# Patient Record
Sex: Male | Born: 1950 | Race: White | Hispanic: No | Marital: Married | Smoking: Former smoker
Health system: Southern US, Community
[De-identification: ages and names within clinical notes are randomized; demographics above are authoritative.]

## PROBLEM LIST (undated history)

## (undated) DIAGNOSIS — C4492 Squamous cell carcinoma of skin, unspecified: Secondary | ICD-10-CM

## (undated) DIAGNOSIS — K2210 Ulcer of esophagus without bleeding: Secondary | ICD-10-CM

## (undated) DIAGNOSIS — M1990 Unspecified osteoarthritis, unspecified site: Secondary | ICD-10-CM

## (undated) DIAGNOSIS — K5790 Diverticulosis of intestine, part unspecified, without perforation or abscess without bleeding: Secondary | ICD-10-CM

## (undated) DIAGNOSIS — I440 Atrioventricular block, first degree: Secondary | ICD-10-CM

## (undated) DIAGNOSIS — E785 Hyperlipidemia, unspecified: Secondary | ICD-10-CM

## (undated) DIAGNOSIS — G4730 Sleep apnea, unspecified: Secondary | ICD-10-CM

## (undated) DIAGNOSIS — K219 Gastro-esophageal reflux disease without esophagitis: Secondary | ICD-10-CM

## (undated) DIAGNOSIS — G5139 Clonic hemifacial spasm, unspecified: Secondary | ICD-10-CM

## (undated) DIAGNOSIS — Q211 Atrial septal defect: Secondary | ICD-10-CM

## (undated) DIAGNOSIS — K648 Other hemorrhoids: Secondary | ICD-10-CM

## (undated) DIAGNOSIS — N400 Enlarged prostate without lower urinary tract symptoms: Secondary | ICD-10-CM

## (undated) DIAGNOSIS — Q2112 Patent foramen ovale: Secondary | ICD-10-CM

## (undated) DIAGNOSIS — Z860100 Personal history of colon polyps, unspecified: Secondary | ICD-10-CM

## (undated) DIAGNOSIS — K222 Esophageal obstruction: Secondary | ICD-10-CM

## (undated) DIAGNOSIS — C61 Malignant neoplasm of prostate: Secondary | ICD-10-CM

## (undated) DIAGNOSIS — Z9889 Other specified postprocedural states: Secondary | ICD-10-CM

## (undated) HISTORY — PX: COLONOSCOPY W/ POLYPECTOMY: SHX1380

## (undated) HISTORY — DX: Squamous cell carcinoma of skin, unspecified: C44.92

## (undated) HISTORY — DX: Ulcer of esophagus without bleeding: K22.10

## (undated) HISTORY — DX: Personal history of colon polyps, unspecified: Z86.0100

## (undated) HISTORY — DX: Hyperlipidemia, unspecified: E78.5

## (undated) HISTORY — DX: Gastro-esophageal reflux disease without esophagitis: K21.9

## (undated) HISTORY — DX: Diverticulosis of intestine, part unspecified, without perforation or abscess without bleeding: K57.90

## (undated) HISTORY — PX: MOHS SURGERY: SUR867

## (undated) HISTORY — DX: Esophageal obstruction: K22.2

## (undated) HISTORY — DX: Sleep apnea, unspecified: G47.30

## (undated) HISTORY — PX: ROTATOR CUFF REPAIR: SHX139

## (undated) HISTORY — DX: Benign prostatic hyperplasia without lower urinary tract symptoms: N40.0

## (undated) HISTORY — PX: NASAL SINUS SURGERY: SHX719

## (undated) HISTORY — DX: Personal history of colonic polyps: Z86.010

## (undated) HISTORY — PX: OTHER SURGICAL HISTORY: SHX169

## (undated) HISTORY — DX: Other hemorrhoids: K64.8

---
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
## 2001-11-23 ENCOUNTER — Encounter: Payer: Self-pay | Admitting: Orthopedic Surgery

## 2001-11-23 ENCOUNTER — Emergency Department (HOSPITAL_COMMUNITY): Admission: EM | Admit: 2001-11-23 | Discharge: 2001-11-23 | Payer: Self-pay | Admitting: Emergency Medicine

## 2002-12-01 ENCOUNTER — Encounter (INDEPENDENT_AMBULATORY_CARE_PROVIDER_SITE_OTHER): Payer: Self-pay | Admitting: *Deleted

## 2002-12-01 ENCOUNTER — Ambulatory Visit (HOSPITAL_BASED_OUTPATIENT_CLINIC_OR_DEPARTMENT_OTHER): Admission: RE | Admit: 2002-12-01 | Discharge: 2002-12-01 | Payer: Self-pay | Admitting: Orthopedic Surgery

## 2003-08-20 ENCOUNTER — Encounter: Payer: Self-pay | Admitting: Internal Medicine

## 2003-11-17 ENCOUNTER — Encounter: Admission: RE | Admit: 2003-11-17 | Discharge: 2003-11-17 | Payer: Self-pay | Admitting: Internal Medicine

## 2004-04-12 ENCOUNTER — Ambulatory Visit (HOSPITAL_COMMUNITY): Admission: RE | Admit: 2004-04-12 | Discharge: 2004-04-12 | Payer: Self-pay | Admitting: Orthopedic Surgery

## 2004-08-03 ENCOUNTER — Ambulatory Visit (HOSPITAL_BASED_OUTPATIENT_CLINIC_OR_DEPARTMENT_OTHER): Admission: RE | Admit: 2004-08-03 | Discharge: 2004-08-03 | Payer: Self-pay | Admitting: Orthopedic Surgery

## 2005-02-08 ENCOUNTER — Ambulatory Visit: Payer: Self-pay | Admitting: Internal Medicine

## 2005-03-01 ENCOUNTER — Ambulatory Visit: Payer: Self-pay | Admitting: Internal Medicine

## 2005-04-04 ENCOUNTER — Ambulatory Visit (HOSPITAL_BASED_OUTPATIENT_CLINIC_OR_DEPARTMENT_OTHER): Admission: RE | Admit: 2005-04-04 | Discharge: 2005-04-04 | Payer: Self-pay | Admitting: Internal Medicine

## 2005-04-04 ENCOUNTER — Encounter: Payer: Self-pay | Admitting: Internal Medicine

## 2005-04-16 ENCOUNTER — Ambulatory Visit: Payer: Self-pay | Admitting: Pulmonary Disease

## 2005-05-04 ENCOUNTER — Ambulatory Visit: Payer: Self-pay | Admitting: Internal Medicine

## 2005-06-06 ENCOUNTER — Ambulatory Visit: Payer: Self-pay | Admitting: Internal Medicine

## 2005-08-09 ENCOUNTER — Ambulatory Visit: Payer: Self-pay | Admitting: Internal Medicine

## 2005-08-20 ENCOUNTER — Ambulatory Visit (HOSPITAL_COMMUNITY): Admission: RE | Admit: 2005-08-20 | Discharge: 2005-08-20 | Payer: Self-pay | Admitting: Orthopedic Surgery

## 2005-09-14 ENCOUNTER — Ambulatory Visit: Payer: Self-pay | Admitting: Internal Medicine

## 2006-05-03 ENCOUNTER — Ambulatory Visit: Payer: Self-pay | Admitting: Internal Medicine

## 2006-06-25 ENCOUNTER — Ambulatory Visit: Payer: Self-pay | Admitting: Internal Medicine

## 2006-09-02 ENCOUNTER — Ambulatory Visit: Payer: Self-pay | Admitting: Internal Medicine

## 2006-10-08 HISTORY — PX: COLONOSCOPY W/ POLYPECTOMY: SHX1380

## 2006-10-08 HISTORY — PX: OTHER SURGICAL HISTORY: SHX169

## 2006-12-11 ENCOUNTER — Ambulatory Visit: Payer: Self-pay | Admitting: Internal Medicine

## 2007-02-05 ENCOUNTER — Ambulatory Visit: Payer: Self-pay | Admitting: Internal Medicine

## 2007-02-05 ENCOUNTER — Encounter: Payer: Self-pay | Admitting: Internal Medicine

## 2007-02-12 ENCOUNTER — Ambulatory Visit (HOSPITAL_COMMUNITY): Admission: RE | Admit: 2007-02-12 | Discharge: 2007-02-13 | Payer: Self-pay | Admitting: Otolaryngology

## 2007-02-12 ENCOUNTER — Encounter (INDEPENDENT_AMBULATORY_CARE_PROVIDER_SITE_OTHER): Payer: Self-pay | Admitting: Specialist

## 2007-05-08 ENCOUNTER — Encounter: Payer: Self-pay | Admitting: Internal Medicine

## 2007-05-20 ENCOUNTER — Encounter: Payer: Self-pay | Admitting: Internal Medicine

## 2007-06-23 ENCOUNTER — Ambulatory Visit (HOSPITAL_BASED_OUTPATIENT_CLINIC_OR_DEPARTMENT_OTHER): Admission: RE | Admit: 2007-06-23 | Discharge: 2007-06-23 | Payer: Self-pay | Admitting: Otolaryngology

## 2007-07-01 ENCOUNTER — Ambulatory Visit: Payer: Self-pay | Admitting: Internal Medicine

## 2007-07-15 DIAGNOSIS — G4730 Sleep apnea, unspecified: Secondary | ICD-10-CM | POA: Insufficient documentation

## 2007-07-24 ENCOUNTER — Ambulatory Visit: Payer: Self-pay | Admitting: Internal Medicine

## 2007-07-24 DIAGNOSIS — G5131 Clonic hemifacial spasm, right: Secondary | ICD-10-CM | POA: Insufficient documentation

## 2007-07-24 DIAGNOSIS — Z85828 Personal history of other malignant neoplasm of skin: Secondary | ICD-10-CM | POA: Insufficient documentation

## 2007-08-05 ENCOUNTER — Encounter (INDEPENDENT_AMBULATORY_CARE_PROVIDER_SITE_OTHER): Payer: Self-pay | Admitting: *Deleted

## 2007-08-15 ENCOUNTER — Telehealth: Payer: Self-pay | Admitting: Internal Medicine

## 2007-08-26 ENCOUNTER — Ambulatory Visit: Payer: Self-pay | Admitting: Internal Medicine

## 2007-08-26 DIAGNOSIS — E782 Mixed hyperlipidemia: Secondary | ICD-10-CM | POA: Insufficient documentation

## 2007-08-26 DIAGNOSIS — R7989 Other specified abnormal findings of blood chemistry: Secondary | ICD-10-CM | POA: Insufficient documentation

## 2007-08-26 LAB — CONVERTED CEMR LAB
Cholesterol, target level: 200 mg/dL
HDL goal, serum: 40 mg/dL
LDL Goal: 160 mg/dL

## 2007-08-27 ENCOUNTER — Encounter: Payer: Self-pay | Admitting: Internal Medicine

## 2007-08-28 ENCOUNTER — Encounter (INDEPENDENT_AMBULATORY_CARE_PROVIDER_SITE_OTHER): Payer: Self-pay | Admitting: *Deleted

## 2007-08-28 LAB — CONVERTED CEMR LAB: Hgb A1c MFr Bld: 5.4 % (ref 4.6–6.0)

## 2007-09-23 ENCOUNTER — Encounter: Payer: Self-pay | Admitting: Internal Medicine

## 2007-11-06 ENCOUNTER — Ambulatory Visit: Payer: Self-pay | Admitting: Internal Medicine

## 2007-11-19 ENCOUNTER — Encounter (INDEPENDENT_AMBULATORY_CARE_PROVIDER_SITE_OTHER): Payer: Self-pay | Admitting: *Deleted

## 2007-11-19 LAB — CONVERTED CEMR LAB
ALT: 22 units/L (ref 0–53)
AST: 24 units/L (ref 0–37)
Albumin: 4.1 g/dL (ref 3.5–5.2)
Alkaline Phosphatase: 51 units/L (ref 39–117)
Bilirubin, Direct: 0.2 mg/dL (ref 0.0–0.3)
Cholesterol: 181 mg/dL (ref 0–200)
HDL: 67.8 mg/dL (ref >39.0)
LDL Cholesterol: 103 mg/dL — ABNORMAL HIGH (ref 0–99)
Total Bilirubin: 1.1 mg/dL (ref 0.3–1.2)
Total CHOL/HDL Ratio: 2.7
Total Protein: 7 g/dL (ref 6.0–8.3)
Triglycerides: 50 mg/dL (ref 0–149)
VLDL: 10 mg/dL (ref 0–40)

## 2007-12-04 ENCOUNTER — Telehealth (INDEPENDENT_AMBULATORY_CARE_PROVIDER_SITE_OTHER): Payer: Self-pay | Admitting: *Deleted

## 2008-05-26 ENCOUNTER — Ambulatory Visit: Payer: Self-pay | Admitting: Internal Medicine

## 2008-05-31 ENCOUNTER — Encounter (INDEPENDENT_AMBULATORY_CARE_PROVIDER_SITE_OTHER): Payer: Self-pay | Admitting: *Deleted

## 2008-05-31 LAB — CONVERTED CEMR LAB
ALT: 23 units/L (ref 0–53)
AST: 28 units/L (ref 0–37)
Albumin: 4 g/dL (ref 3.5–5.2)
Alkaline Phosphatase: 46 units/L (ref 39–117)
Bilirubin, Direct: 0.1 mg/dL (ref 0.0–0.3)
Cholesterol: 181 mg/dL (ref 0–200)
HDL: 55.8 mg/dL (ref >39.0)
LDL Cholesterol: 114 mg/dL — ABNORMAL HIGH (ref 0–99)
Total Bilirubin: 1.1 mg/dL (ref 0.3–1.2)
Total CHOL/HDL Ratio: 3.2
Total Protein: 6.8 g/dL (ref 6.0–8.3)
Triglycerides: 55 mg/dL (ref 0–149)
VLDL: 11 mg/dL (ref 0–40)

## 2008-06-18 ENCOUNTER — Ambulatory Visit: Payer: Self-pay | Admitting: Internal Medicine

## 2008-06-18 DIAGNOSIS — Z86010 Personal history of colonic polyps: Secondary | ICD-10-CM | POA: Insufficient documentation

## 2008-06-18 LAB — CONVERTED CEMR LAB
Cholesterol, target level: 200 mg/dL
HDL goal, serum: 40 mg/dL
LDL Goal: 100 mg/dL

## 2009-01-27 ENCOUNTER — Telehealth (INDEPENDENT_AMBULATORY_CARE_PROVIDER_SITE_OTHER): Payer: Self-pay | Admitting: *Deleted

## 2009-02-21 ENCOUNTER — Ambulatory Visit: Payer: Self-pay | Admitting: Internal Medicine

## 2009-02-21 LAB — CONVERTED CEMR LAB
ALT: 22 units/L (ref 0–53)
AST: 29 units/L (ref 0–37)
Albumin: 4 g/dL (ref 3.5–5.2)
Alkaline Phosphatase: 47 units/L (ref 39–117)
BUN: 13 mg/dL (ref 6–23)
Basophils Absolute: 0 10*3/uL (ref 0.0–0.1)
Basophils Relative: 0.3 % (ref 0.0–3.0)
Bilirubin, Direct: 0.1 mg/dL (ref 0.0–0.3)
CO2: 28 meq/L (ref 19–32)
Calcium: 8.6 mg/dL (ref 8.4–10.5)
Chloride: 110 meq/L (ref 96–112)
Cholesterol: 138 mg/dL (ref 0–200)
Creatinine, Ser: 0.9 mg/dL (ref 0.4–1.5)
Eosinophils Absolute: 0.1 10*3/uL (ref 0.0–0.7)
Eosinophils Relative: 2.1 % (ref 0.0–5.0)
GFR calc non Af Amer: 91.76 mL/min (ref >60)
Glucose, Bld: 91 mg/dL (ref 70–99)
HCT: 43.3 % (ref 39.0–52.0)
HDL: 53.9 mg/dL (ref >39.00)
Hemoglobin: 15.1 g/dL (ref 13.0–17.0)
LDL Cholesterol: 74 mg/dL (ref 0–99)
Lymphocytes Relative: 23.3 % (ref 12.0–46.0)
Lymphs Abs: 1.1 10*3/uL (ref 0.7–4.0)
MCHC: 34.9 g/dL (ref 30.0–36.0)
MCV: 95.6 fL (ref 78.0–100.0)
Monocytes Absolute: 0.4 10*3/uL (ref 0.1–1.0)
Monocytes Relative: 9.2 % (ref 3.0–12.0)
Neutro Abs: 3.1 10*3/uL (ref 1.4–7.7)
Neutrophils Relative %: 65.1 % (ref 43.0–77.0)
PSA: 2.08 ng/mL (ref 0.10–4.00)
Platelets: 147 10*3/uL — ABNORMAL LOW (ref 150.0–400.0)
Potassium: 3.9 meq/L (ref 3.5–5.1)
RBC: 4.53 M/uL (ref 4.22–5.81)
RDW: 12.1 % (ref 11.5–14.6)
Sodium: 141 meq/L (ref 135–145)
TSH: 1.29 microintl units/mL (ref 0.35–5.50)
Testosterone: 426.84 ng/dL (ref 350.00–890.00)
Total Bilirubin: 0.9 mg/dL (ref 0.3–1.2)
Total CHOL/HDL Ratio: 3
Total Protein: 6.6 g/dL (ref 6.0–8.3)
Triglycerides: 50 mg/dL (ref 0.0–149.0)
VLDL: 10 mg/dL (ref 0.0–40.0)
Vit D, 25-Hydroxy: 33 ng/mL (ref 30–89)
WBC: 4.7 10*3/uL (ref 4.5–10.5)

## 2009-02-28 ENCOUNTER — Ambulatory Visit: Payer: Self-pay | Admitting: Internal Medicine

## 2009-02-28 DIAGNOSIS — I440 Atrioventricular block, first degree: Secondary | ICD-10-CM | POA: Insufficient documentation

## 2009-03-08 ENCOUNTER — Ambulatory Visit: Payer: Self-pay | Admitting: Internal Medicine

## 2009-03-08 ENCOUNTER — Encounter (INDEPENDENT_AMBULATORY_CARE_PROVIDER_SITE_OTHER): Payer: Self-pay | Admitting: *Deleted

## 2009-03-08 LAB — CONVERTED CEMR LAB
OCCULT 1: NEGATIVE
OCCULT 2: NEGATIVE
OCCULT 3: NEGATIVE

## 2009-04-26 ENCOUNTER — Encounter: Payer: Self-pay | Admitting: Internal Medicine

## 2010-04-06 ENCOUNTER — Telehealth (INDEPENDENT_AMBULATORY_CARE_PROVIDER_SITE_OTHER): Payer: Self-pay | Admitting: *Deleted

## 2010-05-31 ENCOUNTER — Encounter: Payer: Self-pay | Admitting: Internal Medicine

## 2010-06-07 ENCOUNTER — Ambulatory Visit: Payer: Self-pay | Admitting: Internal Medicine

## 2010-06-07 DIAGNOSIS — R6882 Decreased libido: Secondary | ICD-10-CM | POA: Insufficient documentation

## 2010-06-07 DIAGNOSIS — E559 Vitamin D deficiency, unspecified: Secondary | ICD-10-CM | POA: Insufficient documentation

## 2010-06-07 DIAGNOSIS — N400 Enlarged prostate without lower urinary tract symptoms: Secondary | ICD-10-CM | POA: Insufficient documentation

## 2010-06-14 ENCOUNTER — Ambulatory Visit: Payer: Self-pay | Admitting: Internal Medicine

## 2010-06-16 LAB — CONVERTED CEMR LAB
ALT: 30 units/L (ref 0–53)
AST: 27 units/L (ref 0–37)
Albumin: 4.1 g/dL (ref 3.5–5.2)
Alkaline Phosphatase: 45 units/L (ref 39–117)
BUN: 15 mg/dL (ref 6–23)
Basophils Absolute: 0 10*3/uL (ref 0.0–0.1)
Basophils Relative: 0.6 % (ref 0.0–3.0)
Bilirubin, Direct: 0.2 mg/dL (ref 0.0–0.3)
CO2: 27 meq/L (ref 19–32)
Calcium: 8.7 mg/dL (ref 8.4–10.5)
Chloride: 103 meq/L (ref 96–112)
Cholesterol: 163 mg/dL (ref 0–200)
Creatinine, Ser: 0.9 mg/dL (ref 0.4–1.5)
Eosinophils Absolute: 0.1 10*3/uL (ref 0.0–0.7)
Eosinophils Relative: 2.2 % (ref 0.0–5.0)
GFR calc non Af Amer: 87.96 mL/min (ref >60)
Glucose, Bld: 88 mg/dL (ref 70–99)
HCT: 44.5 % (ref 39.0–52.0)
HDL: 55.1 mg/dL (ref >39.00)
Hemoglobin: 15.5 g/dL (ref 13.0–17.0)
LDL Cholesterol: 94 mg/dL (ref 0–99)
Lymphocytes Relative: 28 % (ref 12.0–46.0)
Lymphs Abs: 1.5 10*3/uL (ref 0.7–4.0)
MCHC: 34.9 g/dL (ref 30.0–36.0)
MCV: 95.6 fL (ref 78.0–100.0)
Monocytes Absolute: 0.5 10*3/uL (ref 0.1–1.0)
Monocytes Relative: 9.9 % (ref 3.0–12.0)
Neutro Abs: 3.1 10*3/uL (ref 1.4–7.7)
Neutrophils Relative %: 59.3 % (ref 43.0–77.0)
PSA: 2.67 ng/mL (ref 0.10–4.00)
Platelets: 177 10*3/uL (ref 150.0–400.0)
Potassium: 5 meq/L (ref 3.5–5.1)
RBC: 4.65 M/uL (ref 4.22–5.81)
RDW: 12.4 % (ref 11.5–14.6)
Sodium: 138 meq/L (ref 135–145)
TSH: 1.57 microintl units/mL (ref 0.35–5.50)
Testosterone: 488.27 ng/dL (ref 350.00–890.00)
Total Bilirubin: 0.9 mg/dL (ref 0.3–1.2)
Total CHOL/HDL Ratio: 3
Total Protein: 6.4 g/dL (ref 6.0–8.3)
Triglycerides: 71 mg/dL (ref 0.0–149.0)
VLDL: 14.2 mg/dL (ref 0.0–40.0)
Vit D, 25-Hydroxy: 34 ng/mL (ref 30–89)
WBC: 5.2 10*3/uL (ref 4.5–10.5)

## 2010-11-05 LAB — CONVERTED CEMR LAB
ALT: 25 units/L (ref 0–53)
AST: 27 units/L (ref 0–37)
Albumin: 4.6 g/dL (ref 3.5–5.2)
Alkaline Phosphatase: 57 units/L (ref 39–117)
BUN: 10 mg/dL (ref 6–23)
Basophils Absolute: 0 10*3/uL (ref 0.0–0.1)
Basophils Relative: 0.7 % (ref 0.0–1.0)
Bilirubin, Direct: 0.1 mg/dL (ref 0.0–0.3)
CO2: 29 meq/L (ref 19–32)
Calcium: 9.7 mg/dL (ref 8.4–10.5)
Chloride: 105 meq/L (ref 96–112)
Cholesterol: 256 mg/dL (ref 0–200)
Creatinine, Ser: 1.2 mg/dL (ref 0.4–1.5)
Direct LDL: 172 mg/dL
Eosinophils Absolute: 0.1 10*3/uL (ref 0.0–0.6)
Eosinophils Relative: 1.6 % (ref 0.0–5.0)
GFR calc Af Amer: 80 mL/min
GFR calc non Af Amer: 66 mL/min
Glucose, Bld: 108 mg/dL — ABNORMAL HIGH (ref 70–99)
HCT: 49.3 % (ref 39.0–52.0)
HDL: 72.1 mg/dL (ref >39.0)
Hemoglobin: 17.1 g/dL — ABNORMAL HIGH (ref 13.0–17.0)
LDL Goal: 110 mg/dL
Lymphocytes Relative: 24.4 % (ref 12.0–46.0)
MCHC: 34.7 g/dL (ref 30.0–36.0)
MCV: 94.7 fL (ref 78.0–100.0)
Monocytes Absolute: 0.5 10*3/uL (ref 0.2–0.7)
Monocytes Relative: 8.8 % (ref 3.0–11.0)
Neutro Abs: 3.8 10*3/uL (ref 1.4–7.7)
Neutrophils Relative %: 64.5 % (ref 43.0–77.0)
Platelets: 182 10*3/uL (ref 150–400)
Potassium: 4.4 meq/L (ref 3.5–5.1)
RBC: 5.21 M/uL (ref 4.22–5.81)
RDW: 11.9 % (ref 11.5–14.6)
Sodium: 142 meq/L (ref 135–145)
TSH: 1.6 microintl units/mL (ref 0.35–5.50)
Total Bilirubin: 1.5 mg/dL — ABNORMAL HIGH (ref 0.3–1.2)
Total CHOL/HDL Ratio: 3.6
Total Protein: 7.7 g/dL (ref 6.0–8.3)
Triglycerides: 62 mg/dL (ref 0–149)
VLDL: 12 mg/dL (ref 0–40)
WBC: 5.8 10*3/uL (ref 4.5–10.5)

## 2010-11-09 NOTE — Assessment & Plan Note (Signed)
Summary: roa per lab append [id number]/cbs   Vital Signs:  Patient Profile:   60 Years Old Male Weight:      193 pounds Pulse rate:   61 / minute Resp:     12 per minute BP sitting:   126 / 80  (left arm) Cuff size:   large  Vitals Entered By: Shonna Chock (June 18, 2008 3:38 PM)                 Chief Complaint:  FOLLOW-UP ON LABS.  History of Present Illness: Lipids & NMR reviewed ; LDL goal = < 100. [place] "heart healthy " diet. CVE 4-5X/week for 40-90 min w/o symptoms.  Lipid Management History:      Positive NCEP/ATP III risk factors include male age 39 years old or older.  Negative NCEP/ATP III risk factors include non-diabetic, no family history for ischemic heart disease, non-tobacco-user status, non-hypertensive, no ASHD (atherosclerotic heart disease), no prior stroke/TIA, no peripheral vascular disease, and no history of aortic aneurysm.       Current Allergies (reviewed today): No known allergies   Past Medical History:    Facial tremor/tic Botox q 3 months @ [facility] ;  sleep apnea     Skin cancer, hx of squamous cell CA    Colonic polyps, hx of  Past Surgical History:    Reviewed history from 07/24/2007 and no changes required:       Surgery for Sleep apnea Dr Annalee Genta       Sinus surgery, uvulectomy &       Tonsillectomy 2008       Torn biceps       Rotator cuff repair       Colon polypectomy X 2   Family History:    Family History Diabetes 1st degree relative, daughter    Family History Hypertension    Family History Ovarian cancer, HTN    Father: prostate CA, CVAs, on coumadin (onset in 53s)    Mother: ovarian & breast CA    Siblings: neg    Review of Systems  CV      Denies chest pain or discomfort, leg cramps with exertion, and shortness of breath with exertion.   Physical Exam  General:     well-nourished,in no acute distress; alert,appropriate and cooperative throughout examination Heart:     Normal rate and regular rhythm. S1   normal without gallop, murmur, click, rub . S2 accentuated Pulses:     R and L carotid,radial,dorsalis pedis and posterior tibial pulses are full and equal bilaterally Psych:     memory intact for recent and remote, normally interactive, and good eye contact.  Focused & intelligent    Impression & Recommendations:  Problem # 1:  HYPERLIPIDEMIA (ICD-272.2)  The following medications were removed from the medication list:    Pravastatin Sodium 40 Mg Tabs (Pravastatin sodium) .Marland Kitchen... 1 qhs  His updated medication list for this problem includes:    Simvastatin 40 Mg Tabs (Simvastatin) .Marland Kitchen... 1 at bedtime   Complete Medication List: 1)  Adult Aspirin Low Strength 81 Mg Tbdp (Aspirin) .Marland Kitchen.. 1 by mouth once daily 2)  Simvastatin 40 Mg Tabs (Simvastatin) .Marland Kitchen.. 1 at bedtime  Lipid Assessment/Plan:      Based on NCEP/ATP III, the patient's risk factor category is "0-1 risk factors".  The patient's lipid goals have been set as follows: Total cholesterol goal is 200; LDL cholesterol goal is 100; HDL cholesterol goal is 40; Triglyceride goal is 150.  His LDL cholesterol goal has not been met.  Secondary causes for hyperlipidemia have been ruled out.  He has been counseled on adjunctive measures for lowering his cholesterol and has been provided with dietary instructions.     Patient Instructions: 1)  Please schedule a follow-up appointment in 3 months. 2)  Hepatic Panel prior to visit, ICD-9: 995.20 3)  Lipid Panel prior to visit, ICD-9:272.4   Prescriptions: SIMVASTATIN 40 MG TABS (SIMVASTATIN) 1 at bedtime  #90 x 1   Entered and Authorized by:   Marga Melnick MD   Signed by:   Marga Melnick MD on 06/18/2008   Method used:   Print then Give to Patient   RxID:   [id number]  ]

## 2010-11-09 NOTE — Letter (Signed)
Summary: External Correspondence  External Correspondence   Imported By: Doristine Devoid 07/07/2007 16:16:53  _____________________________________________________________________  External Attachment:    Type:   Image     Comment:   External Document

## 2010-11-09 NOTE — Letter (Signed)
Summary: Results Follow up Letter  [facility] [place]  [address] [place], [place] [address]   Phone: [phone]  Fax: [phone]    05/31/2008 MRN: [id number]  Steve [name] [address] [place], [place]  [address]  Dear [name],  The following are the results of your recent test(s):  Test         Result    Pap Smear:        Normal _____  Not Normal _____ Comments: ______________________________________________________ Cholesterol: LDL(Bad cholesterol):         Your goal is less than:         HDL (Good cholesterol):       Your goal is more than: Comments:  ______________________________________________________ Mammogram:        Normal _____  Not Normal _____ Comments:  ___________________________________________________________________ Hemoccult:        Normal _____  Not normal _______ Comments:    _____________________________________________________________________ Other Tests:  PLEASE SEE ATTACHED LABS AND COMMENTS  We routinely do not discuss normal results over the telephone.  If you desire a copy of the results, or you have any questions about this information we can discuss them at your next office visit.   Sincerely,

## 2010-11-09 NOTE — Letter (Signed)
Summary: Results Follow up Letter   at [facility]  [address] [place], [place] [address]   Phone: [phone]  Fax: [phone]    03/08/2009 MRN: [id number]  TOMER CHALMERS [address] [place], [place]  [address]  Dear Mr. CLUBB,  The following are the results of your recent test(s):  Test         Result    Pap Smear:        Normal _____  Not Normal _____ Comments: ______________________________________________________ Cholesterol: LDL(Bad cholesterol):         Your goal is less than:         HDL (Good cholesterol):       Your goal is more than: Comments:  ______________________________________________________ Mammogram:        Normal _____  Not Normal _____ Comments:  ___________________________________________________________________ Hemoccult:        Normal _X____  Not normal _______ Comments:    _____________________________________________________________________ Other Tests:    We routinely do not discuss normal results over the telephone.  If you desire a copy of the results, or you have any questions about this information we can discuss them at your next office visit.   Sincerely,

## 2010-11-09 NOTE — Letter (Signed)
Summary: Results Follow up Letter  [facility]  [address] [place], [place] [address]   Phone: [phone]  Fax: [phone]    08/05/2007 MRN: [id number]  SUMNER KIRCHMAN [address] [place], [place]  [address]  Dear Mr. TEGTMEYER,  The following are the results of your recent test(s):  Test         Result    Pap Smear:        Normal _____  Not Normal _____ Comments: ______________________________________________________ Cholesterol: LDL(Bad cholesterol):         Your goal is less than:         HDL (Good cholesterol):       Your goal is more than: Comments:  ______________________________________________________ Mammogram:        Normal _____  Not Normal _____ Comments:  ___________________________________________________________________ Hemoccult:        Normal _____  Not normal _______ Comments:    _____________________________________________________________________ Other Tests:  Please see attached results and comments   We routinely do not discuss normal results over the telephone.  If you desire a copy of the results, or you have any questions about this information we can discuss them at your next office visit.   Sincerely,

## 2010-11-09 NOTE — Assessment & Plan Note (Signed)
Summary: cpx & lab/cbs   Vital Signs:  Patient profile:   60 year old male Height:      74.5 inches Weight:      193.6 pounds BMI:     24.61 Temp:     98.0 degrees F oral Pulse rate:   80 / minute Resp:     14 per minute BP sitting:   114 / 68  (left arm) Cuff size:   large  Vitals Entered By: Shonna Chock (Feb 28, 2009 8:45 AM)  CC: CPX AND DISCUSS FASTING LABS (COPY GIVEN), General Medical Evaluation   CC:  CPX AND DISCUSS FASTING LABS (COPY GIVEN) and General Medical Evaluation.  History of Present Illness: He has aching in  knees  with mobilization; he questions  relationship to Simvastatin 40 mg  @ bedtime. Labs reviewed : vit D 33(no supplement); all others WNL.  Preventive Screening-Counseling & Management     Smoking Status: quit     Does Patient Exercise: yes  Allergies (verified): No Known Drug Allergies  Past History:  Past Medical History:    Facial tremor/tic Botox every 3 months @ [facility], Dr Carilyn Goodpasture ;  sleep apnea ,resolved S/P ENT surgery    Skin cancer, hx of squamous cell CA,Dr Lanell Matar, [place]    Colonic polyps, hx of  Past Surgical History:    Surgery for Sleep apnea Dr Annalee Genta    Sinus surgery (Septoplasty ), uvulectomy &    Tonsillectomy 2008    Torn biceps,Dr Sypher    Rotator cuff repair, Dr Teressa Senter    Colon polypectomy X 2,Dr Marina Goodell, due 2013  Family History:    Family History Diabetes 1st degree relative, daughter    Family History Hypertension    Family History Ovarian cancer, HTN    Father: prostate CA, CVAs, on coumadin (onset in 9s)    Mother: ovarian & breast CA    Siblings: neg; MGF MI in 71s; P uncle CVA in 44s  Social History:    no diet    Occupation: Clinical biochemist [employer]    Married    Former Smoker:1983    Alcohol use-yes: socially    Regular exercise-yes:4X/week CVE ,trainer  Review of Systems  The patient denies anorexia, fever, weight loss, weight gain, vision loss, decreased hearing, hoarseness, chest pain,  syncope, dyspnea on exertion, peripheral edema, prolonged cough, headaches, hemoptysis, abdominal pain, melena, hematochezia, severe indigestion/heartburn, hematuria, suspicious skin lesions, depression, unusual weight change, abnormal bleeding, enlarged lymph nodes, and angioedema.         Night sweats intermittently past year; improving w/o Rx. MS:  See HPI; Complains of joint pain, muscle weakness, and stiffness; denies joint redness, joint swelling, loss of strength, low back pain, muscle aches, and thoracic pain; R biceps weak; knees & LS spine stiff.  Physical Exam  General:  well-nourished,in no acute distress; alert,appropriate and cooperative throughout examination Head:  Normocephalic and atraumatic without obvious abnormalities. No apparent alopecia. Eyes:  No corneal or conjunctival inflammation noted. Ptosis OD. Perrla. Funduscopic exam benign, without hemorrhages, exudates or papilledema.  Ears:  External ear exam shows no significant lesions or deformities.  Otoscopic examination reveals clear canals, tympanic membranes are intact bilaterally without bulging, retraction, inflammation or discharge. Hearing is grossly normal bilaterally. Nose:  External nasal examination shows no deformity or inflammation. Nasal mucosa are pink and moist without lesions or exudates.Slight septal deviation Mouth:  Oral mucosa and oropharynx without lesions or exudates.  Teeth in good repair.S/P unvulectomy Neck:  No deformities, masses, or tenderness noted. Lungs:  Normal respiratory effort, chest expands symmetrically. Lungs are clear to auscultation, no crackles or wheezes. Heart:  Normal rate and regular rhythm. S1 and S2 normal without gallop, murmur, click, rub .S4 Abdomen:  Bowel sounds positive,abdomen soft and non-tender without masses, organomegaly or hernias noted. Rectal:  Stool cards given Genitalia:  Dr Retta Diones Prostate:  Dr Retta Diones seen annually (appt pending) Msk:  No deformity or  scoliosis noted of thoracic or lumbar spine.   Pulses:  R and L carotid,radial,dorsalis pedis and posterior tibial pulses are full and equal bilaterally Extremities:  No clubbing, cyanosis, edema, or deformity noted with normal full range of motion of all joints.   Minor crepitus knees Neurologic:  alert & oriented X3 and DTRs symmetrical and normal.   Skin:  Intact without suspicious lesions or rashes Cervical Nodes:  No lymphadenopathy noted Axillary Nodes:  No palpable lymphadenopathy Psych:  memory intact for recent and remote, normally interactive, and good eye contact.     Impression & Recommendations:  Problem # 1:  ROUTINE GENERAL MEDICAL EXAM@HEALTH  CARE FACL (ICD-V70.0)  Orders: EKG w/ Interpretation (93000)  Problem # 2:  HYPERLIPIDEMIA (ICD-272.2)  His updated medication list for this problem includes:    Simvastatin 40 Mg Tabs (Simvastatin) .Marland Kitchen... 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 1 by mouth once daily 2)  Simvastatin 40 Mg Tabs (Simvastatin) .Marland Kitchen.. 1 at bedtime-needs to schedule labs  Other Orders: Tdap => 40yrs IM (04540) Admin 1st Vaccine (98119)  Patient Instructions: 1)  Vitamin D 1000 International Units once daily . Avoid excess aspirin & NSAIDS; report unusual  bruising or bleeding. Prescriptions: SIMVASTATIN 40 MG TABS (SIMVASTATIN) 1 at bedtime-NEEDS TO SCHEDULE LABS  #90 x 3   Entered and Authorized by:   Marga Melnick MD   Signed by:   Marga Melnick MD on 02/28/2009   Method used:   Print then Give to Patient   RxID:    [id number]      Tetanus/Td Vaccine    Vaccine Type: Tdap    Site: right deltoid    Mfr: GlaxoSmithKline    Dose: 0.5 ml    Route: IM    Given by: Chrae Malloy    Exp. Date: 12/01/2010    Lot #: [id number]    VIS given: 08/26/07 version given Feb 28, 2009.

## 2010-11-09 NOTE — Letter (Signed)
Summary: Results Follow up Letter  [facility]  [address] [place], [place] [address]   Phone: [phone]  Fax: [phone]    11/19/2007 MRN: [id number]  FOSTER FRERICKS [address] [place], [place]  [address]  Dear Mr. SPICKLER,  The following are the results of your recent test(s):  Test         Result    Pap Smear:        Normal _____  Not Normal _____ Comments: ______________________________________________________ Cholesterol: LDL(Bad cholesterol):         Your goal is less than:         HDL (Good cholesterol):       Your goal is more than: Comments:  ______________________________________________________ Mammogram:        Normal _____  Not Normal _____ Comments:  ___________________________________________________________________ Hemoccult:        Normal _____  Not normal _______ Comments:    _____________________________________________________________________ Other Tests:  Please see attached results and comments   We routinely do not discuss normal results over the telephone.  If you desire a copy of the results, or you have any questions about this information we can discuss them at your next office visit.   Sincerely,
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
## 2010-11-09 NOTE — Letter (Signed)
Summary: DERMATOPATHOLOGY  DERMATOPATHOLOGY   Imported By: Freddy Jaksch 08/27/2007 14:35:16  _____________________________________________________________________  External Attachment:    Type:   Image     Comment:   External Document

## 2010-11-09 NOTE — Progress Notes (Signed)
Summary: simvastatin rx  Phone Note Refill Request Message from:  Patient  Refills Requested: Medication #1:  SIMVASTATIN 40 MG TABS 1 at bedtime-NEEDS TO SCHEDULE LABS. Pt needs rx simvastatin has cpx schedule 02/28/09   Initial call taken by: Kandice Hams,  January 27, 2009 4:05 PM Caller: Patient      Prescriptions: SIMVASTATIN 40 MG TABS (SIMVASTATIN) 1 at bedtime-NEEDS TO SCHEDULE LABS  #30 x 0   Entered by:   Kandice Hams   Authorized by:   Marga Melnick MD   Signed by:   Kandice Hams on 01/27/2009   Method used:   Faxed to ...       [facility] (retail)       [address]       [place], [place]  [id number]       Ph: [phone] or [phone]       Fax: [phone]   RxID:   [id number]

## 2010-11-09 NOTE — Letter (Signed)
Summary: Results Follow up Letter  [facility] [place]  [address] [place], [place] [address]   Phone: [phone]  Fax: [phone]    08/28/2007 MRN: [id number]  [facility] [address] [place], [place]  [address]  Dear Mr. LOUX,  The following are the results of your recent test(s):  Test         Result    Pap Smear:        Normal _____  Not Normal _____ Comments: ______________________________________________________ Cholesterol: LDL(Bad cholesterol):         Your goal is less than:         HDL (Good cholesterol):       Your goal is more than: Comments:  ______________________________________________________ Mammogram:        Normal _____  Not Normal _____ Comments:  ___________________________________________________________________ Hemoccult:        Normal _____  Not normal _______ Comments:    _____________________________________________________________________ Other Tests:  Please see attached results and comments   We routinely do not discuss normal results over the telephone.  If you desire a copy of the results, or you have any questions about this information we can discuss them at your next office visit.   Sincerely,

## 2010-11-09 NOTE — Progress Notes (Signed)
Summary: REFILL FOR PRAVASTATIN  Phone Note Refill Request   Refills Requested: Medication #1:  PRAVASTATIN SODIUM 40 MG  TABS 1 qhs.   Last Refilled: 09/01/2007 RX RECEIVED VIA FAX FROM [facility] FAX IS [phone]  Initial call taken by: Job Founds,  December 04, 2007 11:45 AM  Follow-up for Phone Call        sent electronically....................................................................Marland KitchenWendall Stade  December 04, 2007 11:57 AM       Prescriptions: PRAVASTATIN SODIUM 40 MG  TABS (PRAVASTATIN SODIUM) 1 qhs  #90 x 3   Entered by:   Wendall Stade   Authorized by:   Marga Melnick MD   Signed by:   Wendall Stade on 12/04/2007   Method used:   Electronically sent to ...       [facility]       [address]       [place], [place]  [address]       Ph: [phone] or [phone]       Fax: [phone]   RxID:   [id number]

## 2010-11-09 NOTE — Progress Notes (Signed)
Summary: rx  Phone Note Call from Patient Call back at Work Phone [phone]   Caller: Patient Summary of Call: pt has an apptointment for aug 31 need refill on simvastatin 40 mg to be [facility] Initial call taken by: Freddy Jaksch,  April 06, 2010 1:20 PM  Follow-up for Phone Call        left message that rx was sent in. Army Fossa CMA  April 06, 2010 2:05 PM     Prescriptions: SIMVASTATIN 40 MG TABS (SIMVASTATIN) **NO ADDITIONAL REFILLS TO BE GIVEN WITHOUT APPOINTMENT**1 at bedtime  #30 x 0   Entered by:   Army Fossa CMA   Authorized by:   Marga Melnick MD   Signed by:   Army Fossa CMA on 04/06/2010   Method used:   Electronically to        [facility]* (retail)       [address]       [place], [place]  [id number]       Ph: [phone] or [phone]       Fax: [phone]   RxID:   [id number]

## 2010-11-09 NOTE — Letter (Signed)
Summary: [facility]  [facility]   Imported By: Lanelle Bal 05/05/2009 08:27:41  _____________________________________________________________________  External Attachment:    Type:   Image     Comment:   External Document

## 2010-11-09 NOTE — Letter (Signed)
Summary: [facility]  [facility]   Imported By: Lanelle Bal 06/13/2010 11:14:35  _____________________________________________________________________  External Attachment:    Type:   Image     Comment:   External Document

## 2010-11-09 NOTE — Assessment & Plan Note (Signed)
Summary: review lab .cbs   Vital Signs:  Patient Profile:   60 Years Old Male Weight:      190 pounds Pulse rate:   60 / minute Pulse rhythm:   regular Resp:     15 per minute BP sitting:   104 / 56  (left arm) Cuff size:   large  Pt. in pain?   no  Vitals Entered By: Wendall Stade (August 26, 2007 1:41 PM)                  Chief Complaint:  follow up labs.  History of Present Illness: NMR 11/04 revealed LDL 156with 1869 total # & 1020 small dense particles for a risk of > 15%.LDL goal = <100. Diet is low fat; CVE > 4X/week w/o C-P symptoms. See glucose; no symptoms of diabetes. His daughter has diabetes, Type 1.  Lipid Management History:      Positive NCEP/ATP III risk factors include male age 51 years old or older.  Negative NCEP/ATP III risk factors include non-diabetic, HDL cholesterol greater than 60, no family history for ischemic heart disease, non-tobacco-user status, non-hypertensive, no ASHD (atherosclerotic heart disease), no prior stroke/TIA, no peripheral vascular disease, and no history of aortic aneurysm.     Current Allergies (reviewed today): No known allergies       Physical Exam  General:     Well-developed,well-nourished,in no acute distress; alert,appropriate and cooperative throughout examination Heart:     Normal rate and regular rhythm. S1 and S2 normal without gallop, murmur, click, rub or other extra sounds. Pulses:     R and L carotid,radial,dorsalis pedis and posterior tibial pulses are full and equal bilaterally    Impression & Recommendations:  Problem # 1:  HYPERLIPIDEMIA (ICD-272.2)  Orders: TLB-A1C / Hgb A1C (Glycohemoglobin) (83036-A1C)  His updated medication list for this problem includes:    Pravastatin Sodium 40 Mg Tabs (Pravastatin sodium) .Marland Kitchen... 1 qhs   Problem # 2:  OTHER ABNORMAL BLOOD CHEMISTRY (ICD-790.6)  Orders: TLB-A1C / Hgb A1C (Glycohemoglobin) (83036-A1C)   Complete Medication List: 1)  None  2)   Pravastatin Sodium 40 Mg Tabs (Pravastatin sodium) .Marland Kitchen.. 1 qhs  Lipid Assessment/Plan:      Based on NCEP/ATP III, the patient's risk factor category is "0-1 risk factors".  From this information, the patient's calculated lipid goals are as follows: Total cholesterol goal is 200; LDL cholesterol goal is 160; HDL cholesterol goal is 40; Triglyceride goal is 150.  His LDL cholesterol goal has not been met.  Secondary causes for hyperlipidemia have been ruled out.  He has been counseled on adjunctive measures for lowering his cholesterol and has been provided with dietary instructions.     Patient Instructions: 1)  Please schedule a follow-up appointment in 3 months. ASA 81 mg daily. 2)  Hepatic Panel prior to visit, ICD-9: 995.20 3)  Lipid Panel prior to visit, ICD-9: 272.4    Prescriptions: PRAVASTATIN SODIUM 40 MG  TABS (PRAVASTATIN SODIUM) 1 qhs  #90 x 0   Entered and Authorized by:   Marga Melnick MD   Signed by:   Marga Melnick MD on 08/26/2007   Method used:   Print then Give to Patient   RxID:   [id number]  ]

## 2010-11-09 NOTE — Progress Notes (Signed)
Summary: Briann Sarchet-question about his lab  Phone Note Call from Patient Call back at Work Phone [phone]   Caller: Patient Summary of Call: pt calling wanting to speak to dr. about his labs and was told he have to make an appt to talk about his labs he insist on the doctod to call him a [phone] Initial call taken by: Freddy Jaksch,  August 15, 2007 4:08 PM  Follow-up for Phone Call        recorder reached @ work & 2 home. I left my email to facilitate communication. Follow-up by: Marga Melnick MD,  August 18, 2007 6:00 PM

## 2010-11-09 NOTE — Assessment & Plan Note (Signed)
Summary: CPX//FASTING//CYD   Vital Signs:  Patient Profile:   60 Years Old Male Weight:      185.13 pounds Pulse rate:   68 / minute Pulse rhythm:   regular Resp:     14 per minute BP sitting:   110 / 70  (left arm) Cuff size:   large  Pt. in pain?   no  Vitals Entered By: Wendall Stade (July 24, 2007 8:29 AM)                   Chief Complaint:  cpx.  General Medical HPI:        Currently he is doing well without any significant new complaints.  Current Allergies (reviewed today): No known allergies  Updated/Current Medications (including changes made in today's visit):  None   Past Medical History:    Facial treor/tic Botox q 3 months @ [facility] ;  sleep apnea     Skin cancer, hx of squamous cell CA  Past Surgical History:    Surgery for Sleep apnea Dr Annalee Genta    Sinus surgery, uvulectomy &    Tonsillectomy 2008    Torn biceps    Rotator cuff repair    Colon polypectomy X 2   Family History:    Family History Diabetes 1st degree relative, daughter    Family History Hypertension    Family History Ovarian cancer, HTN    Father: prostate CA    Mother: ovarian & breast CA    Siblings: neg  Social History:    no diet   Risk Factors:  Alcohol use:  yes    Type:  socially Exercise:  yes    Type:  4-5X/ week CVE 30-50 min w/o C-P sx   Review of Systems  General      Complains of sleep disorder and sweats.      Denies chills, fatigue, fever, loss of appetite, malaise, weakness, and weight loss.      nocturnal sweats; apnea sx controlled post op w/o CPAP now  Eyes      Denies blurring, discharge, double vision, eye irritation, eye pain, halos, itching, light sensitivity, red eye, vision loss-1 eye, and vision loss-both eyes.      Last exam 2008, NAD  ENT      Denies decreased hearing, difficulty swallowing, ear discharge, earache, hoarseness, nasal congestion, nosebleeds, postnasal drainage, ringing in ears, sinus pressure, and sore throat.    occa nasal congestion  CV      Denies bluish discoloration of lips or nails, chest pain or discomfort, difficulty breathing at night, difficulty breathing while lying down, leg cramps with exertion, palpitations, shortness of breath with exertion, and swelling of feet.  Resp      Denies cough, excessive snoring, hypersomnolence, morning headaches, shortness of breath, and sputum productive.  GI      Denies abdominal pain, bloody stools, change in bowel habits, constipation, dark tarry stools, diarrhea, indigestion, nausea, and vomiting.  GU      He saw Dr Retta Diones 8/08, PSA 2.00. Occa urgency  MS      Denies joint pain, joint redness, joint swelling, loss of strength, low back pain, mid back pain, muscle aches, muscle , cramps, muscle weakness, stiffness, and thoracic pain.  Derm      Complains of rash.      Denies changes in color of skin, changes in nail beds, dryness, flushing, hair loss, itching, lesion(s), and poor wound healing.      He sees Dr  Candance Love annually for rash; PMH subcutaneously cell CA  Neuro      Complains of tremors.      Denies difficulty with concentration, disturbances in coordination, headaches, numbness, poor balance, sensation of room spinning, and tingling.  Psych      Denies anxiety, depression, easily angered, easily tearful, and irritability.  Endo      Denies cold intolerance, excessive hunger, excessive thirst, excessive urination, heat intolerance, polyuria, and weight change.  Heme      Denies abnormal bruising and bleeding.  Allergy      Complains of itching eyes, seasonal allergies, and sneezing.      Denies hives or rash and persistent infections.   Physical Exam  General:     Well-developed,well-nourished,in no acute distress; alert,appropriate and cooperative throughout examination Head:     Normocephalic and atraumatic without obvious abnormalities. No apparent alopecia or balding. Eyes:     No corneal or conjunctival  inflammation noted.  Perrla. Funduscopic exam benign, without hemorrhages, exudates or papilledema. Intermittent tic/ptosis OD Ears:     External ear exam shows no significant lesions or deformities.  Otoscopic examination reveals clear canals, tympanic membranes are intact bilaterally without bulging, retraction, inflammation or discharge. Hearing is grossly normal bilaterally. Nose:     External nasal examination shows no deformity or inflammation. Nasal mucosa are pink and moist without lesions or exudates. Mouth:     Oral mucosa and oropharynx without lesions or exudates.  Teeth in good repair.S/P uvulectomy Neck:     No deformities, masses, or tenderness noted.Minimal asymmetry w/o nodules Lungs:     Normal respiratory effort, chest expands symmetrically. Lungs are clear to auscultation, no crackles or wheezes. Heart:     Normal rate and regular rhythm. S1 and S2 normal without gallop, murmur, click, rub or other extra sounds. Abdomen:     Bowel sounds positive,abdomen soft and non-tender without masses, organomegaly or hernias noted. Rectal:     defrred , Dr Patrici Ranks:     see above Prostate:     see above Msk:     No deformity or scoliosis noted of thoracic or lumbar spine.   Pulses:     R and L carotid,radial,dorsalis pedis and posterior tibial pulses are full and equal bilaterally Extremities:     No clubbing, cyanosis, edema, or deformity noted with normal full range of motion of all joints.   Neurologic:     alert & oriented X3, strength normal in all extremities, gait normal, and DTRs symmetrical and normal.   Skin:     Scattered irregular erythematous , sl exfoliative dermatitis esp over legs Cervical Nodes:     No lymphadenopathy noted Axillary Nodes:     No palpable lymphadenopathy Psych:     memory intact for recent and remote, normally interactive, good eye contact, not anxious appearing, and not depressed appearing.      Impression &  Recommendations:  Problem # 1:  EXAMINATION, ROUTINE MEDICAL (ICD-V70.0)  Orders: TLB-Lipid Panel (80061-LIPID) TLB-BMP (Basic Metabolic Panel-BMET) (80048-METABOL) TLB-CBC Platelet - w/Differential (85025-CBCD) TLB-Hepatic/Liver Function Pnl (80076-HEPATIC) TLB-TSH (Thyroid Stimulating Hormone) (84443-TSH)   Problem # 2:  SYMPTOM, ABNORMAL INVOLUNTARY MOVEMENT NEC (ICD-781.0)  Problem # 3:  SKIN CANCER, HX OF (ICD-V10.83)  Problem # 4:  SYMPTOM, APNEA, SLEEP NOS (ICD-780.57) resolved post surgery  Other Orders: EKG w/ Interpretation (93000)   Patient Instructions: 1)  Please schedule a follow-up appointment in 1 year. Annual stool cards. LDL goal = < 120 based on NMR  Lipoprofile.    ]

## 2010-11-09 NOTE — Letter (Signed)
Summary: External Correspondence--[facility]  External Correspondence--[facility]   Imported By: Freddy Jaksch 05/08/2007 11:22:00  _____________________________________________________________________  External Attachment:    Type:   Image     Comment:   External Document

## 2011-02-23 NOTE — Assessment & Plan Note (Signed)
[facility]                               PULMONARY OFFICE NOTE   NAME:CUNNINGHAMMagnum, Lunde                   MRN:          [id number]  DATE:05/03/2006                            DOB:          Jul 21, 1951    PROBLEMS:  1.  Obstructive sleep apnea.  2.  Perennial rhinitis.  3.  Daytime somnolence.   HISTORY:  [name] was last here in August 2006.  In the meantime, he  has seen Dr. Annalee Genta, who recommended a CPAP trial.  Mr. Farnan  recognizes he is getting drowsy driving, and his wife continues to complain  of his snoring.  His weight has not changed significantly in the past year.   MEDICATIONS:  Red yeast rice.   No medication allergies.   OBJECTIVE:  VITAL SIGNS:  Weight 191 pounds, BP 132/62, pulse regular at 73,  room air saturation 95%.  GENERAL:  This is a trim, alert man, with a right facial tic.  HEENT:  Nasal airway is clear.  Speech is clear.  Breathing is unlabored.   IMPRESSION:  Obstructive sleep apnea.  Appropriate for CPAP trial.   PLAN:  We are going to titrate CPAP to a fixed pressure, and schedule return  in 1 month, earlier p.r.n.  We again reviewed the physiology and medical  treatments available for sleep apnea.  He is given standby prescription  Ambien 10 mg, #20, to use 1 at bedtime if needed while he is adjusting to  first CPAP experience at home.                                   Clinton D. Maple Hudson, MD, FCCP, FACP   CDY/MedQ  DD:  05/06/2006  DT:  05/06/2006  Job #:  [id number]   cc:   [name]. Alwyn Ren, MD, FCCP

## 2011-02-23 NOTE — Op Note (Signed)
NAME:  Steve [name], Steve [name] NO.:  [id number]   MEDICAL RECORD NO.:  [id number]                   PATIENT TYPE:  AMB   LOCATION:  [facility]                                  FACILITY:  [facility]   PHYSICIAN:  Katy Fitch. Naaman Plummer., M.D.          DATE OF BIRTH:  1950-10-13   DATE OF PROCEDURE:  10/08/2003  DATE OF DISCHARGE:                                 OPERATIVE REPORT   PREOPERATIVE DIAGNOSIS:  Rupture of left distal biceps insertion at greater  tuberosity (traumatic).   POSTOPERATIVE DIAGNOSIS:  Rupture of left distal biceps insertion at greater  tuberosity (traumatic).   PROCEDURE:  Reconstruction of left biceps distal attachment with utilization  of a 4 x 12 mm Endo button anchor and antecubital incision technique.   SURGEON:  Katy Fitch. Sypher, M.D.   ASSISTANT:  Marveen Reeks. Dasnoit, P.A.-C.   ANESTHESIA:  General by LMA.   ANESTHESIOLOGIST:  Edwin Cap. Zoila Shutter, M.D.   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   He sustained a hard fall on a mogul hyperextending his elbow.  He felt  immediate pain and noted a deformity of his antecubital fossa.   He was seen at the [facility] which is staffed by world  class orthopedic surgeons at the [facility].   They noted an acute distal biceps rupture.   Repair at [facility] was offered, however, the patient elected to return home to  [facility] to pursue orthopedic reconstructive surgery.   He was seen in consultation on the afternoon of November 30, 2002, at which  time he was noted to have a clear biceps defect on the left and a normal  biceps on the right.  He had weakness of supination and crepitation in the  forearm due to hematoma.   We advised immediate exploration and reconstruction of his distal biceps  insertion.   We discussed various techniques including two incision technique and  possible use of an Endo button or anchor system.   After reviewing the recent literature regarding the various appliances  available for this procedure, in my judgment the Endo button represents the  strongest construct.  Therefore, we elected to proceed with Endo button  reconstructive technique at this time.   Preoperatively, the patient was advised of potential complications during or  immediately following surgery including possible neurovascular injury,  development of sepsis, possible failure of the fixation hardware, possible  pathologic fracture of the radius and potential late complication of the  development of heterotopic ossification.   We advised in our judgment all reasonable efforts to avoid these  complications will be taken and we anticipate use of Indocin in the early  postoperative period in order to suppress heterotopic ossification.   DESCRIPTION OF PROCEDURE:  The patient was brought to the operating room and  placed in the supine position on  the operating table.   Following induction of general anesthesia by LMA, the left arm was prepped  with Betadine soap and solution and sterilely draped.  A pneumatic  tourniquet was applied to the proximal brachium.   Following exsanguination of the limb with an Esmarch bandage, the tourniquet  was inflated to 220 mmHg.   The procedure commenced with a 3 cm transverse incision across the  antecubital fossa.  Subcutaneous tissues were carefully divided revealing  the lateral antebrachial cutaneous nerve and several cutaneous branches of  the antecubital fossa.  These were gently retracted.  The fascia was  released, and the hematoma at the site of the acute biceps rupture was  noted.  The hematoma was entered and with digital dissection I could  identify the proximal stump of the tendon in the brachium.   A Turrell tendon passing forceps was used with the headlight and ENT  retractors to retrieve the biceps.   This was  then secured with a #5 Kevlar fiber wire suture utilizing locking  stitch technique and a 4 x 12 mm Endo button was used with standard  technique anchoring the biceps.   Attention was then directed to the antecubital fossa.  With digital  dissection, the hematoma was cleared and with careful retraction we were  able to identify the radial tuberosity.   With the aid of a drill guide, a guidepin was placed followed by the use of  a 4.5 mm drill to drill across the radius with the forearm in full  supination.  A 9 mm drill bit was then used to create a receptacle for the  tendon in the volar cortex with the forearm in full supination.  Care was  taken not to breech the dorsal cortex.   The wound was then thoroughly lavaged with sterile saline and meticously  cleaned with a Frazier neural tip sucker in an effort to remove all bone  fragments.   We repeatedly lavaged the soft tissues in an effort to remove all bone  debris so as to diminish the development of heterotopic ossification.   A blunt trocar was used through the drill hole in the radius and then used  to dissect through the dorsal musculature with the forearm in full  supination to the dorsal skin on the forearm.  Care was taken to avoid the  ulna.  A small skin incision was used to allow passage of the blunt cannula  through the skin followed by placement of a wire snare to allow passage of  the Endo button guided sutures.   The #2 Kevlar sutures are placed at the peripheral holes of the Endo button,  passed through the drill hole in the radius and out the dorsal forearm skin  and subsequently the Endo button was passed into the defect in the radius.   With fluoroscopic control, the Endo button was pulled longitudinal through  the dorsal cortex of the radius and flipped with the second suture creating  an excellent fixation anchor.  The wound was inspected from the antecubital fossa and the distal portion of  the biceps  tendon was inset into the cortical defect in a very satisfactory  manner.   The fluoroscope was used to confirm satisfactory anchoring of the Endo  button on the dorsal cortex of the forearm and pronation and supination  confirm that a proper attachment of the biceps had been achieved with  excellent role of the biceps dorsally with pronation of the forearm.  Once again, the wounds were thoroughly lavaged with sterile saline followed  by removal of the guiding sutures.  The dorsal forearm wound was repaired  with intradermal 3-0 Prolene and a Steri-Strip and the antecubital wound  repaired with subdermal sutures of 4-0 Vicryl and intradermal 3-0 Prolene  and Steri-Strips.  Then 0.25% Marcaine was infiltrated for postoperative  analgesia.   There were no apparent complications.   DISPOSITION:  For after care, the patient was given prescriptions for  Dilaudid 2 mg one or two tablets p.o. q.4-6h. p.r.n. pain, Motrin 600 mg one  p.o. q.6h. p.r.n. pain, 30 tablets without refill and Keflex 500 mg one p.o.  q.8h. since four days of prophylactic antibiotic.   The patient will return to the office for followup in three days for  dressing change and placement in a thermoplastic splint to allow early  active range of motion exercise.                                               Katy Fitch Naaman Plummer., M.D.    RVS/MEDQ  D:  12/01/2002  T:  12/01/2002  Job:  [id number]   cc:   Anesthesia Department

## 2011-02-23 NOTE — Op Note (Signed)
[place]  Patient:    Steve [name], Steve [name] Visit Number: [id number] MRN: [id number]          Service Type: EMS Location: [place] Attending Physician:  Cathren Laine Dictated by:   Nicki Reaper, M.D. Proc. Date: 11/23/01 Admit Date:  11/23/2001                             Operative Report  PREOPERATIVE DIAGNOSIS:  Dislocation of proximal interphalangeals, index and middle of left hand.  POSTOPERATIVE DIAGNOSIS:  Dislocation of proximal interphalangeals, index and middle of left hand.  OPERATION:  Reduction of dislocation of proximal interphalangeals, index and middle, left.  SURGEON:  Nicki Reaper, M.D.  ANESTHESIA:  Metacarpal block.  HISTORY:  The patient is a 60 year old, right hand dominant male, who suffered a fall on the ice this morning and suffered an injury to his index and middle fingers.  There is a small laceration on the palmar aspect of each finger. Neurovascularly, the fingers are intact.  DESCRIPTION OF PROCEDURE:  The patient was given a metacarpal block with 2% Xylocaine without epinephrine, 8 cc were used.  He was prepped using Betadine solution.  The lacerations were opened and found to go into subcutaneous tissue.  The dislocations were then manipulated and reduced.  The wounds were left open. After being cleansed with the Betadine, sterile compressive dressings and splint were applied.  Each finger was stable in flexion and extension and to stressing radially and ulnarly.  The patient tolerated the procedure well.  He is discharged home to return to the [facility] [place] in one week on Vicodin and Keflex.  He is given a gram of Ancef prior to discharge. Dictated by:   Nicki Reaper, M.D. Attending Physician:  Cathren Laine DD:  11/23/01 TD:  11/23/01 Job: [id number]

## 2011-02-23 NOTE — Procedures (Signed)
NAME:  Steve [name], Steve [name] NO.:  [id number]   MEDICAL RECORD NO.:  [id number]          PATIENT TYPE:  OUT   LOCATION:  SLEEP CENTER                 FACILITY:  [facility]   PHYSICIAN:  Marcelyn Bruins, M.D. [facility] DATE OF BIRTH:  10-Jan-1951   DATE OF STUDY:  04/04/2005                              NOCTURNAL POLYSOMNOGRAM   REFERRING PHYSICIAN:  Dr. Lona Kettle   DATE OF STUDY:  April 04, 2005   INDICATION FOR THE STUDY:  Hypersomnia with sleep apnea. Epworth score was  12.   SLEEP ARCHITECTURE:  The patient had a total sleep time of 273 minutes with  adequate slow wave sleep and very diminished REM. Sleep onset latency was  prolonged at 53 minutes as was REM onset at 274 minutes. Sleep efficiency  was only 64%.   IMPRESSION:  1.  Moderate obstructive sleep apnea/hypopnea syndrome with a respiratory      disturbance index of 33 events per hour and oxygen desaturation as low      as 87%. The events were not positional but they were associated with      moderate snoring. Treatment for this degree of sleep apnea could include      weight loss, upper airway surgery, and continuous positive airway      pressure.  2.  Rare premature atrial contraction with no clinically significant      arrhythmias.  3.  Moderate numbers of leg jerks with mild sleep disruption. Clinical      correlation is suggested after appropriate treatment of sleep apnea.     ______________________________  Steve [name]    KC/MEDQ  D:  04/13/2005 16:04:09  T:  04/13/2005 22:30:24  Job:  [id number]

## 2011-02-23 NOTE — Assessment & Plan Note (Signed)
[facility]                               PULMONARY OFFICE NOTE   NAME:CUNNINGHAMManolo, Steve                   MRN:          [id number]  DATE:06/25/2006                            DOB:          07/22/1951    Pulmonary sleep followup.   PROBLEM:  1. Obstructive sleep apnea.  2. Perennial rhinitis.  3. Daytime somnolence.   HISTORY:  He has been trying to make auto-titration CPAP work, but has not  yet turned in his Smart Card, and says the masks are not so good.  With at  least one of the mask styles, he finds that he cannot breathe out well  through his nose because of what he considers blockage in his nose.  We  discussed going back to Dr. Annalee Steve for reevaluation in the hopes that at  least he could get enough improvement to be more comfortable with CPAP.  I  discussed CPAP desensitization, and we realized that this gentleman, who  never wanted CPAP in the first place, will need to give himself time to  adjust.  Ambien has not helped much.  Part of the problem is that he wants  freedom to roll around and change position in his sleep without disrupting  the mask.   MEDICATIONS:  1. Red yeast rice.  2. Ambien used p.r.n.   ALLERGIES:  No medication allergy.   OBJECTIVE:  VITAL SIGNS: Weight 196 pounds, BP 116/78, pulse regular 76,  room air saturation 97%.  HEENT:  Right eye droops, speech is clear, has Steri-Strips on a place on  his right temple.  Nasal airway is not obviously obstructed anteriorly, and  he seems to have no trouble breathing through his nose now.   IMPRESSION:  1. Obstructive sleep apnea with CPAP intolerance.  Question of some      mechanical obstruction to nasal airflow.  2. Seasonal allergic rhinitis.   PLAN:  1. He is going to turn in the Smart Card from his titration machine so      that we can try a fixed pressure.  He wants a smaller machine he can      travel with, and that will be easier also with a fixed  pressure      machine.  We did discuss alternative therapies, including surgeries and      oral appliances, and the option to discuss status with Dr. Annalee Steve.  2. Schedule return in 2 months, earlier p.r.n.                                   Steve D. Maple Hudson, MD, FCCP, FACP   CDY/MedQ  DD:  06/25/2006  DT:  06/27/2006  Job #:  [id number]   cc:   Steve Steve. Steve Ren, MD,FACP,FCCP  Steve Steve, M.D.

## 2011-02-23 NOTE — Op Note (Signed)
NAME:  Steve [name], Steve [name] NO.:  [id number]   MEDICAL RECORD NO.:  [id number]          PATIENT TYPE:  AMB   LOCATION:  SDS                          FACILITY:  [facility]   PHYSICIAN:  Kinnie Scales. Annalee Genta, M.D.DATE OF BIRTH:  [birth year]   DATE OF PROCEDURE:  02/12/2007  DATE OF DISCHARGE:                               OPERATIVE REPORT   INDICATIONS FOR SURGERY:  1. Obstructive sleep apnea.  2. Uvulopalatal hypertrophy.  3. 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  4. Inferior turbinate hypertrophy.   PREOPERATIVE DIAGNOSES:  1. Obstructive sleep apnea.  2. Uvulopalatal hypertrophy.  3. 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  4. Inferior turbinate hypertrophy.   POSTOPERATIVE DIAGNOSES:  1. Obstructive sleep apnea.  2. Uvulopalatal hypertrophy.  3. 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  4. Inferior turbinate hypertrophy.   OPERATION/PROCEDURE:  1. Uvulopalatopharyngoplasty.  2. Tonsillectomy.  3. Nasal septoplasty.  4. Bilateral inferior turbinate reduction.   SURGEON:  Kinnie Scales. Annalee Genta, M.D.   ANESTHESIA:  General endotracheal.   COMPLICATIONS:  None.   ESTIMATED BLOOD LOSS:  Less than 50 mL.   DISPOSITION:  The patient was transferred from the operating room to  recovery room in stable condition.   BRIEF HISTORY:  Steve [name] is a 60 year old white male who was  referred by his medical physician, Dr. Alwyn Ren, for surgical evaluation  of obstructive sleep apnea.  The patient has had a chronic history of  progressive nasal airway obstruction.  He had a history of prior nasal  injury.  Examination revealed a severely deviated septum with nasal  airway obstruction and turbinate hypertrophy.  The patient undergone  prior sleep study in November 2007 which showed an RDI of 33 events per  hour and an O2 desaturation 87%, findings consistent with moderately  severe obstructive sleep apnea.  The patient attempted  to wear CPAP  because of his severe nasal airway obstruction.  Was unable to  adequately tolerate CPAP as a treatment for his apnea.  The patient was  symptomatic with increasing nasal congestion, nasal obstruction and  daytime fatigue and hypersomnolence.  Given the patient's history,  examination, physical findings, I recommended to undertake surgical  intervention for the management of obstructive sleep apnea.  The risks,  benefits and possible complications of the surgical procedure were  discussed in detail.  The patient understood and concurred with our plan  for surgery which is scheduled for general anesthesia at [facility] Main OR.   DESCRIPTION OF PROCEDURE:  The patient was brought to the operating room  at [facility] Feb 12, 2007, placed in the supine position on  the operating table.  General endotracheal anesthesia was established  without difficulty.  The patient adequately anesthetized.  His nasal  cavity was examined and injected with 1% lidocaine and 1:100,000  epinephrine solution.  A total of 10 mL was injected in the  submucosal  fashion along the nasal septum and inferior turbinates, and the  patient's nasal cavity was then packed with Afrin-soaked cottonoid  pledgets.  The patient was prepped and draped in a  sterile fashion,  positioned on the operating table.  The surgical procedure was then  begun.   Nasal septoplasty was performed.  Left anterior hemitransfixion incision  was created.  A mucoperichondrial flap was elevated from anterior to  posterior on the patient's left-hand side.  Bony cartilaginous junction  was crossed in the midline and mucoperiosteal flap was elevated on the  patient's right.  Deviated bone and cartilage in the mid and posterior  aspects of the nasal septum were then implemented.  Mid septal cartilage  was removed, morselized, and returned to the mucoperichondrial pocket at  the conclusion of the surgical procedure.   Posterior bony septal  deviation and septal spurring were resected with a 4 mm osteotome and  through cutting forceps to create a midline nasal septum.  Anterior,  dorsal and columellar cartilage was not deviated.  This was left intact.  The mid septal cartilage was morselized and returned to the  mucoperichondrial pocket and the mucoperichondrial flaps reapproximated  with a 4-0 gut suture on a Keith needle in a horizontal mattress  fashion.  Hemitransfixion incision was closed with the same stitch and  bilateral Doyle nasal septal splints were placed after the application  of Bactroban ointment and were sutured in position with a 3-0 Ethilon  suture.   Bilateral inferior turbinate reduction was then performed with the  cautery set at 12 watts.  Two submucosal passes were made in each  inferior turbinate.  When the turbinates been adequately cauterized,  they were outfractured to create a more patent nasal cavity.  Small  stab incision was created in the anterior aspect of each turbinate.  A  small amount of turbinate bone was resected, preserving the overlying  mucosa.   Oral cavity then inspected.  There were no loose or broken teeth.  Hard  and soft palate were intact.  Crowe-Davis mouth gag was inserted out  difficulty.  Tonsillectomy was then performed using Bovie electrocautery  and dissecting in subcapsular fashion, the entire left tonsil was  resected from superior pole to tongue base.  Right tonsil was removed in  similar fashion.  Tonsillar fossa gently abraded with a dry tonsillar  sponge.  Several areas of point hemorrhage were cauterized with suction  cautery.  There was no active bleeding.   A uvulopalatopharyngoplasty was then performed.  The posterior extent of  the palate was then extubated and an anterior palatal incision was  created using Bovie electrocautery in a curvilinear fashion, resecting approximately 1 cm of palatal tissue and the entire uvula.   Dissection  was carried out from anterior to posterior and the posterior palatal  mucosa was preserved.  This was advanced anteriorly for closure.  Posterior tonsillar pillars were advanced and sutured to the anterior  tonsillar pillars using a 3-0 Vicryl suture in horizontal mattress  interrupted fashion.  This was performed bilaterally and the margin of  the palate was then closed with interrupted Vicryl sutures.  The  patient's nasal cavity and nasopharynx were then irrigated and  suctioned.  Orogastric tube was passed and the stomach  contents were aspirated.  Crowe-Davis mouth gag was released and  removed.  There were no loose or broken teeth and no bleeding.  The  patient was then awakened from the anesthetic, extubated, transferred  from the operating room to recovery in stable condition.  No  complications.  Blood loss less than 50 mL.           ______________________________  Kinnie Scales. Annalee Genta,  M.D.     DLS/MEDQ  D:  04/54/0981  T:  02/12/2007  Job:  [id number]

## 2011-02-23 NOTE — Procedures (Signed)
NAME:  Steve [name], Steve [name] NO.:  [id number]   MEDICAL RECORD NO.:  [id number]          PATIENT TYPE:  OUT   LOCATION:  SLEEP CENTER                 FACILITY:  [facility]   PHYSICIAN:  Clinton D. Maple Hudson, MD, FCCP, FACPDATE OF BIRTH:  1951-09-[birth year]   DATE OF STUDY:                            NOCTURNAL POLYSOMNOGRAM   REFERRING PHYSICIAN:  Onalee Hua L. Annalee Genta, M.D.   INDICATION FOR STUDY:  Hypersomnia with sleep apnea.   EPWORTH SLEEPINESS SCORE:  4/24 BMI 23.7, weight 185 pounds.   MEDICATIONS:  No home medications listed.  A baseline diagnostic NPSG on  April 04, 2005, had recorded an AHI of 33 per hour.   SLEEP ARCHITECTURE:  Total sleep time 318 minutes with a deficiency of  81%.  Stage 1 was 11%, stage 2 57%, stage 3 17%, stage 4 14%.  Sleep  latency 12 minutes, gram latency 16 minutes, awake after sleep onset 63  minutes, arousal index 13.  No bedtime medication was taken.   RESPIRATORY DATA:  Apnea/hypopnea index (AHI, RDI) of 4.3.  Obstructive  events per hour were within normal (normal range 0-5 per hour).  This  included 12 obstructive apneas and 11 hypopneas.  All events were  recorded while sleeping supine.  REM AHI 14.3.   OXYGEN DATA:  Mild snoring with oxygen desaturation to a nadir of 87%.  Mean oxygen saturation to the study was 95% on room air.   CARDIAC DATA:  Mild sinus arrhythmia, occasional PACs.   MOVEMENT-PARASOMNIA:  Very frequent limb jerks totaling 243 of which  only 13 were associated with arousal or awakened for periodic limb  movement with arousable index of 2.5 per hour.   IMPRESSIONS-RECOMMENDATIONS:  1. Occasional sleep disorder breathing events, within normal limits      (AHI 4.3 per hour).  Events were strongly associated with supine      sleep position and REM sleep.  It may help to spend most sleep time      on sides.  2. Frequent limb jerks which would qualify for restless leg syndrome      with the incidence of active arousals due to  leg movements which      was very low.      Clinton D. Maple Hudson, MD, [facility], FACP  Diplomate, Biomedical engineer of Sleep Medicine  Electronically Signed     CDY/MEDQ  D:  07/01/2007 12:42:52  T:  07/01/2007 16:58:18  Job:  [id number]

## 2011-02-23 NOTE — Op Note (Signed)
NAME:  Steve [name], Steve [name] NO.:  [id number]   MEDICAL RECORD NO.:  [id number]          PATIENT TYPE:  AMB   LOCATION:  DSC                          FACILITY:  [facility]   PHYSICIAN:  Katy Fitch. Sypher Montez Hageman., M.D.DATE OF BIRTH:  09-11-1951   DATE OF PROCEDURE:  08/03/2004  DATE OF DISCHARGE:                                 OPERATIVE REPORT   PREOPERATIVE DIAGNOSIS:  1.  Chronic AC arthropathy, left shoulder.  2.  Rule out rotator cuff tear.  3.  Rule out internal derangement of labrum.   POSTOPERATIVE DIAGNOSIS:  1.  90% deep surface tear of supraspinatus and infraspinatus tendons, left      shoulder.  2.  AC arthropathy with chronic partial AC separation.  3.  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  4.  Partial tear of long head of biceps at entrance point of bicipital      groove.   PROCEDURE:  1.  Diagnostic arthroscopy left shoulder.  2.  Arthroscopic debridement of degenerative labral tear and deep surface      rotator cuff degenerative tear.  3.  Open resection of distal clavicle.  4.  Open repair of supraspinatus and infraspinatus degenerative rotator cuff      tear.   SURGEON:  Katy Fitch. Sypher, M.D.   ASSISTANT:  Jonni Sanger, P.A.   ANESTHESIA:  General endotracheal supplemented by interscalene block.  Supervising anesthesiologist is Janetta Hora. Gelene Mink, M.D.   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  He has been treating his  shoulder with therapy, primarily rotator cuff strengthening exercises and  flexibility exercises for nearly two years.   Recently, an MRI of his shoulder demonstrated a thin rotator cuff with a  probable full-thickness tear of the supraspinatus as well as a degenerative  AC joint and a prominent anterolateral acromion.   Due to a failure to respond to nonoperative measures, he is brought to the  operating room at this time anticipating reconstruction of his left rotator  cuff and appropriate decompression.   Preoperatively, he was advised that we would keep him for 24-hour  observation should we proceed with rotator cuff repair and we also advised  him that we would repair any pathology identified at the time of arthroscopy  that were not apparent on the MRI of July of 2005.   He was noted to have a small ganglion at the musculotendinous junction of  his infraspinatus.  It was on the bursal side of the tendon.  We will  attempt to locate this and remove it arthroscopically.   DESCRIPTION OF PROCEDURE:  The patient was brought to the operating room and  placed in the supine position on the operating table.  Following  interscalene block in the holding area, anesthesia was satisfactory in the  left arm.   General orotracheal anesthesia was induced followed by careful positioning  in the beach chair position with the aid of torso and head holder designed  for shoulder arthroscopy.  The procedure commenced with distention of the left shoulder with 20 mL of  sterile saline utilizing 20 gauge spinal needle through the posterior  portal.   The scope was placed atraumatically with blunt technique.   Diagnostic arthroscopy revealed a degenerative labrum with a Buford complex  anteriorly that was moderately degenerative.   The biceps origin was noted to be stable when tested with the nerve hook.  The deep surface of the rotator cuff was carefully inspected and found to  have a considerable amount of degenerative tearing from the rotator interval  posteriorly across the entire supraspinatus and infraspinatus.  Photographic  documentation of pathology was accomplished with a digital camera followed  by use of the anterior portal suction shaver to debride the degenerative  Buford complex and Labrum thoroughly.  The deep surface of the rotator cuff  was thoroughly debrided of all  necrotic tissues.   The superior and inferior recesses were examined and found to be normal.  The subscapularis tendon was noted to be normal.   The scope was then removed from the glenohumeral joint and placed in the  subacromial space.  A thorough bursectomy allowed review of the pertinent  anatomy of the coracoacromial arch and AC joint.   The anterior medial acromion at the [facility] joint was quite prominent and causing  impingement on the cuff.   An electrocautery unit was used to release the coracoacromial ligament  followed by release of the capsule of the [facility] joint.   The scope was removed from the subacromial space and we proceeded directly  to open repair of the rotator cuff.  A 4 cm incision was fashioned at the  distal clavicle across the anterior acromion.  The anterior third of the  deltoid was elevated subperiosteally off of the acromion exposing a rather  floored bursitis.  After bursectomy, the anatomy of the cuff was inspected.  A longitudinal incision between the infraspinatus and supraspinatus tendons  revealed the area of degenerative tendon that was 90% thickness of the  supraspinatus and about 80% thickness of the infraspinatus.   The necrotic tendon tissues were debrided with a fine rongeur.  There was  noted to be about a 25% degenerative tear of the biceps as it turned the  corner over the edge of the lateral humerus/greater tuberosity.  This was  sharply debrided with a scalpel.  At least 75% of the tendon remained  intact.  After proper decompression, there should be a small risk for  rupture.   The ganglion in the infraspinatus was carefully searched for both  arthroscopically and with open technique.  It appeared that during  bursectomy, we probably removed this.   There was no mass effect noted on palpation.   The greater tuberosity was decorticated with a power bur, lowering its profile approximately 3 mm, down to bleeding cancellous bone.  Three 5 mm   bio corkscrew anchors were placed with #2 Fiberwire suture.   The rotator cuff split was repaired with a running baseball stitch with  grasping technique with Merlinda Frederick through-bone sutures, followed by the use  of a total of six mattress sutures to inset the supraspinatus and  infraspinatus along the decorticated greater tuberosity.   The margin of the repair was smooth with a mattress suture of 0 Vicryl.  The  wound was then thoroughly lavaged with sterile saline followed by repair of  the anterior third of the deltoid trapezius closing dead space created by  distal clavicle resection and the  anterior deltoid was repaired with through-  bone sutures to the acromion.   The wound was repaired with subdermal sutures of 2-0 Vicryl and intradermal  3-0 Prolene.  Compressive dressing applied with Xeroflow, sterile gauze, and  Hypafix.   For aftercare, the patient will [facility] for  observation of his vital signs and antibiotics in the form of Ancef 1 gram  IV q.8h x3 doses.  He will be discharged on the morning of  August 04, 2004, with prescriptions for Dilaudid 2 mg one or two tablets p.o. q.4-6h  p.r.n. pain, 30 tablets without refill.  Also Motrin 600 mg one p.o. q.6h  p.r.n. pain and Keflex 500 mg one p.o. q.8h x4 days for prophylactic  antibiotic.      [place]   RVS/MEDQ  D:  08/03/2004  T:  08/03/2004  Job:  [id number]

## 2011-02-23 NOTE — Assessment & Plan Note (Signed)
[facility]                             PULMONARY OFFICE NOTE   NAME:Steve [name], Steve [name]                   MRN:          [id number]  DATE:09/02/2006                            DOB:          04-30-1951    PROBLEM LIST:  1. Obstructive sleep apnea.  2. Perennial rhinitis.  3. Daytime somnolence.   HISTORY:  We have switched to CPAP and he is now using fixed pressure at  10 CWP but says he still cannot keep it on. He feels he is rested  without CPAP. Ambien has not improved his sleep quality or his tolerance  with the mask. We spent time again discussing the physiology, medical  issues, and available treatments. I have suggested that he revisit Dr.  Annalee Genta.   MEDICATIONS:  Red yeast rice. CPAP currently at 10 CWP throughout  [facility].   His NPSG at the [facility] on April 04, 2005 had shown moderate  obstructive apnea with an index of 33 per hour, oxygen desaturation to  87% and nonpositional events. Snoring was moderate. He also had some leg  jerks with mild sleep disturbance but it did not appear to be enough to  be critical and does not seem to be part of the complaint pattern  currently.   OBJECTIVE:  Weight 195 pounds, BP 126/90, pulse regular 73, room air  saturation 98%. Chronic right facial tic, audible inspiration flutter.  He is trim and alert, long palate, residual tonsils, no stridor, no  thyromegaly.   IMPRESSION:  Obstructive sleep apnea, unsuccessful tolerating CPAP.   PLAN:  I have suggested he revisit Dr. Annalee Genta to look at surgical  options at this point versus no therapy.  We have reviewed the  availability of oral appliances. I do not know if Dr. Annalee Genta is  working with Botox injections and whether that might add a treatment  dimension to his chronic facial tick as well. CPAP is discontinued. He  will return here p.r.n.     Clinton D. Maple Hudson, MD, Tonny Bollman, FACP  Electronically Signed    CDY/MedQ  DD:  09/07/2006  DT: 09/09/2006  Job #: [id number]   cc:   [name]. Alwyn Ren, MD,FACP,FCCP  [name], M.D.

## 2011-06-20 ENCOUNTER — Ambulatory Visit (INDEPENDENT_AMBULATORY_CARE_PROVIDER_SITE_OTHER): Payer: BC Managed Care – PPO | Admitting: Internal Medicine

## 2011-06-20 DIAGNOSIS — E785 Hyperlipidemia, unspecified: Secondary | ICD-10-CM

## 2011-06-20 DIAGNOSIS — L309 Dermatitis, unspecified: Secondary | ICD-10-CM

## 2011-06-20 DIAGNOSIS — L259 Unspecified contact dermatitis, unspecified cause: Secondary | ICD-10-CM

## 2011-06-20 DIAGNOSIS — R599 Enlarged lymph nodes, unspecified: Secondary | ICD-10-CM

## 2011-06-20 DIAGNOSIS — R590 Localized enlarged lymph nodes: Secondary | ICD-10-CM

## 2011-06-20 MED ORDER — SIMVASTATIN 40 MG PO TABS: 40.0000 mg | ORAL_TABLET | Freq: Every day | ORAL | Status: DC

## 2011-06-20 MED ORDER — CEPHALEXIN 500 MG PO CAPS: 500.0000 mg | ORAL_CAPSULE | Freq: Two times a day (BID) | ORAL | Status: AC

## 2011-06-20 NOTE — Progress Notes (Signed)
  Subjective:    Patient ID: Steve [name], male    DOB: 1950-10-29, 60 y.o.   MRN: [id number]  HPI  He noted swelling in the left jaw and behind the ear 06/17/2011. No swelling of the jaw has decreased in size a firm mass persists behind the jaw. He did not have pain with mastication during this period  Several weeks ago he did have some pain in his teeth but not recently. He denies frontal headaches, facial pain , or nasal purulence.     Review of Systems  He developed a shallow ulcerated lesion over the right anterior shoulder 2 weeks ago without trigger or injury. This is healing without treatment.     Objective:   Physical Exam   He appears healthy and well-nourished.  Skin is warm and dry; as a 15 x 12 mm erythematous plaque  @ the R anterior shoulder  There is no increased temperature or purulence.  Oral hygiene is excellent with good dental health. There is no evidence of temporomandibular joint dysfunction clinically  Nares are patent without erythema or exudates.  Otic canals are clear and tympanic membranes are normal.  There is no tenderness over the left masseter muscle area  He does have a firm lymph node at the angle of the left jaw posteriorly. He has no lymphadenopathy in the neck or axilla otherwise.  He has no organomegaly or masses; abdomen is nontender.        Assessment & Plan:  #1 cervical lymph node in the context of swelling of the left masseter muscle area which  has resolved. The history suggests possible recent Parotitis or Parotid duct occlusion  #2 localized dermatitis right shoulder which is healing.  Plan: See orders.

## 2011-06-20 NOTE — Patient Instructions (Signed)
Use warm compresses to the lymph node area 2-3 times a day as discussed.

## 2011-06-21 ENCOUNTER — Other Ambulatory Visit: Payer: BC Managed Care – PPO

## 2011-07-04 ENCOUNTER — Encounter: Payer: Self-pay | Admitting: Internal Medicine

## 2011-07-04 ENCOUNTER — Ambulatory Visit: Payer: BC Managed Care – PPO | Admitting: Internal Medicine

## 2011-07-04 ENCOUNTER — Ambulatory Visit (INDEPENDENT_AMBULATORY_CARE_PROVIDER_SITE_OTHER): Payer: BC Managed Care – PPO | Admitting: Internal Medicine

## 2011-07-04 ENCOUNTER — Other Ambulatory Visit: Payer: Self-pay | Admitting: Internal Medicine

## 2011-07-04 DIAGNOSIS — R599 Enlarged lymph nodes, unspecified: Secondary | ICD-10-CM

## 2011-07-04 DIAGNOSIS — R7309 Other abnormal glucose: Secondary | ICD-10-CM

## 2011-07-04 DIAGNOSIS — N529 Male erectile dysfunction, unspecified: Secondary | ICD-10-CM

## 2011-07-04 DIAGNOSIS — R590 Localized enlarged lymph nodes: Secondary | ICD-10-CM

## 2011-07-04 LAB — CBC WITH DIFFERENTIAL/PLATELET
Basophils Absolute: 0 10*3/uL (ref 0.0–0.1)
Basophils Relative: 0.5 % (ref 0.0–3.0)
Eosinophils Absolute: 0.1 10*3/uL (ref 0.0–0.7)
Eosinophils Relative: 1.3 % (ref 0.0–5.0)
HCT: 46 % (ref 39.0–52.0)
Hemoglobin: 15.6 g/dL (ref 13.0–17.0)
Lymphocytes Relative: 25 % (ref 12.0–46.0)
Lymphs Abs: 1.6 10*3/uL (ref 0.7–4.0)
MCHC: 34 g/dL (ref 30.0–36.0)
MCV: 95.7 fl (ref 78.0–100.0)
Monocytes Absolute: 0.6 10*3/uL (ref 0.1–1.0)
Monocytes Relative: 8.6 % (ref 3.0–12.0)
Neutro Abs: 4.2 10*3/uL (ref 1.4–7.7)
Neutrophils Relative %: 64.6 % (ref 43.0–77.0)
Platelets: 166 10*3/uL (ref 150.0–400.0)
RBC: 4.81 Mil/uL (ref 4.22–5.81)
RDW: 13.1 % (ref 11.5–14.6)
WBC: 6.5 10*3/uL (ref 4.5–10.5)

## 2011-07-04 LAB — HEMOGLOBIN A1C: Hgb A1c MFr Bld: 5.5 % (ref 4.6–6.5)

## 2011-07-04 NOTE — Progress Notes (Signed)
  Subjective:    Patient ID: Steve [name], male    DOB: June 03, 1951, 60 y.o.   MRN: [id number]  HPI   The tender mass at the angle of left jaw originally appeared 9/9; the office note of 9/12 was reviewed. He states that history as recorded is  correct. From 9/14-9/24 he was on antibiotics with no significant clinical change in the mass which remained tender to touch.  In the last 2 days it  has become less tender and has decreased in size. He denies fever, chills, sweats or weight loss.  Labs dated 04/06/2011 reviewed. Glucose was mildly elevated at 103; lipids were excellent with an LDL of 84 and HDL of 81.   Review of Systems   He denies symptoms of rhinosinusitis; specifically he's had no frontal headaches, facial pain, dental pain, earache or discharge, sore throat, or nasal purulence.     Objective:   Physical Exam  He appears healthy and in no acute distress.  Nares are patent with no evidence of erythema purulence  There is no evidence of conjunctivitis.  There is no pain to deep palpation of L mandibular teeth   He's had a uvulectomy; tonsils are absent with no osterior pharyngeal  pathology.  The left carotid is not tender; there is no evidence of TMJ.  There is a slightly tender 1 x 1 cm nodule at the angle of the left mandible posteriorly. No other lymphadenopathy is present in the neck or axilla.  Thyroid is normal to palpation. No organomegaly or masses        Assessment & Plan: #1 small slightly tender lymph node at the angle of the left mandible; no pathologic lymphadenopathy suggested clinically  #1    #2 mild hyperglycemia Plan: risks & options discussed

## 2011-07-04 NOTE — Patient Instructions (Signed)
If the small lymph node fails to resolve and certainly if it becomes more tender or increases in size, ENT consultation is appropriate. Clinically I see nothing that would warrant a CAT scan with its associated radiation exposure at this time.

## 2011-07-05 NOTE — Progress Notes (Signed)
Labs only

## 2011-07-06 ENCOUNTER — Other Ambulatory Visit (INDEPENDENT_AMBULATORY_CARE_PROVIDER_SITE_OTHER): Payer: BC Managed Care – PPO

## 2011-07-06 DIAGNOSIS — N529 Male erectile dysfunction, unspecified: Secondary | ICD-10-CM

## 2011-07-06 LAB — TESTOSTERONE: Testosterone: 349.78 ng/dL — ABNORMAL LOW (ref 350.00–890.00)

## 2011-07-09 ENCOUNTER — Encounter: Payer: Self-pay | Admitting: Internal Medicine

## 2011-08-02 ENCOUNTER — Other Ambulatory Visit: Payer: Self-pay | Admitting: Internal Medicine

## 2011-08-02 DIAGNOSIS — E291 Testicular hypofunction: Secondary | ICD-10-CM

## 2011-08-03 ENCOUNTER — Other Ambulatory Visit: Payer: BC Managed Care – PPO

## 2011-08-03 DIAGNOSIS — E291 Testicular hypofunction: Secondary | ICD-10-CM

## 2011-08-06 LAB — TESTOSTERONE, FREE, TOTAL, SHBG
Sex Hormone Binding: 41 nmol/L (ref 13–71)
Testosterone, Free: 97.8 pg/mL (ref 47.0–244.0)
Testosterone-% Free: 1.8 % (ref 1.6–2.9)
Testosterone: 529.05 ng/dL (ref 250–890)

## 2011-08-28 ENCOUNTER — Encounter: Payer: BC Managed Care – PPO | Admitting: Internal Medicine

## 2011-10-09 DIAGNOSIS — K5790 Diverticulosis of intestine, part unspecified, without perforation or abscess without bleeding: Secondary | ICD-10-CM

## 2011-10-09 DIAGNOSIS — K2210 Ulcer of esophagus without bleeding: Secondary | ICD-10-CM

## 2011-10-09 DIAGNOSIS — K222 Esophageal obstruction: Secondary | ICD-10-CM

## 2011-10-09 DIAGNOSIS — K648 Other hemorrhoids: Secondary | ICD-10-CM

## 2011-10-09 HISTORY — DX: Ulcer of esophagus without bleeding: K22.10

## 2011-10-09 HISTORY — PX: COLONOSCOPY W/ POLYPECTOMY: SHX1380

## 2011-10-09 HISTORY — DX: Esophageal obstruction: K22.2

## 2011-10-09 HISTORY — DX: Diverticulosis of intestine, part unspecified, without perforation or abscess without bleeding: K57.90

## 2011-10-09 HISTORY — DX: Other hemorrhoids: K64.8

## 2011-10-12 ENCOUNTER — Telehealth: Payer: Self-pay

## 2011-10-12 NOTE — Telephone Encounter (Signed)
No deficiency present per definitive studies (Per Dr.Hopper)  Left message on voicemail informing patient of doctor's response, patient to call for appointment if he would like to further discuss

## 2011-10-12 NOTE — Telephone Encounter (Signed)
Patient called to see if labs were addressed from 07/2011. I reviewed labs (did not populate to MD desktop). Patient informed I will have Dr.Hopper address and call him with results.  Copy faxed to [phone]

## 2011-11-14 ENCOUNTER — Encounter: Payer: Self-pay | Admitting: Internal Medicine

## 2011-12-03 ENCOUNTER — Ambulatory Visit (INDEPENDENT_AMBULATORY_CARE_PROVIDER_SITE_OTHER): Payer: BC Managed Care – PPO | Admitting: Internal Medicine

## 2011-12-03 ENCOUNTER — Encounter: Payer: Self-pay | Admitting: Internal Medicine

## 2011-12-03 VITALS — BP 126/88 | HR 75 | Temp 98.0°F | Wt 198.0 lb

## 2011-12-03 DIAGNOSIS — J0100 Acute maxillary sinusitis, unspecified: Secondary | ICD-10-CM

## 2011-12-03 MED ORDER — AMOXICILLIN 500 MG PO CAPS: 500.0000 mg | ORAL_CAPSULE | Freq: Three times a day (TID) | ORAL | Status: AC

## 2011-12-03 MED ORDER — FLUTICASONE PROPIONATE 50 MCG/ACT NA SUSP: 1.0000 | Freq: Two times a day (BID) | NASAL | Status: DC | PRN

## 2011-12-03 NOTE — Patient Instructions (Signed)
Plain Mucinex for thick secretions ;force NON dairy fluids . Use a Neti pot daily as needed for sinus congestion.  Nasal cleansing in the shower as discussed. Make sure that all residual soap is removed to prevent irritation. Fluticasone  1 spray in each nostril twice a day as needed. Use the "crossover" technique as discussed   

## 2011-12-03 NOTE — Progress Notes (Signed)
  Subjective:    Patient ID: Steve [name], male    DOB: [birth year], 61 y.o.   MRN: [id number]  HPI Respiratory tract infection Onset/symptoms:1 week ago as head congestion Exposures (illness/environmental/extrinsic):co workers Progression of symptoms:to purulence Treatments/response:Mucinex; Zinc.Nyquil/? benefit Present symptoms: Fever/chills/sweats:intermittent low grade fever Frontal headache:no Facial pain:yes Nasal purulence:yellow - brown Sore throat:no Dental pain:slight Lymphadenopathy:no Wheezing/shortness of breath:no Cough/sputum/hemoptysis:due to PNDrainage Associated extrinsic/allergic symptoms:itchy eyes/ sneezing:no  Smoking history:1982           Review of Systems     Objective:   Physical Exam General appearance:good health ;well nourished; no acute distress or increased work of breathing is present.  No  lymphadenopathy about the head, neck, or axilla noted.   Eyes: No conjunctival inflammation or lid edema is present.   Ears:  External ear exam shows no significant lesions or deformities.  Otoscopic examination reveals clear canals, tympanic membranes are intact bilaterally without significant bulging, retraction, inflammation or discharge.  Nose:  External nasal examination shows no deformity or inflammation. Nasal mucosa are dry without lesions or exudates. No septal dislocation or deviation.No obstruction to airflow. Hyponasal speech  Oral exam: Dental hygiene is good; lips and gums are healthy appearing.There is no oropharyngeal erythema or exudate noted.S/P uvulectomy    Heart:  Normal rate and regular rhythm. S1 and S2 normal without gallop, murmur, click, rub S 4 Lungs:Chest clear to auscultation; no wheezes, rhonchi,rales ,or rubs present.No increased work of breathing.    Extremities:  No cyanosis, edema, or clubbing  noted    Skin: Warm & dry           Assessment & Plan:  #1 maxillary sinusitis, acute. No associated  bronchitic symptoms  Plan: See orders & recommendations

## 2012-01-16 ENCOUNTER — Encounter: Payer: Self-pay | Admitting: Internal Medicine

## 2012-01-16 ENCOUNTER — Other Ambulatory Visit: Payer: Self-pay | Admitting: Internal Medicine

## 2012-01-16 NOTE — Telephone Encounter (Signed)
Patient needs to schedule fasting labs appointment Lipid/Hep 272.4/995.20

## 2012-02-18 ENCOUNTER — Other Ambulatory Visit: Payer: Self-pay | Admitting: Internal Medicine

## 2012-02-18 NOTE — Telephone Encounter (Signed)
Refill done.  

## 2012-02-19 ENCOUNTER — Ambulatory Visit: Payer: BC Managed Care – PPO | Admitting: Internal Medicine

## 2012-03-18 ENCOUNTER — Telehealth: Payer: Self-pay | Admitting: Internal Medicine

## 2012-03-19 NOTE — Telephone Encounter (Signed)
Pt called to confirm his appt date and time with Dr. Marina Goodell.

## 2012-04-01 ENCOUNTER — Encounter: Payer: BC Managed Care – PPO | Admitting: Internal Medicine

## 2012-04-02 ENCOUNTER — Encounter: Payer: Self-pay | Admitting: Internal Medicine

## 2012-04-02 ENCOUNTER — Ambulatory Visit (INDEPENDENT_AMBULATORY_CARE_PROVIDER_SITE_OTHER): Payer: BC Managed Care – PPO | Admitting: Internal Medicine

## 2012-04-02 VITALS — BP 118/78 | HR 68 | Ht 75.0 in | Wt 197.8 lb

## 2012-04-02 DIAGNOSIS — Z86010 Personal history of colonic polyps: Secondary | ICD-10-CM

## 2012-04-02 DIAGNOSIS — R1310 Dysphagia, unspecified: Secondary | ICD-10-CM

## 2012-04-02 MED ORDER — MOVIPREP 100 G PO SOLR: 1.0000 | Freq: Once | ORAL | Status: DC

## 2012-04-02 NOTE — Progress Notes (Signed)
HISTORY OF PRESENT ILLNESS:  Steve [name] is a 61 y.o. male with the below listed medical history who is followed in this office for history of adenomatous colon polyps. He presents today regarding surveillance colonoscopy and new onset dysphagia. His index colonoscopy was performed in 2003. Followup colonoscopy April 2008. Most recent colonoscopy with marked diverticulosis with stenosis. Diminutive hyperplastic polyp removed. In terms of dysphagia, he reports one to 2 year history of intermittent solid food dysphagia, particularly with bread items. No significant problems with heartburn. Stable weight. GI review of systems is otherwise negative. His son has a history of eosinophilic esophagitis with stricturing that requires intermittent dilation therapy  REVIEW OF SYSTEMS:  All non-GI ROS negative except for sinus and allergy trouble, urinary leakage  Past Medical History  Diagnosis Date  . Sleep apnea   . FH: colonic polyps   . Hyperlipidemia   . Squamous cell skin cancer     Dr.King [place]  . Benign hypertrophy of prostate     Dr.Dahlstedt  . Colon polyp     Past Surgical History  Procedure Date  . Nasal sinus surgery   . Uvulectomy & tonsillectomy 2008  . Torn biceps tendon     left arm  . Rotator cuff repair   . Colonoscopy w/ polypectomy     x 2    Social History Steve [name]  reports that he has quit smoking. He does not have any smokeless tobacco history on file. He reports that he drinks alcohol. He reports that he does not use illicit drugs.  family history includes Breast cancer in his mother; Diabetes in his daughter; Heart attack in his maternal grandfather; Prostate cancer in his father; Stroke in his father and paternal uncle; and Uterine cancer in his mother.  There is no history of Colon cancer.  No Known Allergies     PHYSICAL EXAMINATION: Vital signs: BP 118/78  Pulse 68  Ht 6\' 3"  (1.905 m)  Wt 197 lb 12.8 oz (89.721 kg)  BMI 24.72  kg/m2 General: Well-developed, well-nourished, no acute distress HEENT: Sclerae are anicteric, conjunctiva pink. Oral mucosa intact Lungs: Clear Heart: Regular Abdomen: soft, nontender, nondistended, no obvious ascites, no peritoneal signs, normal bowel sounds. No organomegaly. Extremities: No edema Psychiatric: alert and oriented x3. Cooperative      ASSESSMENT:  #1. Intermittent solid food dysphagia. Rule out stricture or ring #2. History of adenomatous colon polyps. Due for followup surveillance    PLAN:  #1. Upper endoscopy with probable esophageal dilation.The nature of the procedure, as well as the risks, benefits, and alternatives were carefully and thoroughly reviewed with the patient. Ample time for discussion and questions allowed. The patient understood, was satisfied, and agreed to proceed.  #2. Surveillance colonoscopy.The nature of the procedure, as well as the risks, benefits, and alternatives were carefully and thoroughly reviewed with the patient. Ample time for discussion and questions allowed. The patient understood, was satisfied, and agreed to proceed. Movi prep prescribed. The patient instructed on its use

## 2012-04-02 NOTE — Patient Instructions (Addendum)
You have been scheduled for an endoscopy with dilation and colonoscopy with propofol. Please follow the written instructions given to you at your visit today. Please pick up your prep at the pharmacy within the next 1-3 days.

## 2012-05-21 ENCOUNTER — Encounter: Payer: Self-pay | Admitting: Internal Medicine

## 2012-05-21 ENCOUNTER — Ambulatory Visit (AMBULATORY_SURGERY_CENTER): Payer: BC Managed Care – PPO | Admitting: Internal Medicine

## 2012-05-21 VITALS — BP 126/74 | HR 72 | Temp 96.7°F | Resp 18 | Ht 75.0 in | Wt 197.0 lb

## 2012-05-21 DIAGNOSIS — K222 Esophageal obstruction: Secondary | ICD-10-CM

## 2012-05-21 DIAGNOSIS — D126 Benign neoplasm of colon, unspecified: Secondary | ICD-10-CM

## 2012-05-21 DIAGNOSIS — Z86010 Personal history of colonic polyps: Secondary | ICD-10-CM

## 2012-05-21 DIAGNOSIS — K2100 Gastro-esophageal reflux disease with esophagitis, without bleeding: Secondary | ICD-10-CM

## 2012-05-21 DIAGNOSIS — Z1211 Encounter for screening for malignant neoplasm of colon: Secondary | ICD-10-CM

## 2012-05-21 DIAGNOSIS — R1310 Dysphagia, unspecified: Secondary | ICD-10-CM

## 2012-05-21 MED ORDER — OMEPRAZOLE 40 MG PO CPDR: 40.0000 mg | DELAYED_RELEASE_CAPSULE | Freq: Every day | ORAL | Status: DC

## 2012-05-21 MED ORDER — SODIUM CHLORIDE 0.9 % IV SOLN: 500.0000 mL | INTRAVENOUS | Status: DC

## 2012-05-21 NOTE — Op Note (Signed)
 [facility] [address] [place], [place]  [address]  ENDOSCOPY PROCEDURE REPORT  PATIENT:  Steve, [name]  MR#:  [id number] BIRTHDATE:  [birth year], 61 yrs. old  GENDER:  male  ENDOSCOPIST:  Wilhemina Bonito. Eda Keys, MD Referred by:  Office  PROCEDURE DATE:  05/21/2012 PROCEDURE:  EGD with balloon dilatation - 18, 19, 20mm ASA CLASS:  Class II INDICATIONS:  dysphagia  MEDICATIONS:   MAC sedation, administered by CRNA, propofol (Diprivan) 180 mg IV TOPICAL ANESTHETIC:  none  DESCRIPTION OF PROCEDURE:   After the risks benefits and alternatives of the procedure were thoroughly explained, informed consent was obtained.  The LB GIF-H180 [id number] endoscope was introduced through the mouth and advanced to the second portion of the duodenum, without limitations.  The instrument was slowly withdrawn as the mucosa was fully examined. 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  The stomach was entered and closely examined. The antrum, angularis, and lesser curvature were well visualized, including a retroflexed view of the cardia and fundus. The stomach wall was normally distensable. The scope passed easily through the pylorus into the duodenum.  The duodenal bulb was normal in appearance, as was the postbulbar duodenum. Retroflexed views revealed a small hiatal hernia.  THERAPY: SEQUENTIAL TTS BALLOON DILATION UNDER DIRECT VISION - 18,19,53mm. 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 Resume prior diet tomorrow. 2) PRESCRIBE OMEPRAZOLE 40MG  DAILY; #30; 11 REFILLS 3) Call office next 2-3 days to schedule an office appointment for 6-8  WEEKS  ______________________________ Wilhemina Bonito. Eda Keys, MD  CC:  The Patient;   Pecola Lawless, MD  n. Rosalie DoctorWilhemina Bonito. Eda Keys at 05/21/2012 02:16 PM  Robina Ade, [phone]

## 2012-05-21 NOTE — Patient Instructions (Addendum)
YOU HAD AN ENDOSCOPIC PROCEDURE TODAY AT THE [facility]: Refer to the procedure report that was given to you for any specific questions about what was found during the examination.  If the procedure report does not answer your questions, please call your gastroenterologist to clarify.  If you requested that your care partner not be given the details of your procedure findings, then the procedure report has been included in a sealed envelope for you to review at your convenience later.  YOU SHOULD EXPECT: Some feelings of bloating in the abdomen. Passage of more gas than usual.  Walking can help get rid of the air that was put into your GI tract during the procedure and reduce the bloating. If you had a lower endoscopy (such as a colonoscopy or flexible sigmoidoscopy) you may notice spotting of blood in your stool or on the toilet paper. If you underwent a bowel prep for your procedure, then you may not have a normal bowel movement for a few days.  DIET:  FOLLOW DILATATION DIET GIVEN TO YOU TODAY!  ACTIVITY: Your care partner should take you home directly after the procedure.  You should plan to take it easy, moving slowly for the rest of the day.  You can resume normal activity the day after the procedure however you should NOT DRIVE or use heavy machinery for 24 hours (because of the sedation medicines used during the test).    SYMPTOMS TO REPORT IMMEDIATELY: A gastroenterologist can be reached at any hour.  During normal business hours, 8:30 AM to 5:00 PM Monday through Friday, call [phone].  After hours and on weekends, please call the GI answering service at [phone] who will take a message and have the physician on call contact you.   Following lower endoscopy (colonoscopy or flexible sigmoidoscopy):  Excessive amounts of blood in the stool  Significant tenderness or worsening of abdominal pains  Swelling of the abdomen that is new, acute  Fever of 100F or  higher  Following upper endoscopy (EGD)  Vomiting of blood or coffee ground material  New chest pain or pain under the shoulder blades  Painful or persistently difficult swallowing  New shortness of breath  Fever of 100F or higher  Black, tarry-looking stools  FOLLOW UP: If any biopsies were taken you will be contacted by phone or by letter within the next 1-3 weeks.  Call your gastroenterologist if you have not heard about the biopsies in 3 weeks.  Our staff will call the home number listed on your records the next business day following your procedure to check on you and address any questions or concerns that you may have at that time regarding the information given to you following your procedure. This is a courtesy call and so if there is no answer at the home number and we have not heard from you through the emergency physician on call, we will assume that you have returned to your regular daily activities without incident.  SIGNATURES/CONFIDENTIALITY: You and/or your care partner have signed paperwork which will be entered into your electronic medical record.  These signatures attest to the fact that that the information above on your After Visit Summary has been reviewed and is understood.  Full responsibility of the confidentiality of this discharge information lies with you and/or your care-partner.   Information on polyps,diverticulosis, hemorrhoids, high fiber diet,& esophagitis, & dilatation diet given to you today   OMEPRAZOLE RX SENT TO [facility] FOR YOU TO  PICK UP

## 2012-05-21 NOTE — Progress Notes (Signed)
Patient did not experience any of the following events: a burn prior to discharge; a fall within the facility; wrong site/side/patient/procedure/implant event; or a hospital transfer or hospital admission upon discharge from the facility. (G8907) Patient did not have preoperative order for IV antibiotic SSI prophylaxis. (G8918)  

## 2012-05-21 NOTE — Op Note (Signed)
[facility] [address] [place], [place]  [address]  COLONOSCOPY PROCEDURE REPORT  PATIENT:  Steve [name], Steve [name]  MR#:  [id number] BIRTHDATE:  28-Mar-1951, 61 yrs. old  GENDER:  male ENDOSCOPIST:  Wilhemina Bonito. Eda Keys, MD REF. BY:  Surveillance Program Recall, PROCEDURE DATE:  05/21/2012 PROCEDURE:  Colonoscopy with snare polypectomy x 1 ASA CLASS:  Class II INDICATIONS:  history of pre-cancerous (adenomatous) colon polyps, surveillance and high-risk screening ; index 2003 (TA); f/u 2008 (HPP) MEDICATIONS:   MAC sedation, administered by CRNA, propofol (Diprivan) 280 mg IV  DESCRIPTION OF PROCEDURE:   After the risks benefits and alternatives of the procedure were thoroughly explained, informed consent was obtained.  Digital rectal exam was performed and revealed no abnormalities.   The LB CF-[id number] endoscope was introduced through the anus and advanced to the cecum, which was identified by both the appendix and ileocecal valve, without limitations.  The quality of the prep was excellent, using MoviPrep.  The instrument was then slowly withdrawn as the colon was fully examined. <<PROCEDUREIMAGES>>  FINDINGS:  A diminutive polyp was found in the proximal transverse colon and snared without cautery. Retrieval was successful. Moderate diverticulosis with sigmoid stenosis was found in the left colon.  Otherwise normal colonoscopy without other polyps, masses, vascular ectasias, or inflammatory changes.   Retroflexed views in the rectum revealed internal hemorrhoids.    The time to cecum = 2:40  minutes. The scope was then withdrawn in  11:09  minutes from the cecum and the procedure completed.  COMPLICATIONS:  None  ENDOSCOPIC IMPRESSION: 1) Diminutive polyp in the proximal transverse colon - removed 2) Moderate diverticulosis in the left colon with sigmoid stenosis 3) Otherwise normal colonoscopy 4) Internal hemorrhoids  RECOMMENDATIONS: 1) Follow up  colonoscopy in 5 years  ______________________________ Wilhemina Bonito. Eda Keys, MD  CC:  The Patient;  Pecola Lawless, MD  n. Rosalie DoctorWilhemina Bonito. Eda Keys at 05/21/2012 01:55 PM  Robina Ade, [phone]

## 2012-05-22 ENCOUNTER — Telehealth: Payer: Self-pay | Admitting: *Deleted

## 2012-05-22 NOTE — Telephone Encounter (Signed)
  Follow up Call-  Call back number 05/21/2012  Post procedure Call Back phone  # [phone]  Permission to leave phone message Yes     Patient questions:  Do you have a fever, pain , or abdominal swelling? no Pain Score  0 *  Have you tolerated food without any problems? yes  Have you been able to return to your normal activities? yes  Do you have any questions about your discharge instructions: Diet   no Medications  no Follow up visit  no  Do you have questions or concerns about your Care? no  Actions: * If pain score is 4 or above: No action needed, pain <4.

## 2012-05-26 ENCOUNTER — Other Ambulatory Visit: Payer: Self-pay | Admitting: Internal Medicine

## 2012-05-27 ENCOUNTER — Encounter: Payer: Self-pay | Admitting: Internal Medicine

## 2012-05-27 NOTE — Telephone Encounter (Signed)
Refill done.  

## 2012-07-02 ENCOUNTER — Ambulatory Visit: Payer: BC Managed Care – PPO | Admitting: Internal Medicine

## 2012-07-16 ENCOUNTER — Encounter: Payer: Self-pay | Admitting: Internal Medicine

## 2012-07-16 ENCOUNTER — Ambulatory Visit (INDEPENDENT_AMBULATORY_CARE_PROVIDER_SITE_OTHER): Payer: BC Managed Care – PPO | Admitting: Internal Medicine

## 2012-07-16 VITALS — BP 120/64 | HR 68 | Ht 75.0 in | Wt 199.0 lb

## 2012-07-16 DIAGNOSIS — K222 Esophageal obstruction: Secondary | ICD-10-CM

## 2012-07-16 DIAGNOSIS — R141 Gas pain: Secondary | ICD-10-CM

## 2012-07-16 DIAGNOSIS — R143 Flatulence: Secondary | ICD-10-CM

## 2012-07-16 DIAGNOSIS — R142 Eructation: Secondary | ICD-10-CM

## 2012-07-16 DIAGNOSIS — Z860100 Personal history of colon polyps, unspecified: Secondary | ICD-10-CM

## 2012-07-16 DIAGNOSIS — R1310 Dysphagia, unspecified: Secondary | ICD-10-CM

## 2012-07-16 DIAGNOSIS — K219 Gastro-esophageal reflux disease without esophagitis: Secondary | ICD-10-CM

## 2012-07-16 MED ORDER — ALIGN PO CAPS: 1.0000 | ORAL_CAPSULE | Freq: Every day | ORAL | Status: DC

## 2012-07-16 NOTE — Patient Instructions (Addendum)
We have given you samples of Align. This puts good bacteria back into your colon. You should take 1 capsule by mouth once daily. If this works well for you, it can be purchased over the counter.  Please follow up with Dr. Perry as needed 

## 2012-07-16 NOTE — Progress Notes (Signed)
HISTORY OF PRESENT ILLNESS:  Steve [name] is a 61 y.o. male with the below listed medical history who presents today for followup. He was evaluated 04/02/2012 regarding a 2 year history of intermittent solid food dysphagia. See that dictation. She also has a history of adenomatous colon polyps. On 05/21/2012 he concurrently underwent colonoscopy and upper endoscopy. Colonoscopy, for the purposes of routine surveillance, demonstrated a diminutive proximal colon polyp which was removed and found to be a benign tubular adenoma. In addition moderate diverticulosis in the left colon with sigmoid stenosis and internal hemorrhoids. Routine followup in 5 years recommended. Upper endoscopy revealed a peptic stricture of the distal esophagus with associated erosive esophagitis. Examination was otherwise normal. The esophagus was dilated with a sequential balloon dilator to a maximal diameter of 20 mm. Post dilation, omeprazole 40 mg daily was prescribed. The patient presents today for routine followup as requested. Since his procedures, he has done well. No complaints of reflux. No recurrent dysphagia. No appreciable medication side effect. His only active GI complaint is that of increased intestinal gas as manifested by flatus. He has had a slight change in diet.  REVIEW OF SYSTEMS:  All non-GI ROS negative except for sinus and allergy trouble, night sweats  Past Medical History  Diagnosis Date  . Sleep apnea   . Hyperlipidemia   . Squamous cell skin cancer     Dr.King [place]  . Benign hypertrophy of prostate     Dr.Dahlstedt  . Hx of colonic polyp 2013  . Diverticulosis 2013    Moderate   . Internal hemorrhoids 2013  . Ulcerative esophagitis 2013  . Stricture esophagus 2013     Past Surgical History  Procedure Date  . Nasal sinus surgery   . Uvulectomy & tonsillectomy 2008  . Torn biceps tendon     left arm  . Rotator cuff repair   . Colonoscopy w/ polypectomy     x 2    Social  History Steve [name]  reports that he has quit smoking. He has never used smokeless tobacco. He reports that he drinks alcohol. He reports that he does not use illicit drugs.  family history includes Breast cancer in his mother; Diabetes in his daughter; Heart attack in his maternal grandfather; Prostate cancer in his father; Stroke in his father and paternal uncle; and Uterine cancer in his mother.  There is no history of Colon cancer, and Rectal cancer, and Stomach cancer, .  No Known Allergies     PHYSICAL EXAMINATION: Vital signs: BP 120/64  Pulse 68  Ht 6\' 3"  (1.905 m)  Wt 199 lb (90.266 kg)  BMI 24.87 kg/m2 General: Well-developed, well-nourished, no acute distress Abdomen: not reexamined. Psychiatric: alert and oriented x3. Cooperative    ASSESSMENT:  #1. 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 Currently asymptomatic post dilation on PPI. I reviewed with him today the pathophysiology of GERD. We discussed complications of the disease. We discussed treatment strategies and outcomes expectations #2. History of adenomatous colon polyps. Due for surveillance in 5 years #3. Diverticulosis  #4. Increased intestinal gas. Flatus   PLAN:  #1. Reflux precautions #2. Continue daily PPI #3. Routine followup in 1 year. Sooner if clinically indicated #4. Samples of probiotic Align provided to see if this helps with intestinal gas #5. Surveillance colonoscopy around August 2018 #6. Ongoing general medical care with Dr. Alwyn Ren

## 2012-07-23 ENCOUNTER — Other Ambulatory Visit: Payer: Self-pay | Admitting: Internal Medicine

## 2012-09-02 ENCOUNTER — Other Ambulatory Visit: Payer: Self-pay | Admitting: Internal Medicine

## 2012-09-02 NOTE — Telephone Encounter (Signed)
LIPID/HEP 272.4/995.20  

## 2012-10-10 ENCOUNTER — Other Ambulatory Visit: Payer: Self-pay | Admitting: Internal Medicine

## 2012-10-10 NOTE — Telephone Encounter (Signed)
Lipid/Hep 272.4/995.20  

## 2012-11-04 ENCOUNTER — Ambulatory Visit (INDEPENDENT_AMBULATORY_CARE_PROVIDER_SITE_OTHER): Payer: BC Managed Care – PPO | Admitting: Internal Medicine

## 2012-11-04 ENCOUNTER — Encounter: Payer: Self-pay | Admitting: Internal Medicine

## 2012-11-04 VITALS — BP 122/80 | HR 61 | Temp 98.1°F | Wt 201.0 lb

## 2012-11-04 DIAGNOSIS — R0989 Other specified symptoms and signs involving the circulatory and respiratory systems: Secondary | ICD-10-CM

## 2012-11-04 DIAGNOSIS — F458 Other somatoform disorders: Secondary | ICD-10-CM

## 2012-11-04 DIAGNOSIS — R498 Other voice and resonance disorders: Secondary | ICD-10-CM

## 2012-11-04 DIAGNOSIS — R198 Other specified symptoms and signs involving the digestive system and abdomen: Secondary | ICD-10-CM

## 2012-11-04 DIAGNOSIS — R499 Unspecified voice and resonance disorder: Secondary | ICD-10-CM

## 2012-11-04 DIAGNOSIS — J310 Chronic rhinitis: Secondary | ICD-10-CM

## 2012-11-04 DIAGNOSIS — Z23 Encounter for immunization: Secondary | ICD-10-CM

## 2012-11-04 MED ORDER — FLUTICASONE PROPIONATE 50 MCG/ACT NA SUSP: NASAL | Status: DC

## 2012-11-04 MED ORDER — SIMVASTATIN 40 MG PO TABS: ORAL_TABLET | ORAL | Status: DC

## 2012-11-04 MED ORDER — BECLOMETHASONE DIPROPIONATE 80 MCG/ACT IN AERS: INHALATION_SPRAY | RESPIRATORY_TRACT | Status: DC

## 2012-11-04 NOTE — Progress Notes (Signed)
  Subjective:    Patient ID: Steve [name], male    DOB: 01/02/1951, 62 y.o.   MRN: [id number]  HPI The respiratory tract symptoms began 11/03/12 as scratchy voice w/o sore throat , rhinitis, head congestion ,chest congestion  or cough .  Significant active  associated symptoms include "lump in throat".   Flu shot NOT current.   No treatment to date for hoarseness.   There is no history of asthma . PMH  seasonal  allergies. The patient had  quit smoking in 1982.                  Review of Systems Symptoms not present include frontal headache, facial pain, dental pain, sore throat, nasal purulence, earache , and otic discharge. No cough, shortness of breath , or wheezing present. Fever,chills & sweats not present .  Itchy , watery eyes & sneezing were not noted. Myalgias and arthralgias were not presentSUBJECTIVE:/P uvulectomy    Objective:   Physical Exam General appearance:good health ;well nourished; no acute distress or increased work of breathing is present.  No  lymphadenopathy about the head, neck, or axilla noted.  Eyes: No conjunctival inflammation or lid edema is present. OD ptosis  Ears:  External ear exam shows no significant lesions or deformities.  Otoscopic examination reveals clear canals, tympanic membranes are intact bilaterally without bulging, retraction, inflammation or discharge. Nose:  External nasal examination shows no deformity or inflammation. Nasal mucosa are pink and moist without lesions or exudates. No septal dislocation or deviation.No obstruction to airflow.  Oral exam: Dental hygiene is good; lips and gums are healthy appearing.There is no oropharyngeal erythema or exudate noted.S/p uvulectomy  Neck:  No deformities,  masses, or tenderness noted.    Heart:  Normal rate and regular rhythm. S1 and S2 normal without gallop, murmur, click, rub or other extra sounds.  Lungs:Chest clear to auscultation; no wheezes, rhonchi,rales ,or rubs  present.No increased work of breathing.   Extremities:  No cyanosis, edema, or clubbing  noted  Skin: Warm & dry .        Assessment & Plan:  #1 laryngitis & globus sensation Plan: See orders and recommendations

## 2012-11-04 NOTE — Patient Instructions (Addendum)
Plain Mucinex (NOT D) for thick secretions ;force NON dairy fluids .   Nasal cleansing in the shower as discussed with lather of mild shampoo.After 10 seconds wash off lather while  exhaling through nostrils. Make sure that all residual soap is removed to prevent irritation.  Fluticasone 1 spray in each nostril twice a day as needed. Use the "crossover" technique into opposite nostril spraying toward opposite ear @ 45 degree angle, not straight up into nostril.  Use a Neti pot daily only  as needed for significant sinus congestion; going from open side to congested side . Plain Allegra (NOT D )  160 daily , Loratidine 10 mg , OR Zyrtec 10 mg @ bedtime  as needed for itchy eyes & sneezing. Zicam Melts or Zinc lozenges ; vitamin C 2000 mg daily; & Echinacea for 4-7 days. Fill Rx for Amoxicillin ONLY if having  fever, exudate("pus") or progressive pain as discussed.    Please  schedule fasting Labs : BMET,Lipids, hepatic panel, TSH. PLEASE BRING THESE INSTRUCTIONS TO FOLLOW UP  LAB APPOINTMENT.This will guarantee correct labs are drawn, eliminating need for repeat blood sampling ( needle sticks ! ). Diagnoses /Codes: 295.6,213.08

## 2012-11-17 ENCOUNTER — Telehealth: Payer: Self-pay | Admitting: Internal Medicine

## 2012-11-17 NOTE — Telephone Encounter (Signed)
Patient Information:  Caller Name: Breeze  Phone: [phone]  Patient: Steve [name], Steve [name]  Gender: Male  DOB: 1951-06-26  Age: 62 Years  PCP: Marga Melnick  Office Follow Up:  Does the office need to follow up with this patient?: No  Instructions For The Office: N/A  RN Note:  Pt was seen on 1-28 for Laryngitis, Pt has had no improvement w/ Qvar.  All emergent symptoms ruled out per Sore Throat/Hoarseness protocol in [facility], see in 24 hrs due to Evaulated by Provider and no improvement after recommended treatment plan.  Dicussed herbal tea and honey.  No same day appts on 2-10.  Pt transferred to office for appt within 24 hrs.  Pt verbalized understanding.   Symptoms  Reason For Call & Symptoms: Laryngitis follow up  Reviewed Health History In EMR: Yes  Reviewed Medications In EMR: Yes  Reviewed Allergies In EMR: Yes  Reviewed Surgeries / Procedures: Yes  Date of Onset of Symptoms: 11/04/2012  Treatments Tried: Qvar  Treatments Tried Worked: No  Guideline(s) Used:  No Protocol Available - Sick Adult  Disposition Per Guideline:   See Today or Tomorrow in Office  Reason For Disposition Reached:   Nursing judgment  Advice Given:  N/A

## 2012-11-17 NOTE — Telephone Encounter (Signed)
Patient with pending appointment for 11/18/12, per Dr.Hopper's protocol if patient scheduled for appointment or sent to ER/UC ok to close encounter

## 2012-11-18 ENCOUNTER — Encounter: Payer: Self-pay | Admitting: Internal Medicine

## 2012-11-18 ENCOUNTER — Ambulatory Visit (INDEPENDENT_AMBULATORY_CARE_PROVIDER_SITE_OTHER): Payer: BC Managed Care – PPO | Admitting: Internal Medicine

## 2012-11-18 VITALS — BP 112/80 | HR 66 | Temp 98.3°F | Wt 200.4 lb

## 2012-11-18 DIAGNOSIS — R490 Dysphonia: Secondary | ICD-10-CM

## 2012-11-18 NOTE — Progress Notes (Signed)
  Subjective:    Patient ID: Steve [name], male    DOB: [birth year], 62 y.o.   MRN: [id number]  HPI  The hoarseness persists despite using the topical inhaled steroids. It is not associated with any respiratory tract symptoms or reflux symptoms.  He has had esophageal dilation for stricture. He is on omeprazole 40 mg daily.  He quit smoking in 1983. There is no personal or family history of thyroid disease    Review of Systems He denies frontal headache, facial pain, post nasal drainage,nasal purulence, sore throat, or dental pain.  He denies tremor or palpitations. He also has no constipation or other bowel change.  He has had no change in his hair, skin or nails.    Objective:   Physical Exam He appears healthy and well-nourished.   S/P uvulectomy; oral hygiene excellent  There is some ptosis on the right. No lid lag or nystagmus is present  He has no lymphadenopathy about the neck or axilla  Thyroid is normal to palpation.  Rhythm is regular without murmur.  Deep tendon reflexes are equal and normal.  Has no tremor  No onycholysis is present.        Assessment & Plan:  #1 persistent hoarseness unresponsive to inhaled steroids.  Plan: TSH will be checked. Referral will be made to ENT for direct laryngoscopy.

## 2012-11-18 NOTE — Patient Instructions (Addendum)
Review and correct the record as indicated. Please share record with all medical staff seen.  

## 2012-11-19 LAB — TSH: TSH: 1.41 u[IU]/mL (ref 0.35–5.50)

## 2013-02-21 ENCOUNTER — Other Ambulatory Visit: Payer: Self-pay | Admitting: Internal Medicine

## 2013-04-14 ENCOUNTER — Encounter: Payer: BC Managed Care – PPO | Admitting: Internal Medicine

## 2013-04-20 ENCOUNTER — Encounter: Payer: Self-pay | Admitting: Internal Medicine

## 2013-04-20 ENCOUNTER — Ambulatory Visit (INDEPENDENT_AMBULATORY_CARE_PROVIDER_SITE_OTHER): Payer: BC Managed Care – PPO | Admitting: Internal Medicine

## 2013-04-20 VITALS — BP 112/68 | HR 66 | Temp 97.7°F | Resp 12 | Ht 74.03 in | Wt 203.0 lb

## 2013-04-20 DIAGNOSIS — Z86010 Personal history of colonic polyps: Secondary | ICD-10-CM

## 2013-04-20 DIAGNOSIS — Z23 Encounter for immunization: Secondary | ICD-10-CM

## 2013-04-20 DIAGNOSIS — Z0000 Encounter for general adult medical examination without abnormal findings: Secondary | ICD-10-CM

## 2013-04-20 DIAGNOSIS — Z2911 Encounter for prophylactic immunotherapy for respiratory syncytial virus (RSV): Secondary | ICD-10-CM

## 2013-04-20 DIAGNOSIS — E782 Mixed hyperlipidemia: Secondary | ICD-10-CM

## 2013-04-20 DIAGNOSIS — Z8719 Personal history of other diseases of the digestive system: Secondary | ICD-10-CM | POA: Insufficient documentation

## 2013-04-20 MED ORDER — SIMVASTATIN 40 MG PO TABS: ORAL_TABLET | ORAL | Status: DC

## 2013-04-20 NOTE — Progress Notes (Signed)
  Subjective:    Patient ID: Steve [name], male    DOB: 1951/05/05, 62 y.o.   MRN: [id number]  HPI  Hank is here for a physical;acute issues denied.     Review of Systems He is on a heart healthy diet; he exercises 90 minutes 3-4 times per week without symptoms. Specifically he denies chest pain, palpitations, dyspnea, or claudication. Family history is negative for premature coronary disease. Advanced cholesterol testing reveals his LDL goal is less than 110.     Objective:   Physical Exam Gen.: Healthy and well-nourished in appearance. Alert, appropriate and cooperative throughout exam. Head: Normocephalic without obvious abnormalities;  no alopecia  Eyes: No corneal or conjunctival inflammation noted.  Extraocular motion intact. Vision grossly normal without lenses Ears: External  ear exam reveals no significant lesions or deformities. Canals clear .TMs normal. Hearing is grossly normal bilaterally. Nose: External nasal exam reveals no deformity or inflammation. Nasal mucosa are pink and moist. No lesions or exudates noted.   Mouth: Oral mucosa and oropharynx reveal no lesions or exudates. Teeth in good repair.Decreased L nasolabial fold Neck: No deformities, masses, or tenderness noted. Range of motion & Thyroid normal Lungs: Normal respiratory effort; chest expands symmetrically. Lungs are clear to auscultation without rales, wheezes, or increased work of breathing. Heart: Normal rate and rhythm. Normal S1 and S2. No gallop, click, or rub. S4 w/o murmur. Abdomen: Bowel sounds normal; abdomen soft and nontender. No masses, organomegaly or hernias noted. Genitalia: as per Urologist Musculoskeletal/extremities: No deformity or scoliosis noted of  the thoracic or lumbar spine.  No clubbing, cyanosis, edema, or significant extremity  deformity noted. Range of motion normal .Tone & strength  Normal. Joints normal. Nail health good. Able to lie down & sit up w/o help. Negative SLR  bilaterally Vascular: Carotid, radial artery, dorsalis pedis and  posterior tibial pulses are full and equal. No bruits present. Neurologic: Alert and oriented x3. Deep tendon reflexes symmetrical and normal.        Skin: Intact without suspicious lesions or rashes. Lymph: No cervical, axillary lymphadenopathy present. Psych: Mood and affect are normal. Normally interactive                                                                                     Assessment & Plan:  #1 comprehensive physical exam; no acute findings  Plan: see Orders  & Recommendations

## 2013-04-20 NOTE — Patient Instructions (Addendum)
Preventive Health Care: Exercise at least 30-45 minutes a day,  3-4 days a week.  Eat a low-fat diet with lots of fruits and vegetables, up to 7-9 servings per day. This would eliminate the need for vitamin supplements. Consume less than 40 grams of sugar (preferably ZERO) per day from foods & drinks with High Fructose Corn Sugar as #1,2,3 or # 4 on label.

## 2013-04-27 ENCOUNTER — Encounter: Payer: Self-pay | Admitting: Internal Medicine

## 2013-04-27 ENCOUNTER — Other Ambulatory Visit (INDEPENDENT_AMBULATORY_CARE_PROVIDER_SITE_OTHER): Payer: BC Managed Care – PPO

## 2013-04-27 DIAGNOSIS — Z0000 Encounter for general adult medical examination without abnormal findings: Secondary | ICD-10-CM

## 2013-04-27 LAB — CBC WITH DIFFERENTIAL/PLATELET
Basophils Absolute: 0 10*3/uL (ref 0.0–0.1)
Basophils Relative: 0.6 % (ref 0.0–3.0)
Eosinophils Absolute: 0.1 10*3/uL (ref 0.0–0.7)
Eosinophils Relative: 2.6 % (ref 0.0–5.0)
HCT: 47.3 % (ref 39.0–52.0)
Hemoglobin: 16.3 g/dL (ref 13.0–17.0)
Lymphocytes Relative: 30.6 % (ref 12.0–46.0)
Lymphs Abs: 1.6 10*3/uL (ref 0.7–4.0)
MCHC: 34.5 g/dL (ref 30.0–36.0)
MCV: 95.8 fl (ref 78.0–100.0)
Monocytes Absolute: 0.6 10*3/uL (ref 0.1–1.0)
Monocytes Relative: 10.7 % (ref 3.0–12.0)
Neutro Abs: 2.9 10*3/uL (ref 1.4–7.7)
Neutrophils Relative %: 55.5 % (ref 43.0–77.0)
Platelets: 174 10*3/uL (ref 150.0–400.0)
RBC: 4.93 Mil/uL (ref 4.22–5.81)
RDW: 13 % (ref 11.5–14.6)
WBC: 5.2 10*3/uL (ref 4.5–10.5)

## 2013-04-27 LAB — BASIC METABOLIC PANEL
BUN: 14 mg/dL (ref 6–23)
CO2: 31 mEq/L (ref 19–32)
Calcium: 9.2 mg/dL (ref 8.4–10.5)
Chloride: 104 mEq/L (ref 96–112)
Creatinine, Ser: 0.9 mg/dL (ref 0.4–1.5)
GFR: 88.23 mL/min (ref >60.00)
Glucose, Bld: 104 mg/dL — ABNORMAL HIGH (ref 70–99)
Potassium: 4.6 mEq/L (ref 3.5–5.1)
Sodium: 140 mEq/L (ref 135–145)

## 2013-04-27 LAB — HEPATIC FUNCTION PANEL
ALT: 26 U/L (ref 0–53)
AST: 24 U/L (ref 0–37)
Albumin: 4.3 g/dL (ref 3.5–5.2)
Alkaline Phosphatase: 53 U/L (ref 39–117)
Bilirubin, Direct: 0 mg/dL (ref 0.0–0.3)
Total Bilirubin: 0.5 mg/dL (ref 0.3–1.2)
Total Protein: 7.4 g/dL (ref 6.0–8.3)

## 2013-04-27 LAB — LIPID PANEL
Cholesterol: 165 mg/dL (ref 0–200)
HDL: 58.5 mg/dL (ref >39.00)
LDL Cholesterol: 98 mg/dL (ref 0–99)
Total CHOL/HDL Ratio: 3
Triglycerides: 45 mg/dL (ref 0.0–149.0)
VLDL: 9 mg/dL (ref 0.0–40.0)

## 2013-04-28 ENCOUNTER — Other Ambulatory Visit: Payer: Self-pay | Admitting: Internal Medicine

## 2013-04-28 ENCOUNTER — Ambulatory Visit: Payer: BC Managed Care – PPO

## 2013-04-28 DIAGNOSIS — R7309 Other abnormal glucose: Secondary | ICD-10-CM

## 2013-04-28 LAB — HEMOGLOBIN A1C: Hgb A1c MFr Bld: 5.6 % (ref 4.6–6.5)

## 2013-05-01 LAB — VITAMIN D 1,25 DIHYDROXY
Vitamin D 1, 25 (OH)2 Total: 47 pg/mL (ref 18–72)
Vitamin D2 1, 25 (OH)2: <8 pg/mL
Vitamin D3 1, 25 (OH)2: 47 pg/mL

## 2013-05-03 ENCOUNTER — Encounter: Payer: Self-pay | Admitting: Internal Medicine

## 2013-07-15 ENCOUNTER — Encounter: Payer: Self-pay | Admitting: Internal Medicine

## 2013-08-18 ENCOUNTER — Other Ambulatory Visit: Payer: Self-pay | Admitting: Internal Medicine

## 2013-12-10 ENCOUNTER — Other Ambulatory Visit: Payer: Self-pay | Admitting: Internal Medicine

## 2013-12-11 ENCOUNTER — Other Ambulatory Visit: Payer: Self-pay | Admitting: Internal Medicine

## 2013-12-18 ENCOUNTER — Telehealth: Payer: Self-pay | Admitting: Internal Medicine

## 2013-12-18 MED ORDER — OMEPRAZOLE 40 MG PO CPDR: 40.0000 mg | DELAYED_RELEASE_CAPSULE | Freq: Every day | ORAL | Status: DC

## 2013-12-18 NOTE — Telephone Encounter (Signed)
Sent omeprazole refill to pts pharmacy. Will need follow up for future refills

## 2013-12-25 ENCOUNTER — Other Ambulatory Visit: Payer: Self-pay | Admitting: Internal Medicine

## 2014-02-01 ENCOUNTER — Encounter: Payer: Self-pay | Admitting: Internal Medicine

## 2014-02-01 ENCOUNTER — Ambulatory Visit (INDEPENDENT_AMBULATORY_CARE_PROVIDER_SITE_OTHER): Payer: BC Managed Care – PPO | Admitting: Internal Medicine

## 2014-02-01 VITALS — BP 152/92 | HR 75 | Temp 97.9°F | Resp 14 | Wt 200.4 lb

## 2014-02-01 DIAGNOSIS — J209 Acute bronchitis, unspecified: Secondary | ICD-10-CM

## 2014-02-01 MED ORDER — AMOXICILLIN-POT CLAVULANATE 875-125 MG PO TABS: 1.0000 | ORAL_TABLET | Freq: Two times a day (BID) | ORAL | Status: DC

## 2014-02-01 NOTE — Progress Notes (Signed)
   Subjective:    Patient ID: Steve [name], male    DOB: [birth year], 63 y.o.   MRN: [id number]  HPI     Symptoms began 01/25/14 as rhinitis, head congestion, and chest congestion. He  As of 4/23 his cough was productive of green/brown sputum  Until 4/25 he experienced low-grade fevers up to 99.4 associated with chills and sweats  He believes that he was exposed to sick coworkers and associates.  He used nonsteroidals, emergency and zinc with partial response  At this time he continues to have discolored nasal secretions as well as cough with the purulent sputum. He notes significant lack of energy.    Review of Systems  He denies frontal headache, facial pain, dental pain, sore throat, earache, or otic discharge.  The cough is not associated with wheezing or shortness of breath.  He denies extrinsic symptoms of itchy, watery eyes, sneezing     Objective:   Physical Exam General appearance:good health ;well nourished; no acute distress or increased work of breathing is present.  No  lymphadenopathy about the head, neck, or axilla noted.   Eyes: No conjunctival inflammation or lid edema is present.  Ears:  External ear exam shows no significant lesions or deformities.  Otoscopic examination reveals clear canals, tympanic membranes are intact bilaterally without bulging, retraction, inflammation or discharge.  Nose:  External nasal examination shows no deformity or inflammation. Nasal mucosa are pink and moist without lesions or exudates. No septal dislocation or deviation.No obstruction to airflow.   Oral exam: Dental hygiene is good; lips and gums are healthy appearing.There is no oropharyngeal erythema or exudate noted. S/P uvulectomy  Neck:  No deformities,  masses, or tenderness noted.   Supple with full range of motion without pain.   Heart:  Normal rate and regular rhythm. S1 and S2 normal without gallop, murmur, click, rub or other extra sounds.   Lungs:Chest clear  to auscultation; no wheezes, rhonchi,rales ,or rubs present.No increased work of breathing.    Extremities:  No cyanosis, edema, or clubbing  noted   Neuro: intermittent slight R facial tic   Skin: Warm & dry w/o jaundice or tenting.         Assessment & Plan:  #1 acute bronchitis w/o bronchospasm #2 URI, acute Plan: See orders and recommendations

## 2014-02-01 NOTE — Progress Notes (Signed)
Pre visit review using our clinic review tool, if applicable. No additional management support is needed unless otherwise documented below in the visit note. 

## 2014-02-01 NOTE — Patient Instructions (Signed)

## 2014-02-05 ENCOUNTER — Other Ambulatory Visit: Payer: Self-pay | Admitting: Internal Medicine

## 2014-02-10 ENCOUNTER — Ambulatory Visit (INDEPENDENT_AMBULATORY_CARE_PROVIDER_SITE_OTHER): Payer: BC Managed Care – PPO | Admitting: Internal Medicine

## 2014-02-10 ENCOUNTER — Encounter: Payer: Self-pay | Admitting: Internal Medicine

## 2014-02-10 VITALS — BP 122/78 | HR 68 | Ht 74.0 in | Wt 201.0 lb

## 2014-02-10 DIAGNOSIS — R142 Eructation: Secondary | ICD-10-CM

## 2014-02-10 DIAGNOSIS — K2100 Gastro-esophageal reflux disease with esophagitis, without bleeding: Secondary | ICD-10-CM

## 2014-02-10 DIAGNOSIS — R143 Flatulence: Secondary | ICD-10-CM

## 2014-02-10 DIAGNOSIS — K222 Esophageal obstruction: Secondary | ICD-10-CM

## 2014-02-10 DIAGNOSIS — Z860100 Personal history of colon polyps, unspecified: Secondary | ICD-10-CM

## 2014-02-10 DIAGNOSIS — R141 Gas pain: Secondary | ICD-10-CM

## 2014-02-10 DIAGNOSIS — R1314 Dysphagia, pharyngoesophageal phase: Secondary | ICD-10-CM

## 2014-02-10 MED ORDER — OMEPRAZOLE 40 MG PO CPDR: 40.0000 mg | DELAYED_RELEASE_CAPSULE | Freq: Every day | ORAL | Status: DC

## 2014-02-10 NOTE — Progress Notes (Signed)
HISTORY OF PRESENT ILLNESS:  Steve [name] is a 63 y.o. male with past medical history as listed below. He is followed in this office for GERD complicated by erosive esophagitis and peptic stricture requiring esophageal dilation. As well, adenomatous colon polyps. He presents today for routine followup. He was last seen a little over 1 year ago. For his GERD he continues on omeprazole 40 mg daily. He reports only rare occasions of breakthrough reflux with dietary indiscretion. For this, he takes antacids. No recurrent dysphagia. His other complaint is increased intestinal gas. Report of this on his last visit. Trial of probiotic was not helpful. He has placed himself on lactose restriction without improvement. Gases principally in the form of flatus. No abdominal pain, change in bowel habits, or weight loss. No interval change in chronic medical problems.  REVIEW OF SYSTEMS:  All non-GI ROS negative upon review.  Past Medical History  Diagnosis Date  . Sleep apnea   . Hyperlipidemia   . Squamous cell skin cancer     Dr.King [place]  . Benign hypertrophy of prostate     Dr.Dahlstedt  . Hx of colonic polyp 2013    X2  . Diverticulosis 2013    Moderate   . Internal hemorrhoids 2013  . Ulcerative esophagitis 2013  . Stricture esophagus 2013     dilation X 1  . GERD (gastroesophageal reflux disease)     Past Surgical History  Procedure Laterality Date  . Nasal sinus surgery    . Uvulectomy & tonsillectomy  2008    Dr Wilburn Cornelia  . Torn biceps tendon      surgical repair  . Rotator cuff repair    . Colonoscopy w/ polypectomy  2013    tubular adenoma  . Colonoscopy w/ polypectomy  2008    hyperplastic    Social History MILEY LINDON  reports that he has quit smoking. He has never used smokeless tobacco. He reports that he drinks alcohol. He reports that he does not use illicit drugs.  family history includes Breast cancer in his mother; Diabetes in his daughter; Heart  attack in his maternal grandfather; Hyperlipidemia in his son; Prostate cancer in his father; Stroke in his father and paternal uncle; Uterine cancer in his mother. There is no history of Colon cancer, Rectal cancer, or Stomach cancer.  No Known Allergies     PHYSICAL EXAMINATION: Vital signs: BP 122/78  Pulse 68  Ht 6\' 2"  (1.88 m)  Wt 201 lb (91.173 kg)  BMI 25.80 kg/m2 General: Well-developed, well-nourished, no acute distress HEENT: Sclerae are anicteric, conjunctiva pink. Oral mucosa intact Lungs: Clear Heart: Regular Abdomen: soft, nontender, nondistended, no obvious ascites, no peritoneal signs, normal bowel sounds. No organomegaly. Extremities: No edema Psychiatric: alert and oriented x3. Cooperative   ASSESSMENT:  #1. GERD complicated by erosive esophagitis and peptic stricture requiring esophageal dilation. The most part, reflux symptoms controlled. No recurrent dysphagia #2. History of adenomatous polyps. Surveillance up-to-date #3. Incidental diverticulosis #4. Increased intestinal gas   PLAN:  #1. Continue reflux precautions #2. Continue omeprazole 40 mg daily. Prescription refilled #3. Discussion on intestinal gas. As well, supplemental literature as well as anti-gas and flatulence dietary sheet provided for his review #4. Surveillance colonoscopy 5 years from most recent exam #5. Routine GI followup in 1 year. Sooner if needed.

## 2014-02-10 NOTE — Patient Instructions (Signed)
We have sent the following medications to your pharmacy for you to pick up at your convenience:  Omeprazole  

## 2014-04-19 ENCOUNTER — Encounter: Payer: Self-pay | Admitting: Internal Medicine

## 2014-05-11 ENCOUNTER — Other Ambulatory Visit: Payer: Self-pay | Admitting: Internal Medicine

## 2014-10-08 DIAGNOSIS — I639 Cerebral infarction, unspecified: Secondary | ICD-10-CM

## 2014-10-08 HISTORY — DX: Cerebral infarction, unspecified: I63.9

## 2014-10-09 ENCOUNTER — Other Ambulatory Visit: Payer: Self-pay | Admitting: Internal Medicine

## 2014-11-09 ENCOUNTER — Other Ambulatory Visit: Payer: Self-pay | Admitting: Internal Medicine

## 2015-01-07 DIAGNOSIS — G459 Transient cerebral ischemic attack, unspecified: Secondary | ICD-10-CM

## 2015-01-07 HISTORY — DX: Transient cerebral ischemic attack, unspecified: G45.9

## 2015-02-07 ENCOUNTER — Encounter: Payer: Self-pay | Admitting: Internal Medicine

## 2015-02-07 ENCOUNTER — Other Ambulatory Visit: Payer: Self-pay | Admitting: Internal Medicine

## 2015-02-07 ENCOUNTER — Ambulatory Visit (INDEPENDENT_AMBULATORY_CARE_PROVIDER_SITE_OTHER): Payer: Managed Care, Other (non HMO) | Admitting: Internal Medicine

## 2015-02-07 ENCOUNTER — Other Ambulatory Visit (INDEPENDENT_AMBULATORY_CARE_PROVIDER_SITE_OTHER): Payer: Managed Care, Other (non HMO)

## 2015-02-07 VITALS — BP 146/90 | HR 81 | Temp 98.1°F | Resp 15 | Ht 74.0 in | Wt 202.1 lb

## 2015-02-07 DIAGNOSIS — Z0189 Encounter for other specified special examinations: Secondary | ICD-10-CM | POA: Diagnosis not present

## 2015-02-07 DIAGNOSIS — Z0000 Encounter for general adult medical examination without abnormal findings: Secondary | ICD-10-CM

## 2015-02-07 DIAGNOSIS — Z823 Family history of stroke: Secondary | ICD-10-CM

## 2015-02-07 DIAGNOSIS — R202 Paresthesia of skin: Secondary | ICD-10-CM | POA: Diagnosis not present

## 2015-02-07 LAB — CBC WITH DIFFERENTIAL/PLATELET
Basophils Absolute: 0 10*3/uL (ref 0.0–0.1)
Basophils Relative: 0.4 % (ref 0.0–3.0)
Eosinophils Absolute: 0.2 10*3/uL (ref 0.0–0.7)
Eosinophils Relative: 2.9 % (ref 0.0–5.0)
HCT: 46.7 % (ref 39.0–52.0)
Hemoglobin: 16 g/dL (ref 13.0–17.0)
Lymphocytes Relative: 20 % (ref 12.0–46.0)
Lymphs Abs: 1.6 10*3/uL (ref 0.7–4.0)
MCHC: 34.3 g/dL (ref 30.0–36.0)
MCV: 91.3 fl (ref 78.0–100.0)
Monocytes Absolute: 0.7 10*3/uL (ref 0.1–1.0)
Monocytes Relative: 8.3 % (ref 3.0–12.0)
Neutro Abs: 5.6 10*3/uL (ref 1.4–7.7)
Neutrophils Relative %: 68.4 % (ref 43.0–77.0)
Platelets: 175 10*3/uL (ref 150.0–400.0)
RBC: 5.12 Mil/uL (ref 4.22–5.81)
RDW: 13.1 % (ref 11.5–15.5)
WBC: 8.2 10*3/uL (ref 4.0–10.5)

## 2015-02-07 LAB — BASIC METABOLIC PANEL
BUN: 12 mg/dL (ref 6–23)
CO2: 26 mEq/L (ref 19–32)
Calcium: 9.4 mg/dL (ref 8.4–10.5)
Chloride: 103 mEq/L (ref 96–112)
Creatinine, Ser: 1.06 mg/dL (ref 0.40–1.50)
GFR: 74.5 mL/min (ref >60.00)
Glucose, Bld: 94 mg/dL (ref 70–99)
Potassium: 3.9 mEq/L (ref 3.5–5.1)
Sodium: 137 mEq/L (ref 135–145)

## 2015-02-07 LAB — TSH: TSH: 1.62 u[IU]/mL (ref 0.35–4.50)

## 2015-02-07 LAB — HEPATIC FUNCTION PANEL
ALT: 35 U/L (ref 0–53)
AST: 25 U/L (ref 0–37)
Albumin: 4.4 g/dL (ref 3.5–5.2)
Alkaline Phosphatase: 55 U/L (ref 39–117)
Bilirubin, Direct: 0.2 mg/dL (ref 0.0–0.3)
Total Bilirubin: 0.9 mg/dL (ref 0.2–1.2)
Total Protein: 7.4 g/dL (ref 6.0–8.3)

## 2015-02-07 NOTE — Progress Notes (Signed)
Pre visit review using our clinic review tool, if applicable. No additional management support is needed unless otherwise documented below in the visit note. 

## 2015-02-07 NOTE — Progress Notes (Signed)
Subjective:    Patient ID: Steve [name], male    DOB: 03/31/1951, 64 y.o.   MRN: [id number]  HPI  He is here for a physical;acute issues include recent paresthesias of RUE & face.  He awoke 01/28/15 with some numbness in the right upper extremity extending to the hand associated with decreased grip . He also had numbness in the right neck and face as well. He noted decreased fine motor skills in right hand during the same period. The facial paresthesias resolved over three days and the hand symptoms  over a week's time. He did not seek treatment for this.. He had not been taking the prophylactic 81 mg aspirin at the time of onset of the paresthesias.   Denied were any change in heart rhythm or rate prior to the event. There was no associated chest pain or shortness of breath .  He has been compliant with his statin without adverse effects. He is on heart healthy diet. He exercises 45-90 minutes 5-6 days a week without associated cardiopulmonary symptoms.  Family history is strongly positive for stroke in the paternal family, none premature.  His colonoscopy is up-to-date; he did have a tubular adenoma removed in 2013. He would be due 2018. He has a history of esophageal dilation for stricture. He remains on a PPI.       Review of Systems  Approximately 2.5 months ago he injured his right shoulder skiing. He has a history of rotator cuff surgery and has been performing the physical therapy exercises prescribed postoperatively.  Chest pain, palpitations, tachycardia, exertional dyspnea, paroxysmal nocturnal dyspnea, claudication or edema are absent.  Unexplained weight loss, abdominal pain, significant dyspepsia, dysphagia, melena, rectal bleeding, or persistently small caliber stools are denied.     Objective:   Physical Exam Gen.: Adequately nourished in appearance. Alert, appropriate and cooperative throughout exam. Appears younger than stated age  Head: Normocephalic without  obvious abnormalities;  no alopecia  Eyes: No corneal or conjunctival inflammation noted. Ptosis OD. Pupils equal round reactive to light and accommodation. Extraocular motion intact.  Ears: External  ear exam reveals no significant lesions or deformities. Canals clear .TMs normal. Hearing is grossly normal bilaterally. Nose: External nasal exam reveals no deformity or inflammation. Nasal mucosa are pink and moist. No lesions or exudates noted.   Mouth: Oral mucosa and oropharynx reveal no lesions or exudates. Teeth in good repair. Neck: No deformities, masses, or tenderness noted. Range of motion & Thyroid normal. Lungs: Normal respiratory effort; chest expands symmetrically. Lungs are clear to auscultation without rales, wheezes, or increased work of breathing. Heart: Normal rate and rhythm. Normal S1 and S2. No gallop, click, or rub.S 4 w/o murmur. Abdomen: Bowel sounds normal; abdomen soft and nontender. No masses, organomegaly or hernias noted. Genitalia: as per Urology                                Musculoskeletal/extremities: No deformity or scoliosis noted of  the thoracic or lumbar spine.  No clubbing, cyanosis, edema, or significant extremity  deformity noted.  Range of motion normal . Pain with posterior rotation of R shoulder Tone & strength normal. Hand joints normal.  Fingernail  health good. Able to lie down & sit up w/o help.  Negative SLR bilaterally Vascular: Carotid, radial artery, dorsalis pedis and  posterior tibial pulses are full and equal. No bruits present. Neurologic: Alert and oriented x3. Deep tendon reflexes  symmetrical and normal.  Nasolabial asymmetry. Sensation equal & normal to light touch normal over face & BUE Gait normal   Heel & toe walking .  Rhomberg & finger to nose negative      Skin: Intact without suspicious lesions or rashes. Lymph: No cervical, axillary lymphadenopathy present. Psych: Mood and affect are normal. Normally interactive                                                                                        Assessment & Plan:  #1 comprehensive physical exam; no acute findings #2 three-seven day long paresthesas of face & RUE 4/22-4/29/16; now resolved. Strong FH CVA among paternal relatives #3 acute R shoulder impingement syndrome  Plan: see Orders  & Recommendations

## 2015-02-07 NOTE — Patient Instructions (Addendum)
  Your next office appointment will be determined based upon review of your pending labs.  Those instructions will be transmitted to you by My Chart    Critical results will be called.   Followup as needed for any active or acute issue. Please report any significant change in your symptoms. 

## 2015-02-08 ENCOUNTER — Encounter: Payer: Self-pay | Admitting: Internal Medicine

## 2015-02-09 LAB — NMR LIPOPROFILE WITH LIPIDS
Cholesterol, Total: 183 mg/dL (ref 100–199)
HDL Particle Number: 38.4 umol/L (ref >=30.5)
HDL Size: 8.8 nm — ABNORMAL LOW (ref >=9.2)
HDL-C: 63 mg/dL (ref >39)
LDL (calc): 104 mg/dL — ABNORMAL HIGH (ref 0–99)
LDL Particle Number: 1333 nmol/L — ABNORMAL HIGH (ref <1000)
LDL Size: 20.9 nm (ref >=20.8)
LP-IR Score: 59 — ABNORMAL HIGH (ref <=45)
Large HDL-P: 5.5 umol/L (ref >=4.8)
Large VLDL-P: 3.1 nmol/L — ABNORMAL HIGH (ref <=2.7)
Small LDL Particle Number: 397 nmol/L (ref <=527)
Triglycerides: 81 mg/dL (ref 0–149)
VLDL Size: 49.2 nm — ABNORMAL HIGH (ref <=46.6)

## 2015-02-16 ENCOUNTER — Other Ambulatory Visit: Payer: Self-pay | Admitting: Internal Medicine

## 2015-03-03 ENCOUNTER — Ambulatory Visit (INDEPENDENT_AMBULATORY_CARE_PROVIDER_SITE_OTHER): Payer: Managed Care, Other (non HMO) | Admitting: Neurology

## 2015-03-03 ENCOUNTER — Encounter: Payer: Self-pay | Admitting: Neurology

## 2015-03-03 VITALS — BP 124/84 | HR 67 | Ht 74.0 in | Wt 199.1 lb

## 2015-03-03 DIAGNOSIS — R29898 Other symptoms and signs involving the musculoskeletal system: Secondary | ICD-10-CM

## 2015-03-03 DIAGNOSIS — G459 Transient cerebral ischemic attack, unspecified: Secondary | ICD-10-CM

## 2015-03-03 DIAGNOSIS — R270 Ataxia, unspecified: Secondary | ICD-10-CM

## 2015-03-03 DIAGNOSIS — M6289 Other specified disorders of muscle: Secondary | ICD-10-CM | POA: Diagnosis not present

## 2015-03-03 DIAGNOSIS — R278 Other lack of coordination: Secondary | ICD-10-CM

## 2015-03-03 NOTE — Patient Instructions (Addendum)
1.  MRI brain wo contast 2.  MRA head and carotids 3.  Echocardiogram, cardiac monitoring 4.  Continue aspirin 81mg  and zocor 40mg  daily 5.  Return to clinic in 1-2 months

## 2015-03-03 NOTE — Progress Notes (Signed)
[facility] Clinic Note - Initial Visit   Date: 03/03/2015   AMRO WINEBARGER MRN: [id number] DOB: May 11, 1951   Dear Dr. Linna Darner:  Thank you for your kind referral of BRODE SCULLEY for consultation of episode of right hand weakness. Although his history is well known to you, please allow [place] to reiterate it for the purpose of our medical record. The patient was accompanied to the clinic by self.   History of Present Illness: RODMAN RECUPERO is a 64 y.o. right-handed Caucasian male with right hemifacial spasm, hyperlipidemia, BPH, and GERD presenting for evaluation of transient right arm weakness and right facial paresthesias.    On April 23rd, he woke up with right arm heaviness, he was able to lift his arm. When he went to brush his teeth, he noticed that there was difficulty with applying pressure on his toothbrush. Moments later, he developed right cheek and torso tingling sensation. There was no weakness of the right lower extremity or the face. Denies any trouble swallowing, talking, or drooling. Symptoms resolved within about 6 hours.  He has noticed that his signature and hand writing is showing subtle differences.   He injured his right rotator cuff in March and thought symptoms could be related to that.  No associated slurred speech.  He stopped taking aspirin several years ago and restarted aspirin 81mg  when these symptoms started.    He saw Dr. Linna Darner, his PCP, in early May who referred him to see me because of concern of stroke.    He has seen Dr. Erling Cruz in the late 1990s for hemifacial spasm and gets botox injected at [facility] every three months.  It was last done about 6-8 weeks ago.  He has residual right facial weakness related to Botox.   Out-side paper records, electronic medical record, and images have been reviewed where available and summarized as:  Lab Results  Component Value Date   CHOL 183 02/07/2015   HDL 63 02/07/2015   LDLCALC 104* 02/07/2015   LDLDIRECT 172.0 07/24/2007   TRIG 81 02/07/2015   CHOLHDL 3 04/27/2013   Lab Results  Component Value Date   HGBA1C 5.6 04/28/2013   Lab Results  Component Value Date   TSH 1.62 02/07/2015     Past Medical History  Diagnosis Date  . Sleep apnea   . Hyperlipidemia   . Squamous cell skin cancer     Dr.King [place]  . Benign hypertrophy of prostate     Dr.Dahlstedt  . Hx of colonic polyp 2008 & 2013    X2  . Diverticulosis 2013    Moderate   . Internal hemorrhoids 2013  . Ulcerative esophagitis 2013  . Stricture esophagus 2013     dilation X 1  . GERD (gastroesophageal reflux disease)     Past Surgical History  Procedure Laterality Date  . Nasal sinus surgery    . Uvulectomy & tonsillectomy  2008    Dr Wilburn Cornelia  . Torn biceps tendon      surgical repair  . Rotator cuff repair    . Colonoscopy w/ polypectomy  2013    tubular adenoma  . Colonoscopy w/ polypectomy  2008    hyperplastic     Medications:  Current Outpatient Prescriptions on File Prior to Visit  Medication Sig Dispense Refill  . aspirin 81 MG tablet Take 81 mg by mouth daily.      . fluticasone (FLONASE) 50 MCG/ACT nasal spray ONE SPRAY IN EACH NOSTRIL TWICE  DAILY ASNEEDED -USE THE "CROSSOVER" TECHNIQUE DISCUSSED 16 g 5  . omeprazole (PRILOSEC) 40 MG capsule TAKE ONE CAPSULE EACH DAY 30 capsule 3  . simvastatin (ZOCOR) 40 MG tablet TAKE ONE TABLET AT BEDTIME 90 tablet 1  . vardenafil (LEVITRA) 20 MG tablet Take 20 mg by mouth daily as needed.       No current facility-administered medications on file prior to visit.    Allergies: No Known Allergies  Family History: Family History  Problem Relation Age of Onset  . Uterine cancer Mother   . Breast cancer Mother   . Stroke Father     > 59  . Prostate cancer Father   . Diabetes Daughter     type 1  . Stroke Paternal Uncle     > 55  . Heart attack Maternal Grandfather      >55  . Colon cancer Neg Hx   .  Rectal cancer Neg Hx   . Stomach cancer Neg Hx   . Hyperlipidemia Son     Social History: History   Social History  . Marital Status: Married    Spouse Name: N/A  . Number of Children: N/A  . Years of Education: N/A   Occupational History  . [employer]IDENT [employer]    Social History Main Topics  . Smoking status: Former Research scientist (life sciences)  . Smokeless tobacco: Never Used     Comment: smoked 1972-1982, up to 1/2 ppd  . Alcohol Use: 3.0 - 3.6 oz/week    5-6 Standard drinks or equivalent per week     Comment: SOCIALLY  . Drug Use: No  . Sexual Activity: Not on file   Other Topics Concern  . Not on file   Social History Narrative   GETS REG EXERCISE 4x weekly   NO DIET   Lives with wife in a 2 story home.  Has 2 children.  Works as a Insurance underwriter.  Education: college          Review of Systems:  CONSTITUTIONAL: No fevers, chills, night sweats, or weight loss.   EYES: No visual changes or eye pain ENT: No hearing changes.  No history of nose bleeds.   RESPIRATORY: No cough, wheezing and shortness of breath.   CARDIOVASCULAR: Negative for chest pain, and palpitations.   GI: Negative for abdominal discomfort, blood in stools or black stools.  No recent change in bowel habits.   GU:  No history of incontinence.   MUSCLOSKELETAL: No history of joint pain or swelling.  No myalgias.   SKIN: Negative for lesions, rash, and itching.   HEMATOLOGY/ONCOLOGY: Negative for prolonged bleeding, bruising easily, and swollen nodes.  No history of cancer.   ENDOCRINE: Negative for cold or heat intolerance, polydipsia or goiter.   PSYCH:  No depression or anxiety symptoms.   NEURO: As Above.   Vital Signs:  BP 124/84 mmHg  Pulse 67  Ht 6\' 2"  (1.88 m)  Wt 199 lb 2 oz (90.323 kg)  BMI 25.56 kg/m2  SpO2 96%   General Medical Exam:   General:  Well appearing, comfortable.   Eyes/ENT: see cranial nerve examination.   Neck: No masses appreciated.  Full range of motion without  tenderness.  No carotid bruits. Respiratory:  Clear to auscultation, good air entry bilaterally.   Cardiac:  Regular rate and rhythm, no murmur.   Extremities:  No deformities, edema, or skin discoloration.  Skin:  No rashes or lesions.  Neurological Exam: MENTAL STATUS including orientation to  time, place, person, recent and remote memory, attention span and concentration, language, and fund of knowledge is normal.  Speech is not dysarthric.  CRANIAL NERVES: II:  No visual field defects.  Unremarkable fundi.   III-IV-VI: Pupils equal round and reactive to light.  Normal conjugate, extra-ocular eye movements in all directions of gaze.  Right ptosis at baseline.    V:  Normal facial sensation.  VII:  Right facial asymmetry involving the forehead and cheek, there is trace wrinkling of the frontalis muscle and the right,, weakness of the buccinator, asymmetrical smile on the right (chemodenervation due to Botox) Motor movements are intact on the left. There is intermittent right hemifacial spasm.  VIII:  Normal hearing and vestibular function.   IX-X:  Normal palatal movement.   XI:  Normal shoulder shrug and head rotation.   XII:  Normal tongue strength and range of motion, no deviation or fasciculation.  MOTOR:  No atrophy, fasciculations or abnormal movements.  No pronator drift.  Tone is normal.    Right Upper Extremity:    Left Upper Extremity:    Deltoid  5/5   Deltoid  5/5   Biceps  5/5   Biceps  5/5   Triceps  5/5   Triceps  5/5   Wrist extensors  5/5   Wrist extensors  5/5   Wrist flexors  5/5   Wrist flexors  5/5   Finger extensors  5/5   Finger extensors  5/5   Finger flexors  5/5   Finger flexors  5/5   Dorsal interossei  5/5   Dorsal interossei  5/5   Abductor pollicis  5/5   Abductor pollicis  5/5   Tone (Ashworth scale)  0  Tone (Ashworth scale)  0   Right Lower Extremity:    Left Lower Extremity:    Hip flexors  5/5   Hip flexors  5/5   Hip extensors  5/5   Hip  extensors  5/5   Knee flexors  5/5   Knee flexors  5/5   Knee extensors  5/5   Knee extensors  5/5   Dorsiflexors  5/5   Dorsiflexors  5/5   Plantarflexors  5/5   Plantarflexors  5/5   Toe extensors  5/5   Toe extensors  5/5   Toe flexors  5/5   Toe flexors  5/5   Tone (Ashworth scale)  0  Tone (Ashworth scale)  0   MSRs:  Right                                                                 Left brachioradialis 2+  brachioradialis 2+  biceps 2+  biceps 2+  triceps 2+  triceps 2+  patellar 2+  patellar 2+  ankle jerk 2+  ankle jerk 2+  Hoffman no  Hoffman no  plantar response down  plantar response down   SENSORY:  Normal and symmetric perception of light touch, pinprick, vibration, and proprioception.  Romberg's sign absent.   COORDINATION/GAIT: Normal finger-to- nose-finger and heel-to-shin.  Intact rapid alternating movements bilaterally.  Able to rise from a chair without using arms.  Gait narrow based and stable. Tandem and stressed gait intact.    IMPRESSION: Mr. Heard is a 64 year old gentleman with  right hemifacial spasm presenting for evaluation of episodic right arm weakness and sensory changes involving the right face and abdomen. Symptoms occurred in April and resolved within several hours. Since then, he has noted subtle difficulty with fine motor movement such as writing and manipulating small objects. 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 Otherwise, there are no new neurological deficits that I can appreciate on his exam.  By history, TIA is highest on the differential and stroke risk factors and mechanism workup needs to be investigated, as noted below.  Stroke risk factors include hyperlipidemia (LDL 104), gender, age, remote tobacco use, and family history of stroke.   PLAN/RECOMMENDATIONS:  1.  MRI brain wo contast 2.  MRA head and carotids 3.  Echocardiogram, cardiac monitoring 4.  Continue  aspirin 81mg  and zocor 40mg  daily 5.  Stroke warning signs discussed  6.  Return to clinic in 1-2 months    The duration of this appointment visit was 60 minutes of face-to-face time with the patient.  Greater than 50% of this time was spent in counseling, explanation of diagnosis, planning of further management, and coordination of care.   Thank you for allowing me to participate in patient's care.  If I can answer any additional questions, I would be pleased to do so.    Sincerely,    Wendy Hoback K. Posey Pronto, DO

## 2015-03-14 ENCOUNTER — Ambulatory Visit (HOSPITAL_COMMUNITY): Payer: Managed Care, Other (non HMO) | Attending: Neurology

## 2015-03-14 ENCOUNTER — Other Ambulatory Visit: Payer: Self-pay

## 2015-03-14 DIAGNOSIS — G459 Transient cerebral ischemic attack, unspecified: Secondary | ICD-10-CM | POA: Diagnosis present

## 2015-03-14 DIAGNOSIS — R270 Ataxia, unspecified: Secondary | ICD-10-CM | POA: Diagnosis not present

## 2015-03-14 DIAGNOSIS — R29898 Other symptoms and signs involving the musculoskeletal system: Secondary | ICD-10-CM

## 2015-03-14 DIAGNOSIS — M6289 Other specified disorders of muscle: Secondary | ICD-10-CM

## 2015-03-14 DIAGNOSIS — R278 Other lack of coordination: Secondary | ICD-10-CM

## 2015-03-17 ENCOUNTER — Telehealth: Payer: Self-pay | Admitting: Neurology

## 2015-03-17 NOTE — Telephone Encounter (Signed)
PA reference # [id number]  For MRI/A head and MRA neck

## 2015-03-17 NOTE — Telephone Encounter (Signed)
Authorization ID [id number], expires 06/14/2015, facility [facility]

## 2015-03-18 ENCOUNTER — Ambulatory Visit
Admission: RE | Admit: 2015-03-18 | Discharge: 2015-03-18 | Disposition: A | Discharge status: Home / Self Care | Payer: 59 | Source: Ambulatory Visit | Attending: Neurology | Admitting: Neurology

## 2015-03-18 ENCOUNTER — Telehealth: Payer: Self-pay | Admitting: Neurology

## 2015-03-18 DIAGNOSIS — G459 Transient cerebral ischemic attack, unspecified: Secondary | ICD-10-CM

## 2015-03-18 DIAGNOSIS — R278 Other lack of coordination: Secondary | ICD-10-CM

## 2015-03-18 DIAGNOSIS — R29898 Other symptoms and signs involving the musculoskeletal system: Secondary | ICD-10-CM

## 2015-03-18 MED ORDER — GADOBENATE DIMEGLUMINE 529 MG/ML IV SOLN
18.0000 mL | Freq: Once | INTRAVENOUS | Status: AC | PRN
  Administered 2015-03-18: 18 mL via INTRAVENOUS

## 2015-03-18 NOTE — Telephone Encounter (Signed)
Attempted to call patient back.  Left message for him to call me back.

## 2015-03-18 NOTE — Telephone Encounter (Signed)
Pt called has some more info to pass on to Dr Patel/Dawn CB# [phone]

## 2015-03-21 ENCOUNTER — Other Ambulatory Visit: Payer: Self-pay | Admitting: *Deleted

## 2015-03-21 DIAGNOSIS — G459 Transient cerebral ischemic attack, unspecified: Secondary | ICD-10-CM

## 2015-03-22 ENCOUNTER — Other Ambulatory Visit: Payer: Self-pay | Admitting: *Deleted

## 2015-03-22 ENCOUNTER — Other Ambulatory Visit (HOSPITAL_COMMUNITY): Payer: 59

## 2015-03-22 DIAGNOSIS — R42 Dizziness and giddiness: Secondary | ICD-10-CM

## 2015-03-23 ENCOUNTER — Encounter: Payer: Self-pay | Admitting: Internal Medicine

## 2015-03-23 ENCOUNTER — Ambulatory Visit (INDEPENDENT_AMBULATORY_CARE_PROVIDER_SITE_OTHER): Payer: 59 | Admitting: Internal Medicine

## 2015-03-23 VITALS — BP 140/84 | HR 71 | Temp 98.0°F | Resp 16 | Wt 198.0 lb

## 2015-03-23 DIAGNOSIS — K5730 Diverticulosis of large intestine without perforation or abscess without bleeding: Secondary | ICD-10-CM | POA: Diagnosis not present

## 2015-03-23 DIAGNOSIS — R300 Dysuria: Secondary | ICD-10-CM

## 2015-03-23 DIAGNOSIS — R030 Elevated blood-pressure reading, without diagnosis of hypertension: Secondary | ICD-10-CM | POA: Diagnosis not present

## 2015-03-23 DIAGNOSIS — R1030 Lower abdominal pain, unspecified: Secondary | ICD-10-CM | POA: Diagnosis not present

## 2015-03-23 LAB — POCT URINALYSIS DIPSTICK
Bilirubin, UA: NEGATIVE
Blood, UA: NEGATIVE
Glucose, UA: NEGATIVE
Ketones, UA: NEGATIVE
Leukocytes, UA: NEGATIVE
Nitrite, UA: NEGATIVE
Protein, UA: NEGATIVE
Spec Grav, UA: 1.02
Urobilinogen, UA: 0.2
pH, UA: 6

## 2015-03-23 MED ORDER — METRONIDAZOLE 500 MG PO TABS: 500.0000 mg | ORAL_TABLET | Freq: Three times a day (TID) | ORAL | Status: DC

## 2015-03-23 MED ORDER — CIPROFLOXACIN HCL 500 MG PO TABS: 500.0000 mg | ORAL_TABLET | Freq: Two times a day (BID) | ORAL | Status: DC

## 2015-03-23 NOTE — Progress Notes (Signed)
   Subjective:    Patient ID: Steve [name], male    DOB: [birth year], 64 y.o.   MRN: [id number]  HPI He describes pressure in the lower abdomen for 3 days that improves with urination. It is not eliminated totally with urination however. Sitting causes an exacerbation of the pressure symptoms. He has seen a Urologist who described obstructive lower urinary tract symptoms. He denies any obstructive symptoms or difficulty starting the stream, hesitancy, urgency, or dribbling. He has a follow-up appointment with the Urologist 6/27.  He denies any other GI or genitourinary symptoms. His colonoscopy 2013 revealed a diminutive tubular adenoma and diverticulosis.  He has seen the Neurologist who diagnosed an old stroke. She recommended only baby aspirin. She has performed an echocardiogram which revealed no significant valvular  lesions. She has recommended an event Holter monitor. He has noted his blood pressure has been rising lately.   Review of Systems Dysuria, pyuria, hematuria, frequency, nocturia or polyuria are denied. Unexplained weight loss, abdominal pain, significant dyspepsia, dysphagia, melena, rectal bleeding, or persistently small caliber stools are denied.     Objective:   Physical Exam  Pertinent or positive findings include: He does have facial asymmetry & intermittent facial tic. Palpation of the abdomen revealed some tenderness in the left lower quadrant. With deep palpation in the right lower quadrant he experienced some pressure sensation in the left lower quadrant. Prostate is normal to palpation. He has no adnexal tenderness. Genital exam is unremarkable except for varices on the left.  General appearance :adequately nourished; in no distress.  Eyes: No conjunctival inflammation or scleral icterus is present.  Oral exam:  Lips and gums are healthy appearing.There is no oropharyngeal erythema or exudate noted. Dental hygiene is good.S/P uvulectomy.  Heart:  Normal rate  and regular rhythm. S1 and S2 normal without gallop, murmur, click, rub or other extra sounds    Lungs:Chest clear to auscultation; no wheezes, rhonchi,rales ,or rubs present.No increased work of breathing.   Abdomen: bowel sounds normal, soft  without masses, organomegaly or hernias noted.  No guarding or rebound. No flank tenderness to percussion.  Vascular : all pulses equal ; no bruits present.  Skin:Warm & dry.  Intact without suspicious lesions or rashes ; no tenting or jaundice   Lymphatic: No lymphadenopathy is noted about the head, neck, axilla, or inguinal areas.   Neuro: Strength, tone & DTRs normal.        Assessment & Plan:  #1 suprapubic & LLQ discomfort  with negative genitalia and digital rectal exams,ie no evidence of epididymitis or prostatitis  #2 history of diverticulosis  Plan: He'll be treated for possible low-grade diverticulitis.

## 2015-03-23 NOTE — Patient Instructions (Signed)
Stay on clear liquids for 48-72 hours or until pain gone.This would include  jello, sherbert (NOT ice cream), Lipton's chicken noodle soup(NOT cream based soups),Gatorade Lite, flat Ginger ale (without High Fructose Corn Syrup),dry toast or crackers, baked potato.No milk , dairy or grease during this period.  Minimal Blood Pressure Goal= AVERAGE < 140/90;  Ideal is an AVERAGE < 135/85. This AVERAGE should be calculated from @ least 5-7 BP readings taken @ different times of day on different days of week. You should not respond to isolated BP readings , but rather the AVERAGE for that week .Please bring your  blood pressure cuff to office visits to verify that it is reliable.It  can also be checked against the blood pressure device at the pharmacy. Finger or wrist cuffs are not dependable; an arm cuff is.

## 2015-03-23 NOTE — Progress Notes (Signed)
Pre visit review using our clinic review tool, if applicable. No additional management support is needed unless otherwise documented below in the visit note. 

## 2015-03-25 ENCOUNTER — Ambulatory Visit (INDEPENDENT_AMBULATORY_CARE_PROVIDER_SITE_OTHER): Payer: 59 | Admitting: Neurology

## 2015-03-25 ENCOUNTER — Encounter: Payer: Self-pay | Admitting: Neurology

## 2015-03-25 VITALS — BP 138/84 | HR 60 | Ht 74.0 in | Wt 198.1 lb

## 2015-03-25 DIAGNOSIS — I639 Cerebral infarction, unspecified: Secondary | ICD-10-CM | POA: Diagnosis not present

## 2015-03-25 DIAGNOSIS — R278 Other lack of coordination: Secondary | ICD-10-CM

## 2015-03-25 DIAGNOSIS — R270 Ataxia, unspecified: Secondary | ICD-10-CM | POA: Diagnosis not present

## 2015-03-25 DIAGNOSIS — R42 Dizziness and giddiness: Secondary | ICD-10-CM | POA: Diagnosis not present

## 2015-03-25 NOTE — Progress Notes (Signed)
Follow-up Visit   Date: 03/25/2015    Steve [name] MRN: [id number] DOB: [birth year]   Interim History: Steve [name] is a 64 y.o. right-handed Caucasian male with right hemifacial spasm, hyperlipidemia, BPH, and GERD returning to the clinic for follow-up of stroke manifesting with transient right arm weakness and facial paresthesias.  The patient was accompanied to the clinic by self.  History of present illness: On April 23rd, he woke up with right arm heaviness, he was able to lift his arm. When he went to brush his teeth, he noticed that there was difficulty with applying pressure on his toothbrush. Moments later, he developed right cheek and torso tingling sensation. There was no weakness of the right lower extremity or the face. Denies any trouble swallowing, talking, or drooling. Symptoms resolved within about 6 hours. He has noticed that his signature and hand writing is showing subtle differences. He injured his right rotator cuff in March and thought symptoms could be related to that. No associated slurred speech. He stopped taking aspirin several years ago and restarted aspirin 81mg  when these symptoms started.   He saw Dr. Linna Darner, his PCP, in early May who referred him to see me because of concern of stroke.   He has seen Dr. Erling Cruz in the late 1990s for hemifacial spasm and gets botox injected at [facility] every three months. It was last done about 6-8 weeks ago. He has residual right facial weakness related to Botox.   UPDATE 03/24/2014:  Patient is here to discuss the results of his testing which showed a small left centrum semiovale stroke.  There is no evidence of large vessel occlusion. Echocardiogram is normal except moderate right-to-left shunt without atrial septal aneurysm. He has no new complaints, and in fact reports fine motor movements have improved. He is here with multiple questions and concerns regarding ongoing workup.  Medications:  Current  Outpatient Prescriptions on File Prior to Visit  Medication Sig Dispense Refill  . aspirin 81 MG tablet Take 81 mg by mouth daily.      . fluticasone (FLONASE) 50 MCG/ACT nasal spray ONE SPRAY IN EACH NOSTRIL TWICE DAILY ASNEEDED -USE THE "CROSSOVER" TECHNIQUE DISCUSSED 16 g 5  . metroNIDAZOLE (FLAGYL) 500 MG tablet Take 1 tablet (500 mg total) by mouth 3 (three) times daily. 21 tablet 0  . omeprazole (PRILOSEC) 40 MG capsule TAKE ONE CAPSULE EACH DAY 30 capsule 3  . simvastatin (ZOCOR) 40 MG tablet TAKE ONE TABLET AT BEDTIME 90 tablet 1  . vardenafil (LEVITRA) 20 MG tablet Take 20 mg by mouth daily as needed.       No current facility-administered medications on file prior to visit.    Allergies: No Known Allergies  Review of Systems:  CONSTITUTIONAL: No fevers, chills, night sweats, or weight loss.  EYES: No visual changes or eye pain ENT: No hearing changes.  No history of nose bleeds.   RESPIRATORY: No cough, wheezing and shortness of breath.   CARDIOVASCULAR: Negative for chest pain, and palpitations.   GI: Negative for abdominal discomfort, blood in stools or black stools.  No recent change in bowel habits.   GU:  No history of incontinence.   MUSCLOSKELETAL: No history of joint pain or swelling.  No myalgias.   SKIN: Negative for lesions, rash, and itching.   ENDOCRINE: Negative for cold or heat intolerance, polydipsia or goiter.   PSYCH:  No depression or anxiety symptoms.   NEURO: As Above.   Vital Signs:  BP 138/84 mmHg  Pulse 60  Ht 6\' 2"  (1.88 m)  Wt 198 lb 2 oz (89.869 kg)  BMI 25.43 kg/m2  SpO2 98%   General: Well appearing CV: regular rate and rythm  Neurological Exam: MENTAL STATUS including orientation to time, place, person, recent and remote memory, attention span and concentration, language, and fund of knowledge is normal.  Speech is not dysarthric.  CRANIAL NERVES:  Pupils equal round and reactive to light.  Normal conjugate, extra-ocular eye movements  in all directions of gaze. Right facial asymmetry involving the forehead and cheek, there is trace wrinkling of the frontalis muscle and the right, weakness of the buccinator, asymmetrical smile on the right (chemodenervation due to Botox) Motor movements are intact on the left. There is intermittent right hemifacial spasm.  Palate elevates symmetrically.  Tongue is midline.  MOTOR:  Motor strength is 5/5 in all extremities.  No pronator drift.  Tone is normal.    MSRs:  Reflexes are 2+/4 throughout.  COORDINATION/GAIT:  Normal finger-to- nose-finger and heel-to-shin.  Intact rapid alternating movements bilaterally.  Gait narrow based and stable.   Data: MRI/A brain and neck 03/18/2015: 1. Mild white matter signal changes in the left posterior centrum semiovale with subtle diffusion signal changes suggestive of subacute white matter infarct. No mass effect or hemorrhage. 2. Otherwise normal for age noncontrast MRI appearance of the brain. 3. Left greater than right carotid bifurcation atherosclerosis with no hemodynamically significant stenosis in the neck. 4. Negative intracranial MRA.  Lab Results  Component Value Date   CHOL 183 02/07/2015   HDL 63 02/07/2015   LDLCALC 104* 02/07/2015   LDLDIRECT 172.0 07/24/2007   TRIG 81 02/07/2015   CHOLHDL 3 04/27/2013   Lab Results  Component Value Date   HGBA1C 5.6 04/28/2013   Lab Results  Component Value Date   TSH 1.62 02/07/2015   Echocardiogram 03/14/2015: - Left ventricle: The cavity size was normal. Systolic function was normal. The estimated ejection fraction was in the range of 55%to 60%. Wall motion was normal; there were no regional wall motion abnormalities. Left ventricular diastolic function parameters were normal. - Atrial septum: There was a moderate right-to-left atrial level shunt, in the baseline state. There was redundancy of the septum, with borderline criteria for aneurysm.  IMPRESSION/PLAN: 1.  Small left  posterior centrum semiovale stroke manfisting with transient right arm weakness and hemisensory loss (April 2016).  No evidence of large artery occlusion, echo is normal except for right to left shunt which I doubt is clinically significant unless he develops new events in which case systemic work-up will need to be done.  Mechanism unclear, need to evaluate for arrhythmia especially given the small size of the infarct.  Less likely small vessel disease.  Reviewed images and all the results in detail as well as answered all his questions to the best of my ability. We had a very lengthy discussion regarding cardiac monitoring and he does not wish to have 30-day monitor due to concerns that his co-workers will have about his health, so has elected to have 48-hr holter monitor. Clinically, he is doing well with no residual deficits. Stroke risk factors include hyperlipidemia (LDL 104), gender, age, remote tobacco use, and family history of stroke. Continue aspirin 81mg  and zocor 40mg  daily  2.  Right hemifacial spasm, followed at [facility]  Return to clinic in 3 months   The duration of this appointment visit was 40 minutes of face-to-face time with the patient.  Greater than 50% of this time was spent in counseling, explanation of diagnosis, planning of further management, and coordination of care.   Thank you for allowing me to participate in patient's care.  If I can answer any additional questions, I would be pleased to do so.    Sincerely,    Eulalio Reamy K. Posey Pronto, DO

## 2015-03-25 NOTE — Patient Instructions (Signed)
1.  Holter monitor - 48 hour 2.  Continue medications 3.  Return to clinic in 3 months, results will be communicated to you via telephone

## 2015-03-29 ENCOUNTER — Ambulatory Visit (INDEPENDENT_AMBULATORY_CARE_PROVIDER_SITE_OTHER): Payer: 59

## 2015-03-29 DIAGNOSIS — I639 Cerebral infarction, unspecified: Secondary | ICD-10-CM | POA: Diagnosis not present

## 2015-03-29 DIAGNOSIS — R42 Dizziness and giddiness: Secondary | ICD-10-CM | POA: Diagnosis not present

## 2015-04-04 ENCOUNTER — Other Ambulatory Visit: Payer: Self-pay

## 2015-04-11 ENCOUNTER — Encounter: Payer: Self-pay | Admitting: Neurology

## 2015-04-12 ENCOUNTER — Encounter: Payer: Self-pay | Admitting: Neurology

## 2015-04-13 ENCOUNTER — Encounter: Payer: Self-pay | Admitting: Internal Medicine

## 2015-04-13 NOTE — Telephone Encounter (Signed)
Closing note:  Pt that email was regarding has been scheduled and seen by MD.

## 2015-04-20 ENCOUNTER — Telehealth: Payer: Self-pay

## 2015-04-20 NOTE — Telephone Encounter (Signed)
PLEASE NOTE: All timestamps contained within this report are represented as [place] Standard Time. CONFIDENTIALTY NOTICE: This fax transmission is intended only for the addressee. It contains information that is legally privileged, confidential or otherwise protected from use or disclosure. If you are not the intended recipient, you are strictly prohibited from reviewing, disclosing, copying using or disseminating any of this information or taking any action in reliance on or regarding this information. If you have received this fax in error, please notify us immediately by telephone so that we can arrange for its return to [place]. Phone: [phone], Toll-Free: [phone], Fax: [phone] Page: 1 of 1 Call Id: [id number] [place] Patient Name: Steve [name] Gender: Male DOB: 07/26/1983 Age: 64 Y 41 M 18 D Return Phone Number: Address: City/State/Zip:  Client [facility] [facility] - Client Client Site [facility] - Night Physician Plotnikov, Alex Contact Type Call Call Type Triage / Clinical Caller Name Steve [name] Relationship To Patient Provider Return Phone Number Please choose phone number Chief Complaint Lab Result (Critical) Initial Comment Caller states she is Steve [name] from [facility] calling to report STAT lab values [phone] Nurse Assessment Nurse: Donovan Kail, RN, Barnetta Chapel Date/Time (Eastern Time): 04/13/2015 8:32:14 PM Is there an on-call provider listed? ---Yes Please list name of person reporting value (Lab Employee) and a contact number. ---Caller states she is Steve [name] from [employer] to report STAT lab values [phone] Please document the following items: Lab name Lab value (read back to lab to verify) Reference range for lab value Date and time blood was drawn ---D dimer stat 0.27 (0-0.48) Drawn: today at 16:49 Please collect the patient  contact information from the lab. (name, phone number and address) ---Trudee Kuster [phone] Guidelines Guideline Title Affirmed Question Affirmed Notes Nurse Date/Time ([place] Time) Disp. Time Eilene Ghazi Time) Disposition Final User 04/13/2015 8:41:02 PM Lab Call Donovan Kail, RN, Barnetta Chapel Reason: normal d dimer 04/13/2015 8:41:12 PM Clinical Call Yes Donovan Kail, RN, Barnetta Chapel After Care Instructions Given Call Event Type User Date / Time Description

## 2015-05-13 ENCOUNTER — Other Ambulatory Visit: Payer: Self-pay

## 2015-05-13 ENCOUNTER — Other Ambulatory Visit: Payer: Self-pay | Admitting: Internal Medicine

## 2015-05-13 MED ORDER — SIMVASTATIN 40 MG PO TABS: 40.0000 mg | ORAL_TABLET | Freq: Every day | ORAL | Status: DC

## 2015-07-27 ENCOUNTER — Other Ambulatory Visit: Payer: Self-pay | Admitting: Internal Medicine

## 2015-09-30 ENCOUNTER — Ambulatory Visit (INDEPENDENT_AMBULATORY_CARE_PROVIDER_SITE_OTHER): Payer: PRIVATE HEALTH INSURANCE | Admitting: Internal Medicine

## 2015-09-30 ENCOUNTER — Encounter: Payer: Self-pay | Admitting: Internal Medicine

## 2015-09-30 VITALS — BP 136/82 | HR 74 | Temp 97.9°F | Resp 16 | Wt 204.0 lb

## 2015-09-30 DIAGNOSIS — Z8673 Personal history of transient ischemic attack (TIA), and cerebral infarction without residual deficits: Secondary | ICD-10-CM | POA: Insufficient documentation

## 2015-09-30 DIAGNOSIS — Z23 Encounter for immunization: Secondary | ICD-10-CM | POA: Diagnosis not present

## 2015-09-30 DIAGNOSIS — G458 Other transient cerebral ischemic attacks and related syndromes: Secondary | ICD-10-CM

## 2015-09-30 DIAGNOSIS — R931 Abnormal findings on diagnostic imaging of heart and coronary circulation: Secondary | ICD-10-CM | POA: Insufficient documentation

## 2015-09-30 DIAGNOSIS — E782 Mixed hyperlipidemia: Secondary | ICD-10-CM

## 2015-09-30 MED ORDER — SIMVASTATIN 40 MG PO TABS: 40.0000 mg | ORAL_TABLET | Freq: Every day | ORAL | Status: DC

## 2015-09-30 NOTE — Progress Notes (Signed)
Pre visit review using our clinic review tool, if applicable. No additional management support is needed unless otherwise documented below in the visit note. 

## 2015-09-30 NOTE — Patient Instructions (Signed)
The Cardiology referral will be scheduled and you'll be notified of the time.Please call the Referral Co-Ordinator @ [phone] if you have not been notified of appointment time within 7-10 days. 

## 2015-09-30 NOTE — Progress Notes (Signed)
   Subjective:    Patient ID: Steve [name], male    DOB: 11-Jan-1951, 64 y.o.   MRN: [id number]  HPI The patient is here to assess status of active health conditions.  PMH, FH, & Social History reviewed & updated.No change in Steve [name] as recorded.  He denies any residual fine motor deficit from his TIA in April. The results of the MRI/MRA,ECHO, & 48-hour event monitor were reviewed. The monitor did reveal occasional PVC.The echo suggested a right-to-left atrial shunt and possible septal aneurysm. The MRA suggested left posterior centrum semi-ovale white matter infarct.  He states he works out 6 days a week; 4 days with a trainer and 1-2 days as yoga or Pilates. He also walks 2-3 miles over the weekend. He has no cardiopulmonary symptoms with this  In fact he states he gets his pulse up to 140 without any dysrhythmias. He also has a quick recovery time.  He is on a sodium restricted diet. He has red meat 1-2 times per week.  His father did have a stroke.  He is considering surgery for rotator cuff on the right by Dr. Theda [name]. Anesthesiology has recommended at least a 9 month hiatus since the TIA to reduce acute and long-term risk perioperatively.   Review of Systems Fever, chills, sweats, or unexplained weight loss not present. No significant headaches. Mental status change or memory loss denied. Blurred vision , diplopia or vision loss absent. Vertigo, near syncope or imbalance denied. There is no numbness, tingling, or weakness in extremities.   No loss of control of bladder or bowels. Radicular type pain absent. No seizure stigmata. Chest pain, palpitations, tachycardia, exertional dyspnea, paroxysmal nocturnal dyspnea, claudication or edema are absent.    Objective:   Physical Exam Pertinent or positive findings include:There is subtle facial asymmetry. Ptosis present on the right. The left nasolabial fold is decreased. He's had a uvulectomy.  General appearance :adequately  nourished; in no distress.  Eyes: No conjunctival inflammation or scleral icterus is present.  Oral exam:  Lips and gums are healthy appearing.There is no oropharyngeal erythema or exudate noted. Dental hygiene is good.  Heart:  Normal rate and regular rhythm. S1 and S2 normal without gallop, murmur, click, rub or other extra sounds    Lungs:Chest clear to auscultation; no wheezes, rhonchi,rales ,or rubs present.No increased work of breathing.   Abdomen: bowel sounds normal, soft and non-tender without masses, organomegaly or hernias noted.  No guarding or rebound.   Vascular : all pulses equal ; no bruits present.  Skin:Warm & dry.  Intact without suspicious lesions or rashes ; no tenting or jaundice   Lymphatic: No lymphadenopathy is noted about the head, neck, axilla.   Neuro: Strength, tone & DTRs normal.     Assessment & Plan:  See Current Assessment & Plan in Problem List under specific Diagnosis

## 2015-09-30 NOTE — Assessment & Plan Note (Signed)
Continue ASA/statin  °

## 2015-09-30 NOTE — Assessment & Plan Note (Signed)
Consult Dr Sallyanne Kuster as to significance in reference to TIA in 01/2015

## 2015-09-30 NOTE — Assessment & Plan Note (Addendum)
Consult Dr Sallyanne Kuster concerning the significance ,if any ,of ECHO findings

## 2015-10-22 ENCOUNTER — Other Ambulatory Visit: Payer: Self-pay | Admitting: Internal Medicine

## 2015-10-24 NOTE — Telephone Encounter (Signed)
Left message for patient to call back to schedule appt with new pcp around may/2017---i have recd rx refill request for flonase---need to know which pcp patient wants to see---chart is showing dr burns but no future appt has been scheduled with her, need appt made before i can refill flonase---patient can talk with Tegan Britain

## 2015-11-09 ENCOUNTER — Encounter: Payer: Self-pay | Admitting: Internal Medicine

## 2015-11-09 ENCOUNTER — Ambulatory Visit (INDEPENDENT_AMBULATORY_CARE_PROVIDER_SITE_OTHER): Payer: PRIVATE HEALTH INSURANCE | Admitting: Internal Medicine

## 2015-11-09 VITALS — BP 140/88 | HR 65 | Temp 97.7°F | Resp 16 | Wt 195.0 lb

## 2015-11-09 DIAGNOSIS — E782 Mixed hyperlipidemia: Secondary | ICD-10-CM

## 2015-11-09 DIAGNOSIS — R030 Elevated blood-pressure reading, without diagnosis of hypertension: Secondary | ICD-10-CM | POA: Diagnosis not present

## 2015-11-09 DIAGNOSIS — Z23 Encounter for immunization: Secondary | ICD-10-CM | POA: Diagnosis not present

## 2015-11-09 DIAGNOSIS — Z139 Encounter for screening, unspecified: Secondary | ICD-10-CM

## 2015-11-09 DIAGNOSIS — G458 Other transient cerebral ischemic attacks and related syndromes: Secondary | ICD-10-CM | POA: Diagnosis not present

## 2015-11-09 DIAGNOSIS — K219 Gastro-esophageal reflux disease without esophagitis: Secondary | ICD-10-CM | POA: Diagnosis not present

## 2015-11-09 MED ORDER — SIMVASTATIN 40 MG PO TABS
40.0000 mg | ORAL_TABLET | Freq: Every day | ORAL | Status: DC
Start: 2015-11-09 — End: 2016-11-13

## 2015-11-09 NOTE — Assessment & Plan Note (Signed)
Controlled Continue daily med

## 2015-11-09 NOTE — Assessment & Plan Note (Signed)
Continue exercise, weight loss effects and healthy diet Start monitoring BP regularly Goal less than 130/80 -- if elevated should be on a low dose BP medication

## 2015-11-09 NOTE — Assessment & Plan Note (Signed)
Recheck lipid panel in about one month LDL goal less than 70  Consider changing to lipitor or crestor Continue exercise, weight loss effects and healthy diet

## 2015-11-09 NOTE — Addendum Note (Signed)
Addended by: Terence Lux B on: 11/09/2015 02:00 PM   Modules accepted: Orders

## 2015-11-09 NOTE — Progress Notes (Signed)
Subjective:    Patient ID: Steve [name], male    DOB: 1951-03-23, 65 y.o.   MRN: [id number]  HPI He is here to establish with a new pcp.     Hyperlipidemia: He is taking his medication daily. He is compliant with a low fat/cholesterol diet. He is exercising regularly. He has lost some weight.  He denies myalgias.   GERD:  He is taking his medication daily as prescribed.  He denies any GERD symptoms and feels his GERD is well controlled.   Elevated blood pressure:  He does not check his blood pressure.  He is exercising regularly.  He is compliant with a low sodium diet.  He denies chest pain, palps, edema and headaches.   TIA:  He had a TIA last April.  He is taking his asa, statin daily. He has an appt to see cardio next month to review his echo - possible septal aneurysm and shunt.  He has seen neuro.  No other concerning symptoms for stroke/tia.    Medications and allergies reviewed with patient and updated if appropriate.  Patient Active Problem List   Diagnosis Date Noted  . TIA (transient ischemic attack) 09/30/2015  . Abnormal echocardiogram 09/30/2015  . History of esophageal stricture 04/20/2013  . VITAMIN D DEFICIENCY 06/07/2010  . BENIGN PROSTATIC HYPERTROPHY 06/07/2010  . LIBIDO, DECREASED 06/07/2010  . FIRST DEGREE ATRIOVENTRICULAR BLOCK 02/28/2009  . History of colonic polyps 06/18/2008  . HYPERLIPIDEMIA 08/26/2007  . Abnormal involuntary movement 07/24/2007  . SKIN CANCER, HX OF 07/24/2007  . SYMPTOM, APNEA, SLEEP NOS 07/15/2007    Current Outpatient Prescriptions on File Prior to Visit  Medication Sig Dispense Refill  . aspirin 81 MG tablet Take 81 mg by mouth daily.      . fluticasone (FLONASE) 50 MCG/ACT nasal spray ONE SPRAY IN EACH NOSTRIL TWICE DAILY ASNEEDED -USE CROSSOVER TECHNIQUE DISCUSSED 16 g 0  . omeprazole (PRILOSEC) 40 MG capsule TAKE ONE CAPSULE EACH DAY 30 capsule 3  . simvastatin (ZOCOR) 40 MG tablet Take 1 tablet (40 mg total) by mouth  at bedtime. 90 tablet 3  . vardenafil (LEVITRA) 20 MG tablet Take 20 mg by mouth daily as needed.       No current facility-administered medications on file prior to visit.    Past Medical History  Diagnosis Date  . Sleep apnea   . Hyperlipidemia   . Squamous cell skin cancer     Dr.King [place]  . Benign hypertrophy of prostate     Dr.Dahlstedt  . Hx of colonic polyp 2008 & 2013    X2  . Diverticulosis 2013    Moderate   . Internal hemorrhoids 2013  . Ulcerative esophagitis 2013  . Stricture esophagus 2013     dilation X 1  . GERD (gastroesophageal reflux disease)     Past Surgical History  Procedure Laterality Date  . Nasal sinus surgery    . Uvulectomy & tonsillectomy  2008    Dr Wilburn Cornelia  . Torn biceps tendon      surgical repair  . Rotator cuff repair    . Colonoscopy w/ polypectomy  2013    tubular adenoma  . Colonoscopy w/ polypectomy  2008    hyperplastic    Social History   Social History  . Marital Status: Married    Spouse Name: N/A  . Number of Children: N/A  . Years of Education: N/A   Occupational History  . [employer]  Social History Main Topics  . Smoking status: Former Smoker -- 0.50 packs/day for 10 years    Types: Cigarettes  . Smokeless tobacco: Never Used     Comment: smoked 1972-1982, up to 1/2 ppd  . Alcohol Use: 3.0 - 3.6 oz/week    5-6 Standard drinks or equivalent per week     Comment: 3-4 drinks per week  . Drug Use: No  . Sexual Activity: Not Asked   Other Topics Concern  . None   Social History Narrative   GETS REG EXERCISE 4x weekly   NO DIET   Lives with wife in a 2 story home.  Has 2 children.     Works as a Insurance underwriter.     Education: college          Family History  Problem Relation Age of Onset  . Uterine cancer Mother   . Breast cancer Mother     Deceased, 14  . Stroke Father     > 50  . Prostate cancer Father     Deceased, 58  . Diabetes Daughter     type 1  . Stroke  Paternal Uncle     > 55  . Heart attack Maternal Grandfather      >55  . Colon cancer Neg Hx   . Rectal cancer Neg Hx   . Stomach cancer Neg Hx   . Hyperlipidemia Son   . Healthy Brother   . Heart attack Maternal Grandfather     mid-60s    Review of Systems  Constitutional: Negative for fever and chills.  Respiratory: Negative for cough, shortness of breath and wheezing.   Cardiovascular: Negative for chest pain, palpitations and leg swelling.  Gastrointestinal:       GERD controlled, only rare GERD  Neurological: Negative for dizziness, weakness, light-headedness, numbness and headaches.       Objective:   Filed Vitals:   11/09/15 1305  BP: 140/88  Pulse: 65  Temp: 97.7 F (36.5 C)  Resp: 16   Filed Weights   11/09/15 1305  Weight: 195 lb (88.451 kg)   Body mass index is 25.03 kg/(m^2).   Physical Exam Constitutional: Appears well-developed and well-nourished. No distress.  Neck: Neck supple. No tracheal deviation present. No thyromegaly present.  No carotid bruit. No cervical adenopathy.   Cardiovascular: Normal rate, regular rhythm and normal heart sounds.   No murmur heard.  No edema Pulmonary/Chest: Effort normal and breath sounds normal. No respiratory distress. No wheezes.      Assessment & Plan:    See Problem List for Assessment and Plan of chronic medical problems.

## 2015-11-09 NOTE — Patient Instructions (Signed)
  Fasting blood work was ordered - ideally get it done just before your appointment with cardiology.  All other Health Maintenance issues reviewed.   All recommended immunizations and age-appropriate screenings are up-to-date.  prevnar vaccine administered today.   Medications reviewed and updated.  No changes recommended at this time.  Start monitoring your blood pressure regularly - goal is 130/80 or less.  If it is elevated I would recommend adding a low dose blood pressure medication.  LDL (bad) cholesterol goal is < 70.  Work on lifestyle and repeat blood work in about one month.  We should consider changing your medication to a stronger medication.    Your prescription(s) have been submitted to your pharmacy. Please take as directed and contact our office if you believe you are having problem(s) with the medication(s).  Please schedule followup in 6-8 weeks

## 2015-11-09 NOTE — Progress Notes (Signed)
Pre visit review using our clinic review tool, if applicable. No additional management support is needed unless otherwise documented below in the visit note. 

## 2015-11-09 NOTE — Assessment & Plan Note (Signed)
Will see cardio in one month to review echo Continue asa Continue health diet and regular exercise Working on weight loss Check BP regularly - if more than 130/80 consistently would recommend a  Low dose BP med Will recheck cholesterol - goal of LDL less than 70 -- recommend changing to lipitor or crestor - will discuss after lipid panel

## 2015-11-09 NOTE — Addendum Note (Signed)
Addended by: Binnie Rail on: 11/09/2015 02:59 PM   Modules accepted: Orders

## 2015-11-14 ENCOUNTER — Ambulatory Visit: Payer: 59 | Admitting: Cardiovascular Disease

## 2015-12-02 ENCOUNTER — Other Ambulatory Visit: Payer: Self-pay | Admitting: Internal Medicine

## 2015-12-07 ENCOUNTER — Encounter: Payer: Self-pay | Admitting: Cardiovascular Disease

## 2015-12-07 ENCOUNTER — Ambulatory Visit (INDEPENDENT_AMBULATORY_CARE_PROVIDER_SITE_OTHER): Payer: PRIVATE HEALTH INSURANCE | Admitting: Cardiovascular Disease

## 2015-12-07 VITALS — BP 142/72 | HR 60 | Ht 75.0 in | Wt 192.6 lb

## 2015-12-07 DIAGNOSIS — Q2112 Patent foramen ovale: Secondary | ICD-10-CM

## 2015-12-07 DIAGNOSIS — Z8673 Personal history of transient ischemic attack (TIA), and cerebral infarction without residual deficits: Secondary | ICD-10-CM

## 2015-12-07 DIAGNOSIS — E7800 Pure hypercholesterolemia, unspecified: Secondary | ICD-10-CM | POA: Diagnosis not present

## 2015-12-07 DIAGNOSIS — Q211 Atrial septal defect: Secondary | ICD-10-CM

## 2015-12-07 DIAGNOSIS — G459 Transient cerebral ischemic attack, unspecified: Secondary | ICD-10-CM | POA: Diagnosis not present

## 2015-12-07 DIAGNOSIS — I440 Atrioventricular block, first degree: Secondary | ICD-10-CM

## 2015-12-07 DIAGNOSIS — I253 Aneurysm of heart: Secondary | ICD-10-CM

## 2015-12-07 NOTE — Patient Instructions (Signed)
Medication Instructions:  Your physician recommends that you continue on your current medications as directed. Please refer to the Current Medication list given to you today.   Labwork: none  Testing/Procedures: Dr. Sallyanne Kuster would like you to be scheduled for a Loop Recorder. The procedure will be done at [facility] at your convenience.   Follow-Up: Your physician wants you to follow-up in: 12 months with Dr. Sallyanne Kuster. You will receive a reminder letter in the mail two months in advance. If you don't receive a letter, please call our office to schedule the follow-up appointment.   Any Other Special Instructions Will Be Listed Below (If Applicable).     If you need a refill on your cardiac medications before your next appointment, please call your pharmacy.

## 2015-12-08 ENCOUNTER — Other Ambulatory Visit: Payer: Self-pay | Admitting: *Deleted

## 2015-12-08 DIAGNOSIS — I253 Aneurysm of heart: Secondary | ICD-10-CM | POA: Insufficient documentation

## 2015-12-08 DIAGNOSIS — Z8673 Personal history of transient ischemic attack (TIA), and cerebral infarction without residual deficits: Secondary | ICD-10-CM | POA: Insufficient documentation

## 2015-12-08 DIAGNOSIS — Q211 Atrial septal defect: Secondary | ICD-10-CM | POA: Insufficient documentation

## 2015-12-08 DIAGNOSIS — Q2112 Patent foramen ovale: Secondary | ICD-10-CM | POA: Insufficient documentation

## 2015-12-08 NOTE — Progress Notes (Signed)
Patient ID: Steve [name], male   DOB: 26-Mar-1951, 65 y.o.   MRN: [id number]     Cardiology Office Note    Date:  12/08/2015   ID:  Steve [name], DOB Feb 05, 1951, MRN [id number]  PCP:  Binnie Rail, MD  Cardiologist:   Sanda Klein, MD   Chief Complaint  Patient presents with  . New Patient (Initial Visit)    no chest pain, no shortness of breath, no edema, no fatigue,no pain or cramping in legs, no lightheadedness or dizziness    History of Present Illness:  Steve [name] is a 65 y.o. male who presents to discuss further cardiac evaluation after an ischemic neurological event that occurred on 01/29/2015.  At that time he experienced relatively sudden onset of numbness in his right face and arm and clumsiness in his fingers. The sensation abnormalities resolved within a matter of 5 or 6 hours but the finding motor deficit resolved much more gradually over about a week. MRI showed evidence of a subacute infarct in the left posterior centrum semiovale. MRA of the neck showed mild to moderate bilateral carotid atherosclerosis without significant obstruction. Arrhythmia monitor showed only PVCs, but this was limited to a 48 hour Holter. His transthoracic echocardiogram showed evidence of a possible atrial septal aneurysm and moderate right to left shunt across the atrial septum with saline contrast injection. Aspirin was recommended and he has not had any neurological events since then. He has a history of right hemi-facial spasm which is treated with occasional botulinum toxin injections  Steve [name] is very fit and very active. He exercises at the gym 3 days a week and also goes to lock his classes. Another day week he does TR X exercises. He goes to routine yoga another day week and then he plays golf on the weekend. He is lean and feels healthy. During activity he denies any problems with dyspnea or angina and he has not expressed dizziness, syncope and never complains of  palpitations. He denies leg edema. His erectile dysfunction is treated with PDE 5 inhibitors.  He takes a statin and aspirin 81 mg daily. Blood pressure at home is routinely around 130-135/60. It is marginally elevated in the office today  Past Medical History  Diagnosis Date  . Sleep apnea   . Hyperlipidemia   . Squamous cell skin cancer     Dr.King [place]  . Benign hypertrophy of prostate     Dr.Dahlstedt  . Hx of colonic polyp 2008 & 2013    X2  . Diverticulosis 2013    Moderate   . Internal hemorrhoids 2013  . Ulcerative esophagitis 2013  . Stricture esophagus 2013     dilation X 1  . GERD (gastroesophageal reflux disease)     Past Surgical History  Procedure Laterality Date  . Nasal sinus surgery    . Uvulectomy & tonsillectomy  2008    Dr Wilburn Cornelia  . Torn biceps tendon      surgical repair  . Rotator cuff repair    . Colonoscopy w/ polypectomy  2013    tubular adenoma  . Colonoscopy w/ polypectomy  2008    hyperplastic    Outpatient Prescriptions Prior to Visit  Medication Sig Dispense Refill  . aspirin 81 MG tablet Take 81 mg by mouth daily.      . fluticasone (FLONASE) 50 MCG/ACT nasal spray ONE SPRAY IN EACH NOSTRIL TWICE DAILY ASNEEDED -USE CROSSOVER TECHNIQUE DISCUSSED 16 g 2  . omeprazole (PRILOSEC) 40 MG  capsule TAKE ONE CAPSULE EACH DAY 30 capsule 3  . simvastatin (ZOCOR) 40 MG tablet Take 1 tablet (40 mg total) by mouth at bedtime. 90 tablet 3  . vardenafil (LEVITRA) 20 MG tablet Take 20 mg by mouth daily as needed.       No facility-administered medications prior to visit.     Allergies:   Review of patient's allergies indicates no known allergies.   Social History   Social History  . Marital Status: Married    Spouse Name: N/A  . Number of Children: N/A  . Years of Education: N/A   Occupational History  . PRESIDENT [employer]    Social History Main Topics  . Smoking status: Former Smoker -- 0.50 packs/day for 10 years     Types: Cigarettes  . Smokeless tobacco: Never Used     Comment: smoked 1972-1982, up to 1/2 ppd  . Alcohol Use: 3.0 - 3.6 oz/week    5-6 Standard drinks or equivalent per week     Comment: 3-4 drinks per week  . Drug Use: No  . Sexual Activity: Not Asked   Other Topics Concern  . None   Social History Narrative   GETS REG EXERCISE 4x weekly   NO DIET   Lives with wife in a 2 story home.  Has 2 children.     Works as a Insurance underwriter.     Education: college           Family History:  The patient's family history includes Breast cancer in his mother; Diabetes in his daughter; Healthy in his brother; Heart attack in his maternal grandfather and maternal grandfather; Hyperlipidemia in his son; Prostate cancer in his father; Stroke in his father and paternal uncle; Uterine cancer in his mother. There is no history of Colon cancer, Rectal cancer, or Stomach cancer.   ROS:   Please see the history of present illness.    ROS All other systems reviewed and are negative.   PHYSICAL EXAM:   VS:  BP 142/72 mmHg  Pulse 60  Ht 6\' 3"  (1.905 m)  Wt 87.346 kg (192 lb 9 oz)  BMI 24.07 kg/m2   GEN: Well nourished, well developed, in no acute distress HEENT: normal Neck: no JVD, carotid bruits, or masses Cardiac: RRR; no murmurs, rubs, or gallops,no edema  Respiratory:  clear to auscultation bilaterally, normal work of breathing GI: soft, nontender, nondistended, + BS MS: no deformity or atrophy Skin: warm and dry, no rash Neuro:  Alert and Oriented x 3, Strength and sensation are intact he has mild facial asymmetry due to some increased tonus of the muscles on the right side of his face but has normal left facial muscle strength. Psych: euthymic mood, full affect  Wt Readings from Last 3 Encounters:  12/07/15 87.346 kg (192 lb 9 oz)  11/09/15 88.451 kg (195 lb)  09/30/15 92.534 kg (204 lb)      Studies/Labs Reviewed:   EKG:  EKG is ordered today.  The ekg ordered today  demonstrates honest rhythm with first-degree AV block (PR interval 222 ms), otherwise normal  Recent Labs: 02/07/2015: ALT 35; BUN 12; Creatinine, Ser 1.06; Hemoglobin 16.0; Platelets 175.0; Potassium 3.9; Sodium 137; TSH 1.62   Lipid Panel    Component Value Date/Time   CHOL 183 02/07/2015 1535   CHOL 165 04/27/2013 0931   TRIG 81 02/07/2015 1535   TRIG 45.0 04/27/2013 0931   HDL 63 02/07/2015 1535   HDL 58.50 04/27/2013  0931   CHOLHDL 3 04/27/2013 0931   VLDL 9.0 04/27/2013 0931   LDLCALC 104* 02/07/2015 1535   LDLCALC 98 04/27/2013 0931   LDLDIRECT 172.0 07/24/2007 0000    Additional studies/ records that were reviewed today include:  Echo images, MRI/MRA images, records of his hospital evaluation last year    ASSESSMENT:    1. History of stroke   2. 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 First degree atrioventricular block   4. Hypercholesterolemia      PLAN:  In order of problems listed above:  1. Stroke: Think it is clear that he actually had a small stroke, although he has recovered without any neurological sequelae. He does not have significant carotid stenosis although he does have some atherosclerotic lesions. His age raises the possibility of atrial fibrillation and I think he needs more than just a 48 hour Holter monitor to exclude this. I have recommended that he have an implantable loop recorder for unexplained stroke. Stressed the loop recorder in detail, pros/cons, possible complications. He understands and agrees to proceed 2. PFO: We discussed the fact that patent foramen ovale is a very common finding in healthy individuals. However the presence of a moderate right to left unprovoked shunt and the presence of an interatrial septal aneurysm both increase the likelihood that this could be a pathway for paradoxical embolism. Statistically however this would be a much less common reason for stroke then atrial fibrillation. I think if he is to  experience a second neurological event and we still have not identified atrial arrhythmia, he should undergo transesophageal echocardiogram and strong consideration can then be given to closure of the defect if it is confirmed 3. 1st deg AVB: His minor AV conduction abnormality is not likely to be of clinical relevance at this time. 4. HLP: Agree that he should continue statin therapy. In the past, while not on statins his LDL was high. Target LDL less than 100.    Medication Adjustments/Labs and Tests Ordered: Current medicines are reviewed at length with the patient today.  Concerns regarding medicines are outlined above.  Medication changes, Labs and Tests ordered today are listed in the Patient Instructions below. Patient Instructions  Medication Instructions:  Your physician recommends that you continue on your current medications as directed. Please refer to the Current Medication list given to you today.   Labwork: none  Testing/Procedures: Dr. Sallyanne Kuster would like you to be scheduled for a Loop Recorder. The procedure will be done at [facility] at your convenience.   Follow-Up: Your physician wants you to follow-up in: 12 months with Dr. Sallyanne Kuster. You will receive a reminder letter in the mail two months in advance. If you don't receive a letter, please call our office to schedule the follow-up appointment.   Any Other Special Instructions Will Be Listed Below (If Applicable).     If you need a refill on your cardiac medications before your next appointment, please call your pharmacy.        Mikael Spray, MD  12/08/2015 6:36 PM    [facility] [place], [place], [place]  [address] Phone: [phone]; Fax: [phone]

## 2015-12-13 ENCOUNTER — Telehealth: Payer: Self-pay | Admitting: Cardiovascular Disease

## 2015-12-13 NOTE — Telephone Encounter (Signed)
Spoke with lab, the pt does not need lab or cxr prior to loop recorder insertion.

## 2015-12-19 ENCOUNTER — Telehealth: Payer: Self-pay | Admitting: Cardiovascular Disease

## 2015-12-19 NOTE — Telephone Encounter (Signed)
Called and left a voicemail for the patient as to reschedule of his loop recorder due to doctor out on jury duty.

## 2015-12-20 ENCOUNTER — Telehealth: Payer: Self-pay | Admitting: Cardiovascular Disease

## 2015-12-20 NOTE — Telephone Encounter (Signed)
New Message  Pt request a call back to discuss if labs are needed for Cath.

## 2015-12-20 NOTE — Telephone Encounter (Signed)
Patient aware.

## 2015-12-20 NOTE — Telephone Encounter (Signed)
Returned call to patient and confirmed with him that he does not need lab work prior to loop recorder insertion. The confusion came when the scheduler contacted him to reschedule the procedure, who told him to have blood work around 3/21.   Patient states that Dr. Loletha Grayer told him when he was last in the office he can drive himself to and from the hospital for the procedure - informed patient this is not explicitly stated in the MD note.   Routed to MD to confirm this?

## 2015-12-20 NOTE — Telephone Encounter (Signed)
Yes, he can drive away. No sedation planned.

## 2015-12-26 ENCOUNTER — Other Ambulatory Visit: Payer: Self-pay | Admitting: Internal Medicine

## 2015-12-27 ENCOUNTER — Ambulatory Visit: Payer: PRIVATE HEALTH INSURANCE | Admitting: Internal Medicine

## 2015-12-28 ENCOUNTER — Other Ambulatory Visit: Payer: Self-pay

## 2015-12-28 MED ORDER — OMEPRAZOLE 40 MG PO CPDR: 40.0000 mg | DELAYED_RELEASE_CAPSULE | Freq: Every day | ORAL | Status: DC

## 2016-01-02 ENCOUNTER — Telehealth: Payer: Self-pay | Admitting: Cardiovascular Disease

## 2016-01-02 NOTE — Telephone Encounter (Signed)
Message routed to the pre-cert pool.

## 2016-01-02 NOTE — Telephone Encounter (Signed)
°  New Prob   Pt is coming into hospital for loop recorder insertion. However, pre authorization is requested. Please call.

## 2016-01-03 ENCOUNTER — Other Ambulatory Visit: Payer: Self-pay

## 2016-01-03 ENCOUNTER — Encounter (HOSPITAL_COMMUNITY): Payer: Self-pay | Admitting: Cardiovascular Disease

## 2016-01-03 ENCOUNTER — Encounter (HOSPITAL_COMMUNITY)
Admission: RE | Disposition: A | Discharge status: Home / Self Care | Payer: Self-pay | Source: Ambulatory Visit | Attending: Cardiology

## 2016-01-03 ENCOUNTER — Ambulatory Visit (HOSPITAL_COMMUNITY)
Admission: RE | Admit: 2016-01-03 | Discharge: 2016-01-03 | Disposition: A | Discharge status: Home / Self Care | Payer: 59 | Source: Ambulatory Visit | Attending: Cardiology | Admitting: Cardiology

## 2016-01-03 DIAGNOSIS — N400 Enlarged prostate without lower urinary tract symptoms: Secondary | ICD-10-CM | POA: Diagnosis not present

## 2016-01-03 DIAGNOSIS — K219 Gastro-esophageal reflux disease without esophagitis: Secondary | ICD-10-CM | POA: Diagnosis not present

## 2016-01-03 DIAGNOSIS — Z85828 Personal history of other malignant neoplasm of skin: Secondary | ICD-10-CM | POA: Diagnosis not present

## 2016-01-03 DIAGNOSIS — G458 Other transient cerebral ischemic attacks and related syndromes: Secondary | ICD-10-CM

## 2016-01-03 DIAGNOSIS — G4730 Sleep apnea, unspecified: Secondary | ICD-10-CM | POA: Insufficient documentation

## 2016-01-03 DIAGNOSIS — Q211 Atrial septal defect: Secondary | ICD-10-CM | POA: Insufficient documentation

## 2016-01-03 DIAGNOSIS — E7800 Pure hypercholesterolemia, unspecified: Secondary | ICD-10-CM | POA: Diagnosis not present

## 2016-01-03 DIAGNOSIS — I638 Other cerebral infarction: Secondary | ICD-10-CM | POA: Insufficient documentation

## 2016-01-03 DIAGNOSIS — Z8249 Family history of ischemic heart disease and other diseases of the circulatory system: Secondary | ICD-10-CM | POA: Insufficient documentation

## 2016-01-03 DIAGNOSIS — I440 Atrioventricular block, first degree: Secondary | ICD-10-CM | POA: Insufficient documentation

## 2016-01-03 DIAGNOSIS — Z87891 Personal history of nicotine dependence: Secondary | ICD-10-CM | POA: Diagnosis not present

## 2016-01-03 DIAGNOSIS — I6523 Occlusion and stenosis of bilateral carotid arteries: Secondary | ICD-10-CM | POA: Insufficient documentation

## 2016-01-03 DIAGNOSIS — Z7982 Long term (current) use of aspirin: Secondary | ICD-10-CM | POA: Insufficient documentation

## 2016-01-03 DIAGNOSIS — Z8673 Personal history of transient ischemic attack (TIA), and cerebral infarction without residual deficits: Secondary | ICD-10-CM | POA: Diagnosis present

## 2016-01-03 HISTORY — PX: EP IMPLANTABLE DEVICE: SHX172B

## 2016-01-03 SURGERY — LOOP RECORDER INSERTION

## 2016-01-03 MED ORDER — LIDOCAINE-EPINEPHRINE 1 %-1:100000 IJ SOLN
INTRAMUSCULAR | Status: DC | PRN
  Administered 2016-01-03: 20 mL

## 2016-01-03 MED ORDER — LIDOCAINE-EPINEPHRINE 1 %-1:100000 IJ SOLN
INTRAMUSCULAR | Status: AC
  Filled 2016-01-03: qty 1

## 2016-01-03 SURGICAL SUPPLY — 2 items
LOOP REVEAL LINQSYS (Prosthesis & Implant Heart) ×1 IMPLANT
PACK LOOP INSERTION (CUSTOM PROCEDURE TRAY) ×2 IMPLANT

## 2016-01-03 NOTE — Discharge Instructions (Signed)
Loop insertion, Care After Refer to this sheet in the next few weeks. These instructions provide you with information on caring for yourself after your procedure. Your health care provider may also give you more specific instructions. Your treatment has been planned according to current medical practices, but problems sometimes occur. Call your health care provider if you have any problems or questions after your procedure. WHAT TO EXPECT AFTER THE PROCEDURE After your procedure, it is typical to have the following sensations:  Soreness at the site. HOME CARE INSTRUCTIONS   Keep the incision clean and dry.  Unless advised otherwise, you may shower beginning 48 hours after your procedure.  For the first week after the replacement, avoid stretching motions that pull at the incision site, and avoid heavy exercise with the arm that is on the same side as the incision.  Take medicines only as directed by your health care provider.  Keep all follow-up visits as directed by your health care provider. SEEK MEDICAL CARE IF:   You have pain at the incision site that is not relieved by over-the-counter or prescription medicine.  There is drainage or pus from the incision site.  There is swelling larger than a lime at the incision site.  You develop red streaking that extends above or below the incision site.  You feel brief, intermittent palpitations, light-headedness, or any symptoms that you feel might be related to your heart. SEEK IMMEDIATE MEDICAL CARE IF:   You experience chest pain that is different than the pain at the site.  You experience shortness of breath.  You have palpitations or irregular heartbeat.  You have light-headedness that does not go away quickly.  You faint.  You have pain that gets worse and is not relieved by medicine.   This information is not intended to replace advice given to you by your health care provider. Make sure you discuss any questions you have  with your health care provider.   Document Released: 07/15/2013 Document Revised: 10/15/2014 Document Reviewed: 07/15/2013 Elsevier Interactive Patient Education [employer].

## 2016-01-03 NOTE — Interval H&P Note (Signed)
History and Physical Interval Note:  01/03/2016 2:05 PM  Steve [name]  has presented today for surgery, with the diagnosis of tci  The various methods of treatment have been discussed with the patient and family. After consideration of risks, benefits and other options for treatment, the patient has consented to  Procedure(s): Loop Recorder Insertion (N/A) as a surgical intervention .  The patient's history has been reviewed, patient examined, no change in status, stable for surgery.  I have reviewed the patient's chart and labs.  Questions were answered to the patient's satisfaction.     Rhonda Linan

## 2016-01-03 NOTE — Op Note (Signed)
LOOP RECORDER IMPLANT   Procedure report  Procedure performed:  Loop recorder implantation   Reason for procedure:  1. Cryptogenic stroke Procedure performed by:  Sanda Klein, MD  Complications:  None  Estimated blood loss:  <5 mL  Medications administered during procedure:  Lidocaine 1% with 1/10,000 epinephrine 10 mL locally Device details:  Medtronic Reveal Linq model number [id number], serial number [id number] Procedure details:  After the risks and benefits of the procedure were discussed the patient provided informed consent. The patient was prepped and draped in usual sterile fashion. Local anesthesia was administered to an area 2 cm to the left of the sternum in the 4th intercostal space. A cutaneous incision was made using the incision tool. The introducer was then used to create a subcutaneous tunnel and carefully deploy the device. Local pressure was held to ensure hemostasis.  The incision was closed with SteriStrips and a sterile dressing was applied.  R waves 0.63 mV.  Sanda Klein, MD, [place] [phone] office [phone] pager 01/03/2016 2:07 PM

## 2016-01-03 NOTE — H&P (View-Only) (Signed)
Patient ID: Steve [name], male   DOB: [birth year], 65 y.o.   MRN: [id number]     Cardiology Office Note    Date:  12/08/2015   ID:  Steve [name], DOB 1950-12-20, MRN [id number]  PCP:  Binnie Rail, MD  Cardiologist:   Sanda Klein, MD   Chief Complaint  Patient presents with  . New Patient (Initial Visit)    no chest pain, no shortness of breath, no edema, no fatigue,no pain or cramping in legs, no lightheadedness or dizziness    History of Present Illness:  Steve [name] is a 65 y.o. male who presents to discuss further cardiac evaluation after an ischemic neurological event that occurred on 01/29/2015.  At that time he experienced relatively sudden onset of numbness in his right face and arm and clumsiness in his fingers. The sensation abnormalities resolved within a matter of 5 or 6 hours but the finding motor deficit resolved much more gradually over about a week. MRI showed evidence of a subacute infarct in the left posterior centrum semiovale. MRA of the neck showed mild to moderate bilateral carotid atherosclerosis without significant obstruction. Arrhythmia monitor showed only PVCs, but this was limited to a 48 hour Holter. His transthoracic echocardiogram showed evidence of a possible atrial septal aneurysm and moderate right to left shunt across the atrial septum with saline contrast injection. Aspirin was recommended and he has not had any neurological events since then. He has a history of right hemi-facial spasm which is treated with occasional botulinum toxin injections  [name] is very fit and very active. He exercises at the gym 3 days a week and also goes to lock his classes. Another day week he does TR X exercises. He goes to routine yoga another day week and then he plays golf on the weekend. He is lean and feels healthy. During activity he denies any problems with dyspnea or angina and he has not expressed dizziness, syncope and never complains of  palpitations. He denies leg edema. His erectile dysfunction is treated with PDE 5 inhibitors.  He takes a statin and aspirin 81 mg daily. Blood pressure at home is routinely around 130-135/60. It is marginally elevated in the office today  Past Medical History  Diagnosis Date  . Sleep apnea   . Hyperlipidemia   . Squamous cell skin cancer     Dr.King [place]  . Benign hypertrophy of prostate     Dr.Dahlstedt  . Hx of colonic polyp 2008 & 2013    X2  . Diverticulosis 2013    Moderate   . Internal hemorrhoids 2013  . Ulcerative esophagitis 2013  . Stricture esophagus 2013     dilation X 1  . GERD (gastroesophageal reflux disease)     Past Surgical History  Procedure Laterality Date  . Nasal sinus surgery    . Uvulectomy & tonsillectomy  2008    Dr Wilburn Cornelia  . Torn biceps tendon      surgical repair  . Rotator cuff repair    . Colonoscopy w/ polypectomy  2013    tubular adenoma  . Colonoscopy w/ polypectomy  2008    hyperplastic    Outpatient Prescriptions Prior to Visit  Medication Sig Dispense Refill  . aspirin 81 MG tablet Take 81 mg by mouth daily.      . fluticasone (FLONASE) 50 MCG/ACT nasal spray ONE SPRAY IN EACH NOSTRIL TWICE DAILY ASNEEDED -USE CROSSOVER TECHNIQUE DISCUSSED 16 g 2  . omeprazole (PRILOSEC) 40 MG  capsule TAKE ONE CAPSULE EACH DAY 30 capsule 3  . simvastatin (ZOCOR) 40 MG tablet Take 1 tablet (40 mg total) by mouth at bedtime. 90 tablet 3  . vardenafil (LEVITRA) 20 MG tablet Take 20 mg by mouth daily as needed.       No facility-administered medications prior to visit.     Allergies:   Review of patient's allergies indicates no known allergies.   Social History   Social History  . Marital Status: Married    Spouse Name: N/A  . Number of Children: N/A  . Years of Education: N/A   Occupational History  . [employer]    Social History Main Topics  . Smoking status: Former Smoker -- 0.50 packs/day for 10 years     Types: Cigarettes  . Smokeless tobacco: Never Used     Comment: smoked 1972-1982, up to 1/2 ppd  . Alcohol Use: 3.0 - 3.6 oz/week    5-6 Standard drinks or equivalent per week     Comment: 3-4 drinks per week  . Drug Use: No  . Sexual Activity: Not Asked   Other Topics Concern  . None   Social History Narrative   GETS REG EXERCISE 4x weekly   NO DIET   Lives with wife in a 2 story home.  Has 2 children.     Works as a Insurance underwriter.     Education: college           Family History:  The patient's family history includes Breast cancer in his mother; Diabetes in his daughter; Healthy in his brother; Heart attack in his maternal grandfather and maternal grandfather; Hyperlipidemia in his son; Prostate cancer in his father; Stroke in his father and paternal uncle; Uterine cancer in his mother. There is no history of Colon cancer, Rectal cancer, or Stomach cancer.   ROS:   Please see the history of present illness.    ROS All other systems reviewed and are negative.   PHYSICAL EXAM:   VS:  BP 142/72 mmHg  Pulse 60  Ht 6\' 3"  (1.905 m)  Wt 87.346 kg (192 lb 9 oz)  BMI 24.07 kg/m2   GEN: Well nourished, well developed, in no acute distress HEENT: normal Neck: no JVD, carotid bruits, or masses Cardiac: RRR; no murmurs, rubs, or gallops,no edema  Respiratory:  clear to auscultation bilaterally, normal work of breathing GI: soft, nontender, nondistended, + BS MS: no deformity or atrophy Skin: warm and dry, no rash Neuro:  Alert and Oriented x 3, Strength and sensation are intact he has mild facial asymmetry due to some increased tonus of the muscles on the right side of his face but has normal left facial muscle strength. Psych: euthymic mood, full affect  Wt Readings from Last 3 Encounters:  12/07/15 87.346 kg (192 lb 9 oz)  11/09/15 88.451 kg (195 lb)  09/30/15 92.534 kg (204 lb)      Studies/Labs Reviewed:   EKG:  EKG is ordered today.  The ekg ordered today  demonstrates honest rhythm with first-degree AV block (PR interval 222 ms), otherwise normal  Recent Labs: 02/07/2015: ALT 35; BUN 12; Creatinine, Ser 1.06; Hemoglobin 16.0; Platelets 175.0; Potassium 3.9; Sodium 137; TSH 1.62   Lipid Panel    Component Value Date/Time   CHOL 183 02/07/2015 1535   CHOL 165 04/27/2013 0931   TRIG 81 02/07/2015 1535   TRIG 45.0 04/27/2013 0931   HDL 63 02/07/2015 1535   HDL 58.50 04/27/2013  0931   CHOLHDL 3 04/27/2013 0931   VLDL 9.0 04/27/2013 0931   LDLCALC 104* 02/07/2015 1535   LDLCALC 98 04/27/2013 0931   LDLDIRECT 172.0 07/24/2007 0000    Additional studies/ records that were reviewed today include:  Echo images, MRI/MRA images, records of his hospital evaluation last year    ASSESSMENT:    1. History of stroke   2. 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 First degree atrioventricular block   4. Hypercholesterolemia      PLAN:  In order of problems listed above:  1. Stroke: Think it is clear that he actually had a small stroke, although he has recovered without any neurological sequelae. He does not have significant carotid stenosis although he does have some atherosclerotic lesions. His age raises the possibility of atrial fibrillation and I think he needs more than just a 48 hour Holter monitor to exclude this. I have recommended that he have an implantable loop recorder for unexplained stroke. Stressed the loop recorder in detail, pros/cons, possible complications. He understands and agrees to proceed 2. PFO: We discussed the fact that patent foramen ovale is a very common finding in healthy individuals. However the presence of a moderate right to left unprovoked shunt and the presence of an interatrial septal aneurysm both increase the likelihood that this could be a pathway for paradoxical embolism. Statistically however this would be a much less common reason for stroke then atrial fibrillation. I think if he is to  experience a second neurological event and we still have not identified atrial arrhythmia, he should undergo transesophageal echocardiogram and strong consideration can then be given to closure of the defect if it is confirmed 3. 1st deg AVB: His minor AV conduction abnormality is not likely to be of clinical relevance at this time. 4. HLP: Agree that he should continue statin therapy. In the past, while not on statins his LDL was high. Target LDL less than 100.    Medication Adjustments/Labs and Tests Ordered: Current medicines are reviewed at length with the patient today.  Concerns regarding medicines are outlined above.  Medication changes, Labs and Tests ordered today are listed in the Patient Instructions below. Patient Instructions  Medication Instructions:  Your physician recommends that you continue on your current medications as directed. Please refer to the Current Medication list given to you today.   Labwork: none  Testing/Procedures: Dr. Sallyanne Kuster would like you to be scheduled for a Loop Recorder. The procedure will be done at [facility] at your convenience.   Follow-Up: Your physician wants you to follow-up in: 12 months with Dr. Sallyanne Kuster. You will receive a reminder letter in the mail two months in advance. If you don't receive a letter, please call our office to schedule the follow-up appointment.   Any Other Special Instructions Will Be Listed Below (If Applicable).     If you need a refill on your cardiac medications before your next appointment, please call your pharmacy.        Mikael Spray, MD  12/08/2015 6:36 PM    [facility] [address], [place], [place]  [address] Phone: [phone]; Fax: [phone]

## 2016-01-04 ENCOUNTER — Other Ambulatory Visit (INDEPENDENT_AMBULATORY_CARE_PROVIDER_SITE_OTHER): Payer: PRIVATE HEALTH INSURANCE

## 2016-01-04 ENCOUNTER — Ambulatory Visit (INDEPENDENT_AMBULATORY_CARE_PROVIDER_SITE_OTHER): Payer: PRIVATE HEALTH INSURANCE | Admitting: Internal Medicine

## 2016-01-04 ENCOUNTER — Encounter: Payer: Self-pay | Admitting: Internal Medicine

## 2016-01-04 VITALS — BP 130/86 | HR 54 | Temp 97.8°F | Resp 16 | Wt 190.0 lb

## 2016-01-04 DIAGNOSIS — G458 Other transient cerebral ischemic attacks and related syndromes: Secondary | ICD-10-CM | POA: Diagnosis not present

## 2016-01-04 DIAGNOSIS — E782 Mixed hyperlipidemia: Secondary | ICD-10-CM | POA: Diagnosis not present

## 2016-01-04 DIAGNOSIS — Z139 Encounter for screening, unspecified: Secondary | ICD-10-CM

## 2016-01-04 LAB — COMPREHENSIVE METABOLIC PANEL
ALT: 19 U/L (ref 0–53)
AST: 20 U/L (ref 0–37)
Albumin: 4.4 g/dL (ref 3.5–5.2)
Alkaline Phosphatase: 52 U/L (ref 39–117)
BUN: 16 mg/dL (ref 6–23)
CO2: 30 mEq/L (ref 19–32)
Calcium: 9.2 mg/dL (ref 8.4–10.5)
Chloride: 103 mEq/L (ref 96–112)
Creatinine, Ser: 1.11 mg/dL (ref 0.40–1.50)
GFR: 70.45 mL/min (ref >60.00)
Glucose, Bld: 102 mg/dL — ABNORMAL HIGH (ref 70–99)
Potassium: 4.5 mEq/L (ref 3.5–5.1)
Sodium: 138 mEq/L (ref 135–145)
Total Bilirubin: 0.7 mg/dL (ref 0.2–1.2)
Total Protein: 7.2 g/dL (ref 6.0–8.3)

## 2016-01-04 LAB — LIPID PANEL
Cholesterol: 159 mg/dL (ref 0–200)
HDL: 63.5 mg/dL (ref >39.00)
LDL Cholesterol: 83 mg/dL (ref 0–99)
NonHDL: 95.3
Total CHOL/HDL Ratio: 3
Triglycerides: 61 mg/dL (ref 0.0–149.0)
VLDL: 12.2 mg/dL (ref 0.0–40.0)

## 2016-01-04 LAB — HEPATITIS C ANTIBODY: HCV Ab: NEGATIVE

## 2016-01-04 NOTE — Progress Notes (Signed)
Subjective:    Patient ID: Steve [name], male    DOB: [birth year], 65 y.o.   MRN: [id number]  HPI He is here for routine follow-up. He has no concerns or questions.  Hyperlipidemia: He is taking his medication daily. He is compliant with a low fat/cholesterol diet. He is exercising regularly. He denies myalgias.   History of TIA/stroke: He did see cardiology since he was here last. He had a loop recorder implanted yesterday.  He is taking his aspirin daily. He is also taking the simvastatin daily as prescribed. He is exercising regularly.  Medications and allergies reviewed with patient and updated if appropriate.  Patient Active Problem List   Diagnosis Date Noted  . 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 History of stroke 12/08/2015  . GERD (gastroesophageal reflux disease) 11/09/2015  . Elevated blood pressure (not hypertension) 11/09/2015  . TIA (transient ischemic attack) 09/30/2015  . Abnormal echocardiogram 09/30/2015  . History of esophageal stricture 04/20/2013  . VITAMIN D DEFICIENCY 06/07/2010  . BENIGN PROSTATIC HYPERTROPHY 06/07/2010  . LIBIDO, DECREASED 06/07/2010  . FIRST DEGREE ATRIOVENTRICULAR BLOCK 02/28/2009  . History of colonic polyps 06/18/2008  . HYPERLIPIDEMIA 08/26/2007  . Abnormal involuntary movement 07/24/2007  . SKIN CANCER, HX OF 07/24/2007  . SYMPTOM, APNEA, SLEEP NOS 07/15/2007    Current Outpatient Prescriptions on File Prior to Visit  Medication Sig Dispense Refill  . aspirin 81 MG tablet Take 81 mg by mouth daily.      . fluticasone (FLONASE) 50 MCG/ACT nasal spray ONE SPRAY IN EACH NOSTRIL TWICE DAILY ASNEEDED -USE CROSSOVER TECHNIQUE DISCUSSED 16 g 2  . omeprazole (PRILOSEC) 40 MG capsule Take 1 capsule (40 mg total) by mouth daily. 30 capsule 11  . Polyethyl Glycol-Propyl Glycol (SYSTANE) 0.4-0.3 % GEL ophthalmic gel Place 1 application into both eyes at bedtime.    . simvastatin (ZOCOR) 40 MG tablet Take 1  tablet (40 mg total) by mouth at bedtime. 90 tablet 3  . vardenafil (LEVITRA) 20 MG tablet Take 20 mg by mouth daily as needed for erectile dysfunction.      No current facility-administered medications on file prior to visit.    Past Medical History  Diagnosis Date  . Sleep apnea   . Hyperlipidemia   . Squamous cell skin cancer     Dr.King [place]  . Benign hypertrophy of prostate     Dr.Dahlstedt  . Hx of colonic polyp 2008 & 2013    X2  . Diverticulosis 2013    Moderate   . Internal hemorrhoids 2013  . Ulcerative esophagitis 2013  . Stricture esophagus 2013     dilation X 1  . GERD (gastroesophageal reflux disease)     Past Surgical History  Procedure Laterality Date  . Nasal sinus surgery    . Uvulectomy & tonsillectomy  2008    Dr Wilburn Cornelia  . Torn biceps tendon      surgical repair  . Rotator cuff repair    . Colonoscopy w/ polypectomy  2013    tubular adenoma  . Colonoscopy w/ polypectomy  2008    hyperplastic  . Ep implantable device N/A 01/03/2016    Procedure: Loop Recorder Insertion;  Surgeon: Sanda Klein, MD;  Location: [facility];  Service: Cardiovascular;  Laterality: N/A;    Social History   Social History  . Marital Status: Married    Spouse Name: N/A  . Number of Children: N/A  . Years of Education:  N/A   Occupational History  . PRESIDENT OF [employer]    Social History Main Topics  . Smoking status: Former Smoker -- 0.50 packs/day for 10 years    Types: Cigarettes  . Smokeless tobacco: Never Used     Comment: smoked 1972-1982, up to 1/2 ppd  . Alcohol Use: 3.0 - 3.6 oz/week    5-6 Standard drinks or equivalent per week     Comment: 3-4 drinks per week  . Drug Use: No  . Sexual Activity: Not Asked   Other Topics Concern  . None   Social History Narrative   GETS REG EXERCISE 4x weekly   NO DIET   Lives with wife in a 2 story home.  Has 2 children.     Works as a Insurance underwriter.     Education: college           Family History  Problem Relation Age of Onset  . Uterine cancer Mother   . Breast cancer Mother     Deceased, 40  . Stroke Father     > 43  . Prostate cancer Father     Deceased, 58  . Diabetes Daughter     type 1  . Stroke Paternal Uncle     > 55  . Heart attack Maternal Grandfather      >55  . Colon cancer Neg Hx   . Rectal cancer Neg Hx   . Stomach cancer Neg Hx   . Hyperlipidemia Son   . Healthy Brother   . Heart attack Maternal Grandfather     mid-60s    Review of Systems  Constitutional: Negative for fever and chills.  Respiratory: Negative for cough, shortness of breath and wheezing.   Cardiovascular: Negative for chest pain, palpitations and leg swelling.  Gastrointestinal: Negative for nausea and abdominal pain.  Neurological: Negative for dizziness, light-headedness and headaches.       Objective:   Filed Vitals:   01/04/16 1127  BP: 130/86  Pulse: 54  Temp: 97.8 F (36.6 C)  Resp: 16   Filed Weights   01/04/16 1127  Weight: 190 lb (86.183 kg)   Body mass index is 23.75 kg/(m^2).   Physical Exam Constitutional: Appears well-developed and well-nourished. No distress.  Neck: Neck supple. No tracheal deviation present. No thyromegaly present.  No carotid bruit. No cervical adenopathy.   Cardiovascular: Normal rate, regular rhythm and normal heart sounds.   No murmur heard.  No edema Pulmonary/Chest: Effort normal and breath sounds normal. No respiratory distress. No wheezes.       Assessment & Plan:   See Problem List for Assessment and Plan of chronic medical problems.   Follow-up annually for physical exam

## 2016-01-04 NOTE — Patient Instructions (Signed)
   Medications reviewed and updated.  No changes recommended at this time.     

## 2016-01-04 NOTE — Assessment & Plan Note (Signed)
Continue aspirin and healthy lifestyle Will watch her blood pressure at home Loop recorder placed yesterday-follow-up in one year with cardiology

## 2016-01-04 NOTE — Assessment & Plan Note (Signed)
Cholesterol well-controlled Continue simvastatin 40 mg daily Continue regular exercise and healthy diet

## 2016-01-04 NOTE — Progress Notes (Signed)
Pre visit review using our clinic review tool, if applicable. No additional management support is needed unless otherwise documented below in the visit note. 

## 2016-01-05 ENCOUNTER — Encounter: Payer: Self-pay | Admitting: Internal Medicine

## 2016-01-05 LAB — HIV ANTIBODY (ROUTINE TESTING W REFLEX): HIV 1&2 Ab, 4th Generation: NONREACTIVE

## 2016-01-12 ENCOUNTER — Ambulatory Visit (INDEPENDENT_AMBULATORY_CARE_PROVIDER_SITE_OTHER): Payer: PRIVATE HEALTH INSURANCE | Admitting: *Deleted

## 2016-01-12 DIAGNOSIS — Z4509 Encounter for adjustment and management of other cardiac device: Secondary | ICD-10-CM

## 2016-01-12 NOTE — Progress Notes (Signed)
Loop check in clinic. Battery status: ok. R-waves 0.6mV. 0 symptom episodes, 0 tachy episodes, 0 pause episodes, 0 brady episodes. 0 AF episodes (0% burden). Monthly summary reports and ROV with Dr. Sallyanne Kuster in 49mo. Wound check appointment. Pt reported noting large wet area on shirt near incision on 01/11/16.  Steri-strips removed. Wound without redness or edema. Incision edges approximated, wound well healed. Instructed to call for any redness, swelling, drainage, fever or chills.

## 2016-02-02 ENCOUNTER — Ambulatory Visit (INDEPENDENT_AMBULATORY_CARE_PROVIDER_SITE_OTHER): Payer: PRIVATE HEALTH INSURANCE | Admitting: *Deleted

## 2016-02-02 DIAGNOSIS — Z8673 Personal history of transient ischemic attack (TIA), and cerebral infarction without residual deficits: Secondary | ICD-10-CM | POA: Diagnosis not present

## 2016-02-02 DIAGNOSIS — Z4509 Encounter for adjustment and management of other cardiac device: Secondary | ICD-10-CM | POA: Diagnosis not present

## 2016-02-03 NOTE — Progress Notes (Signed)
[facility] Summary Report / Loop Recorder 

## 2016-02-14 ENCOUNTER — Encounter: Payer: Self-pay | Admitting: Family

## 2016-02-14 ENCOUNTER — Ambulatory Visit (INDEPENDENT_AMBULATORY_CARE_PROVIDER_SITE_OTHER): Payer: PRIVATE HEALTH INSURANCE | Admitting: Family

## 2016-02-14 VITALS — BP 142/86 | HR 62 | Temp 97.7°F | Resp 16 | Ht 75.0 in | Wt 197.0 lb

## 2016-02-14 DIAGNOSIS — Z9889 Other specified postprocedural states: Secondary | ICD-10-CM

## 2016-02-14 NOTE — Progress Notes (Signed)
   Subjective:    Patient ID: Steve [name], male    DOB: [birth year], 65 y.o.   MRN: [id number]  Chief Complaint  Patient presents with  . Suture / Staple Removal    HPI:  Steve [name] is a 65 y.o. male who  has a past medical history of Sleep apnea; Hyperlipidemia; Squamous cell skin cancer; Benign hypertrophy of prostate; colonic polyp (2008 & 2013); Diverticulosis (2013); Internal hemorrhoids (2013); Ulcerative esophagitis (2013); Stricture esophagus (2013 ); and GERD (gastroesophageal reflux disease). and presents today for a follow up office visit.   1.) Staple removal - Recently seen in his dermatologists office approximately 2 weeks for biopsies with concern for basal cell carcinoma. Biopsy sites located on his bilateral shoulders were closed with staples. Denies fevers or other signs of infection. Has been using triple antibiotic ointment. No pain. Presents today for staple removal.   No Known Allergies   Current Outpatient Prescriptions on File Prior to Visit  Medication Sig Dispense Refill  . aspirin 81 MG tablet Take 81 mg by mouth daily.      . fluticasone (FLONASE) 50 MCG/ACT nasal spray ONE SPRAY IN EACH NOSTRIL TWICE DAILY ASNEEDED -USE CROSSOVER TECHNIQUE DISCUSSED 16 g 2  . omeprazole (PRILOSEC) 40 MG capsule Take 1 capsule (40 mg total) by mouth daily. 30 capsule 11  . Polyethyl Glycol-Propyl Glycol (SYSTANE) 0.4-0.3 % GEL ophthalmic gel Place 1 application into both eyes at bedtime.    . simvastatin (ZOCOR) 40 MG tablet Take 1 tablet (40 mg total) by mouth at bedtime. 90 tablet 3  . vardenafil (LEVITRA) 20 MG tablet Take 20 mg by mouth daily as needed for erectile dysfunction.      No current facility-administered medications on file prior to visit.     Review of Systems  Constitutional: Negative for fever and chills.  Skin: Positive for wound.      Objective:    BP 142/86 mmHg  Pulse 62  Temp(Src) 97.7 F (36.5 C) (Oral)  Resp 16  Ht 6\' 3"   (1.905 m)  Wt 197 lb (89.359 kg)  BMI 24.62 kg/m2  SpO2 97% Nursing note and vital signs reviewed.  Physical Exam  Constitutional: He is oriented to person, place, and time. He appears well-developed and well-nourished. No distress.  Cardiovascular: Normal rate, regular rhythm, normal heart sounds and intact distal pulses.   Pulmonary/Chest: Effort normal and breath sounds normal.  Neurological: He is alert and oriented to person, place, and time.  Skin: Skin is warm and dry.  3-4 cm linear incision sites located on his bilateral shoulders with staples intact and good wound approximation and no evidence of infection.   Psychiatric: He has a normal mood and affect. His behavior is normal. Judgment and thought content normal.       Assessment & Plan:   Problem List Items Addressed This Visit      Other   Status post biopsy of skin - Primary    Biopsied areas are clean and well approximated with granulation tissue noted and no evidence of infection. Staples removed without complication and tolerated well. Continue basic wound care. Monitor for signs of infection. Follow up as needed.          I am having Mr. Baumhardt maintain his aspirin, vardenafil, simvastatin, fluticasone, omeprazole, and Polyethyl Glycol-Propyl Glycol.   Follow-up: Return if symptoms worsen or fail to improve.  Mauricio Po, FNP

## 2016-02-14 NOTE — Assessment & Plan Note (Signed)
Biopsied areas are clean and well approximated with granulation tissue noted and no evidence of infection. Staples removed without complication and tolerated well. Continue basic wound care. Monitor for signs of infection. Follow up as needed.

## 2016-02-14 NOTE — Progress Notes (Signed)
Pre visit review using our clinic review tool, if applicable. No additional management support is needed unless otherwise documented below in the visit note. 

## 2016-02-14 NOTE — Patient Instructions (Addendum)
Thank you for choosing [facility].  Summary/Instructions:  Please continue basic wound care with soap and water.  No dressings are needed.  Continue to watch for signs of infection including pain, redness, swelling or discharge.   If your symptoms worsen or fail to improve, please contact our office for further instruction, or in case of emergency go directly to the emergency room at the closest medical facility.   Wound Closure Removal The staples, stitches, or skin adhesives that were used to close your skin have been removed. You will need to continue the care described here until the wound is completely healed and your health care provider confirms that wound care can be stopped. HOW DO I CARE FOR MY WOUND? How you care for your wound after the wound closure has been removed depends on the kind of wound closure you had. Stitches or Staples  Keep the wound site dry and clean. Do not soak it in water.  If skin adhesive strips were applied after the staples were removed, they will begin to peel off in a few days. Allow them to remain in place until they fall off on their own.  If you still have a bandage (dressing), change it at least once a day or as directed by your health care provider. If the dressing sticks, pour warm, sterile water over it until it loosens and can be removed without pulling apart the wound edges. Pat the area dry with a soft, clean towel. Do not rub the wound because that may cause bleeding.  Apply cream or ointment that stops the growth of bacteria (antibacterial cream or antibacterial ointment) only if your health care provider has directed you to do so.  Place a nonstick bandage over the wound to prevent the dressing from sticking.  Cover the nonstick bandage with a new dressing as directed by your health care provider.  If the bandage becomes wet or dirty or it develops a bad smell, change it as soon as possible.  Take medicines only as directed by  your health care provider. Adhesive Strips or Glue  Adhesive strips and glue peel off on their own.  Leave adhesive strips and glue in place until they fall off. ARE THERE ANY BATHING RESTRICTIONS ONCE MY WOUND CLOSURE IS REMOVED? Do not take baths, swim, or use a hot tub until your health care provider approves. HOW CAN I DECREASE THE SIZE OF MY SCAR? How your scar heals and the size of your scar depend on many factors, such as your age, the type of scar you have, and genetic factors. The following may help decrease the size of your scar:  Sunscreen. Use sunscreen with a sun protection factor (SPF) of at least 15 when out in the sun. Reapply the sunscreen every two hours.  Friction massage. Once your wound is completely healed, you can gently massage the scarred area. This can decrease scar thickness. WHEN SHOULD I SEEK HELP?  Seek help if:  You have a fever.  You have chills.  You have drainage, redness, swelling, or pain at your wound.  There is a bad smell coming from your wound.  Your wound edges open up or do not stay closed after the wound closure has been removed.   This information is not intended to replace advice given to you by your health care provider. Make sure you discuss any questions you have with your health care provider.   Document Released: 09/06/2008 Document Revised: 10/15/2014 Document Reviewed: 02/09/2014 Elsevier Interactive  Patient Education 2016 [employer].

## 2016-03-06 ENCOUNTER — Ambulatory Visit (INDEPENDENT_AMBULATORY_CARE_PROVIDER_SITE_OTHER): Payer: PRIVATE HEALTH INSURANCE | Admitting: *Deleted

## 2016-03-06 DIAGNOSIS — Z4509 Encounter for adjustment and management of other cardiac device: Secondary | ICD-10-CM | POA: Diagnosis not present

## 2016-03-07 NOTE — Progress Notes (Signed)
[facility] Summary Report / Loop Recorder 

## 2016-03-09 ENCOUNTER — Encounter: Payer: Self-pay | Admitting: Internal Medicine

## 2016-03-09 ENCOUNTER — Ambulatory Visit (INDEPENDENT_AMBULATORY_CARE_PROVIDER_SITE_OTHER): Payer: PRIVATE HEALTH INSURANCE | Admitting: Internal Medicine

## 2016-03-09 ENCOUNTER — Other Ambulatory Visit (INDEPENDENT_AMBULATORY_CARE_PROVIDER_SITE_OTHER): Payer: PRIVATE HEALTH INSURANCE

## 2016-03-09 ENCOUNTER — Encounter: Payer: Self-pay | Admitting: Cardiovascular Disease

## 2016-03-09 VITALS — BP 134/86 | HR 79 | Temp 98.4°F | Resp 16 | Wt 194.0 lb

## 2016-03-09 DIAGNOSIS — Z0000 Encounter for general adult medical examination without abnormal findings: Secondary | ICD-10-CM

## 2016-03-09 DIAGNOSIS — K219 Gastro-esophageal reflux disease without esophagitis: Secondary | ICD-10-CM

## 2016-03-09 DIAGNOSIS — G458 Other transient cerebral ischemic attacks and related syndromes: Secondary | ICD-10-CM

## 2016-03-09 DIAGNOSIS — R030 Elevated blood-pressure reading, without diagnosis of hypertension: Secondary | ICD-10-CM

## 2016-03-09 DIAGNOSIS — IMO0002 Reserved for concepts with insufficient information to code with codable children: Secondary | ICD-10-CM | POA: Insufficient documentation

## 2016-03-09 DIAGNOSIS — C4491 Basal cell carcinoma of skin, unspecified: Secondary | ICD-10-CM | POA: Insufficient documentation

## 2016-03-09 LAB — COMPREHENSIVE METABOLIC PANEL
ALT: 29 U/L (ref 0–53)
AST: 22 U/L (ref 0–37)
Albumin: 4.7 g/dL (ref 3.5–5.2)
Alkaline Phosphatase: 61 U/L (ref 39–117)
BUN: 20 mg/dL (ref 6–23)
CO2: 27 mEq/L (ref 19–32)
Calcium: 9.3 mg/dL (ref 8.4–10.5)
Chloride: 102 mEq/L (ref 96–112)
Creatinine, Ser: 1.03 mg/dL (ref 0.40–1.50)
GFR: 76.76 mL/min (ref >60.00)
Glucose, Bld: 112 mg/dL — ABNORMAL HIGH (ref 70–99)
Potassium: 4.4 mEq/L (ref 3.5–5.1)
Sodium: 139 mEq/L (ref 135–145)
Total Bilirubin: 0.6 mg/dL (ref 0.2–1.2)
Total Protein: 7.7 g/dL (ref 6.0–8.3)

## 2016-03-09 LAB — CBC WITH DIFFERENTIAL/PLATELET
Basophils Absolute: 0 10*3/uL (ref 0.0–0.1)
Basophils Relative: 0.4 % (ref 0.0–3.0)
Eosinophils Absolute: 0.1 10*3/uL (ref 0.0–0.7)
Eosinophils Relative: 2.2 % (ref 0.0–5.0)
HCT: 46.7 % (ref 39.0–52.0)
Hemoglobin: 16.1 g/dL (ref 13.0–17.0)
Lymphocytes Relative: 22.9 % (ref 12.0–46.0)
Lymphs Abs: 1.5 10*3/uL (ref 0.7–4.0)
MCHC: 34.4 g/dL (ref 30.0–36.0)
MCV: 91.3 fl (ref 78.0–100.0)
Monocytes Absolute: 0.5 10*3/uL (ref 0.1–1.0)
Monocytes Relative: 7.6 % (ref 3.0–12.0)
Neutro Abs: 4.3 10*3/uL (ref 1.4–7.7)
Neutrophils Relative %: 66.9 % (ref 43.0–77.0)
Platelets: 214 10*3/uL (ref 150.0–400.0)
RBC: 5.11 Mil/uL (ref 4.22–5.81)
RDW: 13 % (ref 11.5–15.5)
WBC: 6.4 10*3/uL (ref 4.0–10.5)

## 2016-03-09 LAB — LIPID PANEL
Cholesterol: 157 mg/dL (ref 0–200)
HDL: 54.4 mg/dL (ref >39.00)
LDL Cholesterol: 92 mg/dL (ref 0–99)
NonHDL: 103.02
Total CHOL/HDL Ratio: 3
Triglycerides: 57 mg/dL (ref 0.0–149.0)
VLDL: 11.4 mg/dL (ref 0.0–40.0)

## 2016-03-09 LAB — HEMOGLOBIN A1C: Hgb A1c MFr Bld: 5.5 % (ref 4.6–6.5)

## 2016-03-09 LAB — TSH: TSH: 2.23 u[IU]/mL (ref 0.35–4.50)

## 2016-03-09 MED ORDER — TERBINAFINE HCL 1 % EX CREA: 1.0000 "application " | TOPICAL_CREAM | Freq: Two times a day (BID) | CUTANEOUS | Status: DC

## 2016-03-09 NOTE — Patient Instructions (Addendum)
Test(s) ordered today. Your results will be released to [place] (or called to you) after review, usually within 72hours after test completion. If any changes need to be made, you will be notified at that same time.  All other Health Maintenance issues reviewed.   All recommended immunizations and age-appropriate screenings are up-to-date or discussed.  No immunizations administered today.   Medications reviewed and updated.  Changes include a cream for your rash.    Your prescription(s) have been submitted to your pharmacy. Please take as directed and contact our office if you believe you are having problem(s) with the medication(s).   Please followup in one year  Health Maintenance, Male A healthy lifestyle and preventative care can promote health and wellness.  Maintain regular health, dental, and eye exams.  Eat a healthy diet. Foods like vegetables, fruits, whole grains, low-fat dairy products, and lean protein foods contain the nutrients you need and are low in calories. Decrease your intake of foods high in solid fats, added sugars, and salt. Get information about a proper diet from your health care provider, if necessary.  Regular physical exercise is one of the most important things you can do for your health. Most adults should get at least 150 minutes of moderate-intensity exercise (any activity that increases your heart rate and causes you to sweat) each week. In addition, most adults need muscle-strengthening exercises on 2 or more days a week.   Maintain a healthy weight. The body mass index (BMI) is a screening tool to identify possible weight problems. It provides an estimate of body fat based on height and weight. Your health care provider can find your BMI and can help you achieve or maintain a healthy weight. For males 20 years and older:  A BMI below 18.5 is considered underweight.  A BMI of 18.5 to 24.9 is normal.  A BMI of 25 to 29.9 is considered overweight.  A  BMI of 30 and above is considered obese.  Maintain normal blood lipids and cholesterol by exercising and minimizing your intake of saturated fat. Eat a balanced diet with plenty of fruits and vegetables. Blood tests for lipids and cholesterol should begin at age 50 and be repeated every 5 years. If your lipid or cholesterol levels are high, you are over age 66, or you are at high risk for heart disease, you may need your cholesterol levels checked more frequently.Ongoing high lipid and cholesterol levels should be treated with medicines if diet and exercise are not working.  If you smoke, find out from your health care provider how to quit. If you do not use tobacco, do not start.  Lung cancer screening is recommended for adults aged 14-80 years who are at high risk for developing lung cancer because of a history of smoking. A yearly low-dose CT scan of the lungs is recommended for people who have at least a 30-pack-year history of smoking and are current smokers or have quit within the past 15 years. A pack year of smoking is smoking an average of 1 pack of cigarettes a day for 1 year (for example, a 30-pack-year history of smoking could mean smoking 1 pack a day for 30 years or 2 packs a day for 15 years). Yearly screening should continue until the smoker has stopped smoking for at least 15 years. Yearly screening should be stopped for people who develop a health problem that would prevent them from having lung cancer treatment.  If you choose to drink alcohol, do not  have more than 2 drinks per day. One drink is considered to be 12 oz (360 mL) of beer, 5 oz (150 mL) of wine, or 1.5 oz (45 mL) of liquor.  Avoid the use of street drugs. Do not share needles with anyone. Ask for help if you need support or instructions about stopping the use of drugs.  High blood pressure causes heart disease and increases the risk of stroke. High blood pressure is more likely to develop in:  People who have blood  pressure in the end of the normal range (100-139/85-89 mm Hg).  People who are overweight or obese.  People who are African American.  If you are 71-59 years of age, have your blood pressure checked every 3-5 years. If you are 52 years of age or older, have your blood pressure checked every year. You should have your blood pressure measured twice--once when you are at a hospital or clinic, and once when you are not at a hospital or clinic. Record the average of the two measurements. To check your blood pressure when you are not at a hospital or clinic, you can use:  An automated blood pressure machine at a pharmacy.  A home blood pressure monitor.  If you are 90-72 years old, ask your health care provider if you should take aspirin to prevent heart disease.  Diabetes screening involves taking a blood sample to check your fasting blood sugar level. This should be done once every 3 years after age 26 if you are at a normal weight and without risk factors for diabetes. Testing should be considered at a younger age or be carried out more frequently if you are overweight and have at least 1 risk factor for diabetes.  Colorectal cancer can be detected and often prevented. Most routine colorectal cancer screening begins at the age of 29 and continues through age 72. However, your health care provider may recommend screening at an earlier age if you have risk factors for colon cancer. On a yearly basis, your health care provider may provide home test kits to check for hidden blood in the stool. A small camera at the end of a tube may be used to directly examine the colon (sigmoidoscopy or colonoscopy) to detect the earliest forms of colorectal cancer. Talk to your health care provider about this at age 69 when routine screening begins. A direct exam of the colon should be repeated every 5-10 years through age 22, unless early forms of precancerous polyps or small growths are found.  People who are at an  increased risk for hepatitis B should be screened for this virus. You are considered at high risk for hepatitis B if:  You were born in a country where hepatitis B occurs often. Talk with your health care provider about which countries are considered high risk.  Your parents were born in a high-risk country and you have not received a shot to protect against hepatitis B (hepatitis B vaccine).  You have HIV or AIDS.  You use needles to inject street drugs.  You live with, or have sex with, someone who has hepatitis B.  You are a man who has sex with other men (MSM).  You get hemodialysis treatment.  You take certain medicines for conditions like cancer, organ transplantation, and autoimmune conditions.  Hepatitis C blood testing is recommended for all people born from [birth year] through 1966 and any individual with known risk factors for hepatitis C.  Healthy men should no longer receive prostate-specific  antigen (PSA) blood tests as part of routine cancer screening. Talk to your health care provider about prostate cancer screening.  Testicular cancer screening is not recommended for adolescents or adult males who have no symptoms. Screening includes self-exam, a health care provider exam, and other screening tests. Consult with your health care provider about any symptoms you have or any concerns you have about testicular cancer.  Practice safe sex. Use condoms and avoid high-risk sexual practices to reduce the spread of sexually transmitted infections (STIs).  You should be screened for STIs, including gonorrhea and chlamydia if:  You are sexually active and are younger than 24 years.  You are older than 24 years, and your health care provider tells you that you are at risk for this type of infection.  Your sexual activity has changed since you were last screened, and you are at an increased risk for chlamydia or gonorrhea. Ask your health care provider if you are at risk.  If you are at  risk of being infected with HIV, it is recommended that you take a prescription medicine daily to prevent HIV infection. This is called pre-exposure prophylaxis (PrEP). You are considered at risk if:  You are a man who has sex with other men (MSM).  You are a heterosexual man who is sexually active with multiple partners.  You take drugs by injection.  You are sexually active with a partner who has HIV.  Talk with your health care provider about whether you are at high risk of being infected with HIV. If you choose to begin PrEP, you should first be tested for HIV. You should then be tested every 3 months for as long as you are taking PrEP.  Use sunscreen. Apply sunscreen liberally and repeatedly throughout the day. You should seek shade when your shadow is shorter than you. Protect yourself by wearing long sleeves, pants, a wide-brimmed hat, and sunglasses year round whenever you are outdoors.  Tell your health care provider of new moles or changes in moles, especially if there is a change in shape or color. Also, tell your health care provider if a mole is larger than the size of a pencil eraser.  A one-time screening for abdominal aortic aneurysm (AAA) and surgical repair of large AAAs by ultrasound is recommended for men aged 33-75 years who are current or former smokers.  Stay current with your vaccines (immunizations).   This information is not intended to replace advice given to you by your health care provider. Make sure you discuss any questions you have with your health care provider.   Document Released: 03/22/2008 Document Revised: 10/15/2014 Document Reviewed: 02/19/2011 Elsevier Interactive Patient Education [employer].

## 2016-03-09 NOTE — Assessment & Plan Note (Signed)
BP close to goal - will continue to monitor  No medication started today

## 2016-03-09 NOTE — Progress Notes (Signed)
Pre visit review using our clinic review tool, if applicable. No additional management support is needed unless otherwise documented below in the visit note. 

## 2016-03-09 NOTE — Assessment & Plan Note (Signed)
GERD controlled, but symptomatic without medication Continue daily medication

## 2016-03-09 NOTE — Progress Notes (Signed)
Subjective:    Patient ID: Steve [name], male    DOB: 1951/05/18, 65 y.o.   MRN: [id number]  HPI He is here for a physical exam.   He just saw his dermatologist and had a couple of places removed by Moh's surgery.  One was [facility] and one was SCC.  He sees derm every 6 months.  Rash:  He has a rash in his groin area that is mildly itchy.  It is very mild, but wanted a cream for it.      Medications and allergies reviewed with patient and updated if appropriate.  Patient Active Problem List   Diagnosis Date Noted  . Status post biopsy of skin 02/14/2016  . 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 History of stroke 12/08/2015  . GERD (gastroesophageal reflux disease) 11/09/2015  . Elevated blood pressure (not hypertension) 11/09/2015  . TIA (transient ischemic attack) 09/30/2015  . Abnormal echocardiogram 09/30/2015  . History of esophageal stricture 04/20/2013  . VITAMIN D DEFICIENCY 06/07/2010  . BENIGN PROSTATIC HYPERTROPHY 06/07/2010  . LIBIDO, DECREASED 06/07/2010  . FIRST DEGREE ATRIOVENTRICULAR BLOCK 02/28/2009  . History of colonic polyps 06/18/2008  . HYPERLIPIDEMIA 08/26/2007  . Abnormal involuntary movement 07/24/2007  . SKIN CANCER, HX OF 07/24/2007  . SYMPTOM, APNEA, SLEEP NOS 07/15/2007    Current Outpatient Prescriptions on File Prior to Visit  Medication Sig Dispense Refill  . aspirin 81 MG tablet Take 81 mg by mouth daily.      . fluticasone (FLONASE) 50 MCG/ACT nasal spray ONE SPRAY IN EACH NOSTRIL TWICE DAILY ASNEEDED -USE CROSSOVER TECHNIQUE DISCUSSED 16 g 2  . omeprazole (PRILOSEC) 40 MG capsule Take 1 capsule (40 mg total) by mouth daily. 30 capsule 11  . Polyethyl Glycol-Propyl Glycol (SYSTANE) 0.4-0.3 % GEL ophthalmic gel Place 1 application into both eyes at bedtime.    . simvastatin (ZOCOR) 40 MG tablet Take 1 tablet (40 mg total) by mouth at bedtime. 90 tablet 3  . vardenafil (LEVITRA) 20 MG tablet Take 20 mg by mouth daily  as needed for erectile dysfunction.      No current facility-administered medications on file prior to visit.    Past Medical History  Diagnosis Date  . Sleep apnea   . Hyperlipidemia   . Squamous cell skin cancer     Dr.King [place]  . Benign hypertrophy of prostate     Dr.Dahlstedt  . Hx of colonic polyp 2008 & 2013    X2  . Diverticulosis 2013    Moderate   . Internal hemorrhoids 2013  . Ulcerative esophagitis 2013  . Stricture esophagus 2013     dilation X 1  . GERD (gastroesophageal reflux disease)     Past Surgical History  Procedure Laterality Date  . Nasal sinus surgery    . Uvulectomy & tonsillectomy  2008    Dr Wilburn Cornelia  . Torn biceps tendon      surgical repair  . Rotator cuff repair    . Colonoscopy w/ polypectomy  2013    tubular adenoma  . Colonoscopy w/ polypectomy  2008    hyperplastic  . Ep implantable device N/A 01/03/2016    Procedure: Loop Recorder Insertion;  Surgeon: Sanda Klein, MD;  Location: [facility];  Service: Cardiovascular;  Laterality: N/A;    Social History   Social History  . Marital Status: Married    Spouse Name: N/A  . Number of Children: N/A  . Years of  Education: N/A   Occupational History  . [employer]    Social History Main Topics  . Smoking status: Former Smoker -- 0.50 packs/day for 10 years    Types: Cigarettes  . Smokeless tobacco: Never Used     Comment: smoked 1972-1982, up to 1/2 ppd  . Alcohol Use: 3.0 - 3.6 oz/week    5-6 Standard drinks or equivalent per week     Comment: 3-4 drinks per week  . Drug Use: No  . Sexual Activity: Not Asked   Other Topics Concern  . None   Social History Narrative   GETS REG EXERCISE 4x weekly   NO DIET   Lives with wife in a 2 story home.  Has 2 children.     Works as a Insurance underwriter.     Education: college          Family History  Problem Relation Age of Onset  . Uterine cancer Mother   . Breast cancer Mother     Deceased,  27  . Stroke Father     > 64  . Prostate cancer Father     Deceased, 40  . Diabetes Daughter     type 1  . Stroke Paternal Uncle     > 55  . Heart attack Maternal Grandfather      >55  . Colon cancer Neg Hx   . Rectal cancer Neg Hx   . Stomach cancer Neg Hx   . Hyperlipidemia Son   . Healthy Brother   . Heart attack Maternal Grandfather     mid-60s    Review of Systems  Constitutional: Negative for fever, chills, appetite change and fatigue.  HENT: Negative for hearing loss.   Eyes: Negative for visual disturbance.  Respiratory: Negative for cough, shortness of breath and wheezing.   Cardiovascular: Negative for chest pain, palpitations and leg swelling.  Gastrointestinal: Negative for nausea, abdominal pain, diarrhea, constipation and blood in stool.  Genitourinary: Negative for dysuria, hematuria and difficulty urinating.  Musculoskeletal: Positive for back pain. Negative for myalgias and arthralgias.  Neurological: Negative for dizziness, light-headedness, numbness and headaches.  Psychiatric/Behavioral: Negative for dysphoric mood. The patient is not nervous/anxious.        Objective:   Filed Vitals:   03/09/16 0809  BP: 134/86  Pulse: 79  Temp: 98.4 F (36.9 C)  Resp: 16   Filed Weights   03/09/16 0809  Weight: 194 lb (87.998 kg)   Body mass index is 24.25 kg/(m^2).   Physical Exam Constitutional: He appears well-developed and well-nourished. No distress.  HENT:  Head: Normocephalic and atraumatic.  Right Ear: External ear normal.  Left Ear: External ear normal.  Mouth/Throat: Oropharynx is clear and moist.  Normal ear canals and TM b/l  Eyes: Conjunctivae and EOM are normal.  Neck: Neck supple. No tracheal deviation present. No thyromegaly present.  No carotid bruit  Cardiovascular: Normal rate, regular rhythm, normal heart sounds and intact distal pulses.   No murmur heard. Pulmonary/Chest: Effort normal and breath sounds normal. No respiratory  distress. He has no wheezes. He has no rales.  Abdominal: Soft. Bowel sounds are normal. He exhibits no distension. There is no tenderness.  Genitourinary: deferred to urology Musculoskeletal: He exhibits no edema.  Lymphadenopathy:    He has no cervical adenopathy.  Skin: Skin is warm and dry. He is not diaphoretic.  Psychiatric: He has a normal mood and affect. His behavior is normal.  Assessment & Plan:   Physical exam: Screening blood work ordered Immunizations Up to date  Colonoscopy Up to date  Eye exams  Up to date  EKG     3017 Exercise - exercising regularly Weight - BMI is normal Skin - h/o skin cancer and just saw derm - goes every 6 months  Substance abuse - no evidence of abuse   Rash in groin area - given location - it is likely a tinea infection Will try an anti-fungal cream Call if no improvement  See Problem List for Assessment and Plan of chronic medical problems.

## 2016-03-09 NOTE — Assessment & Plan Note (Signed)
Loop recorder in place - has f/u with cardio Taking asa and statin daily

## 2016-03-10 ENCOUNTER — Encounter: Payer: Self-pay | Admitting: Internal Medicine

## 2016-03-16 LAB — CUP PACEART REMOTE DEVICE CHECK: Date Time Interrogation Session: 20170609083142

## 2016-04-02 ENCOUNTER — Telehealth: Payer: Self-pay | Admitting: Cardiovascular Disease

## 2016-04-02 ENCOUNTER — Ambulatory Visit (INDEPENDENT_AMBULATORY_CARE_PROVIDER_SITE_OTHER): Payer: PRIVATE HEALTH INSURANCE | Admitting: *Deleted

## 2016-04-02 DIAGNOSIS — Z4509 Encounter for adjustment and management of other cardiac device: Secondary | ICD-10-CM

## 2016-04-02 NOTE — Telephone Encounter (Signed)
Returned patient's call.  Advised that his transmission is received automatically nightly between midnight and 5:00am.  Advised that patient does not need to send a manual transmission for review on the days that "remote transmissions" are scheduled as these are created automatically based on his automatic nightly transmissions.  Patient verbalizes understanding of instructions.  He is aware that he does not need to send a manual transmission unless someone from our office calls to request one.  Patient is appreciative of assistance and denies additional questions or concerns at this time.

## 2016-04-02 NOTE — Telephone Encounter (Signed)
New message      The pt is getting ready, to down load the signal, the pt needs a call back to make sure everything is working well    1. Has your device fired? no  2. Is you device beeping? no  3. Are you experiencing draining or swelling at device site? no  4. Are you calling to see if we received your device transmission? The pt is getting ready to download the transmission  5. Have you passed out? no

## 2016-04-02 NOTE — Progress Notes (Signed)
[facility] Summary Report / Loop Recorder 

## 2016-04-13 LAB — CUP PACEART REMOTE DEVICE CHECK: Date Time Interrogation Session: 20170530131049

## 2016-04-20 LAB — CUP PACEART REMOTE DEVICE CHECK: Date Time Interrogation Session: 20170626180751

## 2016-04-23 ENCOUNTER — Other Ambulatory Visit: Payer: Self-pay | Admitting: Internal Medicine

## 2016-04-24 ENCOUNTER — Encounter: Payer: Self-pay | Admitting: Cardiovascular Disease

## 2016-04-24 ENCOUNTER — Ambulatory Visit (INDEPENDENT_AMBULATORY_CARE_PROVIDER_SITE_OTHER): Payer: PRIVATE HEALTH INSURANCE | Admitting: Cardiovascular Disease

## 2016-04-24 VITALS — BP 140/82 | HR 68 | Ht 74.0 in | Wt 193.8 lb

## 2016-04-24 DIAGNOSIS — E782 Mixed hyperlipidemia: Secondary | ICD-10-CM

## 2016-04-24 DIAGNOSIS — Q211 Atrial septal defect: Secondary | ICD-10-CM | POA: Diagnosis not present

## 2016-04-24 DIAGNOSIS — Z95818 Presence of other cardiac implants and grafts: Secondary | ICD-10-CM | POA: Insufficient documentation

## 2016-04-24 DIAGNOSIS — Q2112 Patent foramen ovale: Secondary | ICD-10-CM

## 2016-04-24 DIAGNOSIS — I253 Aneurysm of heart: Secondary | ICD-10-CM

## 2016-04-24 DIAGNOSIS — Z8673 Personal history of transient ischemic attack (TIA), and cerebral infarction without residual deficits: Secondary | ICD-10-CM | POA: Diagnosis not present

## 2016-04-24 LAB — CUP PACEART INCLINIC DEVICE CHECK: Date Time Interrogation Session: 20170718131733

## 2016-04-24 NOTE — Patient Instructions (Signed)
Dr Croitoru recommends that you schedule a follow-up appointment in 1 year. You will receive a reminder letter in the mail two months in advance. If you don't receive a letter, please call our office to schedule the follow-up appointment.  If you need a refill on your cardiac medications before your next appointment, please call your pharmacy. 

## 2016-04-24 NOTE — Progress Notes (Signed)
Patient ID: Steve [name], male   DOB: May 07, 1951, 66 y.o.   MRN: [id number]     Cardiology Office Note    Date:  04/24/2016   ID:  Steve [name], DOB Aug 17, 1951, MRN [id number]  PCP:  Binnie Rail, MD  Cardiologist:   Sanda Klein, MD   Chief Complaint  Patient presents with  . Follow-up    3 months, pt denied chest pain and SOB    History of Present Illness:  Steve [name] is a 65 y.o. male who presents to discuss His implantable loop recorder, placed on 12/25/2013, was indicated after an unexplained ischemic neurological event that occurred on 01/29/2015. 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  He denies chest pain, dyspnea, syncope or palpitations. He denies leg edema. His erectile dysfunction is treated with PDE 5 inhibitors. He continues exercise regularly without complaints. Not had any new neurological events.  He takes a statin and aspirin 81 mg daily. Blood pressure at home is routinely around 130-135/60. As before, it is marginally elevated in the office today  His loop recorder has not detected any arrhythmia since implantation 3 months ago. It is functioning normally.  Past Medical History  Diagnosis Date  . Sleep apnea   . Hyperlipidemia   . Squamous cell skin cancer     Dr.King [place]  . Benign hypertrophy of prostate     Dr.Dahlstedt  . Hx of colonic polyp 2008 & 2013    X2  . Diverticulosis 2013    Moderate   . Internal hemorrhoids 2013  . Ulcerative esophagitis 2013  . Stricture esophagus 2013     dilation X 1  . GERD (gastroesophageal reflux disease)     Past Surgical History  Procedure Laterality Date  . Nasal sinus surgery    . Uvulectomy & tonsillectomy  2008    Dr Wilburn Cornelia  . Torn biceps tendon      surgical repair  . Rotator cuff repair    . Colonoscopy w/ polypectomy  2013    tubular adenoma  . Colonoscopy w/ polypectomy  2008    hyperplastic  . Ep implantable device  N/A 01/03/2016    Procedure: Loop Recorder Insertion;  Surgeon: Sanda Klein, MD;  Location: [facility];  Service: Cardiovascular;  Laterality: N/A;  . Mohs surgery      x 3    Outpatient Prescriptions Prior to Visit  Medication Sig Dispense Refill  . aspirin 81 MG tablet Take 81 mg by mouth daily.      Marland Kitchen omeprazole (PRILOSEC) 40 MG capsule Take 1 capsule (40 mg total) by mouth daily. 30 capsule 11  . Polyethyl Glycol-Propyl Glycol (SYSTANE) 0.4-0.3 % GEL ophthalmic gel Place 1 application into both eyes at bedtime.    . simvastatin (ZOCOR) 40 MG tablet Take 1 tablet (40 mg total) by mouth at bedtime. 90 tablet 3  . vardenafil (LEVITRA) 20 MG tablet Take 20 mg by mouth daily as needed for erectile dysfunction.     . fluticasone (FLONASE) 50 MCG/ACT nasal spray ONE SPRAY IN EACH NOSTRIL TWICE DAILY ASNEEDED -USE CROSSOVER TECHNIQUE DISCUSSED 16 g 1  . terbinafine (LAMISIL) 1 % cream Apply 1 application topically 2 (two) times daily. 30 g 0   No facility-administered medications prior to visit.     Allergies:   Review of patient's allergies indicates no known allergies.   Social History   Social History  . Marital  Status: Married    Spouse Name: N/A  . Number of Children: N/A  . Years of Education: N/A   Occupational History  . [employer]    Social History Main Topics  . Smoking status: Former Smoker -- 0.50 packs/day for 10 years    Types: Cigarettes  . Smokeless tobacco: Never Used     Comment: smoked 1972-1982, up to 1/2 ppd  . Alcohol Use: 3.0 - 3.6 oz/week    5-6 Standard drinks or equivalent per week     Comment: 3-4 drinks per week  . Drug Use: No  . Sexual Activity: Not on file   Other Topics Concern  . Not on file   Social History Narrative   GETS REG EXERCISE 4x weekly   NO DIET   Lives with wife in a 2 story home.  Has 2 children.     Works as a Insurance underwriter.     Education: college           Family History:  The patient's  family history includes Breast cancer in his mother; Diabetes in his daughter; Healthy in his brother; Heart attack in his maternal grandfather and maternal grandfather; Hyperlipidemia in his son; Prostate cancer in his father; Stroke in his father and paternal uncle; Uterine cancer in his mother. There is no history of Colon cancer, Rectal cancer, or Stomach cancer.   ROS:   Please see the history of present illness.    ROS All other systems reviewed and are negative.   PHYSICAL EXAM:   VS:  BP 140/82 mmHg  Pulse 68  Ht 6\' 2"  (1.88 m)  Wt 87.907 kg (193 lb 12.8 oz)  BMI 24.87 kg/m2   GEN: Well nourished, well developed, in no acute distress HEENT: normal Neck: no JVD, carotid bruits, or masses Cardiac: RRR; no murmurs, rubs, or gallops,no edema  Respiratory:  clear to auscultation bilaterally, normal work of breathing GI: soft, nontender, nondistended, + BS MS: no deformity or atrophy Skin: warm and dry, no rash Neuro:  Alert and Oriented x 3, Strength and sensation are intact he has mild facial asymmetry due to some increased tonus of the muscles on the right side of his face but has normal left facial muscle strength. Psych: euthymic mood, full affect  Wt Readings from Last 3 Encounters:  04/24/16 87.907 kg (193 lb 12.8 oz)  03/09/16 87.998 kg (194 lb)  02/14/16 89.359 kg (197 lb)      Studies/Labs Reviewed:   EKG:  EKG is ordered today.  The ekg ordered today demonstrates honest rhythm with first-degree AV block (PR interval 226 ms), otherwise normal  Recent Labs: 03/09/2016: ALT 29; BUN 20; Creatinine, Ser 1.03; Hemoglobin 16.1; Platelets 214.0; Potassium 4.4; Sodium 139; TSH 2.23   Lipid Panel    Component Value Date/Time   CHOL 157 03/09/2016 0845   CHOL 183 02/07/2015 1535   TRIG 57.0 03/09/2016 0845   TRIG 81 02/07/2015 1535   HDL 54.40 03/09/2016 0845   HDL 63 02/07/2015 1535   CHOLHDL 3 03/09/2016 0845   VLDL 11.4 03/09/2016 0845   LDLCALC 92 03/09/2016 0845    LDLCALC 104* 02/07/2015 1535   LDLDIRECT 172.0 07/24/2007 0000    ASSESSMENT:    1. History of stroke   2. 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 HYPERLIPIDEMIA   4. Status post placement of implantable loop recorder      PLAN:  In order of problems listed above:  1. Stroke: He has had a single ischemic event without residual deficits that occurred more than a year ago. The loop recorder has been in place now for just over 3 months and has not yet identified culprit arrhythmia. 2. PFO: We discussed the fact that patent foramen ovale is a very common finding in healthy individuals. However the presence of a moderate right to left unprovoked shunt and the presence of an interatrial septal aneurysm both increase the likelihood that this could be a pathway for paradoxical embolism. Statistically however this would be a much less common reason for stroke then atrial fibrillation. I think if he is to experience a second neurological event and we still have not identified atrial arrhythmia, he should undergo transesophageal echocardiogram and strong consideration can then be given to closure of the defect if it is confirmed 3. HLP:  At target LDL less than 100. 4. ILR: Continue remote downloads    Medication Adjustments/Labs and Tests Ordered: Current medicines are reviewed at length with the patient today.  Concerns regarding medicines are outlined above.  Medication changes, Labs and Tests ordered today are listed in the Patient Instructions below. There are no Patient Instructions on file for this visit.     Signed, Sanda Klein, MD  04/24/2016 8:48 AM    [facility], [place], [place]  [address] Phone: [phone]; Fax: [phone]

## 2016-05-02 ENCOUNTER — Ambulatory Visit (INDEPENDENT_AMBULATORY_CARE_PROVIDER_SITE_OTHER): Payer: PRIVATE HEALTH INSURANCE | Admitting: *Deleted

## 2016-05-02 DIAGNOSIS — Z4509 Encounter for adjustment and management of other cardiac device: Secondary | ICD-10-CM

## 2016-05-02 NOTE — Progress Notes (Signed)
[facility] Summary Report / Loop Recorder 

## 2016-05-18 LAB — CUP PACEART REMOTE DEVICE CHECK: Date Time Interrogation Session: 20170726183651

## 2016-06-01 ENCOUNTER — Ambulatory Visit (INDEPENDENT_AMBULATORY_CARE_PROVIDER_SITE_OTHER): Payer: PRIVATE HEALTH INSURANCE | Admitting: *Deleted

## 2016-06-01 DIAGNOSIS — Z4509 Encounter for adjustment and management of other cardiac device: Secondary | ICD-10-CM | POA: Diagnosis not present

## 2016-06-01 DIAGNOSIS — Z8673 Personal history of transient ischemic attack (TIA), and cerebral infarction without residual deficits: Secondary | ICD-10-CM

## 2016-06-05 NOTE — Progress Notes (Signed)
[facility] Summary Report / Loop Recorder 

## 2016-06-30 LAB — CUP PACEART REMOTE DEVICE CHECK: Date Time Interrogation Session: 20170825190650

## 2016-06-30 NOTE — Progress Notes (Signed)
Carelink summary report received. Battery status OK. Normal device function. No new symptom episodes, tachy episodes, brady, or pause episodes. No new AF episodes. Monthly summary reports and ROV/PRN 

## 2016-07-02 ENCOUNTER — Ambulatory Visit (INDEPENDENT_AMBULATORY_CARE_PROVIDER_SITE_OTHER): Payer: PRIVATE HEALTH INSURANCE | Admitting: *Deleted

## 2016-07-02 DIAGNOSIS — Z8673 Personal history of transient ischemic attack (TIA), and cerebral infarction without residual deficits: Secondary | ICD-10-CM | POA: Diagnosis not present

## 2016-07-02 NOTE — Progress Notes (Signed)
[facility] Summary Report / Loop Recorder 

## 2016-07-31 ENCOUNTER — Ambulatory Visit (INDEPENDENT_AMBULATORY_CARE_PROVIDER_SITE_OTHER): Payer: PRIVATE HEALTH INSURANCE | Admitting: *Deleted

## 2016-07-31 DIAGNOSIS — Z4509 Encounter for adjustment and management of other cardiac device: Secondary | ICD-10-CM

## 2016-08-01 NOTE — Progress Notes (Signed)
[facility] Summary Report / Loop Recorder 

## 2016-08-12 ENCOUNTER — Encounter: Payer: Self-pay | Admitting: Internal Medicine

## 2016-08-16 LAB — CUP PACEART REMOTE DEVICE CHECK
Date Time Interrogation Session: 20170924193555
Implantable Pulse Generator Implant Date: 20170328

## 2016-08-16 NOTE — Progress Notes (Signed)
Carelink summary report received. Battery status OK. Normal device function. No new symptom episodes, tachy episodes, brady, or pause episodes. No new AF episodes. Monthly summary reports and ROV/PRN 

## 2016-08-20 IMAGING — MR MR MRA HEAD W/O CM
3 series · 29 of 48 positions shown · IV contrast (multihance)
Comparison: None.

CLINICAL DATA: 64-year-old male with transient right upper
extremity weakness and numbness. Full strength has not yet returned.
Loss of coordination. Initial encounter.

EXAM:
MRI HEAD WITHOUT CONTRAST
MRA HEAD WITHOUT CONTRAST
MRA NECK WITHOUT AND WITH CONTRAST
TECHNIQUE: Multiplanar, multiecho pulse sequences of the brain and surrounding
structures were obtained without and with intravenous contrast.
Angiographic images of the Circle of Willis were obtained using MRA
technique without intravenous contrast. Angiographic images of the
neck were obtained using MRA technique without and with intravenous
contrast. Carotid stenosis measurements (when applicable) are
obtained utilizing NASCET criteria, using the distal internal
carotid diameter as the denominator.
CONTRAST:  18mL MULTIHANCE GADOBENATE DIMEGLUMINE 529 MG/ML IV SOLN

[Series 5: fl_tof_2d · axial · 3.0mm · 0.49mm/px · z∈[-211,-114]mm · 10 of 50 slices shown]
[im 1/50]
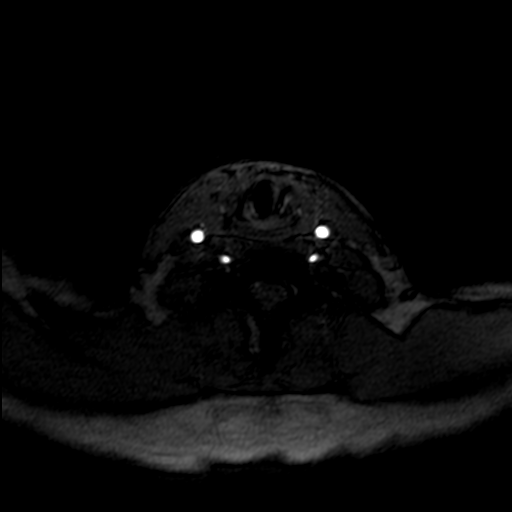
[im 6/50]
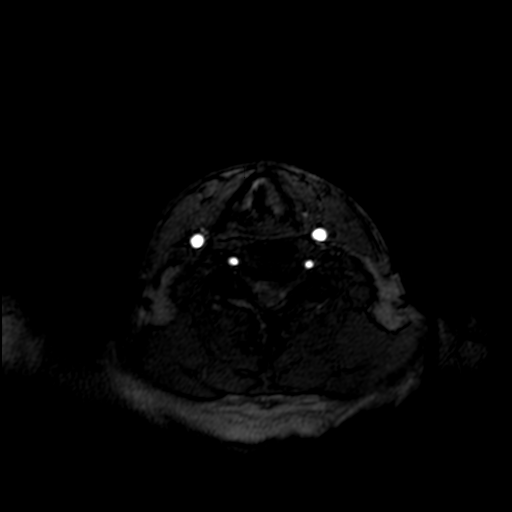
[im 11/50]
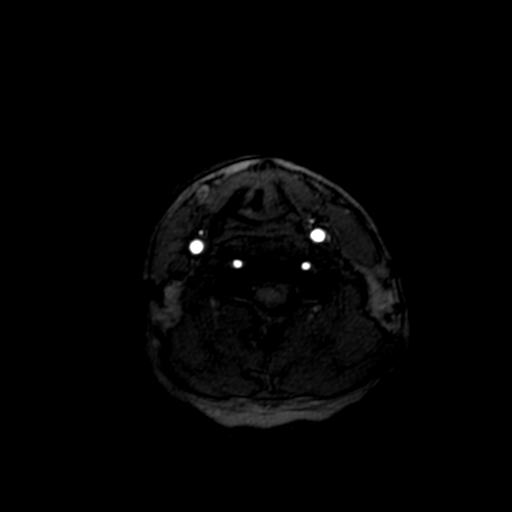
[im 17/50]
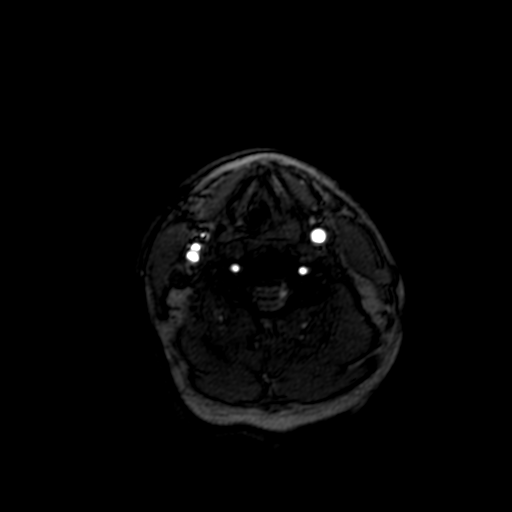
[im 22/50]
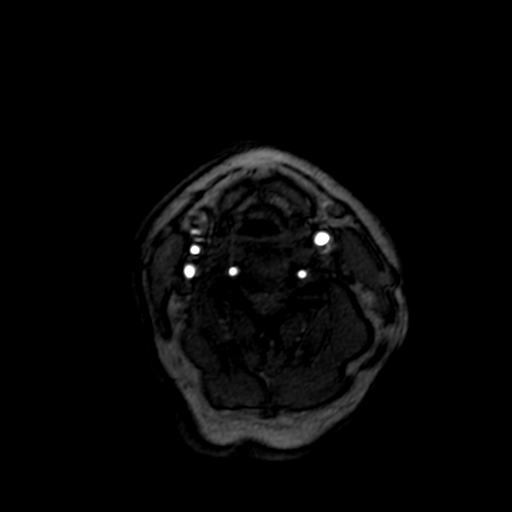
[im 28/50]
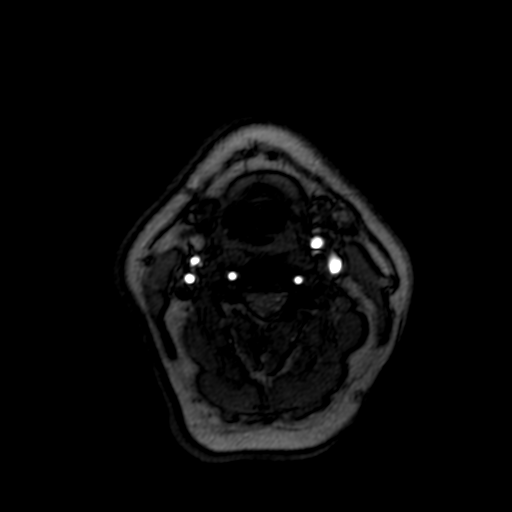
[im 33/50]
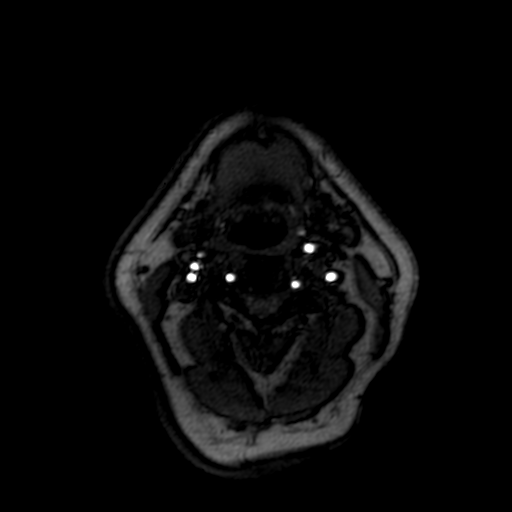
[im 39/50]
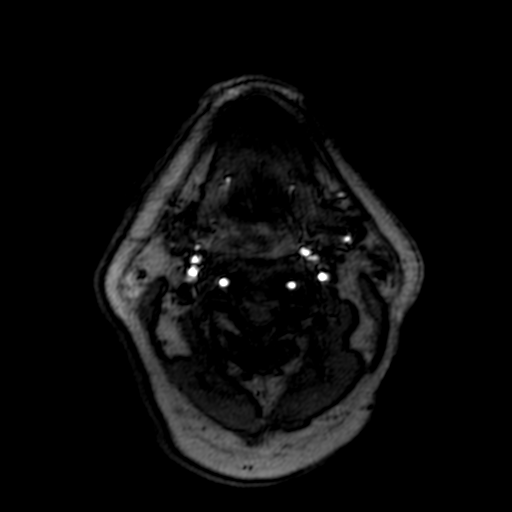
[im 44/50]
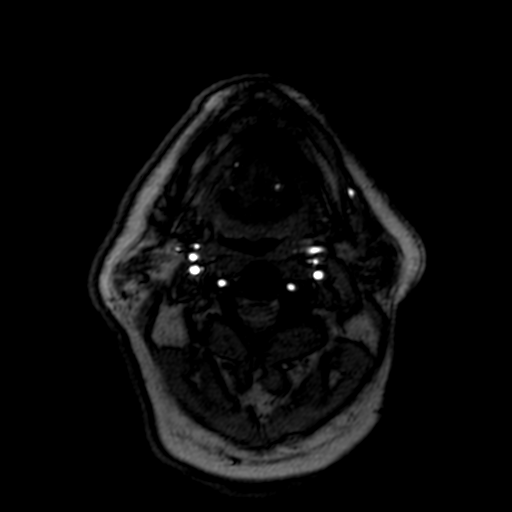
[im 50/50]
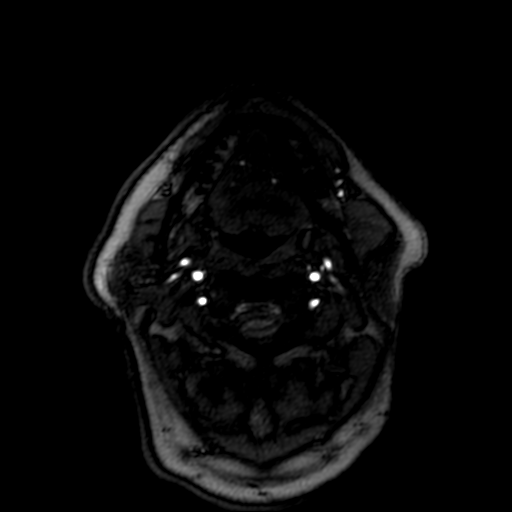

[Series 9: (id)_tt=1.0s · coronal · 0.8mm · 0.78mm/px · 9 of 96 slices shown (1 of 2)]
[im 6/96]
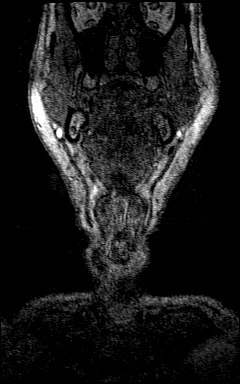
[im 16/96]
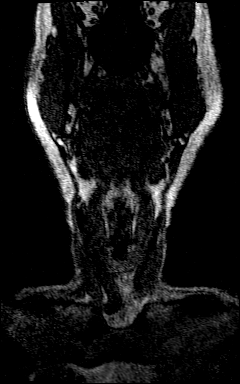
[im 31/96]
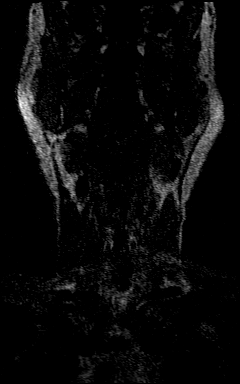
[im 41/96]
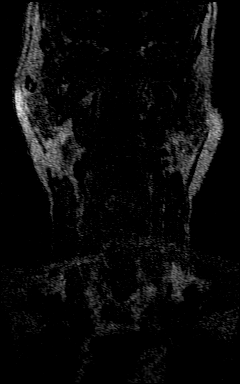
[im 51/96]
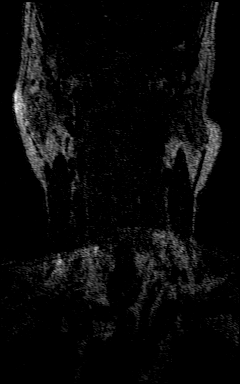
[im 56/96]
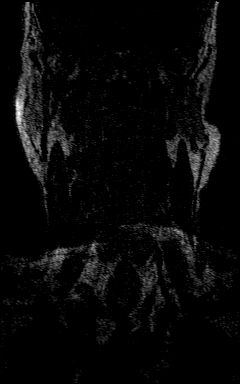
[im 66/96]
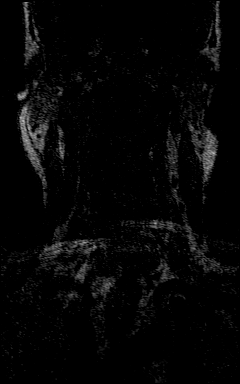
[im 81/96]
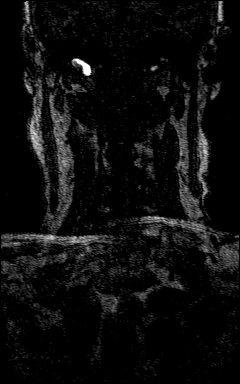
[im 91/96]
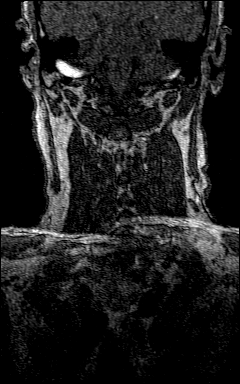

[Series 11: (id)_tt=1.0s · coronal · 0.8mm · 0.78mm/px · 10 of 89 slices shown (2 of 2)]
[im 6/89]
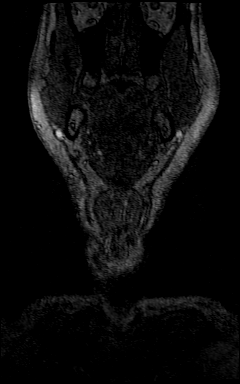
[im 16/89]
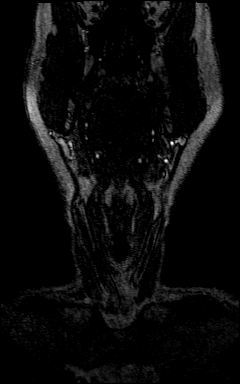
[im 26/89]
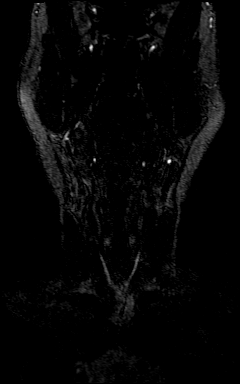
[im 37/89]
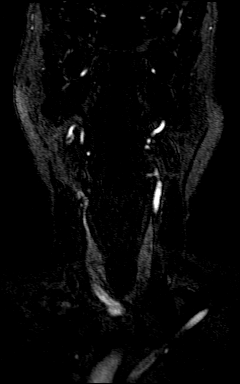
[im 47/89]
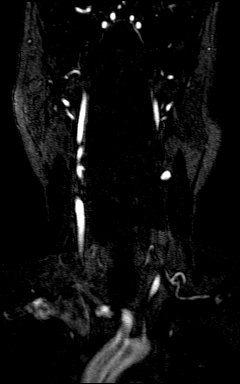
[im 52/89]
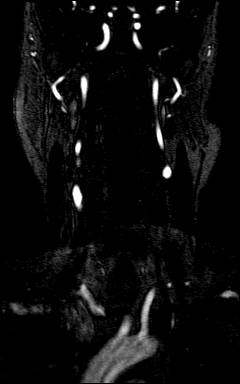
[im 63/89]
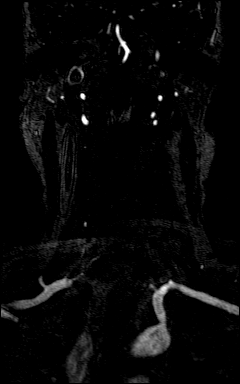
[im 73/89]
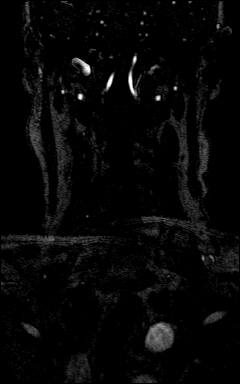
[im 78/89]
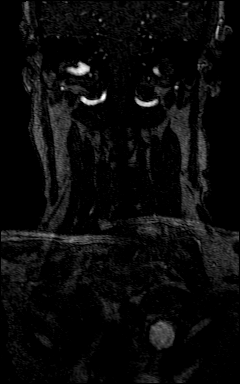
[im 83/89]
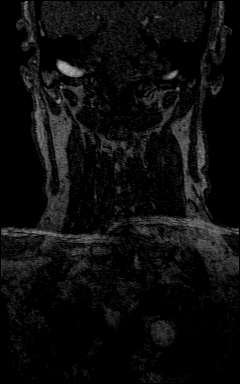

[29 of 48 positions shown; findings below may reference images not displayed]

FINDINGS: MRI HEAD FINDINGS

Subtle increased trace diffusion signal in the left superior frontal
lobe centrum semiovale corresponding tip patchy T2 and FLAIR
hyperintensity. Little if any associated restricted diffusion
(series 9, image 33).

Elsewhere no restricted diffusion. Major intracranial vascular flow
voids are within normal limits.

Cerebral volume is within normal limits for age. No midline shift,
mass effect, evidence of mass lesion, ventriculomegaly, extra-axial
collection or acute intracranial hemorrhage. Cervicomedullary
junction and pituitary are within normal limits. No chronic blood
products or cortical encephalomalacia. Outside of the above signal
changes, gray and white matter signal is normal for age.

Visible internal auditory structures appear normal. Trace right
mastoid effusion, negative nasopharynx. Left mastoids are clear.
Trace paranasal sinus mucosal thickening. Orbits soft tissues appear
normal. Visualized scalp soft tissues are within normal limits.
Normal bone marrow signal. Negative visualized cervical spine.

MRA NECK FINDINGS

Precontrast time-of-flight images demonstrate antegrade flow in both
carotid and vertebral arteries in the neck.

Post-contrast images demonstrate a 3 vessel arch configuration. No
great vessel origin stenosis identified.

Negative right CCA. Widely patent right ICA origin and bulb. Mild
irregularity of the cervical right ICA which otherwise appears
normal.

Negative left CCA. Moderate irregularity at the left carotid
bifurcation, but narrowing of the left ICA origin is less than 50 %
with respect to the distal vessel. Irregularity continues into the
proximal posterior left ICA bulb. No cervical left ICA stenosis.

Suspect artifactual signal loss at the left vertebral artery origin.
The right vertebral origin is normal. The vertebral arteries are
codominant otherwise appear normal throughout the neck.

MRA HEAD FINDINGS

Antegrade flow in codominant distal vertebral arteries. Normal
vertebrobasilar junction. Patent left PICA origin. Dominant
appearing bilateral AICA origins are patent. No basilar artery
stenosis. SCA and PCA origins are normal. Normal right posterior
communicating artery, the left is diminutive or absent. Bilateral
PCA branches are within normal limits.

Antegrade flow in both ICA siphons. No siphon stenosis. Ophthalmic
and right posterior communicating artery origins are normal. Patent
carotid termini. Normal MCA and ACA origins. Diminutive or absent
anterior communicating artery. Visualized ACA branches are within
normal limits. Visualized right MCA branches are within normal
limits.

Left MCA M1 segment and visualized left MCA branches are within
normal limits.
IMPRESSION: 1. Mild white matter signal changes in the left posterior centrum
semiovale with subtle diffusion signal changes suggestive of
subacute white matter infarct. No mass effect or hemorrhage.
2. Otherwise normal for age noncontrast MRI appearance of the brain.
3. Left greater than right carotid bifurcation atherosclerosis with
no hemodynamically significant stenosis in the neck.
4.  Negative intracranial MRA.

## 2016-08-28 LAB — CUP PACEART REMOTE DEVICE CHECK
Date Time Interrogation Session: 20171024203855
Implantable Pulse Generator Implant Date: 20170328

## 2016-08-28 NOTE — Progress Notes (Signed)
Carelink summary report received. Battery status OK. Normal device function. No new symptom episodes, tachy episodes, brady, or pause episodes. No new AF episodes. Monthly summary reports and ROV/PRN 

## 2016-09-03 ENCOUNTER — Ambulatory Visit (INDEPENDENT_AMBULATORY_CARE_PROVIDER_SITE_OTHER): Payer: 59 | Admitting: *Deleted

## 2016-09-03 DIAGNOSIS — Z4509 Encounter for adjustment and management of other cardiac device: Secondary | ICD-10-CM | POA: Diagnosis not present

## 2016-09-03 NOTE — Progress Notes (Signed)
[facility] Summary Report / Loop Recorder 

## 2016-10-02 ENCOUNTER — Ambulatory Visit (INDEPENDENT_AMBULATORY_CARE_PROVIDER_SITE_OTHER): Payer: 59 | Admitting: *Deleted

## 2016-10-02 DIAGNOSIS — Z4509 Encounter for adjustment and management of other cardiac device: Secondary | ICD-10-CM | POA: Diagnosis not present

## 2016-10-02 NOTE — Progress Notes (Signed)
[facility] Summary Report / Loop Recorder 

## 2016-10-13 LAB — CUP PACEART REMOTE DEVICE CHECK
Date Time Interrogation Session: 20171123213723
Implantable Pulse Generator Implant Date: 20170328

## 2016-10-13 NOTE — Progress Notes (Signed)
Carelink summary report received. Battery status OK. Normal device function. No new symptom episodes, tachy episodes, brady, or pause episodes. No new AF episodes. Monthly summary reports and ROV/PRN 

## 2016-10-26 ENCOUNTER — Encounter: Payer: 59 | Admitting: Internal Medicine

## 2016-10-29 ENCOUNTER — Ambulatory Visit (INDEPENDENT_AMBULATORY_CARE_PROVIDER_SITE_OTHER): Payer: 59 | Admitting: *Deleted

## 2016-10-29 DIAGNOSIS — Z4509 Encounter for adjustment and management of other cardiac device: Secondary | ICD-10-CM

## 2016-10-30 NOTE — Progress Notes (Signed)
[facility] Summary Report / Loop Recorder 

## 2016-11-13 ENCOUNTER — Other Ambulatory Visit: Payer: Self-pay | Admitting: Internal Medicine

## 2016-11-22 LAB — CUP PACEART REMOTE DEVICE CHECK
Date Time Interrogation Session: 20171223223945
Implantable Pulse Generator Implant Date: 20170328

## 2016-11-22 NOTE — Progress Notes (Signed)
Carelink summary report received. Battery status OK. Normal device function. No new symptom episodes, tachy episodes, brady, or pause episodes. No new AF episodes. Monthly summary reports and ROV/PRN 

## 2016-11-28 ENCOUNTER — Ambulatory Visit (INDEPENDENT_AMBULATORY_CARE_PROVIDER_SITE_OTHER): Payer: 59 | Admitting: *Deleted

## 2016-11-28 DIAGNOSIS — I639 Cerebral infarction, unspecified: Secondary | ICD-10-CM

## 2016-11-29 NOTE — Progress Notes (Signed)
[facility] Summary Report / Loop Recorder 

## 2016-12-01 LAB — CUP PACEART REMOTE DEVICE CHECK
Date Time Interrogation Session: 20180122230707
Implantable Pulse Generator Implant Date: 20170328

## 2016-12-18 LAB — CUP PACEART REMOTE DEVICE CHECK
Date Time Interrogation Session: 20180221231140
Implantable Pulse Generator Implant Date: 20170328

## 2016-12-28 ENCOUNTER — Ambulatory Visit (INDEPENDENT_AMBULATORY_CARE_PROVIDER_SITE_OTHER): Payer: 59 | Admitting: *Deleted

## 2016-12-28 DIAGNOSIS — I639 Cerebral infarction, unspecified: Secondary | ICD-10-CM

## 2017-01-01 NOTE — Progress Notes (Signed)
[facility] Summary Report / Loop Recorder 

## 2017-01-03 ENCOUNTER — Other Ambulatory Visit: Payer: Self-pay | Admitting: Internal Medicine

## 2017-01-08 LAB — CUP PACEART REMOTE DEVICE CHECK
Date Time Interrogation Session: 20180323234659
Implantable Pulse Generator Implant Date: 20170328

## 2017-01-28 ENCOUNTER — Ambulatory Visit (INDEPENDENT_AMBULATORY_CARE_PROVIDER_SITE_OTHER): Payer: 59 | Admitting: *Deleted

## 2017-01-28 DIAGNOSIS — I639 Cerebral infarction, unspecified: Secondary | ICD-10-CM | POA: Diagnosis not present

## 2017-01-28 NOTE — Progress Notes (Signed)
[facility] Summary Report / Loop Recorder 

## 2017-02-06 ENCOUNTER — Other Ambulatory Visit: Payer: Self-pay

## 2017-02-06 ENCOUNTER — Telehealth: Payer: Self-pay

## 2017-02-06 MED ORDER — OMEPRAZOLE 40 MG PO CPDR: DELAYED_RELEASE_CAPSULE | ORAL | 11 refills | Status: DC

## 2017-02-06 NOTE — Telephone Encounter (Signed)
Script refilled per MD request.

## 2017-02-06 NOTE — Telephone Encounter (Signed)
-----   Message from Irene Shipper, MD sent at 02/06/2017  8:50 AM EDT ----- Regarding: Refill omeprazole Vaughan Basta, Please refill Mr. Vigil's omeprazole prescription for one year. He uses [facility]. Thanks

## 2017-02-14 LAB — CUP PACEART REMOTE DEVICE CHECK
Date Time Interrogation Session: 20180422234023
Implantable Pulse Generator Implant Date: 20170328

## 2017-02-26 ENCOUNTER — Ambulatory Visit (INDEPENDENT_AMBULATORY_CARE_PROVIDER_SITE_OTHER): Payer: 59 | Admitting: *Deleted

## 2017-02-26 DIAGNOSIS — I639 Cerebral infarction, unspecified: Secondary | ICD-10-CM

## 2017-02-27 NOTE — Progress Notes (Signed)
[facility] Summary Report / Loop Recorder 

## 2017-03-01 LAB — CUP PACEART REMOTE DEVICE CHECK
Date Time Interrogation Session: 20180522234000
Implantable Pulse Generator Implant Date: 20170328

## 2017-03-20 ENCOUNTER — Encounter: Payer: Self-pay | Admitting: Internal Medicine

## 2017-03-28 ENCOUNTER — Ambulatory Visit (INDEPENDENT_AMBULATORY_CARE_PROVIDER_SITE_OTHER): Payer: 59 | Admitting: *Deleted

## 2017-03-28 DIAGNOSIS — I639 Cerebral infarction, unspecified: Secondary | ICD-10-CM

## 2017-03-29 NOTE — Progress Notes (Signed)
[facility] Summary Report / Loop Recorder 

## 2017-04-04 LAB — CUP PACEART REMOTE DEVICE CHECK
Date Time Interrogation Session: 20180621234020
Implantable Pulse Generator Implant Date: 20170328

## 2017-04-04 NOTE — Progress Notes (Signed)
Carelink summary report received. Battery status OK. Normal device function. No new symptom episodes, tachy episodes, brady, or pause episodes. No new AF episodes. Monthly summary reports and ROV/PRN 

## 2017-04-08 ENCOUNTER — Encounter: Payer: Self-pay | Admitting: Internal Medicine

## 2017-04-29 ENCOUNTER — Ambulatory Visit (INDEPENDENT_AMBULATORY_CARE_PROVIDER_SITE_OTHER): Payer: 59 | Admitting: *Deleted

## 2017-04-29 DIAGNOSIS — I639 Cerebral infarction, unspecified: Secondary | ICD-10-CM | POA: Diagnosis not present

## 2017-04-30 NOTE — Progress Notes (Signed)
[facility] Summary Report / Loop Recorder 

## 2017-05-12 LAB — CUP PACEART REMOTE DEVICE CHECK
Date Time Interrogation Session: 20180722000946
Implantable Pulse Generator Implant Date: 20170328

## 2017-05-12 NOTE — Progress Notes (Signed)
Carelink summary report received. Battery status OK. Normal device function. No new symptom episodes, tachy episodes, brady, or pause episodes. No new AF episodes. Monthly summary reports and ROV/PRN 

## 2017-05-21 ENCOUNTER — Other Ambulatory Visit: Payer: Self-pay | Admitting: Internal Medicine

## 2017-05-27 ENCOUNTER — Ambulatory Visit (INDEPENDENT_AMBULATORY_CARE_PROVIDER_SITE_OTHER): Payer: 59 | Admitting: *Deleted

## 2017-05-27 DIAGNOSIS — I639 Cerebral infarction, unspecified: Secondary | ICD-10-CM

## 2017-05-28 NOTE — Progress Notes (Signed)
[facility] Summary Report / Loop Recorder 

## 2017-06-01 LAB — CUP PACEART REMOTE DEVICE CHECK
Date Time Interrogation Session: 20180821003841
Implantable Pulse Generator Implant Date: 20170328

## 2017-06-01 NOTE — Progress Notes (Signed)
Carelink summary report received. Battery status OK. Normal device function. No new symptom episodes, tachy episodes, brady, or pause episodes. No new AF episodes. Monthly summary reports and ROV/PRN 

## 2017-06-18 ENCOUNTER — Ambulatory Visit (AMBULATORY_SURGERY_CENTER): Payer: Self-pay | Admitting: *Deleted

## 2017-06-18 VITALS — Ht 73.5 in | Wt 196.0 lb

## 2017-06-18 DIAGNOSIS — Z86010 Personal history of colonic polyps: Secondary | ICD-10-CM

## 2017-06-18 MED ORDER — NA SULFATE-K SULFATE-MG SULF 17.5-3.13-1.6 GM/177ML PO SOLN: 1.0000 | Freq: Once | ORAL | 0 refills | Status: AC

## 2017-06-18 NOTE — Progress Notes (Signed)
Denies allergies to eggs or soy products. Denies complications with sedation or anesthesia. Denies O2 use. Denies use of diet or weight loss medications.  Emmi instructions given for colonoscopy.  

## 2017-06-26 ENCOUNTER — Ambulatory Visit (INDEPENDENT_AMBULATORY_CARE_PROVIDER_SITE_OTHER): Payer: 59 | Admitting: *Deleted

## 2017-06-26 DIAGNOSIS — I639 Cerebral infarction, unspecified: Secondary | ICD-10-CM | POA: Diagnosis not present

## 2017-06-27 LAB — CUP PACEART REMOTE DEVICE CHECK
Date Time Interrogation Session: 20180920004303
Implantable Pulse Generator Implant Date: 20170328

## 2017-06-27 NOTE — Progress Notes (Signed)
[facility] Summary Report / Loop Recorder 

## 2017-07-02 ENCOUNTER — Encounter: Payer: Self-pay | Admitting: Internal Medicine

## 2017-07-02 ENCOUNTER — Ambulatory Visit (AMBULATORY_SURGERY_CENTER): Payer: 59 | Admitting: Internal Medicine

## 2017-07-02 VITALS — BP 138/83 | HR 61 | Temp 97.5°F | Resp 11 | Ht 73.5 in | Wt 196.0 lb

## 2017-07-02 DIAGNOSIS — K6389 Other specified diseases of intestine: Secondary | ICD-10-CM

## 2017-07-02 DIAGNOSIS — Z86010 Personal history of colonic polyps: Secondary | ICD-10-CM

## 2017-07-02 DIAGNOSIS — K635 Polyp of colon: Secondary | ICD-10-CM | POA: Diagnosis not present

## 2017-07-02 DIAGNOSIS — D123 Benign neoplasm of transverse colon: Secondary | ICD-10-CM

## 2017-07-02 MED ORDER — SODIUM CHLORIDE 0.9 % IV SOLN
500.0000 mL | INTRAVENOUS | Status: DC
Start: 2017-07-02 — End: 2017-07-02

## 2017-07-02 NOTE — Op Note (Signed)
[facility] Patient Name: Steve [name] Procedure Date: 07/02/2017 7:47 AM MRN: [id number] Endoscopist: Docia Chuck. Henrene Pastor , MD Age: 66 Referring MD:  Date of Birth: 08/04/1951 Gender: Male Account #: [id number] Procedure:                Colonoscopy with cold snare polypectomy x 1 Indications:              High risk colon cancer surveillance: Personal                            history of non-advanced adenomas. Prior exams 2003,                            2008, 2013 Medicines:                Monitored Anesthesia Care Procedure:                Pre-Anesthesia Assessment:                           - Prior to the procedure, a History and Physical                            was performed, and patient medications and                            allergies were reviewed. The patient's tolerance of                            previous anesthesia was also reviewed. The risks                            and benefits of the procedure and the sedation                            options and risks were discussed with the patient.                            All questions were answered, and informed consent                            was obtained. Prior Anticoagulants: The patient has                            taken no previous anticoagulant or antiplatelet                            agents. ASA Grade Assessment: II - A patient with                            mild systemic disease. After reviewing the risks                            and benefits, the patient was deemed in  satisfactory condition to undergo the procedure.                           After obtaining informed consent, the colonoscope                            was passed under direct vision. Throughout the                            procedure, the patient's blood pressure, pulse, and                            oxygen saturations were monitored continuously. The                            Colonoscope was  introduced through the anus and                            advanced to the the cecum, identified by                            appendiceal orifice and ileocecal valve. The                            ileocecal valve, appendiceal orifice, and rectum                            were photographed. The quality of the bowel                            preparation was excellent. The colonoscopy was                            performed without difficulty. The patient tolerated                            the procedure well. The bowel preparation used was                            SUPREP. Scope In: 8:14:03 AM Scope Out: 8:28:16 AM Scope Withdrawal Time: 0 hours 11 minutes 49 seconds  Total Procedure Duration: 0 hours 14 minutes 13 seconds  Findings:                 A 1 mm polyp was found in the proximal transverse                            colon. The polyp was removed with a cold snare.                            Resection and retrieval were complete.                           Multiple small and large-mouthed diverticula were  found in the left colon.                           Internal hemorrhoids were found during retroflexion.                           The exam was otherwise without abnormality on                            direct and retroflexion views. Complications:            No immediate complications. Estimated blood loss:                            None. Estimated Blood Loss:     Estimated blood loss: none. Impression:               - One 1 mm polyp in the proximal transverse colon,                            removed with a cold snare. Resected and retrieved.                           - Diverticulosis in the left colon.                           - Internal hemorrhoids.                           - The examination was otherwise normal on direct                            and retroflexion views. Recommendation:           - Repeat colonoscopy in 5 years for  surveillance.                           - Patient has a contact number available for                            emergencies. The signs and symptoms of potential                            delayed complications were discussed with the                            patient. Return to normal activities tomorrow.                            Written discharge instructions were provided to the                            patient.                           - Resume previous diet.                           -  Continue present medications.                           - Await pathology results. Docia Chuck. Henrene Pastor, MD 07/02/2017 8:35:59 AM This report has been signed electronically.

## 2017-07-02 NOTE — Progress Notes (Signed)
Report to PACU, RN, vss, BBS= Clear.  

## 2017-07-02 NOTE — Progress Notes (Signed)
Called to room to assist during endoscopic procedure.  Patient ID and intended procedure confirmed with present staff. Received instructions for my participation in the procedure from the performing physician.  

## 2017-07-02 NOTE — Patient Instructions (Signed)
**   We removed one polyp today. Handouts given on Polyps, Diverticulosis, and Hemorrhoids **  YOU HAD AN ENDOSCOPIC PROCEDURE TODAY AT [facility]:   Refer to the procedure report that was given to you for any specific questions about what was found during the examination.  If the procedure report does not answer your questions, please call your gastroenterologist to clarify.  If you requested that your care partner not be given the details of your procedure findings, then the procedure report has been included in a sealed envelope for you to review at your convenience later.  YOU SHOULD EXPECT: Some feelings of bloating in the abdomen. Passage of more gas than usual.  Walking can help get rid of the air that was put into your GI tract during the procedure and reduce the bloating. If you had a lower endoscopy (such as a colonoscopy or flexible sigmoidoscopy) you may notice spotting of blood in your stool or on the toilet paper. If you underwent a bowel prep for your procedure, you may not have a normal bowel movement for a few days.  Please Note:  You might notice some irritation and congestion in your nose or some drainage.  This is from the oxygen used during your procedure.  There is no need for concern and it should clear up in a day or so.  SYMPTOMS TO REPORT IMMEDIATELY:   Following lower endoscopy (colonoscopy or flexible sigmoidoscopy):  Excessive amounts of blood in the stool  Significant tenderness or worsening of abdominal pains  Swelling of the abdomen that is new, acute  Fever of 100F or higher  For urgent or emergent issues, a gastroenterologist can be reached at any hour by calling [phone].   DIET:  We do recommend a small meal at first, but then you may proceed to your regular diet.  Drink plenty of fluids but you should avoid alcoholic beverages for 24 hours.  ACTIVITY:  You should plan to take it easy for the rest of today and you should NOT DRIVE or  use heavy machinery until tomorrow (because of the sedation medicines used during the test).    FOLLOW UP: Our staff will call the number listed on your records the next business day following your procedure to check on you and address any questions or concerns that you may have regarding the information given to you following your procedure. If we do not reach you, we will leave a message.  However, if you are feeling well and you are not experiencing any problems, there is no need to return our call.  We will assume that you have returned to your regular daily activities without incident.  If any biopsies were taken you will be contacted by phone or by letter within the next 1-3 weeks.  Please call us at [phone] if you have not heard about the biopsies in 3 weeks.    SIGNATURES/CONFIDENTIALITY: You and/or your care partner have signed paperwork which will be entered into your electronic medical record.  These signatures attest to the fact that that the information above on your After Visit Summary has been reviewed and is understood.  Full responsibility of the confidentiality of this discharge information lies with you and/or your care-partner.

## 2017-07-03 ENCOUNTER — Telehealth: Payer: Self-pay | Admitting: *Deleted

## 2017-07-03 NOTE — Telephone Encounter (Signed)
  Follow up Call-  Call back number 07/02/2017  Post procedure Call Back phone  # [phone]  Permission to leave phone message Yes  Some recent data might be hidden     Patient questions:  Do you have a fever, pain , or abdominal swelling? No. Pain Score  0 *  Have you tolerated food without any problems? Yes.    Have you been able to return to your normal activities? Yes.    Do you have any questions about your discharge instructions: Diet   No. Medications  No. Follow up visit  No.  Do you have questions or concerns about your Care? No.  Actions: * If pain score is 4 or above: No action needed, pain <4.

## 2017-07-08 ENCOUNTER — Encounter: Payer: Self-pay | Admitting: Internal Medicine

## 2017-07-25 ENCOUNTER — Ambulatory Visit: Payer: 59 | Admitting: *Deleted

## 2017-07-29 ENCOUNTER — Ambulatory Visit (INDEPENDENT_AMBULATORY_CARE_PROVIDER_SITE_OTHER): Payer: 59 | Admitting: *Deleted

## 2017-07-29 DIAGNOSIS — I639 Cerebral infarction, unspecified: Secondary | ICD-10-CM

## 2017-07-29 NOTE — Progress Notes (Signed)
[facility] Summary Report / Loop Recorder 

## 2017-07-30 LAB — CUP PACEART REMOTE DEVICE CHECK
Date Time Interrogation Session: 20181020003943
Implantable Pulse Generator Implant Date: 20170328

## 2017-08-06 ENCOUNTER — Other Ambulatory Visit (INDEPENDENT_AMBULATORY_CARE_PROVIDER_SITE_OTHER): Payer: 59

## 2017-08-06 ENCOUNTER — Encounter: Payer: Self-pay | Admitting: Internal Medicine

## 2017-08-06 ENCOUNTER — Ambulatory Visit (INDEPENDENT_AMBULATORY_CARE_PROVIDER_SITE_OTHER): Payer: 59 | Admitting: Internal Medicine

## 2017-08-06 VITALS — BP 136/88 | HR 66 | Temp 98.1°F | Resp 16 | Ht 73.5 in | Wt 194.0 lb

## 2017-08-06 DIAGNOSIS — Z0000 Encounter for general adult medical examination without abnormal findings: Secondary | ICD-10-CM

## 2017-08-06 DIAGNOSIS — Z23 Encounter for immunization: Secondary | ICD-10-CM | POA: Diagnosis not present

## 2017-08-06 DIAGNOSIS — K219 Gastro-esophageal reflux disease without esophagitis: Secondary | ICD-10-CM

## 2017-08-06 DIAGNOSIS — R252 Cramp and spasm: Secondary | ICD-10-CM | POA: Diagnosis not present

## 2017-08-06 DIAGNOSIS — E782 Mixed hyperlipidemia: Secondary | ICD-10-CM

## 2017-08-06 DIAGNOSIS — G459 Transient cerebral ischemic attack, unspecified: Secondary | ICD-10-CM | POA: Diagnosis not present

## 2017-08-06 DIAGNOSIS — L659 Nonscarring hair loss, unspecified: Secondary | ICD-10-CM | POA: Diagnosis not present

## 2017-08-06 DIAGNOSIS — E538 Deficiency of other specified B group vitamins: Secondary | ICD-10-CM | POA: Insufficient documentation

## 2017-08-06 LAB — MAGNESIUM: Magnesium: 2.5 mg/dL (ref 1.5–2.5)

## 2017-08-06 LAB — LIPID PANEL
Cholesterol: 188 mg/dL (ref 0–200)
HDL: 63.2 mg/dL (ref >39.00)
LDL Cholesterol: 110 mg/dL — ABNORMAL HIGH (ref 0–99)
NonHDL: 124.87
Total CHOL/HDL Ratio: 3
Triglycerides: 76 mg/dL (ref 0.0–149.0)
VLDL: 15.2 mg/dL (ref 0.0–40.0)

## 2017-08-06 LAB — COMPREHENSIVE METABOLIC PANEL
ALT: 35 U/L (ref 0–53)
AST: 29 U/L (ref 0–37)
Albumin: 4.7 g/dL (ref 3.5–5.2)
Alkaline Phosphatase: 60 U/L (ref 39–117)
BUN: 10 mg/dL (ref 6–23)
CO2: 31 mEq/L (ref 19–32)
Calcium: 9.5 mg/dL (ref 8.4–10.5)
Chloride: 100 mEq/L (ref 96–112)
Creatinine, Ser: 0.94 mg/dL (ref 0.40–1.50)
GFR: 84.93 mL/min (ref >60.00)
Glucose, Bld: 100 mg/dL — ABNORMAL HIGH (ref 70–99)
Potassium: 4 mEq/L (ref 3.5–5.1)
Sodium: 138 mEq/L (ref 135–145)
Total Bilirubin: 0.9 mg/dL (ref 0.2–1.2)
Total Protein: 7.8 g/dL (ref 6.0–8.3)

## 2017-08-06 LAB — CBC WITH DIFFERENTIAL/PLATELET
Basophils Absolute: 0 10*3/uL (ref 0.0–0.1)
Basophils Relative: 0.7 % (ref 0.0–3.0)
Eosinophils Absolute: 0.1 10*3/uL (ref 0.0–0.7)
Eosinophils Relative: 1.5 % (ref 0.0–5.0)
HCT: 49.6 % (ref 39.0–52.0)
Hemoglobin: 16.6 g/dL (ref 13.0–17.0)
Lymphocytes Relative: 25.5 % (ref 12.0–46.0)
Lymphs Abs: 1.9 10*3/uL (ref 0.7–4.0)
MCHC: 33.6 g/dL (ref 30.0–36.0)
MCV: 95 fl (ref 78.0–100.0)
Monocytes Absolute: 0.7 10*3/uL (ref 0.1–1.0)
Monocytes Relative: 9.8 % (ref 3.0–12.0)
Neutro Abs: 4.6 10*3/uL (ref 1.4–7.7)
Neutrophils Relative %: 62.5 % (ref 43.0–77.0)
Platelets: 214 10*3/uL (ref 150.0–400.0)
RBC: 5.22 Mil/uL (ref 4.22–5.81)
RDW: 13.2 % (ref 11.5–15.5)
WBC: 7.3 10*3/uL (ref 4.0–10.5)

## 2017-08-06 LAB — VITAMIN B12: Vitamin B-12: 278 pg/mL (ref 211–911)

## 2017-08-06 LAB — TSH: TSH: 2.43 u[IU]/mL (ref 0.35–4.50)

## 2017-08-06 NOTE — Assessment & Plan Note (Signed)
Check lipid panel  Continue daily statin Regular exercise and healthy diet encouraged  

## 2017-08-06 NOTE — Progress Notes (Signed)
Subjective:    Patient ID: Steve [name], male    DOB: 06/20/1951, 66 y.o.   MRN: [id number]  HPI He is here for a physical exam.    He wakes up with cramps in his feet - it does not occur nightly.  On a couple of occasions this was related to dehydration, but he does not always think this is the case.  He flew to [place] and wore compression socks and had some tingling on the lateral aspect of his left foot.  He still has this on occasion, but has improved.  His hair has been thinning more recently.  He wonders if there is anything he can do or take to help prevent this from getting worse.    Medications and allergies reviewed with patient and updated if appropriate.  Patient Active Problem List   Diagnosis Date Noted  . Hair thinning 08/06/2017  . Status post placement of implantable loop recorder 04/24/2016  . Basal cell carcinoma 03/09/2016  . Squamous cell carcinoma 03/09/2016  . 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 History of stroke 12/08/2015  . GERD (gastroesophageal reflux disease) 11/09/2015  . Elevated blood pressure (not hypertension) 11/09/2015  . TIA (transient ischemic attack) 09/30/2015  . Abnormal echocardiogram 09/30/2015  . History of esophageal stricture 04/20/2013  . VITAMIN D DEFICIENCY 06/07/2010  . BENIGN PROSTATIC HYPERTROPHY 06/07/2010  . LIBIDO, DECREASED 06/07/2010  . FIRST DEGREE ATRIOVENTRICULAR BLOCK 02/28/2009  . History of colonic polyps 06/18/2008  . HYPERLIPIDEMIA 08/26/2007  . Abnormal involuntary movement 07/24/2007  . SKIN CANCER, HX OF 07/24/2007  . Sleep apnea 07/15/2007    Current Outpatient Prescriptions on File Prior to Visit  Medication Sig Dispense Refill  . aspirin 81 MG tablet Take 81 mg by mouth daily.      Marland Kitchen omeprazole (PRILOSEC) 40 MG capsule TAKE ONE CAPSULE EACH DAY 30 capsule 11  . simvastatin (ZOCOR) 40 MG tablet Take 1 tablet (40 mg total) by mouth at bedtime. -- Office visit needed  for further refills 90 tablet 0  . vardenafil (LEVITRA) 20 MG tablet Take 20 mg by mouth daily as needed for erectile dysfunction.      No current facility-administered medications on file prior to visit.     Past Medical History:  Diagnosis Date  . Benign hypertrophy of prostate    Dr.Dahlstedt  . Diverticulosis 2013   Moderate   . GERD (gastroesophageal reflux disease)   . Hx of colonic polyp 2008 & 2013   X2  . Hyperlipidemia   . Internal hemorrhoids 2013  . Sleep apnea   . Squamous cell skin cancer    Dr.King Pine[place]  . Stricture esophagus 2013    dilation X 1  . Ulcerative esophagitis 2013    Past Surgical History:  Procedure Laterality Date  . COLONOSCOPY W/ POLYPECTOMY  2013   tubular adenoma  . COLONOSCOPY W/ POLYPECTOMY  2008   hyperplastic  . EP IMPLANTABLE DEVICE N/A 01/03/2016   Procedure: Loop Recorder Insertion;  Surgeon: Sanda Klein, MD;  Location: [facility];  Service: Cardiovascular;  Laterality: N/A;  . MOHS SURGERY     x 3  . NASAL SINUS SURGERY    . ROTATOR CUFF REPAIR    . torn biceps tendon     surgical repair  . uvulectomy & tonsillectomy  2008   Dr Wilburn Cornelia    Social History   Social History  . Marital status: Married  Spouse name: N/A  . Number of children: N/A  . Years of education: N/A   Occupational History  . [employer]    Social History Main Topics  . Smoking status: Former Smoker    Packs/day: 0.50    Years: 10.00    Types: Cigarettes    Quit date: 31  . Smokeless tobacco: Never Used     Comment: smoked 1972-1982, up to 1/2 ppd  . Alcohol use 3.0 - 3.6 oz/week    5 - 6 Standard drinks or equivalent per week     Comment: 3-4 drinks per week  . Drug use: No  . Sexual activity: Not Asked   Other Topics Concern  . None   Social History Narrative   GETS REG EXERCISE 4x weekly   NO DIET   Lives with wife in a 2 story home.  Has 2 children.     Works as a Insurance underwriter.      Education: college          Family History  Problem Relation Age of Onset  . Uterine cancer Mother   . Breast cancer Mother        Deceased, 31  . Stroke Father        > 65  . Prostate cancer Father        Deceased, 29  . Diabetes Daughter        type 1  . Stroke Paternal Uncle        > 55  . Heart attack Maternal Grandfather         >55/mid-60s  . Hyperlipidemia Son   . Healthy Brother   . Colon cancer Neg Hx   . Rectal cancer Neg Hx   . Stomach cancer Neg Hx   . Esophageal cancer Neg Hx     Review of Systems  Constitutional: Negative for appetite change, chills, fatigue and fever.  Eyes: Negative for visual disturbance.  Respiratory: Negative for cough, shortness of breath and wheezing.   Cardiovascular: Negative for chest pain, palpitations and leg swelling.  Gastrointestinal: Negative for abdominal pain, blood in stool, constipation, diarrhea and nausea.  Genitourinary: Negative for difficulty urinating, dysuria and hematuria.  Musculoskeletal: Positive for back pain (controlled). Negative for arthralgias.  Skin: Negative for color change and rash.  Neurological: Negative for dizziness, light-headedness and headaches.  Psychiatric/Behavioral: Negative for dysphoric mood. The patient is not nervous/anxious.        Objective:   Vitals:   08/06/17 1531  BP: 136/88  Pulse: 66  Resp: 16  Temp: 98.1 F (36.7 C)  SpO2: 97%   Filed Weights   08/06/17 1531  Weight: 194 lb (88 kg)   Body mass index is 25.25 kg/m.  Wt Readings from Last 3 Encounters:  08/06/17 194 lb (88 kg)  07/02/17 196 lb (88.9 kg)  06/18/17 196 lb (88.9 kg)     Physical Exam Constitutional: He appears well-developed and well-nourished. No distress.  HENT:  Head: Normocephalic and atraumatic.  Right Ear: External ear normal.  Left Ear: External ear normal.  Mouth/Throat: Oropharynx is clear and moist.  Normal ear canals and TM b/l  Eyes: Conjunctivae and EOM are normal.  Neck:  Neck supple. No tracheal deviation present. No thyromegaly present.  No carotid bruit  Cardiovascular: Normal rate, regular rhythm, normal heart sounds and intact distal pulses.   No murmur heard. Pulmonary/Chest: Effort normal and breath sounds normal. No respiratory distress. He has no wheezes. He has  no rales.  Abdominal: Soft. He exhibits no distension. There is no tenderness.  Genitourinary: deferred To urology Musculoskeletal: He exhibits no edema.  Lymphadenopathy:   He has no cervical adenopathy.  Skin: Skin is warm and dry. He is not diaphoretic.  Psychiatric: He has a normal mood and affect. His behavior is normal.         Assessment & Plan:   Physical exam: Screening blood work  ordered Immunizations  Pneumovax  today ,  Discussed shingrix Colonoscopy  Up to date  Eye exams  Up to date  EKG  Last done 04/2016-EKG today Sinus  Rhythm at 67bpm,-First degree A-V block,PRi = 248, slightly longer PR interval compared to EKGs from 2017, no other changes Exercise - gym, walking dog, plays golf Weight  Normal BMI Skin  - sees derm 2/year Substance abuse    none  See Problem List for Assessment and Plan of chronic medical problems.

## 2017-08-06 NOTE — Assessment & Plan Note (Signed)
GERD controlled Continue daily medication  

## 2017-08-06 NOTE — Patient Instructions (Addendum)
You can try Qilib for your hair.  You try rograine for your hair loss.  You can try taking a hair/skin/nail.    Test(s) ordered today. Your results will be released to [facility] (or called to you) after review, usually within 72hours after test completion. If any changes need to be made, you will be notified at that same time.  All other Health Maintenance issues reviewed.   All recommended immunizations and age-appropriate screenings are up-to-date or discussed.  Pneumovax immunization administered today.   Medications reviewed and updated.  No changes recommended at this time.   Please followup in one year    Health Maintenance, Male A healthy lifestyle and preventive care is important for your health and wellness. Ask your health care provider about what schedule of regular examinations is right for you. What should I know about weight and diet? Eat a Healthy Diet  Eat plenty of vegetables, fruits, whole grains, low-fat dairy products, and lean protein.  Do not eat a lot of foods high in solid fats, added sugars, or salt.  Maintain a Healthy Weight Regular exercise can help you achieve or maintain a healthy weight. You should:  Do at least 150 minutes of exercise each week. The exercise should increase your heart rate and make you sweat (moderate-intensity exercise).  Do strength-training exercises at least twice a week.  Watch Your Levels of Cholesterol and Blood Lipids  Have your blood tested for lipids and cholesterol every 5 years starting at 67 years of age. If you are at high risk for heart disease, you should start having your blood tested when you are 67 years old. You may need to have your cholesterol levels checked more often if: ? Your lipid or cholesterol levels are high. ? You are older than 67 years of age. ? You are at high risk for heart disease.  What should I know about cancer screening? Many types of cancers can be detected early and may often be  prevented. Lung Cancer  You should be screened every year for lung cancer if: ? You are a current smoker who has smoked for at least 30 years. ? You are a former smoker who has quit within the past 15 years.  Talk to your health care provider about your screening options, when you should start screening, and how often you should be screened.  Colorectal Cancer  Routine colorectal cancer screening usually begins at 67 years of age and should be repeated every 5-10 years until you are 67 years old. You may need to be screened more often if early forms of precancerous polyps or small growths are found. Your health care provider may recommend screening at an earlier age if you have risk factors for colon cancer.  Your health care provider may recommend using home test kits to check for hidden blood in the stool.  A small camera at the end of a tube can be used to examine your colon (sigmoidoscopy or colonoscopy). This checks for the earliest forms of colorectal cancer.  Prostate and Testicular Cancer  Depending on your age and overall health, your health care provider may do certain tests to screen for prostate and testicular cancer.  Talk to your health care provider about any symptoms or concerns you have about testicular or prostate cancer.  Skin Cancer  Check your skin from head to toe regularly.  Tell your health care provider about any new moles or changes in moles, especially if: ? There is a change in  a mole's size, shape, or color. ? You have a mole that is larger than a pencil eraser.  Always use sunscreen. Apply sunscreen liberally and repeat throughout the day.  Protect yourself by wearing long sleeves, pants, a wide-brimmed hat, and sunglasses when outside.  What should I know about heart disease, diabetes, and high blood pressure?  If you are 71-29 years of age, have your blood pressure checked every 3-5 years. If you are 79 years of age or older, have your blood  pressure checked every year. You should have your blood pressure measured twice-once when you are at a hospital or clinic, and once when you are not at a hospital or clinic. Record the average of the two measurements. To check your blood pressure when you are not at a hospital or clinic, you can use: ? An automated blood pressure machine at a pharmacy. ? A home blood pressure monitor.  Talk to your health care provider about your target blood pressure.  If you are between 70-70 years old, ask your health care provider if you should take aspirin to prevent heart disease.  Have regular diabetes screenings by checking your fasting blood sugar level. ? If you are at a normal weight and have a low risk for diabetes, have this test once every three years after the age of 5. ? If you are overweight and have a high risk for diabetes, consider being tested at a younger age or more often.  A one-time screening for abdominal aortic aneurysm (AAA) by ultrasound is recommended for men aged 71-75 years who are current or former smokers. What should I know about preventing infection? Hepatitis B If you have a higher risk for hepatitis B, you should be screened for this virus. Talk with your health care provider to find out if you are at risk for hepatitis B infection. Hepatitis C Blood testing is recommended for:  Everyone born from [birth year] through 1966.  Anyone with known risk factors for hepatitis C.  Sexually Transmitted Diseases (STDs)  You should be screened each year for STDs including gonorrhea and chlamydia if: ? You are sexually active and are younger than 67 years of age. ? You are older than 67 years of age and your health care provider tells you that you are at risk for this type of infection. ? Your sexual activity has changed since you were last screened and you are at an increased risk for chlamydia or gonorrhea. Ask your health care provider if you are at risk.  Talk with your health  care provider about whether you are at high risk of being infected with HIV. Your health care provider may recommend a prescription medicine to help prevent HIV infection.  What else can I do?  Schedule regular health, dental, and eye exams.  Stay current with your vaccines (immunizations).  Do not use any tobacco products, such as cigarettes, chewing tobacco, and e-cigarettes. If you need help quitting, ask your health care provider.  Limit alcohol intake to no more than 2 drinks per day. One drink equals 12 ounces of beer, 5 ounces of wine, or 1 ounces of hard liquor.  Do not use street drugs.  Do not share needles.  Ask your health care provider for help if you need support or information about quitting drugs.  Tell your health care provider if you often feel depressed.  Tell your health care provider if you have ever been abused or do not feel safe at home. This information  is not intended to replace advice given to you by your health care provider. Make sure you discuss any questions you have with your health care provider. Document Released: 03/22/2008 Document Revised: 05/23/2016 Document Reviewed: 06/28/2015 Elsevier Interactive Patient Education  Dent [employer]in.

## 2017-08-06 NOTE — Assessment & Plan Note (Signed)
Loop recorder has showed no arrhythmias Taking an aspirin and statin daily Continue

## 2017-08-25 ENCOUNTER — Encounter: Payer: Self-pay | Admitting: Internal Medicine

## 2017-08-26 ENCOUNTER — Ambulatory Visit (INDEPENDENT_AMBULATORY_CARE_PROVIDER_SITE_OTHER): Payer: 59 | Admitting: *Deleted

## 2017-08-26 ENCOUNTER — Other Ambulatory Visit: Payer: Self-pay | Admitting: Internal Medicine

## 2017-08-26 DIAGNOSIS — I639 Cerebral infarction, unspecified: Secondary | ICD-10-CM | POA: Diagnosis not present

## 2017-08-26 NOTE — Progress Notes (Signed)
[facility] Summary Report / Loop Recorder 

## 2017-09-12 LAB — CUP PACEART REMOTE DEVICE CHECK
Date Time Interrogation Session: 20181119011059
Implantable Pulse Generator Implant Date: 20170328

## 2017-09-20 ENCOUNTER — Telehealth: Payer: Self-pay | Admitting: Cardiovascular Disease

## 2017-09-20 NOTE — Telephone Encounter (Signed)
Closed Encounter  °

## 2017-09-24 ENCOUNTER — Ambulatory Visit (INDEPENDENT_AMBULATORY_CARE_PROVIDER_SITE_OTHER): Payer: 59 | Admitting: *Deleted

## 2017-09-24 DIAGNOSIS — I639 Cerebral infarction, unspecified: Secondary | ICD-10-CM

## 2017-09-25 NOTE — Progress Notes (Signed)
[facility] Summary Report / Loop Recorder 

## 2017-10-09 LAB — CUP PACEART REMOTE DEVICE CHECK
Date Time Interrogation Session: 20181219013951
Implantable Pulse Generator Implant Date: 20170328

## 2017-10-24 ENCOUNTER — Ambulatory Visit (INDEPENDENT_AMBULATORY_CARE_PROVIDER_SITE_OTHER): Payer: 59 | Admitting: *Deleted

## 2017-10-24 DIAGNOSIS — I639 Cerebral infarction, unspecified: Secondary | ICD-10-CM

## 2017-10-25 NOTE — Progress Notes (Signed)
[facility] Summary Report / Loop Recorder 

## 2017-10-29 LAB — CUP PACEART REMOTE DEVICE CHECK
Date Time Interrogation Session: 20190118023831
Implantable Pulse Generator Implant Date: 20170328

## 2017-11-25 ENCOUNTER — Ambulatory Visit (INDEPENDENT_AMBULATORY_CARE_PROVIDER_SITE_OTHER): Payer: 59 | Admitting: *Deleted

## 2017-11-25 DIAGNOSIS — I639 Cerebral infarction, unspecified: Secondary | ICD-10-CM

## 2017-11-25 NOTE — Progress Notes (Signed)
[facility] Summary Report / Loop Recorder 

## 2017-12-04 ENCOUNTER — Ambulatory Visit (INDEPENDENT_AMBULATORY_CARE_PROVIDER_SITE_OTHER): Payer: 59 | Admitting: Cardiovascular Disease

## 2017-12-04 ENCOUNTER — Encounter: Payer: Self-pay | Admitting: Cardiovascular Disease

## 2017-12-04 VITALS — BP 132/76 | HR 81 | Ht 75.0 in | Wt 197.0 lb

## 2017-12-04 DIAGNOSIS — I253 Aneurysm of heart: Secondary | ICD-10-CM

## 2017-12-04 DIAGNOSIS — Q2112 Patent foramen ovale: Secondary | ICD-10-CM

## 2017-12-04 DIAGNOSIS — Z95818 Presence of other cardiac implants and grafts: Secondary | ICD-10-CM | POA: Diagnosis not present

## 2017-12-04 DIAGNOSIS — Z8673 Personal history of transient ischemic attack (TIA), and cerebral infarction without residual deficits: Secondary | ICD-10-CM

## 2017-12-04 DIAGNOSIS — E7800 Pure hypercholesterolemia, unspecified: Secondary | ICD-10-CM

## 2017-12-04 DIAGNOSIS — Q211 Atrial septal defect: Secondary | ICD-10-CM

## 2017-12-04 LAB — CUP PACEART INCLINIC DEVICE CHECK
Date Time Interrogation Session: 20190313133922
Implantable Pulse Generator Implant Date: 20170328

## 2017-12-04 NOTE — Patient Instructions (Signed)
Dr Sallyanne Kuster recommends that you schedule a follow-up appointment in 12 months. You will receive a reminder letter in the mail two months in advance. If you don't receive a letter, please call our office to schedule the follow-up appointment.  If you need a refill on your cardiac medications before your next appointment, please call your pharmacy.   You have been referred to Dr Ezzie Dural for possible PFO closure.

## 2017-12-04 NOTE — Progress Notes (Signed)
Patient ID: Steve [name], male   DOB: January 19, 1951, 67 y.o.   MRN: [id number]     Cardiology Office Note    Date:  12/05/2017   ID:  Steve [name], DOB 09/20/1951, MRN [id number]  PCP:  Binnie Rail, MD  Cardiologist:   Sanda Klein, MD   No chief complaint on file.   History of Present Illness:  Steve [name] is a 67 y.o. male who presents to discuss his loop recorder implanted in March 2017 after an unexplained ischemic neurological event in 2016, manifesting primarily as loss of sensation in his right upper extremity with confirmed ischemic stroke on MRI. 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  He has not had any subsequent neurological events on treatment with low-dose aspirin only.  The patient specifically denies any chest pain at rest exertion, dyspnea at rest or with exertion, orthopnea, paroxysmal nocturnal dyspnea, syncope, palpitations, focal neurological deficits, intermittent claudication, lower extremity edema, unexplained weight gain, cough, hemoptysis or wheezing.  He is physically active without complaints.  Since implantation almost 2 years ago his device has not shown any atrial fibrillation.  It has only recorded 1 event which was an asymptomatic we discussed the new data that has emerged about PFO closure devices 3-second pause occurring during sleep in May 2018.  We reviewed the changes in orr understanding about the relationship between PFOs and cryptogenic strokes and the benefit of closure devices that have emerged in the 3 years since his original diagnosis.  Past Medical History:  Diagnosis Date  . Benign hypertrophy of prostate    Dr.Dahlstedt  . Diverticulosis 2013   Moderate   . GERD (gastroesophageal reflux disease)   . Hx of colonic polyp 2008 & 2013   X2  . Hyperlipidemia   . Internal hemorrhoids 2013  . Sleep apnea   . Squamous cell skin cancer    Dr.King [place]  . Stricture  esophagus 2013    dilation X 1  . Ulcerative esophagitis 2013    Past Surgical History:  Procedure Laterality Date  . COLONOSCOPY W/ POLYPECTOMY  2013   tubular adenoma  . COLONOSCOPY W/ POLYPECTOMY  2008   hyperplastic  . EP IMPLANTABLE DEVICE N/A 01/03/2016   Procedure: Loop Recorder Insertion;  Surgeon: Sanda Klein, MD;  Location: [facility];  Service: Cardiovascular;  Laterality: N/A;  . MOHS SURGERY     x 3  . NASAL SINUS SURGERY    . ROTATOR CUFF REPAIR    . torn biceps tendon     surgical repair  . uvulectomy & tonsillectomy  2008   Dr Wilburn Cornelia    Outpatient Medications Prior to Visit  Medication Sig Dispense Refill  . aspirin 81 MG tablet Take 81 mg by mouth daily.      Marland Kitchen omeprazole (PRILOSEC) 40 MG capsule TAKE ONE CAPSULE EACH DAY 30 capsule 11  . simvastatin (ZOCOR) 40 MG tablet TAKE ONE TABLET AT BEDTIME 90 tablet 3  . vardenafil (LEVITRA) 20 MG tablet Take 20 mg by mouth daily as needed for erectile dysfunction.      No facility-administered medications prior to visit.      Allergies:   Patient has no known allergies.   Social History   Socioeconomic History  . Marital status: Married    Spouse name: None  . Number of children: None  . Years of education: None  . Highest education level: None  Social  Needs  . Financial resource strain: None  . Food insecurity - worry: None  . Food insecurity - inability: None  . Transportation needs - medical: None  . Transportation needs - non-medical: None  Occupational History  . Occupation: PRESIDENT OF [employer]  Tobacco Use  . Smoking status: Former Smoker    Packs/day: 0.50    Years: 10.00    Pack years: 5.00    Types: Cigarettes    Last attempt to quit: 1983    Years since quitting: 36.1  . Smokeless tobacco: Never Used  . Tobacco comment: smoked 1972-1982, up to 1/2 ppd  Substance and Sexual Activity  . Alcohol use: Yes    Alcohol/week: 3.0 - 3.6 oz    Types: 5 - 6 Standard drinks  or equivalent per week    Comment: 3-4 drinks per week  . Drug use: No  . Sexual activity: None  Other Topics Concern  . None  Social History Narrative   GETS REG EXERCISE 4x weekly   NO DIET   Lives with wife in a 2 story home.  Has 2 children.     Works as a Insurance underwriter.     Education: college        Family History:  The patient's family history includes Breast cancer in his mother; Diabetes in his daughter; Healthy in his brother; Heart attack in his maternal grandfather; Hyperlipidemia in his son; Prostate cancer in his father; Stroke in his father and paternal uncle; Uterine cancer in his mother.   ROS:   Please see the history of present illness.    ROS All other systems reviewed and are negative.   PHYSICAL EXAM:   VS:  BP 132/76   Pulse 81   Ht 6\' 3"  (1.905 m)   Wt 197 lb (89.4 kg)   SpO2 96%   BMI 24.62 kg/m     General: Alert, oriented x3, no distress, appears lean and fit Head: no evidence of trauma, PERRL, EOMI, no exophtalmos or lid lag, no myxedema, no xanthelasma; normal ears, nose and oropharynx Neck: normal jugular venous pulsations and no hepatojugular reflux; brisk carotid pulses without delay and no carotid bruits Chest: clear to auscultation, no signs of consolidation by percussion or palpation, normal fremitus, symmetrical and full respiratory excursions Cardiovascular: normal position and quality of the apical impulse, regular rhythm, normal first and second heart sounds, no murmurs, rubs or gallops Abdomen: no tenderness or distention, no masses by palpation, no abnormal pulsatility or arterial bruits, normal bowel sounds, no hepatosplenomegaly Extremities: no clubbing, cyanosis or edema; 2+ radial, ulnar and brachial pulses bilaterally; 2+ right femoral, posterior tibial and dorsalis pedis pulses; 2+ left femoral, posterior tibial and dorsalis pedis pulses; no subclavian or femoral bruits Neurological: grossly nonfocal except he has mild facial  asymmetry due to some increased tonus of the muscles on the right side of his face but has normal left facial muscle strength.  This is unchanged since his last appointment Psych: Normal mood and affect   Wt Readings from Last 3 Encounters:  12/04/17 197 lb (89.4 kg)  08/06/17 194 lb (88 kg)  07/02/17 196 lb (88.9 kg)      Studies/Labs Reviewed:   EKG:  EKG is not ordered today.    Recent Labs: 08/06/2017: ALT 35; BUN 10; Creatinine, Ser 0.94; Hemoglobin 16.6; Magnesium 2.5; Platelets 214.0; Potassium 4.0; Sodium 138; TSH 2.43   Lipid Panel    Component Value Date/Time   CHOL 188 08/06/2017 1619  CHOL 183 02/07/2015 1535   TRIG 76.0 08/06/2017 1619   TRIG 81 02/07/2015 1535   HDL 63.20 08/06/2017 1619   HDL 63 02/07/2015 1535   CHOLHDL 3 08/06/2017 1619   VLDL 15.2 08/06/2017 1619   LDLCALC 110 (H) 08/06/2017 1619   LDLCALC 104 (H) 02/07/2015 1535   LDLDIRECT 172.0 07/24/2007 0000    ASSESSMENT:    1. PFO with atrial septal aneurysm   2. History of arterial ischemic stroke   3. Hypercholesterolemia   4. Status post placement of implantable loop recorder      PLAN:  In order of problems listed above:   1. PFO: We discussed the fact that patent foramen ovale is a very common finding in healthy individuals. However the presence of a moderate right to left unprovoked shunt and the presence of an interatrial septal aneurysm both increase the likelihood that this could be a pathway for paradoxical embolism.  I think we should consider a transesophageal echocardiogram and strong consideration can then be given to closure of the defect if it is confirmed.  I will make him an appointment with Dr. Sherren Mocha to discuss his options after the TEE. This procedure has been fully reviewed with the patient and informed consent has been obtained. 2. Stroke: He has had a single ischemic event without residual deficits that occurred more than 2 years ago. The loop recorder has been  in place now for almost 2 years and has never shown atrial fibrillation.  It is very unlikely that atrial fibrillation will be uncovered in the year until the battery will decay.  It becomes therefore more likely that he may have had a paradoxical embolism stroke related to his PFO. 3. HLP:  Not quite at  target LDL less than 100 on statin.  He prefers to try to improve his diet and exercise level, rather than switch to a more potent statin. 4. ILR: Will stop monthly remote downloads. Unlikely they will be helpful.    Medication Adjustments/Labs and Tests Ordered: Current medicines are reviewed at length with the patient today.  Concerns regarding medicines are outlined above.  Medication changes, Labs and Tests ordered today are listed in the Patient Instructions below. Patient Instructions  Dr Sallyanne Kuster recommends that you schedule a follow-up appointment in 12 months. You will receive a reminder letter in the mail two months in advance. If you don't receive a letter, please call our office to schedule the follow-up appointment.  If you need a refill on your cardiac medications before your next appointment, please call your pharmacy.   You have been referred to Dr Ezzie Dural for possible PFO closure.      Signed, Sanda Klein, MD  12/05/2017 5:35 PM    [facility] [address], [place], [place]  [address] Phone: [phone]; Fax: [phone]

## 2017-12-06 ENCOUNTER — Telehealth: Payer: Self-pay

## 2017-12-06 DIAGNOSIS — I253 Aneurysm of heart: Secondary | ICD-10-CM

## 2017-12-06 DIAGNOSIS — Q211 Atrial septal defect: Secondary | ICD-10-CM

## 2017-12-06 DIAGNOSIS — Q2112 Patent foramen ovale: Secondary | ICD-10-CM

## 2017-12-06 DIAGNOSIS — Z01812 Encounter for preprocedural laboratory examination: Secondary | ICD-10-CM

## 2017-12-06 NOTE — Telephone Encounter (Signed)
Patient scheduled for TEE on 12/23/17 at 8:00. Patient aware. Instruction letter drafted and labs ordered to be mailed to patient.  Appt with Burt Knack cancelled. Theodosia Quay, RN will reach out to patient to reschedule.

## 2017-12-06 NOTE — Telephone Encounter (Signed)
-----   Message from Sanda Klein, MD sent at 12/05/2017  5:36 PM EST ----- Need to schedule for TEE before [facility] appointment. I spoke with him and first available for him is Monday 18 ( the day he has the [place] appointment...) May have to push the appointment with Burt Knack off for later that day or another day altogether. Please call him tomorrow. MCr

## 2017-12-10 ENCOUNTER — Other Ambulatory Visit: Payer: Self-pay | Admitting: Cardiovascular Disease

## 2017-12-10 ENCOUNTER — Telehealth: Payer: Self-pay | Admitting: Cardiovascular Disease

## 2017-12-10 DIAGNOSIS — Q211 Atrial septal defect: Secondary | ICD-10-CM

## 2017-12-10 DIAGNOSIS — Q2112 Patent foramen ovale: Secondary | ICD-10-CM

## 2017-12-10 DIAGNOSIS — I253 Aneurysm of heart: Secondary | ICD-10-CM

## 2017-12-10 NOTE — Telephone Encounter (Signed)
New message    Patient calling to change procedure date from 3/18. Please call

## 2017-12-11 NOTE — Telephone Encounter (Signed)
lmtcb

## 2017-12-12 NOTE — Telephone Encounter (Signed)
Follow up ° ° °Patient is returning call. Please call. °

## 2017-12-17 NOTE — Telephone Encounter (Signed)
TEE rescheduled for 01/06/18 with Dr Sallyanne Kuster at [facility]. Patient aware.

## 2017-12-23 ENCOUNTER — Institutional Professional Consult (permissible substitution): Payer: 59 | Admitting: Cardiovascular Disease

## 2017-12-23 LAB — CUP PACEART REMOTE DEVICE CHECK
Date Time Interrogation Session: 20190218111007
Implantable Pulse Generator Implant Date: 20170328

## 2017-12-30 ENCOUNTER — Ambulatory Visit (INDEPENDENT_AMBULATORY_CARE_PROVIDER_SITE_OTHER): Payer: 59 | Admitting: *Deleted

## 2017-12-30 DIAGNOSIS — I639 Cerebral infarction, unspecified: Secondary | ICD-10-CM

## 2017-12-30 NOTE — Progress Notes (Signed)
[facility] Summary Report / Loop Recorder 

## 2017-12-31 LAB — BASIC METABOLIC PANEL
BUN/Creatinine Ratio: 12 (ref 10–24)
BUN: 13 mg/dL (ref 8–27)
CO2: 23 mmol/L (ref 20–29)
Calcium: 9.1 mg/dL (ref 8.6–10.2)
Chloride: 100 mmol/L (ref 96–106)
Creatinine, Ser: 1.06 mg/dL (ref 0.76–1.27)
GFR calc Af Amer: 84 mL/min/{1.73_m2} (ref >59)
GFR calc non Af Amer: 72 mL/min/{1.73_m2} (ref >59)
Glucose: 99 mg/dL (ref 65–99)
Potassium: 4.7 mmol/L (ref 3.5–5.2)
Sodium: 138 mmol/L (ref 134–144)

## 2017-12-31 LAB — CBC
Hematocrit: 44.8 % (ref 37.5–51.0)
Hemoglobin: 16.3 g/dL (ref 13.0–17.7)
MCH: 32.7 pg (ref 26.6–33.0)
MCHC: 36.4 g/dL — ABNORMAL HIGH (ref 31.5–35.7)
MCV: 90 fL (ref 79–97)
Platelets: 175 10*3/uL (ref 150–379)
RBC: 4.99 x10E6/uL (ref 4.14–5.80)
RDW: 12.9 % (ref 12.3–15.4)
WBC: 5 10*3/uL (ref 3.4–10.8)

## 2017-12-31 LAB — PROTIME-INR
INR: 1.1 (ref 0.8–1.2)
Prothrombin Time: 11 s (ref 9.1–12.0)

## 2018-01-06 ENCOUNTER — Encounter (HOSPITAL_COMMUNITY): Payer: Self-pay | Admitting: *Deleted

## 2018-01-06 ENCOUNTER — Ambulatory Visit (HOSPITAL_COMMUNITY)
Admission: RE | Admit: 2018-01-06 | Discharge: 2018-01-06 | Disposition: A | Discharge status: Home / Self Care | Payer: 59 | Source: Ambulatory Visit | Attending: Cardiovascular Disease | Admitting: Cardiovascular Disease

## 2018-01-06 ENCOUNTER — Other Ambulatory Visit: Payer: Self-pay

## 2018-01-06 ENCOUNTER — Encounter (HOSPITAL_COMMUNITY)
Admission: RE | Disposition: A | Discharge status: Home / Self Care | Payer: Self-pay | Source: Ambulatory Visit | Attending: Cardiovascular Disease

## 2018-01-06 ENCOUNTER — Ambulatory Visit (HOSPITAL_BASED_OUTPATIENT_CLINIC_OR_DEPARTMENT_OTHER): Payer: 59

## 2018-01-06 DIAGNOSIS — E785 Hyperlipidemia, unspecified: Secondary | ICD-10-CM | POA: Diagnosis not present

## 2018-01-06 DIAGNOSIS — Z7982 Long term (current) use of aspirin: Secondary | ICD-10-CM | POA: Insufficient documentation

## 2018-01-06 DIAGNOSIS — Z8249 Family history of ischemic heart disease and other diseases of the circulatory system: Secondary | ICD-10-CM | POA: Insufficient documentation

## 2018-01-06 DIAGNOSIS — Z823 Family history of stroke: Secondary | ICD-10-CM | POA: Diagnosis not present

## 2018-01-06 DIAGNOSIS — Z8673 Personal history of transient ischemic attack (TIA), and cerebral infarction without residual deficits: Secondary | ICD-10-CM

## 2018-01-06 DIAGNOSIS — I253 Aneurysm of heart: Secondary | ICD-10-CM | POA: Diagnosis not present

## 2018-01-06 DIAGNOSIS — Z85828 Personal history of other malignant neoplasm of skin: Secondary | ICD-10-CM | POA: Insufficient documentation

## 2018-01-06 DIAGNOSIS — I639 Cerebral infarction, unspecified: Secondary | ICD-10-CM | POA: Diagnosis not present

## 2018-01-06 DIAGNOSIS — Z87891 Personal history of nicotine dependence: Secondary | ICD-10-CM | POA: Insufficient documentation

## 2018-01-06 DIAGNOSIS — Z79899 Other long term (current) drug therapy: Secondary | ICD-10-CM | POA: Insufficient documentation

## 2018-01-06 DIAGNOSIS — K219 Gastro-esophageal reflux disease without esophagitis: Secondary | ICD-10-CM | POA: Diagnosis not present

## 2018-01-06 DIAGNOSIS — Q211 Atrial septal defect: Secondary | ICD-10-CM | POA: Diagnosis not present

## 2018-01-06 HISTORY — PX: TEE WITHOUT CARDIOVERSION: SHX5443

## 2018-01-06 SURGERY — ECHOCARDIOGRAM, TRANSESOPHAGEAL
Anesthesia: Moderate Sedation

## 2018-01-06 MED ORDER — BUTAMBEN-TETRACAINE-BENZOCAINE 2-2-14 % EX AERO
INHALATION_SPRAY | CUTANEOUS | Status: DC | PRN
  Administered 2018-01-06: 2 via TOPICAL

## 2018-01-06 MED ORDER — MIDAZOLAM HCL 10 MG/2ML IJ SOLN
INTRAMUSCULAR | Status: DC | PRN
  Administered 2018-01-06 (×2): 2 mg via INTRAVENOUS

## 2018-01-06 MED ORDER — SODIUM CHLORIDE 0.9 % IV SOLN: INTRAVENOUS | Status: DC

## 2018-01-06 MED ORDER — FENTANYL CITRATE (PF) 100 MCG/2ML IJ SOLN
INTRAMUSCULAR | Status: DC | PRN
  Administered 2018-01-06 (×2): 25 ug via INTRAVENOUS

## 2018-01-06 MED ORDER — MIDAZOLAM HCL 5 MG/ML IJ SOLN
INTRAMUSCULAR | Status: AC
  Filled 2018-01-06: qty 2

## 2018-01-06 MED ORDER — FENTANYL CITRATE (PF) 100 MCG/2ML IJ SOLN
INTRAMUSCULAR | Status: AC
  Filled 2018-01-06: qty 2

## 2018-01-06 NOTE — Discharge Instructions (Signed)
TEE ° °YOU HAD AN CARDIAC PROCEDURE TODAY: Refer to the procedure report and other information in the discharge instructions given to you for any specific questions about what was found during the examination. If this information does not answer your questions, please call [facility] office at [phone] to clarify.  ° °DIET: Your first meal following the procedure should be a light meal and then it is ok to progress to your normal diet. A half-sandwich or bowl of soup is an example of a good first meal. Heavy or fried foods are harder to digest and may make you feel nauseous or bloated. Drink plenty of fluids but you should avoid alcoholic beverages for 24 hours. If you had a esophageal dilation, please see attached instructions for diet.  ° °ACTIVITY: Your care partner should take you home directly after the procedure. You should plan to take it easy, moving slowly for the rest of the day. You can resume normal activity the day after the procedure however YOU SHOULD NOT DRIVE, use power tools, machinery or perform tasks that involve climbing or major physical exertion for 24 hours (because of the sedation medicines used during the test).  ° °SYMPTOMS TO REPORT IMMEDIATELY: °A cardiologist can be reached at any hour. Please call [phone] for any of the following symptoms:  °Vomiting of blood or coffee ground material  °New, significant abdominal pain  °New, significant chest pain or pain under the shoulder blades  °Painful or persistently difficult swallowing  °New shortness of breath  °Black, tarry-looking or red, bloody stools ° °FOLLOW UP:  °Please also call with any specific questions about appointments or follow up tests. ° ° °Moderate Conscious Sedation, Adult, Care After °These instructions provide you with information about caring for yourself after your procedure. Your health care provider may also give you more specific instructions. Your treatment has been planned according to current medical  practices, but problems sometimes occur. Call your health care provider if you have any problems or questions after your procedure. °What can I expect after the procedure? °After your procedure, it is common: °· To feel sleepy for several hours. °· To feel clumsy and have poor balance for several hours. °· To have poor judgment for several hours. °· To vomit if you eat too soon. ° °Follow these instructions at home: °For at least 24 hours after the procedure: ° °· Do not: °? Participate in activities where you could fall or become injured. °? Drive. °? Use heavy machinery. °? Drink alcohol. °? Take sleeping pills or medicines that cause drowsiness. °? Make important decisions or sign legal documents. °? Take care of children on your own. °· Rest. °Eating and drinking °· Follow the diet recommended by your health care provider. °· If you vomit: °? Drink water, juice, or soup when you can drink without vomiting. °? Make sure you have little or no nausea before eating solid foods. °General instructions °· Have a responsible adult stay with you until you are awake and alert. °· Take over-the-counter and prescription medicines only as told by your health care provider. °· If you smoke, do not smoke without supervision. °· Keep all follow-up visits as told by your health care provider. This is important. °Contact a health care provider if: °· You keep feeling nauseous or you keep vomiting. °· You feel light-headed. °· You develop a rash. °· You have a fever. °Get help right away if: °· You have trouble breathing. °This information is not intended to replace advice   given to you by your health care provider. Make sure you discuss any questions you have with your health care provider. °Document Released: 07/15/2013 Document Revised: 02/27/2016 Document Reviewed: 01/14/2016 °Elsevier Interactive Patient Education © 2018 [employer] ° °

## 2018-01-06 NOTE — H&P (Signed)
Cardiology Admission History and Physical:   Patient ID: Steve [name]; MRN: [id number]; DOB: 1950-10-31   Admission date: 01/06/2018  Primary Care Provider: Binnie Rail, MD Primary Cardiologist: No primary care provider on file. Love Milbourne Primary Electrophysiologist:  n/a  Chief Complaint:  TEE for PFO  Patient Profile:   Here for TEE for cryptogenic stroke  History of Present Illness:   MIHCAEL [name] is a 67 y.o. male with a history of  an unexplained ischemic neurological event in 2016, manifesting primarily as loss of sensation in his right upper extremity with confirmed ischemic stroke on MRI. 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  He has not had any subsequent neurological events on treatment with low-dose aspirin only. ILR has not shown atrial fibrillation in 2 years of monitoring.  The patient specifically denies any chest pain at rest or with exertion, dyspnea at rest or with exertion, orthopnea, paroxysmal nocturnal dyspnea, syncope, palpitations, focal neurological deficits, intermittent claudication, lower extremity edema, unexplained weight gain, cough, hemoptysis or wheezing.  The patient also denies abdominal pain, nausea, vomiting, dysphagia, diarrhea, constipation, polyuria, polydipsia, dysuria, hematuria, frequency, urgency, abnormal bleeding or bruising, fever, chills, unexpected weight changes, mood swings, change in skin or hair texture, change in voice quality, auditory or visual problems, allergic reactions or rashes, new musculoskeletal complaints other than usual "aches and pains".   He is physically active without complaints.  Since implantation almost 2 years ago his device has not shown any atrial fibrillation.  It has only recorded 1 event which was an asymptomatic 3-second pause occurring during sleep in May 2018.  Here to further evaluate the PFO.   Past Medical History:  Diagnosis Date    . Benign hypertrophy of prostate    Dr.Dahlstedt  . Diverticulosis 2013   Moderate   . GERD (gastroesophageal reflux disease)   . Hx of colonic polyp 2008 & 2013   X2  . Hyperlipidemia   . Internal hemorrhoids 2013  . Sleep apnea   . Squamous cell skin cancer    Dr.King [place]  . Stricture esophagus 2013    dilation X 1  . Ulcerative esophagitis 2013    Past Surgical History:  Procedure Laterality Date  . COLONOSCOPY W/ POLYPECTOMY  2013   tubular adenoma  . COLONOSCOPY W/ POLYPECTOMY  2008   hyperplastic  . EP IMPLANTABLE DEVICE N/A 01/03/2016   Procedure: Loop Recorder Insertion;  Surgeon: Sanda Klein, MD;  Location: [facility];  Service: Cardiovascular;  Laterality: N/A;  . MOHS SURGERY     x 3  . NASAL SINUS SURGERY    . ROTATOR CUFF REPAIR    . torn biceps tendon     surgical repair  . uvulectomy & tonsillectomy  2008   Dr Wilburn Cornelia     Medications Prior to Admission: Prior to Admission medications   Medication Sig Start Date End Date Taking? Authorizing Provider  aspirin 81 MG tablet Take 81 mg by mouth daily.     Yes [provider]  omeprazole (PRILOSEC) 40 MG capsule TAKE ONE CAPSULE EACH DAY Patient taking differently: Take 40 mg by mouth daily.  02/06/17  Yes Irene Shipper, MD  Polyethyl Glycol-Propyl Glycol (SYSTANE OP) Place 2 drops into both eyes at bedtime.   Yes [provider]  simvastatin (ZOCOR) 40 MG tablet TAKE ONE TABLET AT BEDTIME Patient taking differently: Take 40 mg by mouth at bedtime 08/26/17  Yes Binnie Rail, MD  ibuprofen (ADVIL,MOTRIN) 200 MG tablet Take 400-600 mg by mouth every 6 (six) hours as needed for headache or moderate pain.    [provider]  vardenafil (LEVITRA) 20 MG tablet Take 20 mg by mouth daily as needed for erectile dysfunction.     [provider]     Allergies:   No Known Allergies  Social History:   Social History   Socioeconomic History  . Marital status:  Married    Spouse name: Not on file  . Number of children: Not on file  . Years of education: Not on file  . Highest education level: Not on file  Occupational History  . 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 Financial resource strain: Not on file  . Food insecurity:    Worry: Not on file    Inability: Not on file  . Transportation needs:    Medical: Not on file    Non-medical: Not on file  Tobacco Use  . Smoking status: Former Smoker    Packs/day: 0.50    Years: 10.00    Pack years: 5.00    Types: Cigarettes    Last attempt to quit: 1983    Years since quitting: 36.2  . Smokeless tobacco: Never Used  . Tobacco comment: smoked 1972-1982, up to 1/2 ppd  Substance and Sexual Activity  . Alcohol use: Yes    Alcohol/week: 3.0 - 3.6 oz    Types: 5 - 6 Standard drinks or equivalent per week    Comment: 3-4 drinks per week  . Drug use: No  . Sexual activity: Not on file  Lifestyle  . Physical activity:    Days per week: Not on file    Minutes per session: Not on file  . Stress: Not on file  Relationships  . Social connections:    Talks on phone: Not on file    Gets together: Not on file    Attends religious service: Not on file    Active member of club or organization: Not on file    Attends meetings of clubs or organizations: Not on file    Relationship status: Not on file  . Intimate partner violence:    Fear of current or ex partner: Not on file    Emotionally abused: Not on file    Physically abused: Not on file    Forced sexual activity: Not on file  Other Topics Concern  . Not on file  Social History Narrative   GETS REG EXERCISE 4x weekly   NO DIET   Lives with wife in a 2 story home.  Has 2 children.     Works as a Insurance underwriter.     Education: college       Family History:   The patient's family history includes Breast cancer in his mother; Diabetes in his daughter; Healthy in his brother; Heart attack in his maternal grandfather;  Hyperlipidemia in his son; Prostate cancer in his father; Stroke in his father and paternal uncle; Uterine cancer in his mother. There is no history of Colon cancer, Rectal cancer, Stomach cancer, or Esophageal cancer.    ROS:  Please see the history of present illness.  All other ROS reviewed and negative.     Physical Exam/Data:   Vitals:   01/06/18 0727  BP: (!) 162/92  Resp: 13  Temp: 98.2 F (36.8 C)  TempSrc: Oral  SpO2: 97%  Weight: 195 lb (88.5  kg)  Height: 6\' 3"  (1.905 m)   No intake or output data in the 24 hours ending 01/06/18 0815 Filed Weights   01/06/18 0727  Weight: 195 lb (88.5 kg)   Body mass index is 24.37 kg/m.  General:  Well nourished, well developed, in no acute distress HEENT: normal Lymph: no adenopathy Neck: no JVD Endocrine:  No thryomegaly Vascular: No carotid bruits; FA pulses 2+ bilaterally without bruits  Cardiac:  normal S1, S2; RRR; no murmur  Lungs:  clear to auscultation bilaterally, no wheezing, rhonchi or rales  Abd: soft, nontender, no hepatomegaly  Ext: no edema Musculoskeletal:  No deformities, BUE and BLE strength normal and equal Skin: warm and dry  Neuro:  CNs 2-12 intact, no focal abnormalities noted Psych:  Normal affect   Relevant CV Studies: 03/14/2015 ECHO  - Left ventricle: The cavity size was normal. Systolic function was   normal. The estimated ejection fraction was in the range of 55%   to 60%. Wall motion was normal; there were no regional wall   motion abnormalities. Left ventricular diastolic function   parameters were normal. - Atrial septum: There was a moderate right-to-left atrial level   shunt, in the baseline state. There was redundancy of the septum,   with borderline criteria for aneurysm.   Laboratory Data:  Chemistry Recent Labs  Lab 12/31/17 0907  NA 138  K 4.7  CL 100  CO2 23  GLUCOSE 99  BUN 13  CREATININE 1.06  CALCIUM 9.1  GFRNONAA 72  GFRAA 84    No results for input(s): PROT,  ALBUMIN, AST, ALT, ALKPHOS, BILITOT in the last 168 hours. Hematology Recent Labs  Lab 12/31/17 0907  WBC 5.0  RBC 4.99  HGB 16.3  HCT 44.8  MCV 90  MCH 32.7  MCHC 36.4*  RDW 12.9  PLT 175   Cardiac EnzymesNo results for input(s): TROPONINI in the last 168 hours. No results for input(s): TROPIPOC in the last 168 hours.  BNPNo results for input(s): BNP, PROBNP in the last 168 hours.  DDimer No results for input(s): DDIMER in the last 168 hours.  Radiology/Studies:  No results found.  Assessment and Plan:   1. This procedure has been fully reviewed with the patient and written informed consent has been obtained..Proceed with TEE for PFO/stroke.   For questions or updates, please contact [place] Please consult [email or web address] for contact info under Cardiology/STEMI.    Signed, Sanda Klein, MD  01/06/2018 8:15 AM

## 2018-01-06 NOTE — Progress Notes (Signed)
  Echocardiogram Echocardiogram Transesophageal has been performed.  Jennette Dubin 01/06/2018, 8:44 AM

## 2018-01-06 NOTE — Op Note (Signed)
INDICATIONS: ischemic stroke, PFO/atrial septal aneurysm  PROCEDURE:   Informed consent was obtained prior to the procedure. The risks, benefits and alternatives for the procedure were discussed and the patient comprehended these risks.  Risks include, but are not limited to, cough, sore throat, vomiting, nausea, somnolence, esophageal and stomach trauma or perforation, bleeding, low blood pressure, aspiration, pneumonia, infection, trauma to the teeth and death.    After a procedural time-out, the oropharynx was anesthetized with 20% benzocaine spray.   During this procedure the patient was administered a total of Versed 4 mg and Fentanyl 50 mcg to achieve and maintain moderate conscious sedation.  The patient's heart rate, blood pressure, and oxygen saturationweare monitored continuously during the procedure. The period of conscious sedation was 15 minutes, of which I was present face-to-face 100% of this time.  The transesophageal probe was inserted in the esophagus and stomach without difficulty and multiple views were obtained.  The patient was kept under observation until the patient left the procedure room.  The patient left the procedure room in stable condition.   Agitated microbubble saline contrast was not administered.  COMPLICATIONS:    There were no immediate complications.  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 Otherwise normal TEE. EF 55%. No signs of right heart volume overload.  RECOMMENDATIONS:     Consider ASD device closure.  Time Spent Directly with the Patient:  45 minutes   Steve [name] 01/06/2018, 8:31 AM

## 2018-01-20 ENCOUNTER — Encounter: Payer: Self-pay | Admitting: Cardiovascular Disease

## 2018-01-20 ENCOUNTER — Ambulatory Visit (INDEPENDENT_AMBULATORY_CARE_PROVIDER_SITE_OTHER): Payer: 59 | Admitting: Cardiovascular Disease

## 2018-01-20 VITALS — BP 132/70 | HR 64 | Ht 75.0 in | Wt 202.0 lb

## 2018-01-20 DIAGNOSIS — Q211 Atrial septal defect: Secondary | ICD-10-CM

## 2018-01-20 DIAGNOSIS — Q2112 Patent foramen ovale: Secondary | ICD-10-CM

## 2018-01-20 NOTE — Patient Instructions (Signed)
Medication Instructions:  Your provider recommends that you continue on your current medications as directed. Please refer to the Current Medication list given to you today.    (If you decide to go through with procedure, we will start you on Plavix 5 days prior).  Labwork: None today  Testing/Procedures: Please talk to your neurologist, Narda Amber, to see if you'd like to proceed with PFO CLOSURE.  Follow-Up: Please let us know what you decide! Potential dates: Monday, May 6th Thursday, May 9th Wednesday, May 15th Friday, May 17th  Any Other Special Instructions Will Be Listed Below (If Applicable).     If you need a refill on your cardiac medications before your next appointment, please call your pharmacy.

## 2018-01-20 NOTE — Progress Notes (Addendum)
Cardiology Office Note Date:  01/20/2018   ID:  Steve [name], DOB 1951/07/27, MRN [id number]  PCP:  Binnie Rail, MD  Cardiologist:  Dr Sallyanne Kuster  Chief Complaint  Patient presents with  . Transient Ischemic Attack    PFO/Cryptogenic stroke     History of Present Illness: Steve [name] is a 67 y.o. male who presents for evaluation of PFO closure, referred by Dr Sallyanne Kuster.   He woke up in 2016 with right arm numbness but 'didn't pay much attention to it.' He also noted that his fine motor skills were abnormal such as his writing. His symptoms fully resolved. He underwent a full outpatient neurologic evaluation including brain MRI, carotid studies, cardiac echo, outpatient event monitor, then implantable loop recorder. He's now worn an outpatient monitor x 18 months and there has been no atrial fibrillation.   The patient is here alone today. He is doing very well. He's physically active with regular exercise and denies exertional symptoms. Today, he denies symptoms of palpitations, chest pain, shortness of breath, orthopnea, PND, lower extremity edema, dizziness, or syncope.  Past Medical History:  Diagnosis Date  . Benign hypertrophy of prostate    Dr.Dahlstedt  . Diverticulosis 2013   Moderate   . GERD (gastroesophageal reflux disease)   . Hx of colonic polyp 2008 & 2013   X2  . Hyperlipidemia   . Internal hemorrhoids 2013  . Sleep apnea   . Squamous cell skin cancer    Dr.King [place]  . Stricture esophagus 2013    dilation X 1  . Ulcerative esophagitis 2013    Past Surgical History:  Procedure Laterality Date  . COLONOSCOPY W/ POLYPECTOMY  2013   tubular adenoma  . COLONOSCOPY W/ POLYPECTOMY  2008   hyperplastic  . EP IMPLANTABLE DEVICE N/A 01/03/2016   Procedure: Loop Recorder Insertion;  Surgeon: Sanda Klein, MD;  Location: [facility];  Service: Cardiovascular;  Laterality: N/A;  . MOHS SURGERY     x 3  . NASAL SINUS SURGERY    . ROTATOR  CUFF REPAIR    . TEE WITHOUT CARDIOVERSION N/A 01/06/2018   Procedure: TRANSESOPHAGEAL ECHOCARDIOGRAM (TEE);  Surgeon: Sanda Klein, MD;  Location: [facility];  Service: Cardiovascular;  Laterality: N/A;  . torn biceps tendon     surgical repair  . uvulectomy & tonsillectomy  2008   Dr Wilburn Cornelia    Current Outpatient Medications  Medication Sig Dispense Refill  . aspirin 81 MG tablet Take 81 mg by mouth daily.      Marland Kitchen ibuprofen (ADVIL,MOTRIN) 200 MG tablet Take 400-600 mg by mouth every 6 (six) hours as needed for headache or moderate pain.    Marland Kitchen omeprazole (PRILOSEC) 40 MG capsule TAKE ONE CAPSULE EACH DAY (Patient taking differently: Take 40 mg by mouth daily. ) 30 capsule 11  . Polyethyl Glycol-Propyl Glycol (SYSTANE OP) Place 2 drops into both eyes at bedtime.    . simvastatin (ZOCOR) 40 MG tablet TAKE ONE TABLET AT BEDTIME (Patient taking differently: Take 40 mg by mouth at bedtime) 90 tablet 3  . vardenafil (LEVITRA) 20 MG tablet Take 20 mg by mouth daily as needed for erectile dysfunction.      No current facility-administered medications for this visit.     Allergies:   Patient has no known allergies.   Social History:  The patient  reports that he quit smoking about 36 years ago. His smoking use included cigarettes. He has a 5.00 pack-year smoking history.  He has never used smokeless tobacco. He reports that he drinks about 3.0 - 3.6 oz of alcohol per week. He reports that he does not use drugs.   Family History:  The patient's family history includes Breast cancer in his mother; Diabetes in his daughter; Healthy in his brother; Heart attack in his maternal grandfather; Hyperlipidemia in his son; Prostate cancer in his father; Stroke in his father and paternal uncle; Uterine cancer in his mother.    ROS:  Please see the history of present illness.  All other systems are reviewed and negative.    PHYSICAL EXAM: VS:  BP 132/70   Pulse 64   Ht 6\' 3"  (1.905 m)   Wt 202 lb  (91.6 kg)   SpO2 97%   BMI 25.25 kg/m  , BMI Body mass index is 25.25 kg/m. GEN: Well nourished, well developed, in no acute distress  HEENT: normal  Neck: no JVD, no masses. No carotid bruits Cardiac: RRR without murmur or gallop Respiratory:  clear to auscultation bilaterally, normal work of breathing GI: soft, nontender, nondistended, + BS MS: no deformity or atrophy  Ext: no pretibial edema, pedal pulses 2+= bilaterally Skin: warm and dry, no rash Neuro:  Strength and sensation are intact Psych: euthymic mood, full affect  EKG:  EKG is not ordered today.  Recent Labs: 08/06/2017: ALT 35; Magnesium 2.5; TSH 2.43 12/31/2017: BUN 13; Creatinine, Ser 1.06; Hemoglobin 16.3; Platelets 175; Potassium 4.7; Sodium 138   Lipid Panel     Component Value Date/Time   CHOL 188 08/06/2017 1619   CHOL 183 02/07/2015 1535   TRIG 76.0 08/06/2017 1619   TRIG 81 02/07/2015 1535   HDL 63.20 08/06/2017 1619   HDL 63 02/07/2015 1535   CHOLHDL 3 08/06/2017 1619   VLDL 15.2 08/06/2017 1619   LDLCALC 110 (H) 08/06/2017 1619   LDLCALC 104 (H) 02/07/2015 1535   LDLDIRECT 172.0 07/24/2007 0000      Wt Readings from Last 3 Encounters:  01/20/18 202 lb (91.6 kg)  01/06/18 195 lb (88.5 kg)  12/04/17 197 lb (89.4 kg)     Cardiac Studies Reviewed: TEE: Study Conclusions  - Left ventricle: The cavity size was normal. Wall thickness was   normal. Systolic function was normal. The estimated ejection   fraction was in the range of 55% to 60%. Wall motion was normal;   there were no regional wall motion abnormalities. - Left atrium: No evidence of thrombus in the atrial cavity or   appendage. - Right atrium: No evidence of thrombus in the atrial cavity or   appendage. - Atrial septum: There was a small secundum atrial septal defect.   Doppler showed a small bidirectional atrial level shunt, in the   baseline state. 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  Echo 03/14/2015: Study Conclusions  - Left ventricle: The cavity size was normal. Systolic function was   normal. The estimated ejection fraction was in the range of 55%   to 60%. Wall motion was normal; there were no regional wall   motion abnormalities. Left ventricular diastolic function   parameters were normal. - Atrial septum: There was a moderate right-to-left atrial level   shunt, in the baseline state. There was redundancy of the septum,   with borderline criteria for aneurysm.  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   The patient is counseled about the association of PFO and cryptogenic stroke. Available clinical trial data is reviewed, specifically those trials comparing transcatheter PFO closure and medical therapy with antiplatelet drugs. The patient understands the potential benefit of PFO closure with respect to secondary stroke reduction compared with medical therapy alone. Specific risks of transcatheter PFO closure are reviewed with the patient. These risks include bleeding, infection, device embolization, stroke, cardiac perforation, tamponade, arrhythmia, MI, and late device erosion. He understands these serious risks occur at low incidence of < 1%. We discussed the risk of atrial fibrillation may be as high as 3% but that risk declines with time.   Factors in favor of PFO device closure are the anatomic characteristics of the PFO as it is relatively large and associated with atrial septal aneurysm. The patient understands his age and time removed from his stroke place him outside of the clinical trial data we discussed today. However, he feels  that he is very healthy for his age and I agree his expected longevity is good. Much of the benefit with respect to stroke risk reduction after PFO closure has been over an extended period of time and has been more robust in patient's with similar PFO anatomy (large defect with atrial septal aneurysm). After our discussion, he would like to consider his options further. He likely will pursue PFO closure. I offered him a referral back to Dr Posey Pronto with neurology who I thought could give him another perspective on his treatment options. He will be back in touch if he decides to pursue PFO closure. He understands we would treat him with ASA long term, plavix for 3 months, and he would follow SBE prophylaxis for 6 months following device closure.   Current medicines are reviewed with the patient today.  The patient does not have concerns regarding medicines.  Labs/ tests ordered today include:  No orders of the defined types were placed in this encounter.   Disposition:   Neurology referral to Dr Olene Craven, Sherren Mocha, MD  01/20/2018 3:39 PM    [address], [place], [place]  [address] Phone: [phone]; Fax: [phone]

## 2018-01-22 NOTE — Progress Notes (Signed)
Thanks, Ronalee Belts! MC(r)

## 2018-01-23 ENCOUNTER — Telehealth: Payer: Self-pay | Admitting: Cardiovascular Disease

## 2018-01-23 ENCOUNTER — Telehealth: Payer: Self-pay | Admitting: Neurology

## 2018-01-23 DIAGNOSIS — Q211 Atrial septal defect: Secondary | ICD-10-CM

## 2018-01-23 DIAGNOSIS — Q2112 Patent foramen ovale: Secondary | ICD-10-CM

## 2018-01-23 NOTE — Telephone Encounter (Signed)
New Message   Pt calling to speak the nurse regarding some procedure dates. Please call

## 2018-01-23 NOTE — Telephone Encounter (Signed)
Patient requests to have PFO closure tentatively scheduled June 20. He will come June 12 for lab work, to pick up instruction letter, and to pick up prescription for Plavix.   He awaits to hear from Neurology regarding procedure.

## 2018-01-23 NOTE — Telephone Encounter (Signed)
Patient is having a heart procedure done and would like to see Dr. Posey Pronto to discuss this.  Informed him that he would have to be seen.

## 2018-01-23 NOTE — Telephone Encounter (Signed)
Please add to my schedule on 4/24 at 11:30am for f/u visit.   Steve [name] K. Posey Pronto, DO

## 2018-01-23 NOTE — Telephone Encounter (Signed)
Patient wants to speak to someone about a procedure that he may have done

## 2018-01-23 NOTE — Telephone Encounter (Signed)
Left message to call back  

## 2018-01-27 ENCOUNTER — Encounter: Payer: Self-pay | Admitting: Neurology

## 2018-01-27 NOTE — Telephone Encounter (Signed)
Appointment confirmed by [facility].

## 2018-01-29 ENCOUNTER — Ambulatory Visit: Payer: 59 | Admitting: Neurology

## 2018-01-30 ENCOUNTER — Ambulatory Visit (INDEPENDENT_AMBULATORY_CARE_PROVIDER_SITE_OTHER): Payer: 59 | Admitting: *Deleted

## 2018-01-30 DIAGNOSIS — I639 Cerebral infarction, unspecified: Secondary | ICD-10-CM | POA: Diagnosis not present

## 2018-01-31 NOTE — Progress Notes (Signed)
[facility] Summary Report / Loop Recorder 

## 2018-02-05 ENCOUNTER — Encounter: Payer: Self-pay | Admitting: Neurology

## 2018-02-05 ENCOUNTER — Ambulatory Visit (INDEPENDENT_AMBULATORY_CARE_PROVIDER_SITE_OTHER): Payer: 59 | Admitting: Neurology

## 2018-02-05 VITALS — BP 120/84 | HR 78 | Ht 75.0 in | Wt 201.0 lb

## 2018-02-05 DIAGNOSIS — Z8673 Personal history of transient ischemic attack (TIA), and cerebral infarction without residual deficits: Secondary | ICD-10-CM

## 2018-02-05 DIAGNOSIS — Q2112 Patent foramen ovale: Secondary | ICD-10-CM

## 2018-02-05 DIAGNOSIS — Q211 Atrial septal defect: Secondary | ICD-10-CM

## 2018-02-05 DIAGNOSIS — I253 Aneurysm of heart: Secondary | ICD-10-CM

## 2018-02-05 LAB — CUP PACEART REMOTE DEVICE CHECK
Date Time Interrogation Session: 20190323113647
Implantable Pulse Generator Implant Date: 20170328

## 2018-02-05 NOTE — Progress Notes (Signed)
Follow-up Visit   Date: 02/05/18    Steve [name] MRN: [id number] DOB: 09/17/1951   Interim History: Steve [name] is a 67 y.o. right-handed Caucasian male with right hemifacial spasm, hyperlipidemia, BPH, GERD, and left semiovale stroke (2016, manifesting with right arm weakness and facial numbness) returning to the clinic for follow-up of stroke. The patient was accompanied to the clinic by self.  History of present illness: On April 23rd, he woke up with right arm heaviness, he was able to lift his arm. When he went to brush his teeth, he noticed that there was difficulty with applying pressure on his toothbrush. Moments later, he developed right cheek and torso tingling sensation. There was no weakness of the right lower extremity or the face. Denies any trouble swallowing, talking, or drooling. Symptoms resolved within about 6 hours. He has noticed that his signature and hand writing is showing subtle differences. He injured his right rotator cuff in March and thought symptoms could be related to that. No associated slurred speech. He stopped taking aspirin several years ago and restarted aspirin 81mg  when these symptoms started.   He has seen Dr. Erling Cruz in the late 1990s for hemifacial spasm and gets botox injected at [facility] every three months.  UPDATE 03/24/2014:  Patient is here to discuss the results of his testing which showed a small left centrum semiovale stroke.  There is no evidence of large vessel occlusion. Echocardiogram is normal except moderate right-to-left shunt without atrial septal aneurysm. He has no new complaints, and in fact reports fine motor movements have improved. He is here with multiple questions and concerns regarding ongoing workup.  UPDATE 02/05/2018:  Steve [name] is is here for discuss PFO closure after his recent TEE showed ASD and artrial septal aneurysm.  He has not had any interval neurological spells.  Implantable loop recorder has  not showed any cardiac arrhythmia.  He is otherwise doing great.    Medications:  Current Outpatient Medications on File Prior to Visit  Medication Sig Dispense Refill  . aspirin 81 MG tablet Take 81 mg by mouth daily.      Marland Kitchen ibuprofen (ADVIL,MOTRIN) 200 MG tablet Take 400-600 mg by mouth every 6 (six) hours as needed for headache or moderate pain.    Marland Kitchen omeprazole (PRILOSEC) 40 MG capsule TAKE ONE CAPSULE EACH DAY (Patient taking differently: Take 40 mg by mouth daily. ) 30 capsule 11  . Polyethyl Glycol-Propyl Glycol (SYSTANE OP) Place 2 drops into both eyes at bedtime.    . simvastatin (ZOCOR) 40 MG tablet Take 40 mg by mouth daily.    . vardenafil (LEVITRA) 20 MG tablet Take 20 mg by mouth daily as needed for erectile dysfunction.      No current facility-administered medications on file prior to visit.     Allergies: No Known Allergies  Review of Systems:  CONSTITUTIONAL: No fevers, chills, night sweats, or weight loss.  EYES: No visual changes or eye pain ENT: No hearing changes.  No history of nose bleeds.   RESPIRATORY: No cough, wheezing and shortness of breath.   CARDIOVASCULAR: Negative for chest pain, and palpitations.   GI: Negative for abdominal discomfort, blood in stools or black stools.  No recent change in bowel habits.   GU:  No history of incontinence.   MUSCLOSKELETAL: No history of joint pain or swelling.  No myalgias.   SKIN: Negative for lesions, rash, and itching.   ENDOCRINE: Negative for cold or heat intolerance, polydipsia  or goiter.   PSYCH:  No depression or anxiety symptoms.   NEURO: As Above.   Vital Signs:  BP 120/84   Pulse 78   Ht 6\' 3"  (1.905 m)   Wt 201 lb (91.2 kg)   SpO2 94%   BMI 25.12 kg/m    General Medical Exam:   General:  Well appearing, comfortable  Eyes/ENT: see cranial nerve examination.   Neck: No masses appreciated.  Full range of motion without tenderness.  No carotid bruits. Respiratory:  Clear to auscultation, good air  entry bilaterally.   Cardiac:  Regular rate and rhythm, no murmur.   Ext:  No edema  Neurological Exam: MENTAL STATUS including orientation to time, place, person, recent and remote memory, attention span and concentration, language, and fund of knowledge is normal.  Speech is not dysarthric.  CRANIAL NERVES:  Pupils equal round and reactive to light.  Normal conjugate, extra-ocular eye movements in all directions of gaze. Right facial asymmetry involving the forehead and cheek, there is trace wrinkling of the frontalis muscle and the right, weakness of the buccinator, asymmetrical smile on the right (chemodenervation due to Botox).  Moderate right ptosis.  Motor movements are intact on the left. There is intermittent right hemifacial spasm.  Palate elevates symmetrically.  Tongue is midline.  MOTOR:  Motor strength is 5/5 in all extremities.  No pronator drift.  Tone is normal.    MSRs:  Reflexes are 2+/4 throughout.  COORDINATION/GAIT:  Normal finger-to- nose-finger.  Intact rapid alternating movements bilaterally.  Gait narrow based and stable.   Data: MRI/A brain and neck 03/18/2015: 1. Mild white matter signal changes in the left posterior centrum semiovale with subtle diffusion signal changes suggestive of subacute white matter infarct. No mass effect or hemorrhage. 2. Otherwise normal for age noncontrast MRI appearance of the brain. 3. Left greater than right carotid bifurcation atherosclerosis with no hemodynamically significant stenosis in the neck. 4. Negative intracranial MRA.  Lab Results  Component Value Date   CHOL 188 08/06/2017   HDL 63.20 08/06/2017   LDLCALC 110 (H) 08/06/2017   LDLDIRECT 172.0 07/24/2007   TRIG 76.0 08/06/2017   CHOLHDL 3 08/06/2017   Lab Results  Component Value Date   HGBA1C 5.5 03/09/2016   Lab Results  Component Value Date   TSH 2.43 08/06/2017   Echocardiogram 03/14/2015: - Left ventricle: The cavity size was normal. Systolic function  was normal. The estimated ejection fraction was in the range of 55%to 60%. Wall motion was normal; there were no regional wall motion abnormalities. Left ventricular diastolic function parameters were normal. - Atrial septum: There was a moderate right-to-left atrial level shunt, in the baseline state. There was redundancy of the septum, with borderline criteria for aneurysm.  TEE 01/13/2018: - Left ventricle: The cavity size was normal. Wall thickness was normal. Systolic function was normal. The estimated ejection   fraction was in the range of 55% to 60%. Wall motion was normal; there were no regional wall motion abnormalities.  - Left atrium: No evidence of thrombus in the atrial cavity or appendage. - Right atrium: No evidence of thrombus in the atrial cavity or appendage. - Atrial septum: There was a small secundum atrial septal defect. Doppler showed a small bidirectional atrial level shunt, in the   baseline state. 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   IMPRESSION/PLAN: 1.  History of small left posterior  centrum semiovale stroke manifesting with transient R arm weakness and hemisensory loss in April 2016.  There was no evidence of large vessel disease and long term cardiac monitoring has not detected cardiac arrhythmia. Recent TEE found a atrial septal defect and large atrial septal aneurysm which certainly places his at risk for paradoxical emboli and suggests that his stroke in 2016 was most likely due to paradoxical emboli via PFO.  We discussed recent clinical trials which have shown benefit with PFO closure.  I agree that he would be a good candidate for PFO closure to minimize future risk of stroke which he is interested in pursuing. Procedure date is set for 6/20. Continue aspirin 81mg  daily and zocor 40mg  daily.  BP is well-controlled.  No history of diabetes.  2.   Right hemifacial spasm, followed at [facility] and managed with Botox injections. He does have right facial weakness evident today, with his last injection which will slowly improve as the medication wears off.   He had many questions regarding PFO closure as well as management options for right hemifacial spasm which were answered to the best of my ability.   Return to clinic in 4 months   Greater than 50% of this 30 minute visit was spent in counseling, explanation of diagnosis, planning of further management, and coordination of care.    Thank you for allowing me to participate in patient's care.  If I can answer any additional questions, I would be pleased to do so.    Sincerely,    Blaize Nipper K. Posey Pronto, DO

## 2018-02-05 NOTE — Patient Instructions (Signed)
Great seeing you today!  Best wishes with your upcoming procedure.  If there is anything I can do for you, please call my office  Return to clinic in 4 months

## 2018-02-18 ENCOUNTER — Telehealth: Payer: Self-pay

## 2018-02-18 MED ORDER — CLOPIDOGREL BISULFATE 75 MG PO TABS: 75.0000 mg | ORAL_TABLET | Freq: Every day | ORAL | 3 refills | Status: DC

## 2018-02-18 NOTE — Addendum Note (Signed)
Addended by: Harland German A on: 02/18/2018 08:14 AM   Modules accepted: Orders

## 2018-02-18 NOTE — Telephone Encounter (Signed)
Probably best to switch him to pantoprazole 40 mg daily. thx

## 2018-02-18 NOTE — Telephone Encounter (Signed)
Patient has been scheduled 6/20 for PFO closure. He will come in for labs and to pick up instruction letter 6/12.  He will also pick up Rx for Plavix at that time and start the day after. The patient is also on omeprazole.  To Dr. Burt Knack for PPI instructions.

## 2018-02-24 ENCOUNTER — Other Ambulatory Visit: Payer: Self-pay | Admitting: Internal Medicine

## 2018-02-25 LAB — CUP PACEART REMOTE DEVICE CHECK
Date Time Interrogation Session: 20190425120805
Implantable Pulse Generator Implant Date: 20170328

## 2018-03-04 ENCOUNTER — Ambulatory Visit (INDEPENDENT_AMBULATORY_CARE_PROVIDER_SITE_OTHER): Payer: 59 | Admitting: *Deleted

## 2018-03-04 DIAGNOSIS — I639 Cerebral infarction, unspecified: Secondary | ICD-10-CM | POA: Diagnosis not present

## 2018-03-04 NOTE — Progress Notes (Signed)
[facility] Summary Report / Loop Recorder 

## 2018-03-07 MED ORDER — PANTOPRAZOLE SODIUM 40 MG PO TBEC: 40.0000 mg | DELAYED_RELEASE_TABLET | Freq: Every day | ORAL | 11 refills | Status: DC

## 2018-03-07 MED ORDER — CLOPIDOGREL BISULFATE 75 MG PO TABS: 75.0000 mg | ORAL_TABLET | Freq: Every day | ORAL | 11 refills | Status: DC

## 2018-03-07 NOTE — Telephone Encounter (Addendum)
Pt is called following up on these medication.  Pt is trying to pick his medications up while he is out 1:30p.  Pt would like to speak to Nurse if possible.

## 2018-03-07 NOTE — Telephone Encounter (Signed)
Patient understands he does not need to start Plavix until about 1 week prior to closure. He understands at that time, he will STOP PRILOSEC and START PROTONIX 40 mg daily. Prescriptions called in per patient request. He will come 6/12 for labs. He was grateful for call.

## 2018-03-19 ENCOUNTER — Other Ambulatory Visit: Payer: 59 | Admitting: *Deleted

## 2018-03-19 DIAGNOSIS — Q2112 Patent foramen ovale: Secondary | ICD-10-CM

## 2018-03-19 DIAGNOSIS — Q211 Atrial septal defect: Secondary | ICD-10-CM

## 2018-03-19 LAB — CBC WITH DIFFERENTIAL/PLATELET
Basophils Absolute: 0 10*3/uL (ref 0.0–0.2)
Basos: 0 %
EOS (ABSOLUTE): 0.1 10*3/uL (ref 0.0–0.4)
Eos: 2 %
Hematocrit: 45.2 % (ref 37.5–51.0)
Hemoglobin: 15.8 g/dL (ref 13.0–17.7)
Immature Grans (Abs): 0 10*3/uL (ref 0.0–0.1)
Immature Granulocytes: 0 %
Lymphocytes Absolute: 1.6 10*3/uL (ref 0.7–3.1)
Lymphs: 30 %
MCH: 32 pg (ref 26.6–33.0)
MCHC: 35 g/dL (ref 31.5–35.7)
MCV: 92 fL (ref 79–97)
Monocytes Absolute: 0.5 10*3/uL (ref 0.1–0.9)
Monocytes: 10 %
Neutrophils Absolute: 3.2 10*3/uL (ref 1.4–7.0)
Neutrophils: 58 %
Platelets: 194 10*3/uL (ref 150–450)
RBC: 4.94 x10E6/uL (ref 4.14–5.80)
RDW: 13.1 % (ref 12.3–15.4)
WBC: 5.5 10*3/uL (ref 3.4–10.8)

## 2018-03-19 LAB — BASIC METABOLIC PANEL
BUN/Creatinine Ratio: 18 (ref 10–24)
BUN: 15 mg/dL (ref 8–27)
CO2: 22 mmol/L (ref 20–29)
Calcium: 8.9 mg/dL (ref 8.6–10.2)
Chloride: 101 mmol/L (ref 96–106)
Creatinine, Ser: 0.84 mg/dL (ref 0.76–1.27)
GFR calc Af Amer: 104 mL/min/{1.73_m2} (ref >59)
GFR calc non Af Amer: 90 mL/min/{1.73_m2} (ref >59)
Glucose: 93 mg/dL (ref 65–99)
Potassium: 4.5 mmol/L (ref 3.5–5.2)
Sodium: 137 mmol/L (ref 134–144)

## 2018-03-19 NOTE — Telephone Encounter (Signed)
Left message to call back Friday morning to review PFO closure instructions and to schedule 1 mo follow-up.

## 2018-03-21 NOTE — Telephone Encounter (Signed)
Reviewed PFO closure instructions with patient and he has no further questions.  Scheduled him for one month follow-up appointment 7/10 with Nell Range, PA. He was grateful for call and agrees with treatment plan.

## 2018-03-25 ENCOUNTER — Telehealth: Payer: Self-pay | Admitting: *Deleted

## 2018-03-25 NOTE — Telephone Encounter (Addendum)
Pt contacted pre-PFO closure scheduled at [facility] for: Thursday March 27, 2018 7:30 AM Verify arrival time and place: [facility] Main Entrance A at:5:30 AM  No solid food after midnight prior to cath, clear liquids until 5 AM day of procedure. Verify no known allergies. Verify no diabetes medications.  Hold: Levitra until post procedure.   AM meds can be  taken pre-cath with sip of water including: ASA 81 mg Clopidogrel 75 mg  Confirm patient has responsible person to drive home post procedure and observe patient for 24 hours: yes  I discussed instructions with patient, he verbalized understanding, thanked me for call.

## 2018-03-26 LAB — CUP PACEART REMOTE DEVICE CHECK
Date Time Interrogation Session: 20190528123559
Implantable Pulse Generator Implant Date: 20170328

## 2018-03-27 ENCOUNTER — Ambulatory Visit (HOSPITAL_BASED_OUTPATIENT_CLINIC_OR_DEPARTMENT_OTHER): Payer: 59

## 2018-03-27 ENCOUNTER — Ambulatory Visit (HOSPITAL_COMMUNITY)
Admission: RE | Admit: 2018-03-27 | Discharge: 2018-03-27 | Disposition: A | Discharge status: Home / Self Care | Payer: 59 | Source: Ambulatory Visit | Attending: Cardiovascular Disease | Admitting: Cardiovascular Disease

## 2018-03-27 ENCOUNTER — Ambulatory Visit (HOSPITAL_COMMUNITY)
Admission: RE | Disposition: A | Discharge status: Home / Self Care | Payer: Self-pay | Source: Ambulatory Visit | Attending: Cardiovascular Disease

## 2018-03-27 ENCOUNTER — Encounter (HOSPITAL_COMMUNITY): Payer: Self-pay | Admitting: Cardiovascular Disease

## 2018-03-27 DIAGNOSIS — Z8249 Family history of ischemic heart disease and other diseases of the circulatory system: Secondary | ICD-10-CM | POA: Diagnosis not present

## 2018-03-27 DIAGNOSIS — Q211 Atrial septal defect: Secondary | ICD-10-CM

## 2018-03-27 DIAGNOSIS — I253 Aneurysm of heart: Secondary | ICD-10-CM

## 2018-03-27 DIAGNOSIS — Z87891 Personal history of nicotine dependence: Secondary | ICD-10-CM | POA: Diagnosis not present

## 2018-03-27 DIAGNOSIS — N400 Enlarged prostate without lower urinary tract symptoms: Secondary | ICD-10-CM | POA: Insufficient documentation

## 2018-03-27 DIAGNOSIS — Z7982 Long term (current) use of aspirin: Secondary | ICD-10-CM | POA: Diagnosis not present

## 2018-03-27 DIAGNOSIS — G4730 Sleep apnea, unspecified: Secondary | ICD-10-CM | POA: Insufficient documentation

## 2018-03-27 DIAGNOSIS — Z8673 Personal history of transient ischemic attack (TIA), and cerebral infarction without residual deficits: Secondary | ICD-10-CM | POA: Insufficient documentation

## 2018-03-27 DIAGNOSIS — K219 Gastro-esophageal reflux disease without esophagitis: Secondary | ICD-10-CM | POA: Diagnosis not present

## 2018-03-27 DIAGNOSIS — E785 Hyperlipidemia, unspecified: Secondary | ICD-10-CM | POA: Diagnosis not present

## 2018-03-27 DIAGNOSIS — Q2112 Patent foramen ovale: Secondary | ICD-10-CM

## 2018-03-27 HISTORY — PX: PATENT FORAMEN OVALE(PFO) CLOSURE: CATH118300

## 2018-03-27 LAB — POCT ACTIVATED CLOTTING TIME
Activated Clotting Time: 230 seconds
Activated Clotting Time: 235 seconds
Activated Clotting Time: 186 seconds

## 2018-03-27 LAB — ECHOCARDIOGRAM LIMITED
Height: 74 in
Weight: 3120 oz

## 2018-03-27 SURGERY — PATENT FORAMEN OVALE (PFO) CLOSURE
Anesthesia: LOCAL

## 2018-03-27 MED ORDER — SODIUM CHLORIDE 0.9% FLUSH: 3.0000 mL | INTRAVENOUS | Status: DC | PRN

## 2018-03-27 MED ORDER — CEFAZOLIN SODIUM-DEXTROSE 2-4 GM/100ML-% IV SOLN
INTRAVENOUS | Status: AC
  Filled 2018-03-27: qty 100

## 2018-03-27 MED ORDER — FENTANYL CITRATE (PF) 100 MCG/2ML IJ SOLN
INTRAMUSCULAR | Status: AC
  Filled 2018-03-27: qty 2

## 2018-03-27 MED ORDER — HEPARIN SODIUM (PORCINE) 1000 UNIT/ML IJ SOLN
INTRAMUSCULAR | Status: DC | PRN
  Administered 2018-03-27: 7000 [IU] via INTRAVENOUS
  Administered 2018-03-27: 2000 [IU] via INTRAVENOUS

## 2018-03-27 MED ORDER — CLOPIDOGREL BISULFATE 75 MG PO TABS: 75.0000 mg | ORAL_TABLET | ORAL | Status: DC

## 2018-03-27 MED ORDER — ONDANSETRON HCL 4 MG/2ML IJ SOLN: 4.0000 mg | Freq: Four times a day (QID) | INTRAMUSCULAR | Status: DC | PRN

## 2018-03-27 MED ORDER — ASPIRIN 81 MG PO CHEW: 81.0000 mg | CHEWABLE_TABLET | ORAL | Status: DC

## 2018-03-27 MED ORDER — SODIUM CHLORIDE 0.9 % WEIGHT BASED INFUSION: 1.0000 mL/kg/h | INTRAVENOUS | Status: DC

## 2018-03-27 MED ORDER — MIDAZOLAM HCL 2 MG/2ML IJ SOLN
INTRAMUSCULAR | Status: DC | PRN
  Administered 2018-03-27: 2 mg via INTRAVENOUS

## 2018-03-27 MED ORDER — FENTANYL CITRATE (PF) 100 MCG/2ML IJ SOLN
INTRAMUSCULAR | Status: DC | PRN
  Administered 2018-03-27: 25 ug via INTRAVENOUS

## 2018-03-27 MED ORDER — SODIUM CHLORIDE 0.9 % IV SOLN
250.0000 mL | INTRAVENOUS | Status: DC | PRN
Start: 2018-03-27 — End: 2018-03-27

## 2018-03-27 MED ORDER — LIDOCAINE HCL (PF) 1 % IJ SOLN
INTRAMUSCULAR | Status: DC | PRN
  Administered 2018-03-27: 20 mL

## 2018-03-27 MED ORDER — SODIUM CHLORIDE 0.9 % IV SOLN: 250.0000 mL | INTRAVENOUS | Status: DC | PRN

## 2018-03-27 MED ORDER — MIDAZOLAM HCL 2 MG/2ML IJ SOLN
INTRAMUSCULAR | Status: AC
  Filled 2018-03-27: qty 2

## 2018-03-27 MED ORDER — CEFAZOLIN SODIUM-DEXTROSE 2-4 GM/100ML-% IV SOLN
2.0000 g | INTRAVENOUS | Status: AC
  Administered 2018-03-27: 2 g via INTRAVENOUS

## 2018-03-27 MED ORDER — SODIUM CHLORIDE 0.9% FLUSH: 3.0000 mL | Freq: Two times a day (BID) | INTRAVENOUS | Status: DC

## 2018-03-27 MED ORDER — ACETAMINOPHEN 325 MG PO TABS: 650.0000 mg | ORAL_TABLET | ORAL | Status: DC | PRN

## 2018-03-27 MED ORDER — LIDOCAINE HCL (PF) 1 % IJ SOLN
INTRAMUSCULAR | Status: AC
  Filled 2018-03-27: qty 30

## 2018-03-27 MED ORDER — HEPARIN (PORCINE) IN NACL 1000-0.9 UT/500ML-% IV SOLN
INTRAVENOUS | Status: AC
  Filled 2018-03-27: qty 1000

## 2018-03-27 MED ORDER — SODIUM CHLORIDE 0.9 % WEIGHT BASED INFUSION
3.0000 mL/kg/h | INTRAVENOUS | Status: AC
  Administered 2018-03-27: 3 mL/kg/h via INTRAVENOUS

## 2018-03-27 MED ORDER — HEPARIN SODIUM (PORCINE) 1000 UNIT/ML IJ SOLN
INTRAMUSCULAR | Status: AC
  Filled 2018-03-27: qty 1

## 2018-03-27 SURGICAL SUPPLY — 15 items
CATH ACUNAV REPROCESSED (CATHETERS) ×1 IMPLANT
CATH INFINITI 5 FR MPA2 (CATHETERS) ×1 IMPLANT
COVER PRB 48X5XTLSCP FOLD TPE (BAG) IMPLANT
COVER PROBE 5X48 (BAG) ×2
COVER SWIFTLINK CONNECTOR (BAG) ×1 IMPLANT
GUIDEWIRE AMPLATZER 1.5JX260 (WIRE) ×1 IMPLANT
GUIDEWIRE ANGLED .035X150CM (WIRE) ×1 IMPLANT
OCCLUDER AMPLATZER PFO 25MM (Prosthesis & Implant Heart) ×1 IMPLANT
PACK CARDIAC CATHETERIZATION (CUSTOM PROCEDURE TRAY) ×2 IMPLANT
PROTECTION STATION PRESSURIZED (MISCELLANEOUS) ×2
SHEATH INTROD W/O MIN 9FR 25CM (SHEATH) ×1 IMPLANT
SHEATH PINNACLE 8F 10CM (SHEATH) ×1 IMPLANT
STATION PROTECTION PRESSURIZED (MISCELLANEOUS) IMPLANT
SYSTEM DELIVERY AMPLATZER 8FR (SHEATH) ×1 IMPLANT
WIRE EMERALD 3MM-J .035X150CM (WIRE) ×1 IMPLANT

## 2018-03-27 NOTE — Progress Notes (Signed)
  Echocardiogram 2D Echocardiogram limited S/P PFO closure has been performed.  Steve [name] M 03/27/2018, 1:05 PM

## 2018-03-27 NOTE — Progress Notes (Signed)
Excellent. Thank you! MCr

## 2018-03-27 NOTE — Discharge Instructions (Signed)

## 2018-03-27 NOTE — Progress Notes (Signed)
Site area: rt groin fv sheaths x2 Site Prior to Removal:  Level 0 Pressure Applied For: 20 minutes Manual:   yes Patient Status During Pull:  stable Post Pull Site:  Level 0 Post Pull Instructions Given:  yes Post Pull Pulses Present: palpable rt pt Dressing Applied:  Gauze and tegaderm Bedrest begins @ [id number] Comments:

## 2018-03-27 NOTE — H&P (Signed)
Cardiology Office Note Date:  01/20/2018   ID:  Steve [name], DOB 08-Aug-1951, MRN [id number]  PCP:  Binnie Rail, MD       Cardiologist:  Dr Sallyanne Kuster      Chief Complaint  Patient presents with  . Transient Ischemic Attack    PFO/Cryptogenic stroke     History of Present Illness: Steve [name] is a 67 y.o. male who presents for evaluation of PFO closure, referred by Dr Sallyanne Kuster.   He woke up in 2016 with right arm numbness but 'didn't pay much attention to it.' He also noted that his fine motor skills were abnormal such as his writing. His symptoms fully resolved. He underwent a full outpatient neurologic evaluation including brain MRI, carotid studies, cardiac echo, outpatient event monitor, then implantable loop recorder. He's now worn an outpatient monitor x 18 months and there has been no atrial fibrillation.   The patient is here alone today. He is doing very well. He's physically active with regular exercise and denies exertional symptoms. Today, he denies symptoms of palpitations, chest pain, shortness of breath, orthopnea, PND, lower extremity edema, dizziness, or syncope.      Past Medical History:  Diagnosis Date  . Benign hypertrophy of prostate    Dr.Dahlstedt  . Diverticulosis 2013   Moderate   . GERD (gastroesophageal reflux disease)   . Hx of colonic polyp 2008 & 2013   X2  . Hyperlipidemia   . Internal hemorrhoids 2013  . Sleep apnea   . Squamous cell skin cancer    Dr.King [place]  . Stricture esophagus 2013    dilation X 1  . Ulcerative esophagitis 2013         Past Surgical History:  Procedure Laterality Date  . COLONOSCOPY W/ POLYPECTOMY  2013   tubular adenoma  . COLONOSCOPY W/ POLYPECTOMY  2008   hyperplastic  . EP IMPLANTABLE DEVICE N/A 01/03/2016   Procedure: Loop Recorder Insertion;  Surgeon: Sanda Klein, MD;  Location: [facility];  Service: Cardiovascular;  Laterality: N/A;  . MOHS SURGERY      x 3  . NASAL SINUS SURGERY    . ROTATOR CUFF REPAIR    . TEE WITHOUT CARDIOVERSION N/A 01/06/2018   Procedure: TRANSESOPHAGEAL ECHOCARDIOGRAM (TEE);  Surgeon: Sanda Klein, MD;  Location: [facility];  Service: Cardiovascular;  Laterality: N/A;  . torn biceps tendon     surgical repair  . uvulectomy & tonsillectomy  2008   Dr Wilburn Cornelia          Current Outpatient Medications  Medication Sig Dispense Refill  . aspirin 81 MG tablet Take 81 mg by mouth daily.      Marland Kitchen ibuprofen (ADVIL,MOTRIN) 200 MG tablet Take 400-600 mg by mouth every 6 (six) hours as needed for headache or moderate pain.    Marland Kitchen omeprazole (PRILOSEC) 40 MG capsule TAKE ONE CAPSULE EACH DAY (Patient taking differently: Take 40 mg by mouth daily. ) 30 capsule 11  . Polyethyl Glycol-Propyl Glycol (SYSTANE OP) Place 2 drops into both eyes at bedtime.    . simvastatin (ZOCOR) 40 MG tablet TAKE ONE TABLET AT BEDTIME (Patient taking differently: Take 40 mg by mouth at bedtime) 90 tablet 3  . vardenafil (LEVITRA) 20 MG tablet Take 20 mg by mouth daily as needed for erectile dysfunction.      No current facility-administered medications for this visit.     Allergies:   Patient has no known allergies.   Social History:  The  patient  reports that he quit smoking about 36 years ago. His smoking use included cigarettes. He has a 5.00 pack-year smoking history. He has never used smokeless tobacco. He reports that he drinks about 3.0 - 3.6 oz of alcohol per week. He reports that he does not use drugs.   Family History:  The patient's family history includes Breast cancer in his mother; Diabetes in his daughter; Healthy in his brother; Heart attack in his maternal grandfather; Hyperlipidemia in his son; Prostate cancer in his father; Stroke in his father and paternal uncle; Uterine cancer in his mother.    ROS:  Please see the history of present illness.  All other systems are reviewed and negative.     PHYSICAL EXAM: VS:  BP 132/70   Pulse 64   Ht 6\' 3"  (1.905 m)   Wt 202 lb (91.6 kg)   SpO2 97%   BMI 25.25 kg/m  , BMI Body mass index is 25.25 kg/m. GEN: Well nourished, well developed, in no acute distress  HEENT: normal  Neck: no JVD, no masses. No carotid bruits Cardiac: RRR without murmur or gallop Respiratory:  clear to auscultation bilaterally, normal work of breathing GI: soft, nontender, nondistended, + BS MS: no deformity or atrophy  Ext: no pretibial edema, pedal pulses 2+= bilaterally Skin: warm and dry, no rash Neuro:  Strength and sensation are intact Psych: euthymic mood, full affect  EKG:  EKG is not ordered today.  Recent Labs: 08/06/2017: ALT 35; Magnesium 2.5; TSH 2.43 12/31/2017: BUN 13; Creatinine, Ser 1.06; Hemoglobin 16.3; Platelets 175; Potassium 4.7; Sodium 138   Lipid Panel  Labs(Brief)          Component Value Date/Time   CHOL 188 08/06/2017 1619   CHOL 183 02/07/2015 1535   TRIG 76.0 08/06/2017 1619   TRIG 81 02/07/2015 1535   HDL 63.20 08/06/2017 1619   HDL 63 02/07/2015 1535   CHOLHDL 3 08/06/2017 1619   VLDL 15.2 08/06/2017 1619   LDLCALC 110 (H) 08/06/2017 1619   LDLCALC 104 (H) 02/07/2015 1535   LDLDIRECT 172.0 07/24/2007 0000           Wt Readings from Last 3 Encounters:  01/20/18 202 lb (91.6 kg)  01/06/18 195 lb (88.5 kg)  12/04/17 197 lb (89.4 kg)     Cardiac Studies Reviewed: TEE: Study Conclusions  - Left ventricle: The cavity size was normal. Wall thickness was normal. Systolic function was normal. The estimated ejection fraction was in the range of 55% to 60%. Wall motion was normal; there were no regional wall motion abnormalities. - Left atrium: No evidence of thrombus in the atrial cavity or appendage. - Right atrium: No evidence of thrombus in the atrial cavity or appendage. - Atrial septum: There was a small secundum atrial septal defect. Doppler showed a small  bidirectional atrial level shunt, in the baseline state. 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  Echo 03/14/2015: Study Conclusions  - Left ventricle: The cavity size was normal. Systolic function was normal. The estimated ejection fraction was in the range of 55% to 60%. Wall motion was normal; there were no regional wall motion abnormalities. Left ventricular diastolic function parameters were normal. - Atrial septum: There was a moderate right-to-left atrial level shunt, in the baseline state. There was redundancy of the septum, with borderline criteria for aneurysm.  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   The patient is counseled about the association of PFO and cryptogenic stroke. Available clinical trial data is reviewed, specifically those trials comparing transcatheter PFO closure and medical therapy with antiplatelet drugs. The patient understands the potential benefit of PFO closure with respect to secondary stroke reduction compared with medical therapy alone. Specific risks of transcatheter PFO closure are reviewed with the patient. These risks include bleeding, infection, device embolization, stroke, cardiac perforation, tamponade, arrhythmia, MI, and late device erosion. He understands these serious risks occur at low incidence of <1%. We discussed the risk of atrial fibrillation may be as high as 3% but that risk declines with time.   Factors in favor of PFO device closure are the anatomic characteristics of the PFO as it is relatively large and associated with atrial septal aneurysm. The  patient understands his age and time removed from his stroke place him outside of the clinical trial data we discussed today. However, he feels that he is very healthy for his age and I agree his expected longevity is good. Much of the benefit with respect to stroke risk reduction after PFO closure has been over an extended period of time and has been more robust in patient's with similar PFO anatomy (large defect with atrial septal aneurysm). After our discussion, he would like to consider his options further. He likely will pursue PFO closure. I offered him a referral back to Dr Posey Pronto with neurology who I thought could give him another perspective on his treatment options. He will be back in touch if he decides to pursue PFO closure. He understands we would treat him with ASA long term, plavix for 3 months, and he would follow SBE prophylaxis for 6 months following device closure.   Current medicines are reviewed with the patient today.  The patient does not have concerns regarding medicines.  Labs/ tests ordered today include:  No orders of the defined types were placed in this encounter.   Disposition:   Neurology referral to Dr Olene Craven, Sherren Mocha, MD  01/20/2018 3:39 PM    [address], [place], [place]  [address] Phone: [phone]; Fax: [phone]   ADDENDUM 03/27/18: The patient in interviewed and examined. No clinical changes since his office visit in April. He has seen Dr Posey Pronto with neurology and I have discussed his case with her - we are in agreement that PFO closure is indicated for secondary stroke prevention. He started plavix last week and is tolerating well without bleeding problems. Reviewed procedural risks and indication again with the patient and his wife. All of their questions are answered.   Sherren Mocha 03/27/2018 7:25 AM

## 2018-03-28 MED FILL — Heparin Sod (Porcine)-NaCl IV Soln 1000 Unit/500ML-0.9%: INTRAVENOUS | Qty: 1000 | Status: AC

## 2018-04-04 ENCOUNTER — Encounter: Payer: Self-pay | Admitting: Physician Assistant

## 2018-04-07 ENCOUNTER — Ambulatory Visit (INDEPENDENT_AMBULATORY_CARE_PROVIDER_SITE_OTHER): Payer: 59 | Admitting: *Deleted

## 2018-04-07 DIAGNOSIS — I639 Cerebral infarction, unspecified: Secondary | ICD-10-CM

## 2018-04-08 NOTE — Progress Notes (Signed)
[facility] Summary Report / Loop Recorder 

## 2018-04-14 NOTE — Progress Notes (Addendum)
[facility]                                       Cardiology Office Note    Date:  04/16/2018   ID:  Steve [name], DOB 01/25/1951, MRN [id number]  PCP:  Binnie Rail, MD  Cardiologist: Sanda Klein, MD   CC: 1 month s/p PFO closure  History of Present Illness:  Steve [name] is a 67 y.o. male with a history of HLD, GERD and cryptogenic CVA with PFO/atrial septal aneurysm s/p PFO closure (03/27/18) who presents to clinic for follow up.   He woke up in 2016 with right arm numbness but didn't pay much attention to it.' He also noted that his fine motor skills were abnormal such as his writing. His symptoms fully resolved. He underwent a full outpatient neurologic evaluation including brain MRI, carotid studies, cardiac echo, outpatient event monitor, then implantable loop recorder. He's now worn an outpatient monitor x 18 months and there has been no atrial fibrillation. TEE 01/13/18 showed a large PFO (valve incompetent with left-to-right and bidirectional flow at rest) with associated atrial septal aneurysm.   He was evaluated by Dr. Burt Knack for PFO closure. Dr. Burt Knack felt like PFO closure was indicated given large PFO with atrial septal aneurysm.   He underwent succesful PFO closure on 03/27/18 with a 25 mm Amplatzaer PFO occluder device. He was placed on ASA and plavix which will be continued at least 3 months.   Today he presents to clinic for follow up. No CP or SOB. No LE edema, orthopnea or PND. No dizziness or syncope. No blood in stool or urine. No palpitations.  No new neuro sx.   Past Medical History:  Diagnosis Date  . Benign hypertrophy of prostate    Dr.Dahlstedt  . Diverticulosis 2013   Moderate   . GERD (gastroesophageal reflux disease)   . Hx of colonic polyp 2008 & 2013   X2  . Hyperlipidemia   . Internal hemorrhoids 2013  . Sleep apnea   . Squamous cell skin cancer    Dr.King [place]  . Stricture  esophagus 2013    dilation X 1  . Ulcerative esophagitis 2013    Past Surgical History:  Procedure Laterality Date  . COLONOSCOPY W/ POLYPECTOMY  2013   tubular adenoma  . COLONOSCOPY W/ POLYPECTOMY  2008   hyperplastic  . EP IMPLANTABLE DEVICE N/A 01/03/2016   Procedure: Loop Recorder Insertion;  Surgeon: Sanda Klein, MD;  Location: [facility];  Service: Cardiovascular;  Laterality: N/A;  . MOHS SURGERY     x 3  . NASAL SINUS SURGERY    . PATENT FORAMEN OVALE(PFO) CLOSURE N/A 03/27/2018   Procedure: PATENT FORAMEN OVALE (PFO) CLOSURE;  Surgeon: Sherren Mocha, MD;  Location: [facility];  Service: Cardiovascular;  Laterality: N/A;  . ROTATOR CUFF REPAIR    . TEE WITHOUT CARDIOVERSION N/A 01/06/2018   Procedure: TRANSESOPHAGEAL ECHOCARDIOGRAM (TEE);  Surgeon: Sanda Klein, MD;  Location: [facility];  Service: Cardiovascular;  Laterality: N/A;  . torn biceps tendon     surgical repair  . uvulectomy & tonsillectomy  2008   Dr Wilburn Cornelia    Current Medications: Outpatient Medications Prior to Visit  Medication Sig Dispense Refill  . aspirin 81 MG tablet Take 81 mg by mouth daily.      Marland Kitchen  clopidogrel (PLAVIX) 75 MG tablet Take 1 tablet (75 mg total) by mouth daily. 30 tablet 11  . ibuprofen (ADVIL,MOTRIN) 200 MG tablet Take 400-600 mg by mouth every 6 (six) hours as needed for headache or moderate pain.    Marland Kitchen OVER THE COUNTER MEDICATION Take 3 capsules by mouth daily. Corydalis (for dehydration/cramping)    . pantoprazole (PROTONIX) 40 MG tablet Take 1 tablet (40 mg total) by mouth daily. 30 tablet 11  . Polyethyl Glycol-Propyl Glycol (SYSTANE OP) Place 2 drops into both eyes at bedtime.    . simvastatin (ZOCOR) 40 MG tablet Take 40 mg by mouth daily.    . vardenafil (LEVITRA) 20 MG tablet Take 20 mg by mouth daily as needed for erectile dysfunction.      No facility-administered medications prior to visit.      Allergies:   Patient has no known allergies.    Social History   Socioeconomic History  . Marital status: Married    Spouse name: Not on file  . Number of children: Not on file  . Years of education: Not on file  . Highest education level: Not on file  Occupational History  . 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 Financial resource strain: Not on file  . Food insecurity:    Worry: Not on file    Inability: Not on file  . Transportation needs:    Medical: Not on file    Non-medical: Not on file  Tobacco Use  . Smoking status: Former Smoker    Packs/day: 0.50    Years: 10.00    Pack years: 5.00    Types: Cigarettes    Last attempt to quit: 1983    Years since quitting: 36.5  . Smokeless tobacco: Never Used  . Tobacco comment: smoked 1972-1982, up to 1/2 ppd  Substance and Sexual Activity  . Alcohol use: Yes    Alcohol/week: 3.0 - 3.6 oz    Types: 5 - 6 Standard drinks or equivalent per week    Comment: 3-4 drinks per week  . Drug use: No  . Sexual activity: Not on file  Lifestyle  . Physical activity:    Days per week: Not on file    Minutes per session: Not on file  . Stress: Not on file  Relationships  . Social connections:    Talks on phone: Not on file    Gets together: Not on file    Attends religious service: Not on file    Active member of club or organization: Not on file    Attends meetings of clubs or organizations: Not on file    Relationship status: Not on file  Other Topics Concern  . Not on file  Social History Narrative   GETS REG EXERCISE 4x weekly   NO DIET   Lives with wife in a 2 story home.  Has 2 children.     Works as a Insurance underwriter.     Education: college        Family History:  The patient's family history includes Breast cancer in his mother; Diabetes in his daughter; Healthy in his brother; Heart attack in his maternal grandfather; Hyperlipidemia in his son; Prostate cancer in his father; Stroke in his father and paternal uncle; Uterine cancer in his  mother.      ROS:   Please see the history of present illness.    ROS All other systems reviewed and are negative.   PHYSICAL EXAM:  VS:  BP 126/66   Pulse 61   Ht 6\' 2"  (1.88 m)   Wt 195 lb 3.2 oz (88.5 kg)   SpO2 98%   BMI 25.06 kg/m    GEN: Well nourished, well developed, in no acute distress  HEENT: normal. Mild right facial droop noted Neck: no JVD, carotid bruits, or masses Cardiac: RRR; no murmurs, rubs, or gallops,no edema  Respiratory:  clear to auscultation bilaterally, normal work of breathing GI: soft, nontender, nondistended, + BS MS: no deformity or atrophy  Skin: warm and dry, no rash Neuro:  Alert and Oriented x 3, Strength and sensation are intact Psych: euthymic mood, full affect   Wt Readings from Last 3 Encounters:  04/16/18 195 lb 3.2 oz (88.5 kg)  03/27/18 195 lb (88.5 kg)  02/05/18 201 lb (91.2 kg)     Studies/Labs Reviewed:   EKG:  EKG is NOT ordered today.    Recent Labs: 08/06/2017: ALT 35; Magnesium 2.5; TSH 2.43 03/19/2018: BUN 15; Creatinine, Ser 0.84; Hemoglobin 15.8; Platelets 194; Potassium 4.5; Sodium 137   Lipid Panel    Component Value Date/Time   CHOL 188 08/06/2017 1619   CHOL 183 02/07/2015 1535   TRIG 76.0 08/06/2017 1619   TRIG 81 02/07/2015 1535   HDL 63.20 08/06/2017 1619   HDL 63 02/07/2015 1535   CHOLHDL 3 08/06/2017 1619   VLDL 15.2 08/06/2017 1619   LDLCALC 110 (H) 08/06/2017 1619   LDLCALC 104 (H) 02/07/2015 1535   LDLDIRECT 172.0 07/24/2007 0000    Additional studies/ records that were reviewed today include:   03/27/18 PATENT FORAMEN OVALE (PFO) CLOSURE  Conclusion  Successful Transcatheter PFO Closure using a 25 mm Amplatzer PFO occluder device Recommend: ASA/Plavix x 3 months, limited echo with bubble 6 months    Limited echo: 03/27/18 Study Conclusions - Left ventricle: The cavity size was normal. Wall thickness was   normal. Systolic function was normal. The estimated ejection   fraction was in  the range of 60% to 65%. Wall motion was normal;   there were no regional wall motion abnormalities. - Right ventricle: The cavity size was normal. Systolic function   was normal. - Atrial septum: The patient is s/p PFO closure. The device appears   well-seated, no evidence for flow across the device. Impressions: - Limited echo.   ASSESSMENT & PLAN:   PFO s/p PFO closure: doing well. No new neurologic symptoms. Groin site healed well. Continue ASA and plavix. Can stop plavix 9/20 and will continued on ASA 81mg  daily indefinitely. Will get limited echo with bubble at 6 months (09/2018). SBE prophylaxis x6 months discussed. He had routine dental cleaning two weeks ago but thankfully had no issues. He understands the need for Abx going forward if he requires dental work in the next 5 months.   Cryptogenic CVA: s/p PFO closure as above. Continue statin and antiplatelet therapy  GERD: continue PPI  HLD: continue statin   Medication Adjustments/Labs and Tests Ordered: Current medicines are reviewed at length with the patient today.  Concerns regarding medicines are outlined above.  Medication changes, Labs and Tests ordered today are listed in the Patient Instructions below. Patient Instructions  Medication Instructions:  Your physician has recommended you make the following change in your medication: Stop Plavix (clopidogrel) on September 20,2019    Labwork: none  Testing/Procedures: Your physician has requested that you have an echocardiogram. Echocardiography is a painless test that uses sound waves to create images of your heart. It  provides your doctor with information about the size and shape of your heart and how well your heart's chambers and valves are working. This procedure takes approximately one hour. There are no restrictions for this procedure. Limited echo with bubble study--To be done in [facility] December 20,2019    Follow-Up: Follow up with Dr. Loletha Grayer as  planned.  Your physician wants you to follow-up in: 11 months with K. Grandville Silos, [place] You will receive a reminder letter in the mail two months in advance. If you don't receive a letter, please call our office to schedule the follow-up appointment.    Any Other Special Instructions Will Be Listed Below (If Applicable).     If you need a refill on your cardiac medications before your next appointment, please call your pharmacy.      Signed, Angelena Form, PA-C  04/16/2018 2:31 PM    [facility] [address], [place], [place]  [address] Phone: [phone]; Fax: [phone]

## 2018-04-16 ENCOUNTER — Ambulatory Visit (INDEPENDENT_AMBULATORY_CARE_PROVIDER_SITE_OTHER): Payer: 59 | Admitting: Physician Assistant

## 2018-04-16 ENCOUNTER — Encounter: Payer: Self-pay | Admitting: Physician Assistant

## 2018-04-16 VITALS — BP 126/66 | HR 61 | Ht 74.0 in | Wt 195.2 lb

## 2018-04-16 DIAGNOSIS — Q211 Atrial septal defect: Secondary | ICD-10-CM

## 2018-04-16 DIAGNOSIS — Q2112 Patent foramen ovale: Secondary | ICD-10-CM

## 2018-04-16 DIAGNOSIS — I639 Cerebral infarction, unspecified: Secondary | ICD-10-CM

## 2018-04-16 DIAGNOSIS — K219 Gastro-esophageal reflux disease without esophagitis: Secondary | ICD-10-CM

## 2018-04-16 DIAGNOSIS — E7800 Pure hypercholesterolemia, unspecified: Secondary | ICD-10-CM | POA: Diagnosis not present

## 2018-04-16 NOTE — Patient Instructions (Addendum)
Medication Instructions:  Your physician has recommended you make the following change in your medication: Stop Plavix (clopidogrel) on September 20,2019    Labwork: none  Testing/Procedures: Your physician has requested that you have an echocardiogram. Echocardiography is a painless test that uses sound waves to create images of your heart. It provides your doctor with information about the size and shape of your heart and how well your heart's chambers and valves are working. This procedure takes approximately one hour. There are no restrictions for this procedure. Limited echo with bubble study--To be done in [facility] December 20,2019    Follow-Up: Follow up with Dr. Loletha Grayer as planned.  Your physician wants you to follow-up in: 11 months with K. Grandville Silos, [place] You will receive a reminder letter in the mail two months in advance. If you don't receive a letter, please call our office to schedule the follow-up appointment.    Any Other Special Instructions Will Be Listed Below (If Applicable).     If you need a refill on your cardiac medications before your next appointment, please call your pharmacy.

## 2018-04-23 ENCOUNTER — Ambulatory Visit: Payer: 59 | Admitting: Physician Assistant

## 2018-05-01 ENCOUNTER — Telehealth: Payer: Self-pay | Admitting: Cardiovascular Disease

## 2018-05-01 NOTE — Telephone Encounter (Signed)
Spoke to patient who recently developed a tingling without numbness in his right arm.  This has been happening over the past 2 weeks, since 7/10 f/u.  He had a PFO done on 6/20 and just wanted to make [place] aware of this symptom.  He has no other symptoms (SOB, CP).  Please advise, thank you.

## 2018-05-01 NOTE — Telephone Encounter (Signed)
New Message       Patient called stating that he is experiencing some tingling in right arm. Patient is concerned and would like to talk with someone concerning this matter.

## 2018-05-02 NOTE — Telephone Encounter (Signed)
I tried calling patient back but got no answer. I did leave a message of where to call me back.  Angelena Form PA-C  [facility]

## 2018-05-02 NOTE — Telephone Encounter (Signed)
  [facility]   MULTIDISCIPLINARY HEART VALVE TEAM  I spoke to Mr. Thurner. He has had some intermittent tingling in his R shoulder and hand x 2 weeks. It doesn't bother him but just wanted to make [place] aware. He denies weakness, motor issues, difficulty speaking, dizziness or visual changes. He otherwise feels fine. He does have some baseline right shoulder issues. Recent echo showed a well seated PFO closure device with no flow.   We discussed how this is likely 2/2 to an irritated nerve and nothing to be worried about. He will continue to monitor and let us know if anything changes or worsens.  Angelena Form PA-C  [facility]

## 2018-05-02 NOTE — Telephone Encounter (Signed)
Agree. thanks

## 2018-05-06 LAB — CUP PACEART REMOTE DEVICE CHECK
Date Time Interrogation Session: 20190630133739
Implantable Pulse Generator Implant Date: 20170328

## 2018-05-09 ENCOUNTER — Ambulatory Visit (INDEPENDENT_AMBULATORY_CARE_PROVIDER_SITE_OTHER): Payer: 59 | Admitting: *Deleted

## 2018-05-09 DIAGNOSIS — I639 Cerebral infarction, unspecified: Secondary | ICD-10-CM

## 2018-05-09 NOTE — Progress Notes (Signed)
[facility] Summary Report / Loop Recorder 

## 2018-05-12 ENCOUNTER — Ambulatory Visit: Payer: 59 | Admitting: Neurology

## 2018-05-12 ENCOUNTER — Encounter

## 2018-06-11 ENCOUNTER — Ambulatory Visit (INDEPENDENT_AMBULATORY_CARE_PROVIDER_SITE_OTHER): Payer: 59 | Admitting: *Deleted

## 2018-06-11 DIAGNOSIS — I639 Cerebral infarction, unspecified: Secondary | ICD-10-CM

## 2018-06-12 NOTE — Progress Notes (Signed)
[facility] Summary Report / Loop Recorder 

## 2018-06-13 ENCOUNTER — Ambulatory Visit (INDEPENDENT_AMBULATORY_CARE_PROVIDER_SITE_OTHER): Payer: 59 | Admitting: Neurology

## 2018-06-13 ENCOUNTER — Encounter: Payer: Self-pay | Admitting: Neurology

## 2018-06-13 VITALS — BP 120/80 | HR 66 | Ht 74.0 in | Wt 192.2 lb

## 2018-06-13 DIAGNOSIS — M5412 Radiculopathy, cervical region: Secondary | ICD-10-CM | POA: Diagnosis not present

## 2018-06-13 DIAGNOSIS — M79605 Pain in left leg: Secondary | ICD-10-CM

## 2018-06-13 DIAGNOSIS — Z8673 Personal history of transient ischemic attack (TIA), and cerebral infarction without residual deficits: Secondary | ICD-10-CM

## 2018-06-13 DIAGNOSIS — M47816 Spondylosis without myelopathy or radiculopathy, lumbar region: Secondary | ICD-10-CM

## 2018-06-13 NOTE — Patient Instructions (Addendum)
We will request MRI report from [facility] physical therapy for leg strengthening  Return to clinic in 6 months

## 2018-06-13 NOTE — Progress Notes (Signed)
Follow-up Visit   Date: 06/13/18    Steve [name] MRN: [id number] DOB: [birth year]   Interim History: Steve [name] is a 67 y.o. right-handed Caucasian male with right hemifacial spasm, hyperlipidemia, BPH, GERD, and left semiovale stroke (2016, manifesting with right arm weakness and facial numbness) returning to the clinic for follow-up of stroke s/p PFO closure and new complaints of right arm paresthesias and left leg tightness. The patient was accompanied to the clinic by self.  History of present illness: On April 23rd, he woke up with right arm heaviness, he was able to lift his arm. When he went to brush his teeth, he noticed that there was difficulty with applying pressure on his toothbrush. Moments later, he developed right cheek and torso tingling sensation. There was no weakness of the right lower extremity or the face. Denies any trouble swallowing, talking, or drooling. Symptoms resolved within about 6 hours. He has noticed that his signature and hand writing is showing subtle differences. He injured his right rotator cuff in March and thought symptoms could be related to that. No associated slurred speech. He stopped taking aspirin several years ago and restarted aspirin 81mg  when these symptoms started.   He has seen Dr. Erling Cruz in the late 1990s for hemifacial spasm and gets botox injected at [facility] every three months.  UPDATE 03/24/2014:  Patient is here to discuss the results of his testing which showed a small left centrum semiovale stroke.  There is no evidence of large vessel occlusion. Echocardiogram is normal except moderate right-to-left shunt without atrial septal aneurysm. He has no new complaints, and in fact reports fine motor movements have improved. He is here with multiple questions and concerns regarding ongoing workup.  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  He has not had any interval neurological spells.  Implantable loop recorder has not showed any cardiac arrhythmia.  He is otherwise doing great.    UPDATE 06/13/2018:  He is here for follow-up visit. He had PFO closure on 6/20 and has uneventful post-op course.  Several weeks later, he noticed intermittent paresthesia of the right arm, starting in the shoulder and radiating down into the forearm and thumb.  The frequency has improved and now only occurs a few times per week. No weakness or neck pain.    He also complains of left sided tightness of the thigh which radiates into the left thigh. Sometimes, he has difficulty raising the left leg, such as with wearing pants.  There is some associated low back pain.  He has seen [place] [facility] in the past and had MRI lumbar spine which showed degenerative changes at L4-5.  Medications:  Current Outpatient Medications on File Prior to Visit  Medication Sig Dispense Refill  . aspirin 81 MG tablet Take 81 mg by mouth daily.      . clopidogrel (PLAVIX) 75 MG tablet Take 1 tablet (75 mg total) by mouth daily. 30 tablet 11  . ibuprofen (ADVIL,MOTRIN) 200 MG tablet Take 400-600 mg by mouth every 6 (six) hours as needed for headache or moderate pain.    Marland Kitchen omeprazole (PRILOSEC) 40 MG capsule     . OVER THE COUNTER MEDICATION Take 3 capsules by mouth daily. Corydalis (for dehydration/cramping)    . pantoprazole (PROTONIX) 40 MG tablet Take 1 tablet (40 mg total) by mouth daily. 30 tablet 11  . Polyethyl Glycol-Propyl Glycol (  SYSTANE OP) Place 2 drops into both eyes at bedtime.    . simvastatin (ZOCOR) 40 MG tablet Take 40 mg by mouth daily.    . tadalafil (CIALIS) 20 MG tablet     . vardenafil (LEVITRA) 20 MG tablet Take 20 mg by mouth daily as needed for erectile dysfunction.      No current facility-administered medications on file prior to visit.     Allergies: No Known Allergies  Review of Systems:  CONSTITUTIONAL: No fevers, chills, night  sweats, or weight loss.  EYES: No visual changes or eye pain ENT: No hearing changes.  No history of nose bleeds.   RESPIRATORY: No cough, wheezing and shortness of breath.   CARDIOVASCULAR: Negative for chest pain, and palpitations.   GI: Negative for abdominal discomfort, blood in stools or black stools.  No recent change in bowel habits.   GU:  No history of incontinence.   MUSCLOSKELETAL: No history of joint pain or swelling.  No myalgias.   SKIN: Negative for lesions, rash, and itching.   ENDOCRINE: Negative for cold or heat intolerance, polydipsia or goiter.   PSYCH:  No depression or anxiety symptoms.   NEURO: As Above.   Vital Signs:  BP 120/80   Pulse 66   Ht 6\' 2"  (1.88 m)   Wt 192 lb 4 oz (87.2 kg)   SpO2 97%   BMI 24.68 kg/m    General Medical Exam:   General:  Well appearing, comfortable  Eyes/ENT: see cranial nerve examination.   Neck: No masses appreciated.  Full range of motion without tenderness.  No carotid bruits. Respiratory:  Clear to auscultation, good air entry bilaterally.   Cardiac:  Regular rate and rhythm, no murmur.   Ext:  No edema  Neurological Exam: MENTAL STATUS including orientation to time, place, person, recent and remote memory, attention span and concentration, language, and fund of knowledge is normal.  Speech is not dysarthric.  CRANIAL NERVES:  Pupils equal round and reactive to light.  Normal conjugate, extra-ocular eye movements in all directions of gaze. Right facial asymmetry involving the forehead and cheek, there is some wrinkling of the frontalis muscle and the right, weakness of the buccinator, asymmetrical smile on the right (chemodenervation due to Botox).  Moderate right ptosis.  Motor movements are intact on the left. There is intermittent right hemifacial spasm.  Palate elevates symmetrically.  Tongue is midline.  MOTOR:  Motor strength is 5/5 in all extremities.  No pronator drift.  Tone is normal.    MSRs:  Reflexes are 2+/4  throughout.  COORDINATION/GAIT:  Normal finger-to- nose-finger.  Intact rapid alternating movements bilaterally.  Gait narrow based and stable.   Data: MRI/A brain and neck 03/18/2015: 1. Mild white matter signal changes in the left posterior centrum semiovale with subtle diffusion signal changes suggestive of subacute white matter infarct. No mass effect or hemorrhage. 2. Otherwise normal for age noncontrast MRI appearance of the brain. 3. Left greater than right carotid bifurcation atherosclerosis with no hemodynamically significant stenosis in the neck. 4. Negative intracranial MRA.  Lab Results  Component Value Date   CHOL 188 08/06/2017   HDL 63.20 08/06/2017   LDLCALC 110 (H) 08/06/2017   LDLDIRECT 172.0 07/24/2007   TRIG 76.0 08/06/2017   CHOLHDL 3 08/06/2017   Lab Results  Component Value Date   HGBA1C 5.5 03/09/2016   Lab Results  Component Value Date   TSH 2.43 08/06/2017   Echocardiogram 03/14/2015: - Left ventricle: The cavity size  was normal. Systolic function was normal. The estimated ejection fraction was in the range of 55%to 60%. Wall motion was normal; there were no regional wall motion abnormalities. Left ventricular diastolic function parameters were normal. - Atrial septum: There was a moderate right-to-left atrial level shunt, in the baseline state. There was redundancy of the septum, with borderline criteria for aneurysm.  TEE 01/13/2018: - Left ventricle: The cavity size was normal. Wall thickness was normal. Systolic function was normal. The estimated ejection   fraction was in the range of 55% to 60%. Wall motion was normal; there were no regional wall motion abnormalities.  - Left atrium: No evidence of thrombus in the atrial cavity or appendage. - Right atrium: No evidence of thrombus in the atrial cavity or appendage. - Atrial septum: There was a small secundum atrial septal defect. Doppler showed a small bidirectional atrial level shunt, in  the   baseline state. 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   IMPRESSION/PLAN: 1.  Left leg tightness, likely from lumbar spondylosis with canal stenosis.  This has been imaged at [facility], I will request copy of his last MRI.  Start physical therapy for left leg stretching and low back strengthening.  2.  Right arm paresthesias, most likely due to cervical radiculopathy (?C6 radiculopathy based on distribution). Fortunately, symptoms are improving and occurring only a few times per week.  There is nothing worrisome on his exam, sensation, strength, and reflexes are normal. Continue to follow.  If paresthesias get worse, proceed with NCS/EMG of the right arm.   3.  History of small left posterior centrum semiovale stroke manifesting with transient R arm weakness and hemisensory loss in April 2016. Etiology suspected to be secondary to embolic stroke of unknown source.  TEE showed atrial septal defect and large atrial septal aneurysm and he has successfully had PFO closure by Dr. Burt Knack, whose help is appreciated. Continue aspirin 81mg  daily and zocor 40mg  daily.  BP is well-controlled.    4.  Right hemifacial spasm, followed at [facility] and managed with Botox injections.  Return to clinic in 6 months  Greater than 50% of this 25 minute visit was spent in counseling, explanation of diagnosis, planning of further management, and coordination of care.   Thank you for allowing me to participate in patient's care.  If I can answer any additional questions, I would be pleased to do so.    Sincerely,    Oaklee Sunga K. Posey Pronto, DO

## 2018-06-19 LAB — CUP PACEART REMOTE DEVICE CHECK
Date Time Interrogation Session: 20190802133919
Implantable Pulse Generator Implant Date: 20170328

## 2018-06-19 NOTE — Progress Notes (Signed)
Addendum  MRI lumbar spine performed at [facility] 08/14/2013: 1.  L4-5: Compression of both L4 nerve roots by a disc bulge and bilateral facet DJD 2.  Diffuse facet DJD, severe on the right at L2-3 and on the left at L4-5 and on the right at health 5-S1 3.  L3-4: small left foraminal and lateral protrusion with minimal foraminal stenosis 4.  L2-3: Mild right lateral recess stenosis caused by facet DJD.  Chloee Tena K. Posey Pronto, DO

## 2018-07-01 LAB — CUP PACEART REMOTE DEVICE CHECK
Date Time Interrogation Session: 20190904133900
Implantable Pulse Generator Implant Date: 20170328

## 2018-07-07 ENCOUNTER — Ambulatory Visit: Payer: 59 | Admitting: Neurology

## 2018-07-14 ENCOUNTER — Ambulatory Visit (INDEPENDENT_AMBULATORY_CARE_PROVIDER_SITE_OTHER): Payer: 59 | Admitting: *Deleted

## 2018-07-14 DIAGNOSIS — I639 Cerebral infarction, unspecified: Secondary | ICD-10-CM

## 2018-07-15 NOTE — Progress Notes (Signed)
[facility] Summary Report / Loop Recorder 

## 2018-07-28 LAB — CUP PACEART REMOTE DEVICE CHECK
Date Time Interrogation Session: 20191007144153
Implantable Pulse Generator Implant Date: 20170328

## 2018-07-30 DIAGNOSIS — H353190 Nonexudative age-related macular degeneration, unspecified eye, stage unspecified: Secondary | ICD-10-CM | POA: Insufficient documentation

## 2018-07-30 DIAGNOSIS — H353132 Nonexudative age-related macular degeneration, bilateral, intermediate dry stage: Secondary | ICD-10-CM | POA: Insufficient documentation

## 2018-08-06 NOTE — Progress Notes (Signed)
Subjective:    Patient ID: Steve [name], male    DOB: [birth year], 67 y.o.   MRN: [id number]  HPI He is here for a physical exam.   He has no concerns and overall feels well.  The only change in his health is having his PFO closed over the summer.    Medications and allergies reviewed with patient and updated if appropriate.  Patient Active Problem List   Diagnosis Date Noted  . Hair thinning 08/06/2017  . B12 deficiency 08/06/2017  . Status post placement of implantable loop recorder 04/24/2016  . Basal cell carcinoma 03/09/2016  . Squamous cell carcinoma 03/09/2016  . 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 History of arterial ischemic stroke 12/08/2015  . GERD (gastroesophageal reflux disease) 11/09/2015  . TIA (transient ischemic attack) 09/30/2015  . Abnormal echocardiogram 09/30/2015  . History of esophageal stricture 04/20/2013  . VITAMIN D DEFICIENCY 06/07/2010  . BENIGN PROSTATIC HYPERTROPHY 06/07/2010  . LIBIDO, DECREASED 06/07/2010  . FIRST DEGREE ATRIOVENTRICULAR BLOCK 02/28/2009  . History of colonic polyps 06/18/2008  . HYPERLIPIDEMIA 08/26/2007  . Abnormal involuntary movement 07/24/2007  . SKIN CANCER, HX OF 07/24/2007  . Sleep apnea 07/15/2007    Current Outpatient Medications on File Prior to Visit  Medication Sig Dispense Refill  . aspirin 81 MG tablet Take 81 mg by mouth daily.      Marland Kitchen OVER THE COUNTER MEDICATION Take 3 capsules by mouth daily. Corydalis (for dehydration/cramping)    . Polyethyl Glycol-Propyl Glycol (SYSTANE OP) Place 2 drops into both eyes at bedtime.    . simvastatin (ZOCOR) 40 MG tablet Take 40 mg by mouth daily.    . vardenafil (LEVITRA) 20 MG tablet Take 20 mg by mouth daily as needed for erectile dysfunction.     Marland Kitchen omeprazole (PRILOSEC) 40 MG capsule     . pantoprazole (PROTONIX) 40 MG tablet Take 1 tablet (40 mg total) by mouth daily. (Patient not taking: Reported on 08/07/2018) 30 tablet 11   No  current facility-administered medications on file prior to visit.     Past Medical History:  Diagnosis Date  . Benign hypertrophy of prostate    Dr.Dahlstedt  . Diverticulosis 2013   Moderate   . GERD (gastroesophageal reflux disease)   . Hx of colonic polyp 2008 & 2013   X2  . Hyperlipidemia   . Internal hemorrhoids 2013  . Sleep apnea   . Squamous cell skin cancer    Dr.King [place]  . Stricture esophagus 2013    dilation X 1  . Ulcerative esophagitis 2013    Past Surgical History:  Procedure Laterality Date  . COLONOSCOPY W/ POLYPECTOMY  2013   tubular adenoma  . COLONOSCOPY W/ POLYPECTOMY  2008   hyperplastic  . EP IMPLANTABLE DEVICE N/A 01/03/2016   Procedure: Loop Recorder Insertion;  Surgeon: Sanda Klein, MD;  Location: [place] [facility];  Service: Cardiovascular;  Laterality: N/A;  . MOHS SURGERY     x 3  . NASAL SINUS SURGERY    . PATENT FORAMEN OVALE(PFO) CLOSURE N/A 03/27/2018   Procedure: PATENT FORAMEN OVALE (PFO) CLOSURE;  Surgeon: Sherren Mocha, MD;  Location: [place] [facility];  Service: Cardiovascular;  Laterality: N/A;  . ROTATOR CUFF REPAIR    . TEE WITHOUT CARDIOVERSION N/A 01/06/2018   Procedure: TRANSESOPHAGEAL ECHOCARDIOGRAM (TEE);  Surgeon: Sanda Klein, MD;  Location: [place];  Service: Cardiovascular;  Laterality: N/A;  . torn biceps tendon  surgical repair  . uvulectomy & tonsillectomy  2008   Dr Wilburn Cornelia    Social History   Socioeconomic History  . Marital status: Married    Spouse name: Not on file  . Number of children: Not on file  . Years of education: Not on file  . Highest education level: Not on file  Occupational History  . 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 Financial resource strain: Not on file  . Food insecurity:    Worry: Not on file    Inability: Not on file  . Transportation needs:    Medical: Not on file    Non-medical: Not on file  Tobacco Use  . Smoking status: Former  Smoker    Packs/day: 0.50    Years: 10.00    Pack years: 5.00    Types: Cigarettes    Last attempt to quit: 1983    Years since quitting: 36.8  . Smokeless tobacco: Never Used  . Tobacco comment: smoked 1972-1982, up to 1/2 ppd  Substance and Sexual Activity  . Alcohol use: Yes    Alcohol/week: 5.0 - 6.0 standard drinks    Types: 5 - 6 Standard drinks or equivalent per week    Comment: 3-4 drinks per week  . Drug use: No  . Sexual activity: Not on file  Lifestyle  . Physical activity:    Days per week: Not on file    Minutes per session: Not on file  . Stress: Not on file  Relationships  . Social connections:    Talks on phone: Not on file    Gets together: Not on file    Attends religious service: Not on file    Active member of club or organization: Not on file    Attends meetings of clubs or organizations: Not on file    Relationship status: Not on file  Other Topics Concern  . Not on file  Social History Narrative   GETS REG EXERCISE 4x weekly   NO DIET   Lives with wife in a 2 story home.  Has 2 children.     Works as a Insurance underwriter.     Education: college       Family History  Problem Relation Age of Onset  . Uterine cancer Mother   . Breast cancer Mother        Deceased, 33  . Stroke Father        > 27  . Prostate cancer Father        Deceased, 74  . Diabetes Daughter        type 1  . Stroke Paternal Uncle        > 55  . Heart attack Maternal Grandfather         >55/mid-60s  . Hyperlipidemia Son   . Healthy Brother   . Colon cancer Neg Hx   . Rectal cancer Neg Hx   . Stomach cancer Neg Hx   . Esophageal cancer Neg Hx     Review of Systems  Constitutional: Negative for chills and fever.  Eyes: Negative for visual disturbance.  Respiratory: Negative for cough, shortness of breath and wheezing.   Cardiovascular: Negative for chest pain, palpitations and leg swelling.  Gastrointestinal: Negative for abdominal pain, blood in stool,  constipation, diarrhea and nausea.       Gerd controlled  Genitourinary: Negative for dysuria and hematuria.  Musculoskeletal: Positive for back pain (occasionally). Negative for arthralgias.  Skin: Negative  for color change and rash.  Neurological: Negative for dizziness, light-headedness and headaches.  Psychiatric/Behavioral: Negative for dysphoric mood. The patient is not nervous/anxious.        Objective:   Vitals:   08/07/18 0803  BP: 120/72  Pulse: 73  Resp: 16  Temp: 98.2 F (36.8 C)  SpO2: 96%   Filed Weights   08/07/18 0803  Weight: 192 lb (87.1 kg)   Body mass index is 24.65 kg/m.  Wt Readings from Last 3 Encounters:  08/07/18 192 lb (87.1 kg)  06/13/18 192 lb 4 oz (87.2 kg)  04/16/18 195 lb 3.2 oz (88.5 kg)     Physical Exam Constitutional: He appears well-developed and well-nourished. No distress.  HENT:  Head: Normocephalic and atraumatic.  Tic/spasm right eye lid Right Ear: External ear normal.  Left Ear: External ear normal.  Mouth/Throat: Oropharynx is clear and moist.  Normal ear canals and TM b/l  Eyes: Conjunctivae and EOM are normal.  Neck: Neck supple. No tracheal deviation present. No thyromegaly present.  No carotid bruit  Cardiovascular: Normal rate, regular rhythm, normal heart sounds and intact distal pulses.   No murmur heard. Pulmonary/Chest: Effort normal and breath sounds normal. No respiratory distress. He has no wheezes. He has no rales.  Abdominal: Soft. He exhibits no distension. There is no tenderness.  Genitourinary: deferred  Musculoskeletal: He exhibits no edema.  Lymphadenopathy:   He has no cervical adenopathy.  Skin: Skin is warm and dry. He is not diaphoretic.  Psychiatric: He has a normal mood and affect. His behavior is normal.         Assessment & Plan:   Physical exam: Screening blood work ordered Immunizations   flu vaccine at work, shingles today, others up-to-date Colonoscopy    up-to-date Eye exams     up-to-date EKG     07/2017 Exercise   regular-gym, yoga, piliates, walks dog, golf - 5/week Weight   BMI normal Skin sees dermatology twice a year, no concerns today Substance abuse     none  See Problem List for Assessment and Plan of chronic medical problems.   FU in 1 year

## 2018-08-06 NOTE — Patient Instructions (Addendum)
Tests ordered today. Your results will be released to [place] (or called to you) after review, usually within 72hours after test completion. If any changes need to be made, you will be notified at that same time.  All other Health Maintenance issues reviewed.   All recommended immunizations and age-appropriate screenings are up-to-date or discussed.  Shingles immunization administered today.   Medications reviewed and updated.  Changes include :   none    Please followup in one year    Health Maintenance, Male A healthy lifestyle and preventive care is important for your health and wellness. Ask your health care provider about what schedule of regular examinations is right for you. What should I know about weight and diet? Eat a Healthy Diet  Eat plenty of vegetables, fruits, whole grains, low-fat dairy products, and lean protein.  Do not eat a lot of foods high in solid fats, added sugars, or salt.  Maintain a Healthy Weight Regular exercise can help you achieve or maintain a healthy weight. You should:  Do at least 150 minutes of exercise each week. The exercise should increase your heart rate and make you sweat (moderate-intensity exercise).  Do strength-training exercises at least twice a week.  Watch Your Levels of Cholesterol and Blood Lipids  Have your blood tested for lipids and cholesterol every 5 years starting at 68 years of age. If you are at high risk for heart disease, you should start having your blood tested when you are 68 years old. You may need to have your cholesterol levels checked more often if: ? Your lipid or cholesterol levels are high. ? You are older than 68 years of age. ? You are at high risk for heart disease.  What should I know about cancer screening? Many types of cancers can be detected early and may often be prevented. Lung Cancer  You should be screened every year for lung cancer if: ? You are a current smoker who has smoked for at least  30 years. ? You are a former smoker who has quit within the past 15 years.  Talk to your health care provider about your screening options, when you should start screening, and how often you should be screened.  Colorectal Cancer  Routine colorectal cancer screening usually begins at 68 years of age and should be repeated every 5-10 years until you are 68 years old. You may need to be screened more often if early forms of precancerous polyps or small growths are found. Your health care provider may recommend screening at an earlier age if you have risk factors for colon cancer.  Your health care provider may recommend using home test kits to check for hidden blood in the stool.  A small camera at the end of a tube can be used to examine your colon (sigmoidoscopy or colonoscopy). This checks for the earliest forms of colorectal cancer.  Prostate and Testicular Cancer  Depending on your age and overall health, your health care provider may do certain tests to screen for prostate and testicular cancer.  Talk to your health care provider about any symptoms or concerns you have about testicular or prostate cancer.  Skin Cancer  Check your skin from head to toe regularly.  Tell your health care provider about any new moles or changes in moles, especially if: ? There is a change in a mole's size, shape, or color. ? You have a mole that is larger than a pencil eraser.  Always use sunscreen. Apply sunscreen  liberally and repeat throughout the day.  Protect yourself by wearing long sleeves, pants, a wide-brimmed hat, and sunglasses when outside.  What should I know about heart disease, diabetes, and high blood pressure?  If you are 24-81 years of age, have your blood pressure checked every 3-5 years. If you are 25 years of age or older, have your blood pressure checked every year. You should have your blood pressure measured twice-once when you are at a hospital or clinic, and once when you  are not at a hospital or clinic. Record the average of the two measurements. To check your blood pressure when you are not at a hospital or clinic, you can use: ? An automated blood pressure machine at a pharmacy. ? A home blood pressure monitor.  Talk to your health care provider about your target blood pressure.  If you are between 47-90 years old, ask your health care provider if you should take aspirin to prevent heart disease.  Have regular diabetes screenings by checking your fasting blood sugar level. ? If you are at a normal weight and have a low risk for diabetes, have this test once every three years after the age of 64. ? If you are overweight and have a high risk for diabetes, consider being tested at a younger age or more often.  A one-time screening for abdominal aortic aneurysm (AAA) by ultrasound is recommended for men aged 79-75 years who are current or former smokers. What should I know about preventing infection? Hepatitis B If you have a higher risk for hepatitis B, you should be screened for this virus. Talk with your health care provider to find out if you are at risk for hepatitis B infection. Hepatitis C Blood testing is recommended for:  Everyone born from [birth year] through 1966.  Anyone with known risk factors for hepatitis C.  Sexually Transmitted Diseases (STDs)  You should be screened each year for STDs including gonorrhea and chlamydia if: ? You are sexually active and are younger than 68 years of age. ? You are older than 68 years of age and your health care provider tells you that you are at risk for this type of infection. ? Your sexual activity has changed since you were last screened and you are at an increased risk for chlamydia or gonorrhea. Ask your health care provider if you are at risk.  Talk with your health care provider about whether you are at high risk of being infected with HIV. Your health care provider may recommend a prescription medicine to  help prevent HIV infection.  What else can I do?  Schedule regular health, dental, and eye exams.  Stay current with your vaccines (immunizations).  Do not use any tobacco products, such as cigarettes, chewing tobacco, and e-cigarettes. If you need help quitting, ask your health care provider.  Limit alcohol intake to no more than 2 drinks per day. One drink equals 12 ounces of beer, 5 ounces of wine, or 1 ounces of hard liquor.  Do not use street drugs.  Do not share needles.  Ask your health care provider for help if you need support or information about quitting drugs.  Tell your health care provider if you often feel depressed.  Tell your health care provider if you have ever been abused or do not feel safe at home. This information is not intended to replace advice given to you by your health care provider. Make sure you discuss any questions you have with your  health care provider. Document Released: 03/22/2008 Document Revised: 05/23/2016 Document Reviewed: 06/28/2015 Elsevier Interactive Patient Education  Drelyn Schein.

## 2018-08-07 ENCOUNTER — Other Ambulatory Visit (INDEPENDENT_AMBULATORY_CARE_PROVIDER_SITE_OTHER): Payer: 59

## 2018-08-07 ENCOUNTER — Encounter: Payer: Self-pay | Admitting: Internal Medicine

## 2018-08-07 ENCOUNTER — Ambulatory Visit (INDEPENDENT_AMBULATORY_CARE_PROVIDER_SITE_OTHER): Payer: 59 | Admitting: Internal Medicine

## 2018-08-07 VITALS — BP 120/72 | HR 73 | Temp 98.2°F | Resp 16 | Ht 74.0 in | Wt 192.0 lb

## 2018-08-07 DIAGNOSIS — Z0000 Encounter for general adult medical examination without abnormal findings: Secondary | ICD-10-CM

## 2018-08-07 DIAGNOSIS — E782 Mixed hyperlipidemia: Secondary | ICD-10-CM

## 2018-08-07 DIAGNOSIS — Z23 Encounter for immunization: Secondary | ICD-10-CM | POA: Diagnosis not present

## 2018-08-07 DIAGNOSIS — Q211 Atrial septal defect: Secondary | ICD-10-CM | POA: Diagnosis not present

## 2018-08-07 DIAGNOSIS — K219 Gastro-esophageal reflux disease without esophagitis: Secondary | ICD-10-CM

## 2018-08-07 DIAGNOSIS — E538 Deficiency of other specified B group vitamins: Secondary | ICD-10-CM

## 2018-08-07 DIAGNOSIS — G459 Transient cerebral ischemic attack, unspecified: Secondary | ICD-10-CM

## 2018-08-07 DIAGNOSIS — I253 Aneurysm of heart: Secondary | ICD-10-CM

## 2018-08-07 DIAGNOSIS — Z85828 Personal history of other malignant neoplasm of skin: Secondary | ICD-10-CM

## 2018-08-07 LAB — CBC WITH DIFFERENTIAL/PLATELET
Basophils Absolute: 0 10*3/uL (ref 0.0–0.1)
Basophils Relative: 0.7 % (ref 0.0–3.0)
Eosinophils Absolute: 0.1 10*3/uL (ref 0.0–0.7)
Eosinophils Relative: 1.9 % (ref 0.0–5.0)
HCT: 44.3 % (ref 39.0–52.0)
Hemoglobin: 15.5 g/dL (ref 13.0–17.0)
Lymphocytes Relative: 26.6 % (ref 12.0–46.0)
Lymphs Abs: 1.7 10*3/uL (ref 0.7–4.0)
MCHC: 34.9 g/dL (ref 30.0–36.0)
MCV: 92.9 fl (ref 78.0–100.0)
Monocytes Absolute: 0.6 10*3/uL (ref 0.1–1.0)
Monocytes Relative: 8.9 % (ref 3.0–12.0)
Neutro Abs: 3.9 10*3/uL (ref 1.4–7.7)
Neutrophils Relative %: 61.9 % (ref 43.0–77.0)
Platelets: 180 10*3/uL (ref 150.0–400.0)
RBC: 4.77 Mil/uL (ref 4.22–5.81)
RDW: 12.7 % (ref 11.5–15.5)
WBC: 6.3 10*3/uL (ref 4.0–10.5)

## 2018-08-07 LAB — COMPREHENSIVE METABOLIC PANEL
ALT: 19 U/L (ref 0–53)
AST: 19 U/L (ref 0–37)
Albumin: 4.5 g/dL (ref 3.5–5.2)
Alkaline Phosphatase: 54 U/L (ref 39–117)
BUN: 13 mg/dL (ref 6–23)
CO2: 31 mEq/L (ref 19–32)
Calcium: 9.2 mg/dL (ref 8.4–10.5)
Chloride: 103 mEq/L (ref 96–112)
Creatinine, Ser: 1.09 mg/dL (ref 0.40–1.50)
GFR: 71.38 mL/min (ref >60.00)
Glucose, Bld: 110 mg/dL — ABNORMAL HIGH (ref 70–99)
Potassium: 4.5 mEq/L (ref 3.5–5.1)
Sodium: 139 mEq/L (ref 135–145)
Total Bilirubin: 0.7 mg/dL (ref 0.2–1.2)
Total Protein: 7 g/dL (ref 6.0–8.3)

## 2018-08-07 LAB — TSH: TSH: 2.53 u[IU]/mL (ref 0.35–4.50)

## 2018-08-07 LAB — LIPID PANEL
Cholesterol: 163 mg/dL (ref 0–200)
HDL: 59.1 mg/dL
LDL Cholesterol: 85 mg/dL (ref 0–99)
NonHDL: 104.03
Total CHOL/HDL Ratio: 3
Triglycerides: 97 mg/dL (ref 0.0–149.0)
VLDL: 19.4 mg/dL (ref 0.0–40.0)

## 2018-08-07 LAB — VITAMIN B12: Vitamin B-12: 268 pg/mL (ref 211–911)

## 2018-08-07 NOTE — Assessment & Plan Note (Signed)
Check B12 level. 

## 2018-08-07 NOTE — Assessment & Plan Note (Signed)
GERD controlled Continue daily medication  

## 2018-08-07 NOTE — Addendum Note (Signed)
Addended by: Delice Bison E on: 08/07/2018 09:01 AM   Modules accepted: Orders

## 2018-08-07 NOTE — Assessment & Plan Note (Signed)
Taking ASA 81 mg daily, statin Loop recorder has showed no abnormalities PFO closed 03/2018 Following with Dr Posey Pronto

## 2018-08-07 NOTE — Assessment & Plan Note (Signed)
Sees derm 2/year

## 2018-08-07 NOTE — Assessment & Plan Note (Signed)
S/p closure 03/2018

## 2018-08-07 NOTE — Assessment & Plan Note (Signed)
Check lipid panel  Continue daily statin Regular exercise and healthy diet encouraged  

## 2018-08-10 ENCOUNTER — Encounter: Payer: Self-pay | Admitting: Internal Medicine

## 2018-08-18 ENCOUNTER — Ambulatory Visit (INDEPENDENT_AMBULATORY_CARE_PROVIDER_SITE_OTHER): Payer: 59 | Admitting: *Deleted

## 2018-08-18 DIAGNOSIS — I639 Cerebral infarction, unspecified: Secondary | ICD-10-CM

## 2018-08-18 NOTE — Progress Notes (Signed)
[facility] Summary Report / Loop Recorder 

## 2018-09-12 ENCOUNTER — Other Ambulatory Visit (HOSPITAL_COMMUNITY): Payer: 59

## 2018-09-18 ENCOUNTER — Ambulatory Visit (INDEPENDENT_AMBULATORY_CARE_PROVIDER_SITE_OTHER): Payer: 59

## 2018-09-18 DIAGNOSIS — I639 Cerebral infarction, unspecified: Secondary | ICD-10-CM

## 2018-09-19 NOTE — Progress Notes (Signed)
[facility] Summary Report / Loop Recorder 

## 2018-09-25 ENCOUNTER — Other Ambulatory Visit: Payer: Self-pay

## 2018-09-25 ENCOUNTER — Other Ambulatory Visit: Payer: Self-pay | Admitting: Internal Medicine

## 2018-09-25 ENCOUNTER — Ambulatory Visit (HOSPITAL_COMMUNITY): Payer: 59 | Attending: Cardiology

## 2018-09-25 DIAGNOSIS — Q211 Atrial septal defect: Secondary | ICD-10-CM | POA: Insufficient documentation

## 2018-09-25 DIAGNOSIS — I5189 Other ill-defined heart diseases: Secondary | ICD-10-CM

## 2018-09-25 DIAGNOSIS — Q2112 Patent foramen ovale: Secondary | ICD-10-CM

## 2018-09-25 HISTORY — DX: Other ill-defined heart diseases: I51.89

## 2018-09-26 ENCOUNTER — Other Ambulatory Visit (HOSPITAL_COMMUNITY): Payer: 59

## 2018-10-04 LAB — CUP PACEART REMOTE DEVICE CHECK
Date Time Interrogation Session: 20191109204007
Implantable Pulse Generator Implant Date: 20170328

## 2018-10-21 ENCOUNTER — Ambulatory Visit (INDEPENDENT_AMBULATORY_CARE_PROVIDER_SITE_OTHER): Payer: 59

## 2018-10-21 DIAGNOSIS — I639 Cerebral infarction, unspecified: Secondary | ICD-10-CM

## 2018-10-22 ENCOUNTER — Ambulatory Visit (INDEPENDENT_AMBULATORY_CARE_PROVIDER_SITE_OTHER): Payer: 59 | Admitting: Emergency Medicine

## 2018-10-22 DIAGNOSIS — Z23 Encounter for immunization: Secondary | ICD-10-CM | POA: Diagnosis not present

## 2018-10-22 NOTE — Progress Notes (Signed)
[facility] Summary Report / Loop Recorder 

## 2018-10-24 LAB — CUP PACEART REMOTE DEVICE CHECK
Date Time Interrogation Session: 20200114214126
Implantable Pulse Generator Implant Date: 20170328

## 2018-10-30 LAB — CUP PACEART REMOTE DEVICE CHECK
Date Time Interrogation Session: 20191212211148
Implantable Pulse Generator Implant Date: 20170328

## 2018-11-24 ENCOUNTER — Ambulatory Visit (INDEPENDENT_AMBULATORY_CARE_PROVIDER_SITE_OTHER): Payer: 59

## 2018-11-24 DIAGNOSIS — I639 Cerebral infarction, unspecified: Secondary | ICD-10-CM

## 2018-11-24 LAB — CUP PACEART REMOTE DEVICE CHECK
Date Time Interrogation Session: 20200216224034
Implantable Pulse Generator Implant Date: 20170328

## 2018-12-03 NOTE — Progress Notes (Signed)
[facility] Summary Report / Loop Recorder 

## 2018-12-03 NOTE — Progress Notes (Signed)
Patient ID: Steve [name], male   DOB: 09-Apr-1951, 68 y.o.   MRN: [id number]     Cardiology Office Note    Date:  12/09/2018   ID:  Steve [name], DOB 1951-05-23, MRN [id number]  PCP:  Binnie Rail, MD  Cardiologist:   Sanda Klein, MD   No chief complaint on file.   History of Present Illness:  Steve [name] is a 68 y.o. male who presents in follow-up after closure (Amplatzer 25 mm, Dr. Burt Knack,  in June 2019) of a large PFO with atrial septal aneurysm felt to be the cause of his previous ischemic stroke that occurred in 2016.  Implantable loop recorder monitoring has not shown evidence of atrial fibrillation.  The patient specifically denies any chest pain at rest exertion, dyspnea at rest or with exertion, orthopnea, paroxysmal nocturnal dyspnea, syncope, palpitations, focal neurological deficits, intermittent claudication, lower extremity edema, unexplained weight gain, cough, hemoptysis or wheezing.   He had an injury while skiing this year and is scheduled to have an MRI scan on his right shoulder next week.  His loop recorder has detected a single 3-second asymptomatic pause during sleep in May 2018.  Atrial fibrillation was never detected by the device.  It has reached end of service.  We will stop monitoring it.  Past Medical History:  Diagnosis Date  . Benign hypertrophy of prostate    Dr.Dahlstedt  . Diverticulosis 2013   Moderate   . GERD (gastroesophageal reflux disease)   . Hx of colonic polyp 2008 & 2013   X2  . Hyperlipidemia   . Internal hemorrhoids 2013  . Sleep apnea   . Squamous cell skin cancer    Dr.King [place]  . Stricture esophagus 2013    dilation X 1  . Ulcerative esophagitis 2013    Past Surgical History:  Procedure Laterality Date  . COLONOSCOPY W/ POLYPECTOMY  2013   tubular adenoma  . COLONOSCOPY W/ POLYPECTOMY  2008   hyperplastic  . EP IMPLANTABLE DEVICE N/A 01/03/2016   Procedure: Loop Recorder Insertion;  Surgeon: Sanda Klein, MD;  Location: [facility];  Service: Cardiovascular;  Laterality: N/A;  . MOHS SURGERY     x 3  . NASAL SINUS SURGERY    . PATENT FORAMEN OVALE(PFO) CLOSURE N/A 03/27/2018   Procedure: PATENT FORAMEN OVALE (PFO) CLOSURE;  Surgeon: Sherren Mocha, MD;  Location: [facility];  Service: Cardiovascular;  Laterality: N/A;  . ROTATOR CUFF REPAIR    . TEE WITHOUT CARDIOVERSION N/A 01/06/2018   Procedure: TRANSESOPHAGEAL ECHOCARDIOGRAM (TEE);  Surgeon: Sanda Klein, MD;  Location: [facility];  Service: Cardiovascular;  Laterality: N/A;  . torn biceps tendon     surgical repair  . uvulectomy & tonsillectomy  2008   Dr Wilburn Cornelia    Outpatient Medications Prior to Visit  Medication Sig Dispense Refill  . aspirin 81 MG tablet Take 81 mg by mouth daily.      . fluticasone (FLONASE) 50 MCG/ACT nasal spray ONE SPRAY IN EACH NOSTRIL TWICE DAILY ASNEEDED -USE CROSSOVER TECHNIQUE DISCUSSED 16 g 1  . OVER THE COUNTER MEDICATION Take 3 capsules by mouth daily. Corydalis (for dehydration/cramping)    . pantoprazole (PROTONIX) 40 MG tablet Take 1 tablet (40 mg total) by mouth daily. 30 tablet 11  . Polyethyl Glycol-Propyl Glycol (SYSTANE OP) Place 2 drops into both eyes at bedtime.    . simvastatin (ZOCOR) 40 MG tablet TAKE ONE TABLET AT BEDTIME 90 tablet 1  .  vardenafil (LEVITRA) 20 MG tablet Take 20 mg by mouth daily as needed for erectile dysfunction.     Marland Kitchen omeprazole (PRILOSEC) 40 MG capsule      No facility-administered medications prior to visit.      Allergies:   Patient has no known allergies.   Social History   Socioeconomic History  . Marital status: Married    Spouse name: Not on file  . Number of children: Not on file  . Years of education: Not on file  . Highest education level: Not on file  Occupational History  . 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 Financial resource strain: Not on file  . Food insecurity:    Worry: Not on file      Inability: Not on file  . Transportation needs:    Medical: Not on file    Non-medical: Not on file  Tobacco Use  . Smoking status: Former Smoker    Packs/day: 0.50    Years: 10.00    Pack years: 5.00    Types: Cigarettes    Last attempt to quit: 1983    Years since quitting: 37.1  . Smokeless tobacco: Never Used  . Tobacco comment: smoked 1972-1982, up to 1/2 ppd  Substance and Sexual Activity  . Alcohol use: Yes    Alcohol/week: 5.0 - 6.0 standard drinks    Types: 5 - 6 Standard drinks or equivalent per week    Comment: 3-4 drinks per week  . Drug use: No  . Sexual activity: Not on file  Lifestyle  . Physical activity:    Days per week: Not on file    Minutes per session: Not on file  . Stress: Not on file  Relationships  . Social connections:    Talks on phone: Not on file    Gets together: Not on file    Attends religious service: Not on file    Active member of club or organization: Not on file    Attends meetings of clubs or organizations: Not on file    Relationship status: Not on file  Other Topics Concern  . Not on file  Social History Narrative   GETS REG EXERCISE 4x weekly   NO DIET   Lives with wife in a 2 story home.  Has 2 children.     Works as a Insurance underwriter.     Education: college        Family History:  The patient's family history includes Breast cancer in his mother; Diabetes in his daughter; Healthy in his brother; Heart attack in his maternal grandfather; Hyperlipidemia in his son; Prostate cancer in his father; Stroke in his father and paternal uncle; Uterine cancer in his mother.   ROS:   Please see the history of present illness.    ROS All other systems are reviewed and are negative   PHYSICAL EXAM:   VS:  BP 138/74   Pulse 75   Ht 6' 2.5" (1.892 m)   Wt 196 lb 6.4 oz (89.1 kg)   BMI 24.88 kg/m    Recheck blood pressure 131/70 mmHg  General: Alert, oriented x3, no distress, appears healthy Head: no evidence of trauma,  PERRL, EOMI, no exophtalmos or lid lag, no myxedema, no xanthelasma; normal ears, nose and oropharynx Neck: normal jugular venous pulsations and no hepatojugular reflux; brisk carotid pulses without delay and no carotid bruits Chest: clear to auscultation, no signs of consolidation by percussion or palpation, normal fremitus, symmetrical  and full respiratory excursions Cardiovascular: normal position and quality of the apical impulse, regular rhythm, normal first and second heart sounds, no murmurs, rubs or gallops Abdomen: no tenderness or distention, no masses by palpation, no abnormal pulsatility or arterial bruits, normal bowel sounds, no hepatosplenomegaly Extremities: no clubbing, cyanosis or edema; 2+ radial, ulnar and brachial pulses bilaterally; 2+ right femoral, posterior tibial and dorsalis pedis pulses; 2+ left femoral, posterior tibial and dorsalis pedis pulses; no subclavian or femoral bruits Neurological: grossly nonfocal Psych: Normal mood and affect Healthy loop recorder site    Wt Readings from Last 3 Encounters:  12/09/18 196 lb 6.4 oz (89.1 kg)  08/07/18 192 lb (87.1 kg)  06/13/18 192 lb 4 oz (87.2 kg)      Studies/Labs Reviewed:   EKG:  EKG is  ordered today.  Shows sinus rhythm with first-degree AV block and otherwise a normal tracing.  Recent Labs: 08/07/2018: ALT 19; BUN 13; Creatinine, Ser 1.09; Hemoglobin 15.5; Platelets 180.0; Potassium 4.5; Sodium 139; TSH 2.53   Lipid Panel    Component Value Date/Time   CHOL 163 08/07/2018 0903   CHOL 183 02/07/2015 1535   TRIG 97.0 08/07/2018 0903   TRIG 81 02/07/2015 1535   HDL 59.10 08/07/2018 0903   HDL 63 02/07/2015 1535   CHOLHDL 3 08/07/2018 0903   VLDL 19.4 08/07/2018 0903   LDLCALC 85 08/07/2018 0903   LDLCALC 104 (H) 02/07/2015 1535   LDLDIRECT 172.0 07/24/2007 0000    ASSESSMENT:    1. Patent foramen ovale with atrial septal aneurysm, s/p closure 6/19   2. History of arterial ischemic stroke   3.  HYPERLIPIDEMIA   4. Status post placement of implantable loop recorder      PLAN:  In order of problems listed above:   1. PFO s/p closure 2019: Asymptomatic.  No evidence of device related complications.  No recurrent neurological events.  MRI safe device.  No need for antibiotics with surgical procedures. 2. Stroke: He has had a single ischemic event without residual deficits in 2016. 3. HLP:  Target LDL<100.  At target on current dose of statin. 4. ILR: The device is no longer of service.  We discussed explantation versus leaving it in place and he prefers the latter.  It should have no impact on his upcoming MRI.    Medication Adjustments/Labs and Tests Ordered: Current medicines are reviewed at length with the patient today.  Concerns regarding medicines are outlined above.  Medication changes, Labs and Tests ordered today are listed in the Patient Instructions below. Patient Instructions  Medication Instructions:  Your physician recommends that you continue on your current medications as directed. Please refer to the Current Medication list given to you today.  If you need a refill on your cardiac medications before your next appointment, please call your pharmacy.   Follow-Up: At [facility], you and your health needs are our priority.  As part of our continuing mission to provide you with exceptional heart care, we have created designated Provider Care Teams.  These Care Teams include your primary Cardiologist (physician) and Advanced Practice Providers (APPs -  Physician Assistants and Nurse Practitioners) who all work together to provide you with the care you need, when you need it. You will need a follow up appointment in 1 year.  Please call our office 2 months in advance to schedule this appointment.  You may see Dr. Sallyanne Kuster or one of the following Advanced Practice Providers on your designated Care Team: [place]  Meng, PA-C . Fabian Sharp, PA-C  Any Other Special  Instructions Will Be Listed Below (If Applicable). None        Signed, Sanda Klein, MD  12/09/2018 9:10 AM    [facility] [address], [place], [place]  [address] Phone: [phone]; Fax: [phone]

## 2018-12-09 ENCOUNTER — Encounter: Payer: Self-pay | Admitting: Cardiovascular Disease

## 2018-12-09 ENCOUNTER — Ambulatory Visit (INDEPENDENT_AMBULATORY_CARE_PROVIDER_SITE_OTHER): Payer: 59 | Admitting: Cardiovascular Disease

## 2018-12-09 VITALS — BP 138/74 | HR 75 | Ht 74.5 in | Wt 196.4 lb

## 2018-12-09 DIAGNOSIS — Q211 Atrial septal defect: Secondary | ICD-10-CM | POA: Diagnosis not present

## 2018-12-09 DIAGNOSIS — I440 Atrioventricular block, first degree: Secondary | ICD-10-CM

## 2018-12-09 DIAGNOSIS — Z95818 Presence of other cardiac implants and grafts: Secondary | ICD-10-CM | POA: Diagnosis not present

## 2018-12-09 DIAGNOSIS — E782 Mixed hyperlipidemia: Secondary | ICD-10-CM | POA: Diagnosis not present

## 2018-12-09 DIAGNOSIS — I253 Aneurysm of heart: Secondary | ICD-10-CM

## 2018-12-09 DIAGNOSIS — Z8673 Personal history of transient ischemic attack (TIA), and cerebral infarction without residual deficits: Secondary | ICD-10-CM | POA: Diagnosis not present

## 2018-12-09 HISTORY — DX: Atrioventricular block, first degree: I44.0

## 2018-12-09 NOTE — Patient Instructions (Signed)
Medication Instructions:  Your physician recommends that you continue on your current medications as directed. Please refer to the Current Medication list given to you today.  If you need a refill on your cardiac medications before your next appointment, please call your pharmacy.   Follow-Up: At [facility], you and your health needs are our priority.  As part of our continuing mission to provide you with exceptional heart care, we have created designated Provider Care Teams.  These Care Teams include your primary Cardiologist (physician) and Advanced Practice Providers (APPs -  Physician Assistants and Nurse Practitioners) who all work together to provide you with the care you need, when you need it. You will need a follow up appointment in 1 year.  Please call our office 2 months in advance to schedule this appointment.  You may see Dr. Sallyanne Kuster or one of the following Advanced Practice Providers on your designated Care Team: Almyra Deforest, [place] . Fabian Sharp, PA-C  Any Other Special Instructions Will Be Listed Below (If Applicable). None

## 2018-12-16 NOTE — Progress Notes (Signed)
Follow-up Visit   Date: 12/17/18    Steve Steve [name]: [id number] DOB: Feb 26, 1951   Interim History: Steve Steve [name] is a 68 y.o. right-handed Caucasian male with right hemifacial spasm, hyperlipidemia, BPH, GERD, and left semiovale stroke (2016, manifesting with right arm weakness and facial numbness) returning to the clinic for follow-up of cryptogenic stroke.   History of present illness: On April 23rd, he woke up with right arm heaviness, he was able to lift his arm. When he went to brush his teeth, he noticed that there was difficulty with applying pressure on his toothbrush. Moments later, he developed right cheek and torso tingling sensation. There was no weakness of the right lower extremity or the face.  Symptoms resolved within about 6 hours. He has noticed that his signature and hand writing is showing subtle differences.He stopped taking aspirin several years ago and restarted aspirin 81mg  when these symptoms started.   He has seen Dr. Erling Cruz in the late 1990s for hemifacial spasm and gets botox injected at [facility] every three months.  UPDATE 03/24/2014:  Patient is here to discuss the results of his testing which showed a small left centrum semiovale stroke.  There is no evidence of large vessel occlusion. Echocardiogram is normal except moderate right-to-left shunt without atrial septal aneurysm. He has no new complaints, and in fact reports fine motor movements have improved.   UPDATE 06/13/2018:  He is here for follow-up visit. He had PFO closure on 6/20 and has uneventful post-op course.  Several weeks later, he noticed intermittent paresthesia of the right arm, starting in the shoulder and radiating down into the forearm and thumb.  The frequency has improved and now only occurs a few times per week. No weakness or neck pain.    He also complains of left sided tightness of the thigh which radiates into the left thigh. Sometimes, he has difficulty raising the  left leg, such as with wearing pants.  There is some associated low back pain.  He has seen [facility] in the past and had MRI lumbar spine which showed degenerative changes at L4-5.  UPDATE 12/16/2018:  He is here for follow-up visit and reports to be doing well.  He injured his right shoulder while snow skiing and is seeing orthopaedics.  No new neurological complaints. He completed physical therapy and had marked improvement of the left leg tightness. He no longer has numbness/tingling of the right arm.  He saw cardiology last week and his loop recorder has not detected any abnormal rhythms.  He is contemplating whether he would like to have it removed.  He will be transitioning care to Dr. Ramonita Lab in [place] for botox injections.  It has been over a year since his last injection.  Medications:  Current Outpatient Medications on File Prior to Visit  Medication Sig Dispense Refill  . aspirin 81 MG tablet Take 81 mg by mouth daily.      . fluticasone (FLONASE) 50 MCG/ACT nasal spray ONE SPRAY IN EACH NOSTRIL TWICE DAILY ASNEEDED -USE CROSSOVER TECHNIQUE DISCUSSED 16 g 1  . OVER THE COUNTER MEDICATION Take 3 capsules by mouth daily. Corydalis (for dehydration/cramping)    . pantoprazole (PROTONIX) 40 MG tablet Take 1 tablet (40 mg total) by mouth daily. 30 tablet 11  . Polyethyl Glycol-Propyl Glycol (SYSTANE OP) Place 2 drops into both eyes at bedtime.    . simvastatin (ZOCOR) 40 MG tablet TAKE ONE TABLET AT BEDTIME 90 tablet 1  .  vardenafil (LEVITRA) 20 MG tablet Take 20 mg by mouth daily as needed for erectile dysfunction.      No current facility-administered medications on file prior to visit.     Allergies: No Known Allergies  Review of Systems:  CONSTITUTIONAL: No fevers, chills, night sweats, or weight loss.  EYES: No visual changes or eye pain ENT: No hearing changes.  No history of nose bleeds.   RESPIRATORY: No cough, wheezing and shortness of breath.   CARDIOVASCULAR:  Negative for chest pain, and palpitations.   GI: Negative for abdominal discomfort, blood in stools or black stools.  No recent change in bowel habits.   GU:  No history of incontinence.   MUSCLOSKELETAL: No history of joint pain or swelling.  No myalgias.   SKIN: Negative for lesions, rash, and itching.   ENDOCRINE: Negative for cold or heat intolerance, polydipsia or goiter.   PSYCH:  No depression or anxiety symptoms.   NEURO: As Above.   Vital Signs:  BP 128/80   Pulse 71   Temp 97.8 F (36.6 C) (Oral)   Ht 6' 2.5" (1.892 m)   Wt 194 lb 2 oz (88.1 kg)   SpO2 96%   BMI 24.59 kg/m    General Medical Exam:   General:  Well appearing, comfortable, intermittent right hemifacial spasm with right eye closure Eyes/ENT: see cranial nerve examination.   Neck: No masses appreciated.  Full range of motion without tenderness.  No carotid bruits. Respiratory:  Clear to auscultation, good air entry bilaterally.   Cardiac:  Regular rate and rhythm, no murmur.   Ext:  No edema  Neurological Exam: MENTAL STATUS including orientation to time, place, person, recent and remote memory, attention span and concentration, language, and fund of knowledge is normal.  Speech is not dysarthric.  CRANIAL NERVES:  Pupils equal round and reactive to light.  Normal conjugate, extra-ocular eye movements in all directions of gaze. Right facial asymmetry involving the forehead and cheek, there is wrinkling of the right frontalis muscle, mild weakness of the buccinator, asymmetrical smile on the right.  Moderate right ptosis.  Motor movements are intact on the left. There is intermittent and moderate right hemifacial spasm.  Palate elevates symmetrically.  Tongue is midline.  MOTOR:  Motor strength is 5/5 in all extremities.  No pronator drift.  Tone is normal.    MSRs:  Reflexes are 2+/4 throughout.  COORDINATION/GAIT:  Normal finger-to- nose-finger.  Intact rapid alternating movements bilaterally.  Gait  narrow based and stable.   Data: MRI/A brain and neck 03/18/2015: 1. Mild white matter signal changes in the left posterior centrum semiovale with subtle diffusion signal changes suggestive of subacute white matter infarct. No mass effect or hemorrhage. 2. Otherwise normal for age noncontrast MRI appearance of the brain. 3. Left greater than right carotid bifurcation atherosclerosis with no hemodynamically significant stenosis in the neck. 4. Negative intracranial MRA.  Lab Results  Component Value Date   CHOL 163 08/07/2018   HDL 59.10 08/07/2018   LDLCALC 85 08/07/2018   LDLDIRECT 172.0 07/24/2007   TRIG 97.0 08/07/2018   CHOLHDL 3 08/07/2018   Lab Results  Component Value Date   HGBA1C 5.5 03/09/2016   Lab Results  Component Value Date   TSH 2.53 08/07/2018   Echocardiogram 03/14/2015: - Left ventricle: The cavity size was normal. Systolic function was normal. The estimated ejection fraction was in the range of 55%to 60%. Wall motion was normal; there were no regional wall motion abnormalities. Left  ventricular diastolic function parameters were normal. - Atrial septum: There was a moderate right-to-left atrial level shunt, in the baseline state. There was redundancy of the septum, with borderline criteria for aneurysm.  TEE 01/13/2018: - Left ventricle: The cavity size was normal. Wall thickness was normal. Systolic function was normal. The estimated ejection   fraction was in the range of 55% to 60%. Wall motion was normal; there were no regional wall motion abnormalities.  - Left atrium: No evidence of thrombus in the atrial cavity or appendage. - Right atrium: No evidence of thrombus in the atrial cavity or appendage. - Atrial septum: There was a small secundum atrial septal defect. Doppler showed a small bidirectional atrial level shunt, in the   baseline state. 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  MRI lumbar spine performed at [facility] 08/14/2013: 1.  L4-5: Compression of both L4 nerve roots by a disc bulge and bilateral facet DJD 2.  Diffuse facet DJD, severe on the right at L2-3 and on the left at L4-5 and on the right at health 5-S1 3.  L3-4: small left foraminal and lateral protrusion with minimal foraminal stenosis 4.  L2-3: Mild right lateral recess stenosis caused by facet DJD.    IMPRESSION/PLAN: 1.  History of small left posterior centrum semiovale stroke manifesting with transient R arm weakness and hemisensory loss in April 2016. Etiology suspected to be secondary to embolic stroke of unknown source.  TEE showed atrial septal defect and large atrial septal aneurysm and he has successfully had PFO closure in 2019.  He had loop recorder for 3 years which does not show abnormal rhythm.  I would be okay with him having this removed, as likelihood of arrhythmia is very low.  Continue aspirin 81mg  daily and zocor 40mg  daily.  BP is well-controlled.    2. Right hemifacial spasm, followed by Dr. Zonia Kief in [place], [place]  Return to clinic in 9 months  Greater than 50% of this 20 minute visit was spent in counseling, explanation of diagnosis, planning of further management, and coordination of care.    Thank you for allowing me to participate in patient's care.  If I can answer any additional questions, I would be pleased to do so.    Sincerely,    Melysa Schroyer K. Posey Pronto, DO

## 2018-12-17 ENCOUNTER — Ambulatory Visit (INDEPENDENT_AMBULATORY_CARE_PROVIDER_SITE_OTHER): Payer: 59 | Admitting: Neurology

## 2018-12-17 ENCOUNTER — Encounter: Payer: Self-pay | Admitting: Neurology

## 2018-12-17 ENCOUNTER — Telehealth: Payer: Self-pay | Admitting: *Deleted

## 2018-12-17 ENCOUNTER — Other Ambulatory Visit: Payer: Self-pay

## 2018-12-17 VITALS — BP 128/80 | HR 71 | Temp 97.8°F | Ht 74.5 in | Wt 194.1 lb

## 2018-12-17 DIAGNOSIS — Q211 Atrial septal defect: Secondary | ICD-10-CM

## 2018-12-17 DIAGNOSIS — I253 Aneurysm of heart: Secondary | ICD-10-CM

## 2018-12-17 DIAGNOSIS — Z8673 Personal history of transient ischemic attack (TIA), and cerebral infarction without residual deficits: Secondary | ICD-10-CM | POA: Diagnosis not present

## 2018-12-17 DIAGNOSIS — Q2112 Patent foramen ovale: Secondary | ICD-10-CM

## 2018-12-17 NOTE — Telephone Encounter (Signed)
   [facility] Pre-operative Risk Assessment    Request for surgical clearance:  1. What type of surgery is being performed? RIGHT SHOULDER RCR    2. When is this surgery scheduled? TBD    3. What type of clearance is required (medical clearance vs. Pharmacy clearance to hold med vs. Both)?   4. Are there any medications that need to be held prior to surgery and how long?   5. Practice name and name of physician performing surgery? [facility]    6. What is your office phone number? [phone]    7.   What is your office fax number? [place]   Anesthesia type (None, local, MAC, general) ? UNKNOWN

## 2018-12-17 NOTE — Patient Instructions (Addendum)
It was great to see you today  Continue medications as you are taking  Return to clinic in 9 months

## 2018-12-18 NOTE — Telephone Encounter (Signed)
Dr. Sallyanne Kuster From your note am I correct is assuming pt is clear for Rotator cuff surgery?  And can pt hold ASA for 5 days prior?   Results to pre-op pool please

## 2018-12-19 NOTE — Telephone Encounter (Signed)
Yes, OK for surgery and OK to hold ASA. MCr

## 2018-12-22 NOTE — Telephone Encounter (Signed)
Clearance forwarded to requesting provider.

## 2019-01-28 ENCOUNTER — Telehealth: Payer: Self-pay

## 2019-01-28 NOTE — Progress Notes (Signed)
Virtual Visit via Video Note  I connected with Steve [name] on 01/28/19 at 10:30 AM EDT by a video enabled telemedicine application and verified that I am speaking with the correct person using two identifiers.   I discussed the limitations of evaluation and management by telemedicine and the availability of in person appointments. The patient expressed understanding and agreed to proceed.  The patient is currently at home and I am in the office.    No referring provider.    History of Present Illness: This visit is for preoperative clearance.  He fell skiing and injured his right shoulder.  He is here for pre-operative clearance at the request of Dr Mardelle Matte for right shoulder surgery scheduled for as soon as possible once the corona fire situation allows.  He will be having a right reverse shoulder replacement.  Conservative measures have not controlled his pain.  He does have pain whenever he uses the arm and has difficulty sleeping due to pain.  He is eager to have surgery so that he can start the healing process.  He is taking tramadol at night for his pain.  He denies any personal or family history of problems with anesthesia or bleeding/blood clot problems.    He has no concerns.  He is taking all his medication as prescribed.   He is exercising regularly.  Prior to the coronavirus situation he was going to the gym, but now he is walking daily.  He does yoga virtually with his instructor twice a week and he is doing his physical therapy exercises.  With his daily activities he denies chest pain, palpitations, SOB and lightheadedness.    History of TIA,s/p PFO closure 03/2018: He is taking a baby aspirin daily.  He also takes simvastatin daily.  He did have a loop recorder placed after his TIA and has not had any arrhythmias.  Overall he feels well.    Review of Systems  Constitutional: Negative for chills and fever.  Respiratory: Negative for cough, shortness of breath and  wheezing.   Cardiovascular: Negative for chest pain, palpitations and leg swelling.  Gastrointestinal: Negative for blood in stool, constipation, diarrhea, heartburn and nausea.  Neurological: Positive for tingling (right arm) and focal weakness (right arm). Negative for dizziness and headaches.     Social History   Socioeconomic History  . Marital status: Married    Spouse name: Not on file  . Number of children: Not on file  . Years of education: Not on file  . Highest education level: Not on file  Occupational History  . 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 Financial resource strain: Not on file  . Food insecurity:    Worry: Not on file    Inability: Not on file  . Transportation needs:    Medical: Not on file    Non-medical: Not on file  Tobacco Use  . Smoking status: Former Smoker    Packs/day: 0.50    Years: 10.00    Pack years: 5.00    Types: Cigarettes    Last attempt to quit: 1983    Years since quitting: 37.3  . Smokeless tobacco: Never Used  . Tobacco comment: smoked 1972-1982, up to 1/2 ppd  Substance and Sexual Activity  . Alcohol use: Yes    Alcohol/week: 5.0 - 6.0 standard drinks    Types: 5 - 6 Standard drinks or equivalent per week    Comment: 3-4 drinks per week  . Drug use: No  .  Sexual activity: Not on file  Lifestyle  . Physical activity:    Days per week: Not on file    Minutes per session: Not on file  . Stress: Not on file  Relationships  . Social connections:    Talks on phone: Not on file    Gets together: Not on file    Attends religious service: Not on file    Active member of club or organization: Not on file    Attends meetings of clubs or organizations: Not on file    Relationship status: Not on file  Other Topics Concern  . Not on file  Social History Narrative   GETS REG EXERCISE 4x weekly   NO DIET   Lives with wife in a 2 story home.  Has 2 children.     Works as a Insurance underwriter.     Education:  college        Observations/Objective: Appears well in NAD Breathing normally, no obvious shortness of breath Mood and affect normal   BP Readings from Last 3 Encounters:  12/17/18 128/80  12/09/18 138/74  08/07/18 120/72   Lab Results  Component Value Date   WBC 6.3 08/07/2018   HGB 15.5 08/07/2018   HCT 44.3 08/07/2018   PLT 180.0 08/07/2018   GLUCOSE 110 (H) 08/07/2018   CHOL 163 08/07/2018   TRIG 97.0 08/07/2018   HDL 59.10 08/07/2018   LDLDIRECT 172.0 07/24/2007   LDLCALC 85 08/07/2018   ALT 19 08/07/2018   AST 19 08/07/2018   NA 139 08/07/2018   K 4.5 08/07/2018   CL 103 08/07/2018   CREATININE 1.09 08/07/2018   BUN 13 08/07/2018   CO2 31 08/07/2018   TSH 2.53 08/07/2018   PSA 2.67 06/14/2010   INR 1.1 12/31/2017   HGBA1C 5.5 03/09/2016   EKG 12/09/2018: Sinus rhythm at 75 bpm with first-degree AV block, otherwise normal  Assessment and Plan:  See Problem List for Assessment and Plan of chronic medical problems.   Follow Up Instructions:    I discussed the assessment and treatment plan with the patient. The patient was provided an opportunity to ask questions and all were answered. The patient agreed with the plan and demonstrated an understanding of the instructions.   The patient was advised to call back or seek an in-person evaluation if the symptoms worsen or if the condition fails to improve as anticipated.    Binnie Rail, MD

## 2019-01-28 NOTE — Telephone Encounter (Signed)
Virtual set up for tomorrow.

## 2019-01-28 NOTE — Telephone Encounter (Signed)
yes

## 2019-01-28 NOTE — Telephone Encounter (Signed)
I received a surgical clearance from Raliegh Ip for a shoulder replacement for patient. Can a virtual be set up for pre op clearance?

## 2019-01-29 ENCOUNTER — Encounter: Payer: Self-pay | Admitting: Internal Medicine

## 2019-01-29 ENCOUNTER — Ambulatory Visit (INDEPENDENT_AMBULATORY_CARE_PROVIDER_SITE_OTHER): Payer: 59 | Admitting: Internal Medicine

## 2019-01-29 DIAGNOSIS — Z8673 Personal history of transient ischemic attack (TIA), and cerebral infarction without residual deficits: Secondary | ICD-10-CM | POA: Diagnosis not present

## 2019-01-29 DIAGNOSIS — Z01818 Encounter for other preprocedural examination: Secondary | ICD-10-CM | POA: Diagnosis not present

## 2019-01-29 DIAGNOSIS — K219 Gastro-esophageal reflux disease without esophagitis: Secondary | ICD-10-CM | POA: Diagnosis not present

## 2019-01-29 DIAGNOSIS — E782 Mixed hyperlipidemia: Secondary | ICD-10-CM | POA: Diagnosis not present

## 2019-01-29 NOTE — Assessment & Plan Note (Signed)
He is low risk for low risk procedure Medically and cardiac wise he is stable and cleared for surgery We will send paperwork back to Dr. Gerre Pebbles to be done as soon as possible, but will likely not occur before mid July Okay to hold baby aspirin 5-7 days prior to surgery if needed No other evaluation or work-up necessary

## 2019-01-29 NOTE — Assessment & Plan Note (Signed)
GERD controlled Continue pantoprazole 40 mg daily

## 2019-01-29 NOTE — Assessment & Plan Note (Signed)
PFO closure 03/2018 Loop recorder has shown no arrhythmias in 3 years Continue 81 mg of aspirin and statin daily Denies any concerning symptoms

## 2019-01-29 NOTE — Assessment & Plan Note (Signed)
Continue simvastatin 40 mg daily. 

## 2019-02-05 ENCOUNTER — Encounter: Payer: Self-pay | Admitting: Internal Medicine

## 2019-02-05 NOTE — Progress Notes (Signed)
Virtual Visit via Video Note  I connected with Steve [name] on 02/06/19 at 10:30 AM EDT by a video enabled telemedicine application and verified that I am speaking with the correct person using two identifiers.   I discussed the limitations of evaluation and management by telemedicine and the availability of in person appointments. The patient expressed understanding and agreed to proceed.  [place] in the office.    No referring provider.    History of Present Illness: This is an acute visit for trouble sleeping.  He has a lot of shoulder discomfort.  I did a virtual visit with him recently and he will be having shoulder surgery once he is able, but this may not be until mid July.  He is a lot of discomfort at night and that is the main reason he is not able to sleep.  He was taking 3-normal 4 Advil at night and that helped minimally.  Orthopedics put him on tramadol 50 mg every 6 hours.  He was only taking this at night and it was not helpful.  Orthopedics advised that he take 2 at night and he did do that the other night.  He still was having discomfort and was not able to relax.  He felt like his nervous system was on high alert after taking 2 tramadol.  He was not able to stay asleep.  He felt similar symptoms when he had surgery years ago and at that time he was temporarily placed on Ambien.  He is concerned because he knows he will not be having surgery for another 2-1/2 months and needs to find a way to get some rest.     Social History   Socioeconomic History  . Marital status: Married    Spouse name: Not on file  . Number of children: Not on file  . Years of education: Not on file  . Highest education level: Not on file  Occupational History  . 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 Financial resource strain: Not on file  . Food insecurity:    Worry: Not on file    Inability: Not on file  .  Transportation needs:    Medical: Not on file    Non-medical: Not on file  Tobacco Use  . Smoking status: Former Smoker    Packs/day: 0.50    Years: 10.00    Pack years: 5.00    Types: Cigarettes    Last attempt to quit: 1983    Years since quitting: 37.3  . Smokeless tobacco: Never Used  . Tobacco comment: smoked 1972-1982, up to 1/2 ppd  Substance and Sexual Activity  . Alcohol use: Yes    Alcohol/week: 5.0 - 6.0 standard drinks    Types: 5 - 6 Standard drinks or equivalent per week    Comment: 3-4 drinks per week  . Drug use: No  . Sexual activity: Not on file  Lifestyle  . Physical activity:    Days per week: Not on file    Minutes per session: Not on file  . Stress: Not on file  Relationships  . Social connections:    Talks on phone: Not on file    Gets together: Not on file    Attends religious service: Not on file    Active member of club or organization: Not on file    Attends meetings of clubs or organizations: Not on file  Relationship status: Not on file  Other Topics Concern  . Not on file  Social History Narrative   GETS REG EXERCISE 4x weekly   NO DIET   Lives with wife in a 2 story home.  Has 2 children.     Works as a Insurance underwriter.     Education: college        Observations/Objective: Appears well in NAD Normal mood and affect  Assessment and Plan:  See Problem List for Assessment and Plan of chronic medical problems.   Follow Up Instructions:    I discussed the assessment and treatment plan with the patient. The patient was provided an opportunity to ask questions and all were answered. The patient agreed with the plan and demonstrated an understanding of the instructions.   The patient was advised to call back or seek an in-person evaluation if the symptoms worsen or if the condition fails to improve as anticipated.    Binnie Rail, MD

## 2019-02-06 ENCOUNTER — Encounter: Payer: Self-pay | Admitting: Internal Medicine

## 2019-02-06 ENCOUNTER — Ambulatory Visit (INDEPENDENT_AMBULATORY_CARE_PROVIDER_SITE_OTHER): Payer: 59 | Admitting: Internal Medicine

## 2019-02-06 DIAGNOSIS — G479 Sleep disorder, unspecified: Secondary | ICD-10-CM | POA: Diagnosis not present

## 2019-02-06 MED ORDER — ESZOPICLONE 2 MG PO TABS: 2.0000 mg | ORAL_TABLET | Freq: Every evening | ORAL | 0 refills | Status: DC | PRN

## 2019-02-06 NOTE — Assessment & Plan Note (Addendum)
He is experiencing difficulty sleeping, but this is related to his shoulder discomfort.  Unfortunately he will likely not be able to have elective surgery until mid July so we need to find a way for him to be more comfortable at nights that he can sleep Ibuprofen 800 mg at night was not effective and not a good long-term plan Tramadol 100 mg helps some, but made him feel nervous around high alert and he was not able to sleep We reviewed different options-we can address his pain, which is the reason he is not sleeping more address his sleep He is not interested in taking a stronger narcotic so we will address his sleep and he will try to continue the tramadol 100 mg daily For sleep he will try Lunesta 2 mg at night.  In the past he did take Ambien and he admits it was hard to get off He did ask about Xanax, but I advised something like that could be Plan B, but still very addicting and difficult to get off of  He will update me on how he is doing and we can try something different if needed

## 2019-02-11 ENCOUNTER — Encounter: Payer: Self-pay | Admitting: Internal Medicine

## 2019-02-12 MED ORDER — HYDROXYZINE HCL 25 MG PO TABS: 25.0000 mg | ORAL_TABLET | Freq: Every evening | ORAL | 2 refills | Status: DC | PRN

## 2019-02-16 ENCOUNTER — Other Ambulatory Visit: Payer: Self-pay | Admitting: Physician Assistant

## 2019-02-16 DIAGNOSIS — Z8774 Personal history of (corrected) congenital malformations of heart and circulatory system: Secondary | ICD-10-CM

## 2019-02-20 ENCOUNTER — Encounter: Payer: Self-pay | Admitting: Internal Medicine

## 2019-02-20 MED ORDER — ALPRAZOLAM 0.5 MG PO TABS: 0.5000 mg | ORAL_TABLET | Freq: Every evening | ORAL | 0 refills | Status: DC | PRN

## 2019-02-21 ENCOUNTER — Encounter: Payer: Self-pay | Admitting: Internal Medicine

## 2019-03-03 ENCOUNTER — Other Ambulatory Visit (HOSPITAL_COMMUNITY): Payer: 59

## 2019-03-03 ENCOUNTER — Encounter (HOSPITAL_COMMUNITY): Payer: Self-pay

## 2019-03-03 NOTE — Patient Instructions (Addendum)
DUE TO COVID-19 NO VISITORS ARE ALLOWED IN THE HOSPITAL AT THIS TIME   COVID SWAB TESTING MUST BE COMPLETED ON:  Friday, Mar 06, 2019 at 12 Noon (Must self quarantine after testing. Follow instructions on handout.)   Your procedure is scheduled on: Tuesday, March 10, 2019   Surgery Time:  7:30AM-9:30AM   Report to [place]  Entrance   Report to Short Stay at 5:30 AM   Call this number if you have problems the morning of surgery [phone]   Bring CPAP mask and tubing day of surgery   Do not eat food:After Midnight.   May have liquids until 4:30AM day of surgery   CLEAR LIQUID DIET  Foods Allowed                                                                     Foods Excluded  Water, Black Coffee and tea, regular and decaf                             liquids that you cannot  Plain Jell-O in any flavor                                             see through such as: Fruit ices (not with fruit pulp)                                     milk, soups, orange juice  Iced Popsicles                                    All solid food Carbonated beverages, regular and diet                                    Cranberry, grape and apple juices Sports drinks like Gatorade Lightly seasoned clear broth or consume(fat free) Sugar, honey syrup  Sample Menu Breakfast                                Lunch                                     Supper Cranberry juice                    Beef broth                            Chicken broth Jell-O                                     Grape juice  Apple juice Coffee or tea                        Jell-O                                      Popsicle                                                Coffee or tea                        Coffee or tea   Complete one Ensure drink the morning of surgery at 4:30AM the day of surgery.   Brush your teeth the morning of surgery.   Do NOT smoke after Midnight   Take these  medicines the morning of surgery with A SIP OF WATER: Pantoprazole   Flonase if needed                               You may not have any metal on your body including jewelry, and body piercings             Do not wear lotions, powders, perfumes/cologne, or deodorant                           Men may shave face and neck.   Do not bring valuables to the hospital. [place].   Contacts, dentures or bridgework may not be worn into surgery.   Bring small overnight bag day of surgery.    Special Instructions: Bring a copy of your healthcare power of attorney and living will documents         the day of surgery if you haven't scanned them in before.              Please read over the following fact sheets you were given:  [facility] - Preparing for Surgery Before surgery, you can play an important role.  Because skin is not sterile, your skin needs to be as free of germs as possible.  You can reduce the number of germs on your skin by washing with CHG (chlorahexidine gluconate) soap before surgery.  CHG is an antiseptic cleaner which kills germs and bonds with the skin to continue killing germs even after washing. Please DO NOT use if you have an allergy to CHG or antibacterial soaps.  If your skin becomes reddened/irritated stop using the CHG and inform your nurse when you arrive at Short Stay. Do not shave (including legs and underarms) for at least 48 hours prior to the first CHG shower.  You may shave your face/neck.  Please follow these instructions carefully:  1.  Shower with CHG Soap the night before surgery and the  morning of surgery.  2.  If you choose to wash your hair, wash your hair first as usual with your normal  shampoo.  3.  After you shampoo, rinse your hair and body thoroughly to remove the shampoo.  4.  Use CHG as you would any other liquid soap.  You can apply chg directly to the skin and wash.   Gently with a scrungie or clean washcloth.  5.  Apply the CHG Soap to your body ONLY FROM THE NECK DOWN.   Do   not use on face/ open                           Wound or open sores. Avoid contact with eyes, ears mouth and   genitals (private parts).                       Wash face,  Genitals (private parts) with your normal soap.             6.  Wash thoroughly, paying special attention to the area where your    surgery  will be performed.  7.  Thoroughly rinse your body with warm water from the neck down.  8.  DO NOT shower/wash with your normal soap after using and rinsing off the CHG Soap.                9.  Pat yourself dry with a clean towel.            10.  Wear clean pajamas.            11.  Place clean sheets on your bed the night of your first shower and do not  sleep with pets. Day of Surgery : Do not apply any lotions/deodorants the morning of surgery.  Please wear clean clothes to the hospital/surgery center.  FAILURE TO FOLLOW THESE INSTRUCTIONS MAY RESULT IN THE CANCELLATION OF YOUR SURGERY  PATIENT SIGNATURE_________________________________  NURSE SIGNATURE__________________________________  ________________________________________________________________________   Steve [name]  An incentive spirometer is a tool that can help keep your lungs clear and active. This tool measures how well you are filling your lungs with each breath. Taking long deep breaths may help reverse or decrease the chance of developing breathing (pulmonary) problems (especially infection) following:  A long period of time when you are unable to move or be active. BEFORE THE PROCEDURE   If the spirometer includes an indicator to show your best effort, your nurse or respiratory therapist will set it to a desired goal.  If possible, sit up straight or lean slightly forward. Try not to slouch.  Hold the incentive spirometer in an upright position. INSTRUCTIONS FOR USE  1. Sit on the edge of  your bed if possible, or sit up as far as you can in bed or on a chair. 2. Hold the incentive spirometer in an upright position. 3. Breathe out normally. 4. Place the mouthpiece in your mouth and seal your lips tightly around it. 5. Breathe in slowly and as deeply as possible, raising the piston or the ball toward the top of the column. 6. Hold your breath for 3-5 seconds or for as long as possible. Allow the piston or ball to fall to the bottom of the column. 7. Remove the mouthpiece from your mouth and breathe out normally. 8. Rest for a few seconds and repeat Steps 1 through 7 at least 10 times every 1-2 hours when you are awake. Take your time and take a few normal breaths between deep breaths. 9. The spirometer may include an indicator to show your best effort. Use the indicator as a goal to work toward during each  repetition. 10. After each set of 10 deep breaths, practice coughing to be sure your lungs are clear. If you have an incision (the cut made at the time of surgery), support your incision when coughing by placing a pillow or rolled up towels firmly against it. Once you are able to get out of bed, walk around indoors and cough well. You may stop using the incentive spirometer when instructed by your caregiver.  RISKS AND COMPLICATIONS  Take your time so you do not get dizzy or light-headed.  If you are in pain, you may need to take or ask for pain medication before doing incentive spirometry. It is harder to take a deep breath if you are having pain. AFTER USE  Rest and breathe slowly and easily.  It can be helpful to keep track of a log of your progress. Your caregiver can provide you with a simple table to help with this. If you are using the spirometer at home, follow these instructions: [place] IF:   You are having difficultly using the spirometer.  You have trouble using the spirometer as often as instructed.  Your pain medication is not giving enough relief  while using the spirometer.  You develop fever of 100.5 F (38.1 C) or higher. SEEK IMMEDIATE MEDICAL CARE IF:   You cough up bloody sputum that had not been present before.  You develop fever of 102 F (38.9 C) or greater.  You develop worsening pain at or near the incision site. MAKE SURE YOU:   Understand these instructions.  Will watch your condition.  Will get help right away if you are not doing well or get worse. Document Released: 02/04/2007 Document Revised: 12/17/2011 Document Reviewed: 04/07/2007 [facility] Patient Information 2014 [place], [place].   ________________________________________________________________________

## 2019-03-03 NOTE — Pre-Procedure Instructions (Signed)
The following are in epic: Clearance Dr. Quay Burow 01/29/2019 EKG 12/09/2018 Last loop recorder check 11/24/2018 ECHO 09/25/2018

## 2019-03-04 ENCOUNTER — Other Ambulatory Visit: Payer: Self-pay

## 2019-03-04 ENCOUNTER — Encounter (HOSPITAL_COMMUNITY): Payer: Self-pay

## 2019-03-04 ENCOUNTER — Encounter (HOSPITAL_COMMUNITY)
Admission: RE | Admit: 2019-03-04 | Discharge: 2019-03-04 | Disposition: A | Discharge status: Home / Self Care | Payer: 59 | Source: Ambulatory Visit | Attending: Orthopedic Surgery | Admitting: Orthopedic Surgery

## 2019-03-04 DIAGNOSIS — Z01812 Encounter for preprocedural laboratory examination: Secondary | ICD-10-CM | POA: Insufficient documentation

## 2019-03-04 DIAGNOSIS — M19011 Primary osteoarthritis, right shoulder: Secondary | ICD-10-CM | POA: Diagnosis not present

## 2019-03-04 DIAGNOSIS — Z1159 Encounter for screening for other viral diseases: Secondary | ICD-10-CM | POA: Insufficient documentation

## 2019-03-04 HISTORY — DX: Patent foramen ovale: Q21.12

## 2019-03-04 HISTORY — DX: Other specified postprocedural states: Z98.890

## 2019-03-04 HISTORY — DX: Unspecified osteoarthritis, unspecified site: M19.90

## 2019-03-04 HISTORY — DX: Atrial septal defect: Q21.1

## 2019-03-04 HISTORY — DX: Clonic hemifacial spasm, unspecified: G51.39

## 2019-03-04 LAB — CBC
HCT: 43.6 % (ref 39.0–52.0)
Hemoglobin: 14.9 g/dL (ref 13.0–17.0)
MCH: 32.3 pg (ref 26.0–34.0)
MCHC: 34.2 g/dL (ref 30.0–36.0)
MCV: 94.6 fL (ref 80.0–100.0)
Platelets: 169 10*3/uL (ref 150–400)
RBC: 4.61 MIL/uL (ref 4.22–5.81)
RDW: 12.4 % (ref 11.5–15.5)
WBC: 5.4 10*3/uL (ref 4.0–10.5)
nRBC: 0 % (ref 0.0–0.2)

## 2019-03-04 LAB — BASIC METABOLIC PANEL
Anion gap: 8 (ref 5–15)
BUN: 15 mg/dL (ref 8–23)
CO2: 25 mmol/L (ref 22–32)
Calcium: 8.7 mg/dL — ABNORMAL LOW (ref 8.9–10.3)
Chloride: 106 mmol/L (ref 98–111)
Creatinine, Ser: 0.9 mg/dL (ref 0.61–1.24)
GFR calc Af Amer: >60 mL/min (ref >60)
GFR calc non Af Amer: >60 mL/min (ref >60)
Glucose, Bld: 102 mg/dL — ABNORMAL HIGH (ref 70–99)
Potassium: 4.4 mmol/L (ref 3.5–5.1)
Sodium: 139 mmol/L (ref 135–145)

## 2019-03-04 LAB — SURGICAL PCR SCREEN
MRSA, PCR: NEGATIVE
Staphylococcus aureus: NEGATIVE

## 2019-03-04 NOTE — Progress Notes (Signed)
SPOKE W/  [place]io     SCREENING SYMPTOMS OF COVID 19:   COUGH--NO  RUNNY NOSE--- NO  SORE THROAT---NO  NASAL CONGESTION----NO  SNEEZING----NO  SHORTNESS OF BREATH---NO  DIFFICULTY BREATHING---NO  TEMP >100.0 -----NO  UNEXPLAINED BODY ACHES------NO  CHILLS -------- NO  HEADACHES ---------NO  LOSS OF SMELL/ TASTE --------NO    HAVE YOU OR ANY FAMILY MEMBER TRAVELLED PAST 14 DAYS OUT OF THE   COUNTY---Travelled to [place]----NO COUNTRY----NO  HAVE YOU OR ANY FAMILY MEMBER BEEN EXPOSED TO ANYONE WITH COVID 19? NO

## 2019-03-05 NOTE — Progress Notes (Signed)
Anesthesia Chart Review   Case:  [id number] Date/Time:  03/10/19 0715   Procedure:  REVERSE SHOULDER ARTHROPLASTY (Right )   Anesthesia type:  Choice   Pre-op diagnosis:  DEGENERATIVE JOINT DISEASE right shoulder   Location:  [place] / [facility]   Surgeon:  Marchia Bond, MD      DISCUSSION: 68 yo former smoker (5 pack years, quit 10/08/81) with h/o HLD, GERD, CVA, PFO s/p closure 03/27/18, DJD right shoulder scheduled for above procedure 03/10/19 with Dr. Marchia Bond.   Pt last seen by PCP, Dr. Billey Gosling, 01/29/2019.  Per her note, "He is low risk for low risk procedure.  Medically and cardiac wise he is stable and cleared for surgery.  We will send paperwork back to Dr. Gerre Pebbles to be done as soon as possible, but will likely not occur before mid July.  Okay to hold baby aspirin 5-7 days prior to surgery if needed.  No other evaluation or work-up necessary"  Per cardiologist, Dr. Sanda Klein, "Ok for surgery and OK to hold ASA."  Pt can proceed with planned procedure barring acute status change  VS: BP (!) 143/74   Pulse 72   Temp 36.7 C (Oral)   Resp 16   Ht 6\' 3"  (1.905 m)   Wt 89.4 kg   SpO2 98%   BMI 24.62 kg/m   PROVIDERS: Binnie Rail, MD is PCP   Braulio Bosch, MD is Cardiologist  LABS: Labs reviewed: Acceptable for surgery. (all labs ordered are listed, but only abnormal results are displayed)  Labs Reviewed  BASIC METABOLIC PANEL - Abnormal; Notable for the following components:      Result Value   Glucose, Bld 102 (*)    Calcium 8.7 (*)    All other components within normal limits  SURGICAL PCR SCREEN  CBC     IMAGES:   EKG: 12/09/2018 Rate 75 bpm Sinus rhythm with 1st degree AV block   CV: Echo 09/25/18 Study Conclusions  - Left ventricle: The cavity size was normal. Wall thickness was   normal. Systolic function was normal. The estimated ejection   fraction was in the range of 55% to 60%. Wall motion was normal;   there were no  regional wall motion abnormalities. Doppler   parameters are consistent with abnormal left ventricular   relaxation (grade 1 diastolic dysfunction). - Aortic root: The aortic root was mildly dilated. - Atrial septum: An Amplatzer closure device was present.  Impressions:  - Normal LV systolic function; mild diastolic dysfunction; mildly   dilated aortic root; s/p ASD closure; negative saline   microcavitation study. Past Medical History:  Diagnosis Date  . Arthritis    Right shoulder  . Benign hypertrophy of prostate    Dr.Dahlstedt  . Diverticulosis 2013   Moderate   . First degree AV block 12/09/2018   noted on EKG  . GERD (gastroesophageal reflux disease)   . Grade I diastolic dysfunction 82/50/5397   Noted on ECHO   . Hemifacial spasm   . History of loop recorder   . Hx of colonic polyp 2008 & 2013   X2  . Hyperlipidemia   . Internal hemorrhoids 2013  . PFO (patent foramen ovale)    History of   . Sleep apnea    Resolved  . Squamous cell skin cancer    Dr.King [place]  . Stricture esophagus 2013    dilation X 1  . Stroke [facility]) 2016   left semiovale stroke   .  TIA (transient ischemic attack) 01/2015  . Ulcerative esophagitis 2013    Past Surgical History:  Procedure Laterality Date  . COLONOSCOPY W/ POLYPECTOMY  2013   tubular adenoma  . COLONOSCOPY W/ POLYPECTOMY  2008   hyperplastic  . EP IMPLANTABLE DEVICE N/A 01/03/2016   Procedure: Loop Recorder Insertion;  Surgeon: Sanda Klein, MD;  Location: [place] CV LAB;  Service: Cardiovascular;  Laterality: N/A;  . MOHS SURGERY     x 3  . NASAL SINUS SURGERY    . PATENT FORAMEN OVALE(PFO) CLOSURE N/A 03/27/2018   Procedure: PATENT FORAMEN OVALE (PFO) CLOSURE;  Surgeon: Sherren Mocha, MD;  Location: [place] CV LAB;  Service: Cardiovascular;  Laterality: N/A;  . ROTATOR CUFF REPAIR Left   . TEE WITHOUT CARDIOVERSION N/A 01/06/2018   Procedure: TRANSESOPHAGEAL ECHOCARDIOGRAM (TEE);  Surgeon:  Sanda Klein, MD;  Location: [place];  Service: Cardiovascular;  Laterality: N/A;  . torn biceps tendon     surgical repair  . uvulectomy & tonsillectomy  2008   Dr Wilburn Cornelia    MEDICATIONS: . ALPRAZolam Duanne Moron) 0.5 MG tablet  . aspirin 81 MG tablet  . cholecalciferol (VITAMIN D3) 25 MCG (1000 UT) tablet  . fluticasone (FLONASE) 50 MCG/ACT nasal spray  . ibuprofen (ADVIL) 200 MG tablet  . Multiple Vitamins-Minerals (PRESERVISION AREDS 2) CAPS  . pantoprazole (PROTONIX) 40 MG tablet  . Polyethyl Glycol-Propyl Glycol (SYSTANE OP)  . simvastatin (ZOCOR) 40 MG tablet  . traMADol (ULTRAM) 50 MG tablet  . vardenafil (LEVITRA) 20 MG tablet  . vitamin B-12 (CYANOCOBALAMIN) 1000 MCG tablet  . vitamin C (ASCORBIC ACID) 500 MG tablet   No current facility-administered medications for this encounter.     Maia Plan [facility] Pre-Surgical Testing [phone] 03/05/19 1:09 PM

## 2019-03-05 NOTE — Anesthesia Preprocedure Evaluation (Addendum)
Anesthesia Evaluation  Patient identified by MRN, date of birth, ID band Patient awake    Reviewed: Allergy & Precautions, NPO status , Patient's Chart, lab work & pertinent test results  Airway Mallampati: II       Dental no notable dental hx. (+) Teeth Intact   Pulmonary sleep apnea , former smoker,    Pulmonary exam normal breath sounds clear to auscultation       Cardiovascular negative cardio ROS Normal cardiovascular exam Rhythm:Regular Rate:Normal     Neuro/Psych PSYCHIATRIC DISORDERS Anxiety    GI/Hepatic Neg liver ROS, GERD  Medicated,  Endo/Other  negative endocrine ROS  Renal/GU   negative genitourinary   Musculoskeletal  (+) Arthritis , Osteoarthritis,    Abdominal Normal abdominal exam  (+)   Peds  Hematology negative hematology ROS (+)   Anesthesia Other Findings   Reproductive/Obstetrics                           Anesthesia Physical Anesthesia Plan  ASA: II  Anesthesia Plan: General   Post-op Pain Management:  Regional for Post-op pain   Induction: Intravenous  PONV Risk Score and Plan: 2 and Ondansetron and Dexamethasone  Airway Management Planned: Oral ETT  Additional Equipment:   Intra-op Plan:   Post-operative Plan: Extubation in OR  Informed Consent: I have reviewed the patients History and Physical, chart, labs and discussed the procedure including the risks, benefits and alternatives for the proposed anesthesia with the patient or authorized representative who has indicated his/her understanding and acceptance.     Dental advisory given  Plan Discussed with: CRNA  Anesthesia Plan Comments: (See PAT note 03/04/2019, Konrad Felix, PA-C )      Anesthesia Quick Evaluation

## 2019-03-06 ENCOUNTER — Other Ambulatory Visit (HOSPITAL_COMMUNITY)
Admission: RE | Admit: 2019-03-06 | Discharge: 2019-03-06 | Disposition: A | Discharge status: Home / Self Care | Payer: 59 | Source: Ambulatory Visit | Attending: Orthopedic Surgery | Admitting: Orthopedic Surgery

## 2019-03-06 DIAGNOSIS — Z01812 Encounter for preprocedural laboratory examination: Secondary | ICD-10-CM | POA: Diagnosis not present

## 2019-03-07 LAB — NOVEL CORONAVIRUS, NAA (HOSP ORDER, SEND-OUT TO REF LAB; TAT 18-24 HRS): SARS-CoV-2, NAA: NOT DETECTED

## 2019-03-09 ENCOUNTER — Other Ambulatory Visit: Payer: Self-pay | Admitting: Internal Medicine

## 2019-03-09 NOTE — Progress Notes (Signed)
SPOKE [place] OF COVID 19:   COUGH--NO  RUNNY NOSE--NO  SORE THROAT---NO  NASAL CONGESTION----NO  SNEEZING----NO  SHORTNESS OF BREATH---NO  DIFFICULTY BREATHING---NO  TEMP >100.0 -----NO  UNEXPLAINED BODY ACHES------NO  CHILLS --------NO  HEADACHES ---------NO  LOSS OF SMELL/ TASTE --------NO    HAVE YOU OR ANY FAMILY MEMBER TRAVELLED PAST 14 DAYS OUT OF THE   COUNTY---[place]  STATE----NO COUNTRY---- NO  HAVE YOU OR ANY FAMILY MEMBER BEEN EXPOSED TO ANYONE WITH COVID 19? NO

## 2019-03-09 NOTE — Progress Notes (Signed)
VM left for COVID screening.

## 2019-03-09 NOTE — Progress Notes (Deleted)
SPOKE W/ patient via phone.      SCREENING SYMPTOMS OF COVID 19:     COUGH--no   RUNNY NOSE---no    SORE THROAT---no   NASAL CONGESTION----no   SNEEZING---no   SHORTNESS OF BREATH---no   DIFFICULTY BREATHING---no   TEMP >100.0 -----no   UNEXPLAINED BODY ACHES------no   CHILLS --------no    HEADACHES ---------no   LOSS OF SMELL/ TASTE --------no       HAVE YOU OR ANY FAMILY MEMBER TRAVELLED PAST 14 DAYS OUT OF THE    COUNTY---no STATE----no COUNTRY----no   HAVE YOU OR ANY FAMILY MEMBER BEEN EXPOSED TO ANYONE WITH COVID 19?    no 

## 2019-03-10 ENCOUNTER — Inpatient Hospital Stay (HOSPITAL_COMMUNITY)
Admission: RE | Admit: 2019-03-10 | Discharge: 2019-03-11 | DRG: 483 | Disposition: A | Discharge status: Home / Self Care | Payer: 59 | Attending: Orthopedic Surgery | Admitting: Orthopedic Surgery

## 2019-03-10 ENCOUNTER — Encounter (HOSPITAL_COMMUNITY): Payer: Self-pay | Admitting: Emergency Medicine

## 2019-03-10 ENCOUNTER — Inpatient Hospital Stay (HOSPITAL_COMMUNITY): Payer: 59

## 2019-03-10 ENCOUNTER — Other Ambulatory Visit: Payer: Self-pay

## 2019-03-10 ENCOUNTER — Inpatient Hospital Stay (HOSPITAL_COMMUNITY): Payer: 59 | Admitting: Anesthesiology

## 2019-03-10 ENCOUNTER — Inpatient Hospital Stay (HOSPITAL_COMMUNITY): Payer: 59 | Admitting: Physician Assistant

## 2019-03-10 ENCOUNTER — Encounter (HOSPITAL_COMMUNITY)
Admission: RE | Disposition: A | Discharge status: Home / Self Care | Payer: Self-pay | Source: Home / Self Care | Attending: Orthopedic Surgery

## 2019-03-10 DIAGNOSIS — Z91048 Other nonmedicinal substance allergy status: Secondary | ICD-10-CM

## 2019-03-10 DIAGNOSIS — M75101 Unspecified rotator cuff tear or rupture of right shoulder, not specified as traumatic: Secondary | ICD-10-CM | POA: Diagnosis present

## 2019-03-10 DIAGNOSIS — Z8042 Family history of malignant neoplasm of prostate: Secondary | ICD-10-CM | POA: Diagnosis not present

## 2019-03-10 DIAGNOSIS — Z8249 Family history of ischemic heart disease and other diseases of the circulatory system: Secondary | ICD-10-CM | POA: Diagnosis not present

## 2019-03-10 DIAGNOSIS — Z833 Family history of diabetes mellitus: Secondary | ICD-10-CM

## 2019-03-10 DIAGNOSIS — Z79899 Other long term (current) drug therapy: Secondary | ICD-10-CM | POA: Diagnosis not present

## 2019-03-10 DIAGNOSIS — Z79891 Long term (current) use of opiate analgesic: Secondary | ICD-10-CM | POA: Diagnosis not present

## 2019-03-10 DIAGNOSIS — I440 Atrioventricular block, first degree: Secondary | ICD-10-CM | POA: Diagnosis present

## 2019-03-10 DIAGNOSIS — Z823 Family history of stroke: Secondary | ICD-10-CM | POA: Diagnosis not present

## 2019-03-10 DIAGNOSIS — Z7982 Long term (current) use of aspirin: Secondary | ICD-10-CM

## 2019-03-10 DIAGNOSIS — Z86010 Personal history of colonic polyps: Secondary | ICD-10-CM | POA: Diagnosis not present

## 2019-03-10 DIAGNOSIS — Z85828 Personal history of other malignant neoplasm of skin: Secondary | ICD-10-CM

## 2019-03-10 DIAGNOSIS — M19011 Primary osteoarthritis, right shoulder: Secondary | ICD-10-CM | POA: Diagnosis present

## 2019-03-10 DIAGNOSIS — Z96619 Presence of unspecified artificial shoulder joint: Secondary | ICD-10-CM

## 2019-03-10 DIAGNOSIS — E785 Hyperlipidemia, unspecified: Secondary | ICD-10-CM | POA: Diagnosis present

## 2019-03-10 DIAGNOSIS — Z96611 Presence of right artificial shoulder joint: Secondary | ICD-10-CM

## 2019-03-10 DIAGNOSIS — M12811 Other specific arthropathies, not elsewhere classified, right shoulder: Secondary | ICD-10-CM

## 2019-03-10 DIAGNOSIS — N400 Enlarged prostate without lower urinary tract symptoms: Secondary | ICD-10-CM | POA: Diagnosis present

## 2019-03-10 DIAGNOSIS — Z8049 Family history of malignant neoplasm of other genital organs: Secondary | ICD-10-CM | POA: Diagnosis not present

## 2019-03-10 DIAGNOSIS — Z8673 Personal history of transient ischemic attack (TIA), and cerebral infarction without residual deficits: Secondary | ICD-10-CM | POA: Diagnosis not present

## 2019-03-10 DIAGNOSIS — K219 Gastro-esophageal reflux disease without esophagitis: Secondary | ICD-10-CM | POA: Diagnosis present

## 2019-03-10 HISTORY — DX: Other specific arthropathies, not elsewhere classified, right shoulder: M12.811

## 2019-03-10 HISTORY — PX: REVERSE SHOULDER ARTHROPLASTY: SHX5054

## 2019-03-10 HISTORY — DX: Unspecified rotator cuff tear or rupture of right shoulder, not specified as traumatic: M75.101

## 2019-03-10 SURGERY — ARTHROPLASTY, SHOULDER, TOTAL, REVERSE
Anesthesia: General | Laterality: Right

## 2019-03-10 MED ORDER — METHOCARBAMOL 500 MG IVPB - SIMPLE MED
INTRAVENOUS | Status: AC
  Filled 2019-03-10: qty 50

## 2019-03-10 MED ORDER — MEPERIDINE HCL 50 MG/ML IJ SOLN: 6.2500 mg | INTRAMUSCULAR | Status: DC | PRN

## 2019-03-10 MED ORDER — FENTANYL CITRATE (PF) 100 MCG/2ML IJ SOLN
INTRAMUSCULAR | Status: AC
  Filled 2019-03-10: qty 2

## 2019-03-10 MED ORDER — PHENYLEPHRINE HCL (PRESSORS) 10 MG/ML IV SOLN
INTRAVENOUS | Status: AC
  Filled 2019-03-10: qty 1

## 2019-03-10 MED ORDER — TRANEXAMIC ACID-NACL 1000-0.7 MG/100ML-% IV SOLN
1000.0000 mg | Freq: Once | INTRAVENOUS | Status: AC
  Administered 2019-03-10: 1000 mg via INTRAVENOUS
  Filled 2019-03-10: qty 100

## 2019-03-10 MED ORDER — STERILE WATER FOR IRRIGATION IR SOLN
Status: DC | PRN
  Administered 2019-03-10 (×2): 1000 mL

## 2019-03-10 MED ORDER — KETOROLAC TROMETHAMINE 15 MG/ML IJ SOLN
7.5000 mg | Freq: Four times a day (QID) | INTRAMUSCULAR | Status: AC
  Administered 2019-03-10 – 2019-03-11 (×4): 7.5 mg via INTRAVENOUS
  Filled 2019-03-10 (×5): qty 1

## 2019-03-10 MED ORDER — SENNA-DOCUSATE SODIUM 8.6-50 MG PO TABS: 2.0000 | ORAL_TABLET | Freq: Every day | ORAL | 1 refills | Status: DC

## 2019-03-10 MED ORDER — EPHEDRINE 5 MG/ML INJ
INTRAVENOUS | Status: AC
  Filled 2019-03-10: qty 10

## 2019-03-10 MED ORDER — MAGNESIUM CITRATE PO SOLN: 1.0000 | Freq: Once | ORAL | Status: DC | PRN

## 2019-03-10 MED ORDER — OXYCODONE HCL 5 MG PO TABS: 10.0000 mg | ORAL_TABLET | ORAL | Status: DC | PRN

## 2019-03-10 MED ORDER — DOCUSATE SODIUM 100 MG PO CAPS
100.0000 mg | ORAL_CAPSULE | Freq: Two times a day (BID) | ORAL | Status: DC
  Administered 2019-03-10: 100 mg via ORAL
  Filled 2019-03-10: qty 1

## 2019-03-10 MED ORDER — ONDANSETRON HCL 4 MG PO TABS: 4.0000 mg | ORAL_TABLET | Freq: Three times a day (TID) | ORAL | 0 refills | Status: DC | PRN

## 2019-03-10 MED ORDER — PHENOL 1.4 % MT LIQD: 1.0000 | OROMUCOSAL | Status: DC | PRN

## 2019-03-10 MED ORDER — HYDROMORPHONE HCL 1 MG/ML IJ SOLN: 0.2500 mg | INTRAMUSCULAR | Status: DC | PRN

## 2019-03-10 MED ORDER — ACETAMINOPHEN 325 MG PO TABS: 325.0000 mg | ORAL_TABLET | Freq: Four times a day (QID) | ORAL | Status: DC | PRN

## 2019-03-10 MED ORDER — ZOLPIDEM TARTRATE 5 MG PO TABS: 5.0000 mg | ORAL_TABLET | Freq: Every evening | ORAL | Status: DC | PRN

## 2019-03-10 MED ORDER — 0.9 % SODIUM CHLORIDE (POUR BTL) OPTIME
TOPICAL | Status: DC | PRN
  Administered 2019-03-10: 1000 mL

## 2019-03-10 MED ORDER — ROCURONIUM BROMIDE 10 MG/ML (PF) SYRINGE
PREFILLED_SYRINGE | INTRAVENOUS | Status: DC | PRN
  Administered 2019-03-10: 10 mg via INTRAVENOUS
  Administered 2019-03-10: 50 mg via INTRAVENOUS

## 2019-03-10 MED ORDER — ROCURONIUM BROMIDE 10 MG/ML (PF) SYRINGE
PREFILLED_SYRINGE | INTRAVENOUS | Status: AC
  Filled 2019-03-10: qty 10

## 2019-03-10 MED ORDER — SUGAMMADEX SODIUM 200 MG/2ML IV SOLN
INTRAVENOUS | Status: DC | PRN
  Administered 2019-03-10: 400 mg via INTRAVENOUS

## 2019-03-10 MED ORDER — ALUM & MAG HYDROXIDE-SIMETH 200-200-20 MG/5ML PO SUSP: 30.0000 mL | ORAL | Status: DC | PRN

## 2019-03-10 MED ORDER — SUCCINYLCHOLINE CHLORIDE 200 MG/10ML IV SOSY
PREFILLED_SYRINGE | INTRAVENOUS | Status: DC | PRN
  Administered 2019-03-10: 120 mg via INTRAVENOUS

## 2019-03-10 MED ORDER — LIDOCAINE 2% (20 MG/ML) 5 ML SYRINGE
INTRAMUSCULAR | Status: AC
  Filled 2019-03-10: qty 5

## 2019-03-10 MED ORDER — LIDOCAINE 2% (20 MG/ML) 5 ML SYRINGE
INTRAMUSCULAR | Status: DC | PRN
  Administered 2019-03-10: 80 mg via INTRAVENOUS

## 2019-03-10 MED ORDER — DIPHENHYDRAMINE HCL 12.5 MG/5ML PO ELIX: 12.5000 mg | ORAL_SOLUTION | ORAL | Status: DC | PRN

## 2019-03-10 MED ORDER — ONDANSETRON HCL 4 MG/2ML IJ SOLN
INTRAMUSCULAR | Status: AC
  Filled 2019-03-10: qty 2

## 2019-03-10 MED ORDER — PROPOFOL 10 MG/ML IV BOLUS
INTRAVENOUS | Status: AC
  Filled 2019-03-10: qty 20

## 2019-03-10 MED ORDER — LACTATED RINGERS IV SOLN
INTRAVENOUS | Status: DC
  Administered 2019-03-10: 1000 mL via INTRAVENOUS
  Administered 2019-03-10: 06:00:00 via INTRAVENOUS

## 2019-03-10 MED ORDER — SODIUM CHLORIDE 0.9 % IV SOLN
INTRAVENOUS | Status: DC | PRN
  Administered 2019-03-10: 50 ug/min via INTRAVENOUS

## 2019-03-10 MED ORDER — KETOROLAC TROMETHAMINE 15 MG/ML IJ SOLN: 15.0000 mg | Freq: Once | INTRAMUSCULAR | Status: DC

## 2019-03-10 MED ORDER — SUGAMMADEX SODIUM 500 MG/5ML IV SOLN
INTRAVENOUS | Status: AC
  Filled 2019-03-10: qty 5

## 2019-03-10 MED ORDER — METOCLOPRAMIDE HCL 5 MG PO TABS: 5.0000 mg | ORAL_TABLET | Freq: Three times a day (TID) | ORAL | Status: DC | PRN

## 2019-03-10 MED ORDER — OXYCODONE HCL 5 MG PO TABS: 5.0000 mg | ORAL_TABLET | ORAL | 0 refills | Status: DC | PRN

## 2019-03-10 MED ORDER — OXYCODONE HCL 5 MG PO TABS
5.0000 mg | ORAL_TABLET | ORAL | Status: DC | PRN
  Administered 2019-03-10 – 2019-03-11 (×3): 10 mg via ORAL
  Filled 2019-03-10 (×3): qty 2

## 2019-03-10 MED ORDER — TRANEXAMIC ACID-NACL 1000-0.7 MG/100ML-% IV SOLN: 1000.0000 mg | INTRAVENOUS | Status: DC

## 2019-03-10 MED ORDER — CEFAZOLIN SODIUM-DEXTROSE 2-4 GM/100ML-% IV SOLN
2.0000 g | INTRAVENOUS | Status: AC
  Administered 2019-03-10: 2 g via INTRAVENOUS
  Filled 2019-03-10: qty 100

## 2019-03-10 MED ORDER — HYDROMORPHONE HCL 1 MG/ML IJ SOLN: 0.5000 mg | INTRAMUSCULAR | Status: DC | PRN

## 2019-03-10 MED ORDER — PROMETHAZINE HCL 25 MG/ML IJ SOLN: 6.2500 mg | INTRAMUSCULAR | Status: DC | PRN

## 2019-03-10 MED ORDER — METOCLOPRAMIDE HCL 5 MG/ML IJ SOLN: 5.0000 mg | Freq: Three times a day (TID) | INTRAMUSCULAR | Status: DC | PRN

## 2019-03-10 MED ORDER — FENTANYL CITRATE (PF) 250 MCG/5ML IJ SOLN
INTRAMUSCULAR | Status: DC | PRN
  Administered 2019-03-10 (×2): 50 ug via INTRAVENOUS
  Administered 2019-03-10: 100 ug via INTRAVENOUS

## 2019-03-10 MED ORDER — DEXAMETHASONE SODIUM PHOSPHATE 10 MG/ML IJ SOLN
INTRAMUSCULAR | Status: AC
  Filled 2019-03-10: qty 1

## 2019-03-10 MED ORDER — METHOCARBAMOL 500 MG PO TABS
500.0000 mg | ORAL_TABLET | Freq: Four times a day (QID) | ORAL | Status: DC | PRN
  Administered 2019-03-10: 500 mg via ORAL
  Filled 2019-03-10: qty 1

## 2019-03-10 MED ORDER — BUPIVACAINE-EPINEPHRINE (PF) 0.5% -1:200000 IJ SOLN
INTRAMUSCULAR | Status: DC | PRN
  Administered 2019-03-10 (×5): 3 mL via PERINEURAL

## 2019-03-10 MED ORDER — ONDANSETRON HCL 4 MG/2ML IJ SOLN: 4.0000 mg | Freq: Four times a day (QID) | INTRAMUSCULAR | Status: DC | PRN

## 2019-03-10 MED ORDER — PROPOFOL 10 MG/ML IV BOLUS
INTRAVENOUS | Status: DC | PRN
  Administered 2019-03-10: 150 mg via INTRAVENOUS

## 2019-03-10 MED ORDER — ONDANSETRON HCL 4 MG PO TABS: 4.0000 mg | ORAL_TABLET | Freq: Four times a day (QID) | ORAL | Status: DC | PRN

## 2019-03-10 MED ORDER — CHLORHEXIDINE GLUCONATE 4 % EX LIQD: 60.0000 mL | Freq: Once | CUTANEOUS | Status: DC

## 2019-03-10 MED ORDER — POLYETHYLENE GLYCOL 3350 17 G PO PACK: 17.0000 g | PACK | Freq: Every day | ORAL | Status: DC | PRN

## 2019-03-10 MED ORDER — BACLOFEN 10 MG PO TABS: 10.0000 mg | ORAL_TABLET | Freq: Three times a day (TID) | ORAL | 0 refills | Status: DC

## 2019-03-10 MED ORDER — MENTHOL 3 MG MT LOZG: 1.0000 | LOZENGE | OROMUCOSAL | Status: DC | PRN

## 2019-03-10 MED ORDER — ACETAMINOPHEN 500 MG PO TABS
1000.0000 mg | ORAL_TABLET | Freq: Once | ORAL | Status: AC
  Administered 2019-03-10: 1000 mg via ORAL
  Filled 2019-03-10: qty 2

## 2019-03-10 MED ORDER — CEFAZOLIN SODIUM-DEXTROSE 1-4 GM/50ML-% IV SOLN
1.0000 g | Freq: Four times a day (QID) | INTRAVENOUS | Status: AC
  Administered 2019-03-10 – 2019-03-11 (×3): 1 g via INTRAVENOUS
  Filled 2019-03-10 (×5): qty 50

## 2019-03-10 MED ORDER — EPHEDRINE SULFATE-NACL 50-0.9 MG/10ML-% IV SOSY
PREFILLED_SYRINGE | INTRAVENOUS | Status: DC | PRN
  Administered 2019-03-10: 5 mg via INTRAVENOUS
  Administered 2019-03-10: 10 mg via INTRAVENOUS

## 2019-03-10 MED ORDER — BISACODYL 10 MG RE SUPP: 10.0000 mg | Freq: Every day | RECTAL | Status: DC | PRN

## 2019-03-10 MED ORDER — BUPIVACAINE LIPOSOME 1.3 % IJ SUSP
INTRAMUSCULAR | Status: DC | PRN
  Administered 2019-03-10 (×5): 2 mL via PERINEURAL

## 2019-03-10 MED ORDER — POTASSIUM CHLORIDE IN NACL 20-0.45 MEQ/L-% IV SOLN
INTRAVENOUS | Status: DC
  Administered 2019-03-10 – 2019-03-11 (×2): via INTRAVENOUS
  Filled 2019-03-10 (×2): qty 1000

## 2019-03-10 MED ORDER — DEXAMETHASONE SODIUM PHOSPHATE 10 MG/ML IJ SOLN
INTRAMUSCULAR | Status: DC | PRN
  Administered 2019-03-10: 10 mg via INTRAVENOUS

## 2019-03-10 MED ORDER — METHOCARBAMOL 500 MG IVPB - SIMPLE MED
500.0000 mg | Freq: Four times a day (QID) | INTRAVENOUS | Status: DC | PRN
  Administered 2019-03-10: 500 mg via INTRAVENOUS
  Filled 2019-03-10: qty 50

## 2019-03-10 MED ORDER — ONDANSETRON HCL 4 MG/2ML IJ SOLN
INTRAMUSCULAR | Status: DC | PRN
  Administered 2019-03-10: 4 mg via INTRAVENOUS

## 2019-03-10 MED ORDER — ACETAMINOPHEN 500 MG PO TABS
1000.0000 mg | ORAL_TABLET | Freq: Four times a day (QID) | ORAL | Status: AC
  Administered 2019-03-10 – 2019-03-11 (×4): 1000 mg via ORAL
  Filled 2019-03-10 (×4): qty 2

## 2019-03-10 SURGICAL SUPPLY — 59 items
BAG SPEC THK2 15X12 ZIP CLS (MISCELLANEOUS) ×1
BAG ZIPLOCK 12X15 (MISCELLANEOUS) ×2 IMPLANT
BASEPLATE GLENOSPHERE 25 (Plate) ×1 IMPLANT
BEARING HUMERAL SHLDER 36M STD (Shoulder) IMPLANT
BIT DRILL F/CENTRAL SCRW 3.2 (BIT) ×1
BIT DRILL F/CENTRAL SCRW 3.2MM (BIT) IMPLANT
BIT DRILL TWIST 2.7 (BIT) ×1 IMPLANT
BLADE SAW SAG 73X25 THK (BLADE) ×1
BLADE SAW SGTL 73X25 THK (BLADE) ×1 IMPLANT
BOOTIES KNEE HIGH SLOAN (MISCELLANEOUS) ×4 IMPLANT
BOWL SMART MIX CTS (DISPOSABLE) IMPLANT
BRNG HUM STD 36 RVRS SHLDR (Shoulder) ×1 IMPLANT
CLSR STERI-STRIP ANTIMIC 1/2X4 (GAUZE/BANDAGES/DRESSINGS) ×2 IMPLANT
COVER BACK TABLE 60X90IN (DRAPES) ×2 IMPLANT
COVER MAYO STAND STRL (DRAPES) ×2 IMPLANT
COVER SURGICAL LIGHT HANDLE (MISCELLANEOUS) ×2 IMPLANT
COVER WAND RF STERILE (DRAPES) IMPLANT
DECANTER SPIKE VIAL GLASS SM (MISCELLANEOUS) IMPLANT
DRAPE SHEET LG 3/4 BI-LAMINATE (DRAPES) ×4 IMPLANT
DRAPE SURG 17X11 SM STRL (DRAPES) ×2 IMPLANT
DRAPE U-SHAPE 47X51 STRL (DRAPES) ×2 IMPLANT
DRILL BIT F/CENTRAL SCRW 3.2MM (BIT) ×2
DRSG MEPILEX BORDER 4X8 (GAUZE/BANDAGES/DRESSINGS) ×2 IMPLANT
DURAPREP 26ML APPLICATOR (WOUND CARE) ×2 IMPLANT
ELECT REM PT RETURN 15FT ADLT (MISCELLANEOUS) ×2 IMPLANT
GLENOID SPHERE STD STRL 36MM (Orthopedic Implant) ×1 IMPLANT
GLOVE BIO SURGEON STRL SZ7.5 (GLOVE) ×2 IMPLANT
GLOVE BIO SURGEON STRL SZ8 (GLOVE) ×2 IMPLANT
GLOVE BIOGEL PI IND STRL 8 (GLOVE) ×2 IMPLANT
GLOVE BIOGEL PI INDICATOR 8 (GLOVE) ×2
GOWN STRL REUS W/TWL 2XL LVL3 (GOWN DISPOSABLE) ×2 IMPLANT
GOWN STRL REUS W/TWL LRG LVL3 (GOWN DISPOSABLE) ×2 IMPLANT
HOOD PEEL AWAY FLYTE STAYCOOL (MISCELLANEOUS) ×4 IMPLANT
KIT BASIN OR (CUSTOM PROCEDURE TRAY) ×2 IMPLANT
KIT TURNOVER KIT A (KITS) IMPLANT
PACK SHOULDER (CUSTOM PROCEDURE TRAY) ×2 IMPLANT
PIN STEINMANN THREADED TIP (PIN) ×1 IMPLANT
PROTECTOR NERVE ULNAR (MISCELLANEOUS) ×2 IMPLANT
SCREW BONE LOCKING 4.75X30X3.5 (Screw) ×3 IMPLANT
SCREW BONE STRL 6.5MMX30MM (Screw) ×1 IMPLANT
SCREW LOCKING 4.75MMX15MM (Screw) ×2 IMPLANT
SHOULDER HUMERAL BEAR 36M STD (Shoulder) ×2 IMPLANT
SLING ARM IMMOBILIZER LRG (SOFTGOODS) ×2 IMPLANT
STEM HUMERAL STRL 14MMX140MM (Stem) ×1 IMPLANT
SUCTION FRAZIER HANDLE 12FR (TUBING) ×1
SUCTION TUBE FRAZIER 12FR DISP (TUBING) ×1 IMPLANT
SUPPORT WRAP ARM LG (MISCELLANEOUS) ×2 IMPLANT
SUT FIBERWIRE #2 38 REV NDL BL (SUTURE) ×8
SUT VIC AB 0 CT1 36 (SUTURE) ×1 IMPLANT
SUT VIC AB 1 CT1 27 (SUTURE) ×2
SUT VIC AB 1 CT1 27XBRD ANTBC (SUTURE) ×1 IMPLANT
SUT VIC AB 2-0 CT1 27 (SUTURE) ×2
SUT VIC AB 2-0 CT1 TAPERPNT 27 (SUTURE) ×1 IMPLANT
SUT VIC AB 3-0 SH 8-18 (SUTURE) ×2 IMPLANT
SUTURE FIBERWR#2 38 REV NDL BL (SUTURE) ×4 IMPLANT
TOWEL OR 17X26 10 PK STRL BLUE (TOWEL DISPOSABLE) ×2 IMPLANT
TOWEL OR NON WOVEN STRL DISP B (DISPOSABLE) ×2 IMPLANT
TOWER CARTRIDGE SMART MIX (DISPOSABLE) IMPLANT
TRAY HUM REV SHOULDER STD +6 (Shoulder) ×1 IMPLANT

## 2019-03-10 NOTE — Transfer of Care (Signed)
Immediate Anesthesia Transfer of Care Note  Patient: Steve [name]  Procedure(s) Performed: REVERSE SHOULDER ARTHROPLASTY (Right )  Patient Location: PACU  Anesthesia Type:General  Level of Consciousness: awake, alert , oriented and patient cooperative  Airway & Oxygen Therapy: Patient Spontanous Breathing and Patient connected to face mask oxygen  Post-op Assessment: Report given to RN, Post -op Vital signs reviewed and stable and Patient moving all extremities  Post vital signs: Reviewed and stable  Last Vitals:  Vitals Value Taken Time  BP 148/83 03/10/2019  9:51 AM  Temp    Pulse 78 03/10/2019  9:53 AM  Resp 14 03/10/2019  9:53 AM  SpO2 100 % 03/10/2019  9:53 AM  Vitals shown include unvalidated device data.  Last Pain:  Vitals:   03/10/19 0607  TempSrc:   PainSc: 0-No pain      Patients Stated Pain Goal: 4 (91/22/58 3462)  Complications: No apparent anesthesia complications

## 2019-03-10 NOTE — Anesthesia Procedure Notes (Signed)
Anesthesia Regional Block: Interscalene brachial plexus block   Pre-Anesthetic Checklist: ,, timeout performed, Correct Patient, Correct Site, Correct Laterality, Correct Procedure, Correct Position, site marked, Risks and benefits discussed,  Surgical consent,  Pre-op evaluation,  At surgeon's request and post-op pain management  Laterality: Right and Upper  Prep: chloraprep       Needles:  Injection technique: Single-shot  Needle Type: Echogenic Stimulator Needle     Needle Length: 10cm  Needle Gauge: 21   Needle insertion depth: 1 cm   Additional Needles:   Procedures:,,,, ultrasound used (permanent image in chart),,,,  Narrative:  Start time: 03/10/2019 6:55 AM End time: 03/10/2019 7:10 AM Injection made incrementally with aspirations every 5 mL.  Performed by: Personally  Anesthesiologist: Lyn Hollingshead, MD

## 2019-03-10 NOTE — Anesthesia Procedure Notes (Signed)
Procedure Name: Intubation Date/Time: 03/10/2019 8:00 AM Performed by: Mitzie Na, CRNA Pre-anesthesia Checklist: Patient identified, Emergency Drugs available, Suction available, Patient being monitored and Timeout performed Patient Re-evaluated:Patient Re-evaluated prior to induction Oxygen Delivery Method: Circle system utilized Preoxygenation: Pre-oxygenation with 100% oxygen Induction Type: IV induction Ventilation: Oral airway inserted - appropriate to patient size Laryngoscope Size: Glidescope and 4 Grade View: Grade I Tube type: Oral Tube size: 7.5 mm Number of attempts: 2 Airway Equipment and Method: Stylet Placement Confirmation: ETT inserted through vocal cords under direct vision,  positive ETCO2 and breath sounds checked- equal and bilateral Secured at: 24 cm Tube secured with: Tape Dental Injury: Teeth and Oropharynx as per pre-operative assessment

## 2019-03-10 NOTE — Anesthesia Postprocedure Evaluation (Signed)
Anesthesia Post Note  Patient: Steve [name]  Procedure(s) Performed: REVERSE SHOULDER ARTHROPLASTY (Right )     Patient location during evaluation: PACU Anesthesia Type: General Level of consciousness: awake Pain management: pain level controlled Vital Signs Assessment: post-procedure vital signs reviewed and stable Respiratory status: spontaneous breathing Cardiovascular status: stable Postop Assessment: no apparent nausea or vomiting Anesthetic complications: no    Last Vitals:  Vitals:   03/10/19 1045 03/10/19 1100  BP: (!) 142/79 (!) 144/84  Pulse: 70 75  Resp: 10 10  Temp:    SpO2: 99% 100%    Last Pain:  Vitals:   03/10/19 1045  TempSrc:   PainSc: 0-No pain   Pain Goal: Patients Stated Pain Goal: 4 (03/10/19 0607)                 Huston Foley

## 2019-03-10 NOTE — Op Note (Signed)
03/10/2019  9:30 AM  PATIENT:  Steve [name]    PRE-OPERATIVE DIAGNOSIS: Right rotator cuff arthropathy  POST-OPERATIVE DIAGNOSIS:  Same  PROCEDURE:  Reverse Total Shoulder Arthroplasty  SURGEON:  Johnny Bridge, MD  PHYSICIAN ASSISTANT: Joya Gaskins, OPA-C, present and scrubbed throughout the case, critical for completion in a timely fashion, and for retraction, instrumentation, and closure.  ANESTHESIA:   General with Exparel interscalene block  ESTIMATED BLOOD LOSS: 100 mL  UNIQUE ASPECTS OF THE CASE: The biceps tendon was dislocated, the subscapularis was torn and retracted and not repairable.  The supraspinatus and infraspinatus were also not present.  The glenohumeral articular cartilage was still somewhat maintained, and there was minimal bone loss on the glenoid.  PREOPERATIVE INDICATIONS:  Steve [name] is a  68 y.o. male with a diagnosis of rotator cuff arthropathy of the right shoulder who failed conservative measures and elected for surgical management.    The risks benefits and alternatives were discussed with the patient preoperatively including but not limited to the risks of infection, bleeding, nerve injury, cardiopulmonary complications, the need for revision surgery, dislocation, brachial plexus palsy, incomplete relief of pain, among others, and the patient was willing to proceed.  OPERATIVE IMPLANTS: Biomet size 14 humeral stem press-fit standard with +6 40 mm reverse shoulder arthroplasty tray with a standard liner and a 36 mm glenosphere with a mini baseplate and 4 locking screws and one central nonlocking screw.  OPERATIVE FINDINGS: Advanced rotator cuff arthropathy with loss of subscapularis, supraspinatus, and infraspinatus.  The pectoralis tendon attachment was also very low, and not very well developed, I am not sure if he had a previous pectoralis disruption, there was not much tendon to sew the biceps tendon 2.  I did perform the tenodesis, sewing  the tendon to its sheath.  OPERATIVE PROCEDURE: The patient was brought to the operating room and placed in the supine position. General anesthesia was administered. IV antibiotics were given.  Time out was performed. The upper extremity was prepped and draped in usual sterile fashion. The patient was in a beachchair position. Deltopectoral approach was carried out. The biceps was tenodesed to the pectoralis tendon with #2 Fiberwire. The subscapularis was released off of the bone.  There was very little subscapularis present, only a couple of fibers still holding on at the inferior most aspect.  I then performed circumferential releases of the humerus, and then dislocated the head, and then reamed with the reamer to the above named size.  I then applied the jig, and cut the humeral head in 30 of retroversion, and then turned my attention to the glenoid.  Deep retractors were placed, and I resected the labrum, and then placed a guidepin into the center position on the glenoid, with slight inferior inclination. I then reamed over the guidepin, and this created a small metaphyseal cancellus blush inferiorly, removing just the cartilage to the subchondral bone superiorly. The base plate was selected and impacted place, and then I secured it centrally with a nonlocking screw, and I had excellent purchase both inferiorly and superiorly. I placed a short locking screws on anterior and posterior aspects.  I oriented the baseplate to get a superior screw into the base of the coracoid for excellent fixation, this was a 30.  I then turned my attention to the glenosphere, and impacted this into place, placing slight inferior offset (set on B).  I did have to remove a slight amount of bone off of the inferior lip to  minimize risk for impaction, I had a cancellus blush inferiorly, and had engaged the subchondral bone superiorly just barely.  The glenoid sphere was completely seated, and had engagement of the [facility]  taper. I then turned my attention back to the humerus.  I sequentially broached, and then trialed, and was found to restore soft tissue tension, and it had 2 finger tightness. Therefore the above named components were selected. The shoulder felt stable throughout functional motion.  Before I placed the real prosthesis I had also placed a total of 3 #2 FiberWire through drill holes in the humerus for later subscapularis repair.  I then impacted the real prosthesis into place, as well as the real humeral tray, and reduced the shoulder. The shoulder had excellent motion, and was stable, and I irrigated the wounds copiously.   I reassessed the mobility of the subscapularis, and it was not mobile enough for a repair, so I removed the FiberWire sutures.   I then irrigated the shoulder copiously once more, repaired the deltopectoral interval with Vicryl followed by subcutaneous Vicryl with Steri-Strips and sterile gauze for the skin. The patient was awakened and returned back in stable and satisfactory condition. There no complications and He tolerated the procedure well.

## 2019-03-10 NOTE — Discharge Instructions (Signed)
Diet: As you were doing prior to hospitalization   Shower:  May shower but keep the wounds dry, use an occlusive plastic wrap, NO SOAKING IN TUB.  If the bandage gets wet, change with a clean dry gauze.  If you have a splint on, leave the splint in place and keep the splint dry with a plastic bag.  Dressing:  You may change your dressing 3-5 days after surgery, unless you have a splint.  If you have a splint, then just leave the splint in place and we will change your bandages during your first follow-up appointment.    If you had hand or foot surgery, we will plan to remove your stitches in about 2 weeks in the office.  For all other surgeries, there are sticky tapes (steri-strips) on your wounds and all the stitches are absorbable.  Leave the steri-strips in place when changing your dressings, they will peel off with time, usually 2-3 weeks.  Activity:  Increase activity slowly as tolerated, but follow the weight bearing instructions below.  The rules on driving is that you can not be taking narcotics while you drive, and you must feel in control of the vehicle.    Weight Bearing:   Use sling at all times except for hygiene.    To prevent constipation: you may use a stool softener such as -  Colace (over the counter) 100 mg by mouth twice a day  Drink plenty of fluids (prune juice may be helpful) and high fiber foods Miralax (over the counter) for constipation as needed.    Itching:  If you experience itching with your medications, try taking only a single pain pill, or even half a pain pill at a time.  You may take up to 10 pain pills per day, and you can also use benadryl over the counter for itching or also to help with sleep.   Precautions:  If you experience chest pain or shortness of breath - call 911 immediately for transfer to the hospital emergency department!!  If you develop a fever greater that 101 F, purulent drainage from wound, increased redness or drainage from wound, or calf  pain -- Call the office at [phone]                                                Follow- Up Appointment:  Please call for an appointment to be seen in 2 weeks [place] - [phone]

## 2019-03-10 NOTE — H&P (Signed)
PREOPERATIVE H&P  Chief Complaint: right shoulder pain  HPI: Steve [name] is a 68 y.o. male who presents for preoperative history and physical with a diagnosis of right rotator cuff arthropathy. Symptoms are rated as moderate to severe, and have been worsening.  This is significantly impairing activities of daily living.  He has elected for surgical management.   He has failed injections, activity modification, anti-inflammatories, and assistive devices.  Preoperative X-rays demonstrate end stage degenerative changes with osteophyte formation, loss of joint space, subchondral sclerosis.   Past Medical History:  Diagnosis Date  . Arthritis    Right shoulder  . Benign hypertrophy of prostate    Dr.Dahlstedt  . Diverticulosis 2013   Moderate   . First degree AV block 12/09/2018   noted on EKG  . GERD (gastroesophageal reflux disease)   . Grade I diastolic dysfunction 78/67/6720   Noted on ECHO   . Hemifacial spasm   . History of loop recorder   . Hx of colonic polyp 2008 & 2013   X2  . Hyperlipidemia   . Internal hemorrhoids 2013  . PFO (patent foramen ovale)    History of   . Sleep apnea    Resolved  . Squamous cell skin cancer    Dr.King [place]  . Stricture esophagus 2013    dilation X 1  . Stroke [facility]) 2016   left semiovale stroke   . TIA (transient ischemic attack) 01/2015  . Ulcerative esophagitis 2013   Past Surgical History:  Procedure Laterality Date  . COLONOSCOPY W/ POLYPECTOMY  2013   tubular adenoma  . COLONOSCOPY W/ POLYPECTOMY  2008   hyperplastic  . EP IMPLANTABLE DEVICE N/A 01/03/2016   Procedure: Loop Recorder Insertion;  Surgeon: Sanda Klein, MD;  Location: [place] [facility];  Service: Cardiovascular;  Laterality: N/A;  . MOHS SURGERY     x 3  . NASAL SINUS SURGERY    . PATENT FORAMEN OVALE(PFO) CLOSURE N/A 03/27/2018   Procedure: PATENT FORAMEN OVALE (PFO) CLOSURE;  Surgeon: Sherren Mocha, MD;  Location: [place] [facility];   Service: Cardiovascular;  Laterality: N/A;  . ROTATOR CUFF REPAIR Left   . TEE WITHOUT CARDIOVERSION N/A 01/06/2018   Procedure: TRANSESOPHAGEAL ECHOCARDIOGRAM (TEE);  Surgeon: Sanda Klein, MD;  Location: [facility];  Service: Cardiovascular;  Laterality: N/A;  . torn biceps tendon     surgical repair  . uvulectomy & tonsillectomy  2008   Dr Wilburn Cornelia   Social History   Socioeconomic History  . Marital status: Married    Spouse name: Not on file  . Number of children: Not on file  . Years of education: Not on file  . Highest education level: Not on file  Occupational History  . 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 Financial resource strain: Not on file  . Food insecurity:    Worry: Not on file    Inability: Not on file  . Transportation needs:    Medical: Not on file    Non-medical: Not on file  Tobacco Use  . Smoking status: Former Smoker    Packs/day: 0.50    Years: 10.00    Pack years: 5.00    Types: Cigarettes    Last attempt to quit: 1983    Years since quitting: 37.4  . Smokeless tobacco: Never Used  . Tobacco comment: smoked 1972-1982, up to 1/2 ppd  Substance and Sexual Activity  . Alcohol use: Yes    Alcohol/week: 5.0 -  6.0 standard drinks    Types: 5 - 6 Standard drinks or equivalent per week    Comment: 3-4 drinks per week  . Drug use: No  . Sexual activity: Not on file  Lifestyle  . Physical activity:    Days per week: Not on file    Minutes per session: Not on file  . Stress: Not on file  Relationships  . Social connections:    Talks on phone: Not on file    Gets together: Not on file    Attends religious service: Not on file    Active member of club or organization: Not on file    Attends meetings of clubs or organizations: Not on file    Relationship status: Not on file  Other Topics Concern  . Not on file  Social History Narrative   GETS REG EXERCISE 4x weekly   NO DIET   Lives with wife in a 2 story home.  Has 2  children.     Works as a Insurance underwriter.     Education: college      Family History  Problem Relation Age of Onset  . Uterine cancer Mother   . Breast cancer Mother        Deceased, 14  . Stroke Father        > 29  . Prostate cancer Father        Deceased, 32  . Diabetes Daughter        type 1  . Stroke Paternal Uncle        > 55  . Heart attack Maternal Grandfather         >55/mid-60s  . Hyperlipidemia Son   . Healthy Brother   . Colon cancer Neg Hx   . Rectal cancer Neg Hx   . Stomach cancer Neg Hx   . Esophageal cancer Neg Hx    Allergies  Allergen Reactions  . Pollen Extract    Prior to Admission medications   Medication Sig Start Date End Date Taking? Authorizing Provider  ALPRAZolam Duanne Moron) 0.5 MG tablet Take 1 tablet (0.5 mg total) by mouth at bedtime as needed for sleep. 02/20/19  Yes Burns, Claudina Lick, MD  aspirin 81 MG tablet Take 81 mg by mouth daily.     Yes [provider]  cholecalciferol (VITAMIN D3) 25 MCG (1000 UT) tablet Take 1,000 Units by mouth daily.   Yes [provider]  ibuprofen (ADVIL) 200 MG tablet Take 400-600 mg by mouth every 6 (six) hours as needed.   Yes [provider]  Multiple Vitamins-Minerals (PRESERVISION AREDS 2) CAPS Take 1 capsule by mouth 2 (two) times a day.   Yes [provider]  pantoprazole (PROTONIX) 40 MG tablet Take 1 tablet (40 mg total) by mouth daily. 03/07/18  Yes Sherren Mocha, MD  Polyethyl Glycol-Propyl Glycol (SYSTANE OP) Place 2 drops into both eyes at bedtime.   Yes [provider]  simvastatin (ZOCOR) 40 MG tablet TAKE ONE TABLET AT BEDTIME 09/26/18  Yes Burns, Claudina Lick, MD  traMADol (ULTRAM) 50 MG tablet Take 50 mg by mouth every 6 (six) hours as needed (pain).    Yes [provider]  vitamin B-12 (CYANOCOBALAMIN) 1000 MCG tablet Take 1,000 mcg by mouth daily.   Yes [provider]  vitamin C (ASCORBIC ACID) 500 MG tablet Take 500 mg by mouth daily.   Yes  [provider]  fluticasone (FLONASE) 50 MCG/ACT nasal spray ONE SPRAY IN [facility]  NOSTRIL TWICE DAILY ASNEEDED -USE CROSSOVER TECHNIQUE DISCUSSED Patient taking differently: Place 1 spray into both nostrils daily as needed for allergies.  09/26/18   Binnie Rail, MD  vardenafil (LEVITRA) 20 MG tablet Take 20 mg by mouth daily as needed for erectile dysfunction.     [provider]     Positive ROS: All other systems have been reviewed and were otherwise negative with the exception of those mentioned in the HPI and as above.  Physical Exam: General: Alert, no acute distress Cardiovascular: No pedal edema Respiratory: No cyanosis, no use of accessory musculature GI: No organomegaly, abdomen is soft and non-tender Skin: No lesions in the area of chief complaint Neurologic: Sensation intact distally Psychiatric: Patient is competent for consent with normal mood and affect Lymphatic: No axillary or cervical lymphadenopathy  MUSCULOSKELETAL: right shoulder arom 0-70 with crepitance and weakness and pain  Assessment: Right rotator cuff arthropathy   Plan: Plan for Procedure(s): REVERSE SHOULDER ARTHROPLASTY  The risks benefits and alternatives were discussed with the patient including but not limited to the risks of nonoperative treatment, versus surgical intervention including infection, bleeding, nerve injury,  blood clots, cardiopulmonary complications, morbidity, mortality, among others, and they were willing to proceed.   Anticipated LOS equal to or greater than 2 midnights due to - Age 59 and older with one or more of the following:  - Obesity  - Expected need for hospital services (PT, OT, Nursing) required for safe  discharge  - Anticipated need for postoperative skilled nursing care or inpatient rehab  - Active co-morbidities: None    Johnny Bridge, MD Cell [phone]   03/10/2019 7:22 AM

## 2019-03-11 ENCOUNTER — Telehealth: Payer: Self-pay | Admitting: *Deleted

## 2019-03-11 ENCOUNTER — Encounter (HOSPITAL_COMMUNITY): Payer: Self-pay | Admitting: Orthopedic Surgery

## 2019-03-11 LAB — CBC
HCT: 39.8 % (ref 39.0–52.0)
Hemoglobin: 13.1 g/dL (ref 13.0–17.0)
MCH: 31.6 pg (ref 26.0–34.0)
MCHC: 32.9 g/dL (ref 30.0–36.0)
MCV: 96.1 fL (ref 80.0–100.0)
Platelets: 160 10*3/uL (ref 150–400)
RBC: 4.14 MIL/uL — ABNORMAL LOW (ref 4.22–5.81)
RDW: 12.7 % (ref 11.5–15.5)
WBC: 10.4 10*3/uL (ref 4.0–10.5)
nRBC: 0 % (ref 0.0–0.2)

## 2019-03-11 LAB — BASIC METABOLIC PANEL
Anion gap: 6 (ref 5–15)
BUN: 16 mg/dL (ref 8–23)
CO2: 24 mmol/L (ref 22–32)
Calcium: 8 mg/dL — ABNORMAL LOW (ref 8.9–10.3)
Chloride: 104 mmol/L (ref 98–111)
Creatinine, Ser: 0.76 mg/dL (ref 0.61–1.24)
GFR calc Af Amer: >60 mL/min (ref >60)
GFR calc non Af Amer: >60 mL/min (ref >60)
Glucose, Bld: 133 mg/dL — ABNORMAL HIGH (ref 70–99)
Potassium: 5.1 mmol/L (ref 3.5–5.1)
Sodium: 134 mmol/L — ABNORMAL LOW (ref 135–145)

## 2019-03-11 NOTE — Progress Notes (Addendum)
Patient ID: Steve [name], male   DOB: 10-Jul-1951, 68 y.o.   MRN: [id number]     Subjective:  Patient reports pain as mild to moderate.  Patient in no acute distress.  States that he is ready to go home  Objective:   VITALS:   Vitals:   03/10/19 1754 03/10/19 2200 03/11/19 0200 03/11/19 0652  BP: 139/82 128/77 118/70 (!) 142/70  Pulse: 77 72 66   Resp: 16 16 16 14   Temp: 97.9 F (36.6 C) 97.9 F (36.6 C) (!) 97.5 F (36.4 C) 97.8 F (36.6 C)  TempSrc: Oral Oral Oral Oral  SpO2: 98% 97% 99% 97%  Weight:      Height:        ABD soft Sensation intact distally Dorsiflexion/Plantar flexion intact Incision: dressing C/D/I and no drainage   Lab Results  Component Value Date   WBC 10.4 03/11/2019   HGB 13.1 03/11/2019   HCT 39.8 03/11/2019   MCV 96.1 03/11/2019   PLT 160 03/11/2019   BMET    Component Value Date/Time   NA 134 (L) 03/11/2019 0339   NA 137 03/19/2018 0901   K 5.1 03/11/2019 0339   CL 104 03/11/2019 0339   CO2 24 03/11/2019 0339   GLUCOSE 133 (H) 03/11/2019 0339   BUN 16 03/11/2019 0339   BUN 15 03/19/2018 0901   CREATININE 0.76 03/11/2019 0339   CALCIUM 8.0 (L) 03/11/2019 0339   GFRNONAA >60 03/11/2019 0339   GFRAA >60 03/11/2019 0339     Assessment/Plan: 1 Day Post-Op   Principal Problem:   Right rotator cuff tear arthropathy Active Problems:   S/P reverse total shoulder arthroplasty, right   Advance diet Up with therapy DC home Sling at all times NWB right upper  Follow up as scheduled with Dr Mardelle Matte MD    Lunette Stands 03/11/2019, 8:16 AM  Discussed and agree with above.   Marchia Bond, MD Cell [phone]   '

## 2019-03-11 NOTE — Telephone Encounter (Signed)
Pt was on TCM report admitted 03/10/19 for Right rotator cuff tear arthropathy. Pt underwent a REVERSE SHOULDER ARTHROPLASTY  Procedure, and was  D/C 03/11/19. Pt will follow-up w/surgeon Anell Barr in 2 weeks. No need to have TCM follow=up w/PCP.Steve KitchenJohny Chess

## 2019-03-11 NOTE — Discharge Summary (Signed)
Physician Discharge Summary  Patient ID: Steve [name] MRN: [id number] DOB/AGE: [birth year] 69 y.o.  Admit date: 03/10/2019 Discharge date: 03/11/2019  Admission Diagnoses:  Right rotator cuff tear arthropathy  Discharge Diagnoses:  Principal Problem:   Right rotator cuff tear arthropathy Active Problems:   S/P reverse total shoulder arthroplasty, right   Past Medical History:  Diagnosis Date  . Arthritis    Right shoulder  . Benign hypertrophy of prostate    Dr.Dahlstedt  . Diverticulosis 2013   Moderate   . First degree AV block 12/09/2018   noted on EKG  . GERD (gastroesophageal reflux disease)   . Grade I diastolic dysfunction 54/62/7035   Noted on ECHO   . Hemifacial spasm   . History of loop recorder   . Hx of colonic polyp 2008 & 2013   X2  . Hyperlipidemia   . Internal hemorrhoids 2013  . PFO (patent foramen ovale)    History of   . 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  . Sleep apnea    Resolved  . Squamous cell skin cancer    Dr.King [place]  . Stricture esophagus 2013    dilation X 1  . Stroke [facility]) 2016   left semiovale stroke   . TIA (transient ischemic attack) 01/2015  . Ulcerative esophagitis 2013    Surgeries: Procedure(s): REVERSE SHOULDER ARTHROPLASTY on 03/10/2019   Consultants (if any):   Discharged Condition: Improved  Hospital Course: Steve [name] is an 68 y.o. male who was admitted 03/10/2019 with a diagnosis of Right rotator cuff tear arthropathy and went to the operating room on 03/10/2019 and underwent the above named procedures.    He was given perioperative antibiotics:  Anti-infectives (From admission, onward)   Start     Dose/Rate Route Frequency Ordered Stop   03/10/19 1400  ceFAZolin (ANCEF) IVPB 1 g/50 mL premix     1 g 100 mL/hr over 30 Minutes Intravenous Every 6 hours 03/10/19 1202 03/11/19 0313   03/10/19 0600  ceFAZolin (ANCEF) IVPB 2g/100 mL premix     2 g 200 mL/hr over 30 Minutes Intravenous On call  to O.R. 03/10/19 0093 03/10/19 0754    .  He was given sequential compression devices, early ambulation, for DVT prophylaxis.  Block was still working at discharge.  He benefited maximally from the hospital stay and there were no complications.    Recent vital signs:  Vitals:   03/11/19 0200 03/11/19 0652  BP: 118/70 (!) 142/70  Pulse: 66   Resp: 16 14  Temp: (!) 97.5 F (36.4 C) 97.8 F (36.6 C)  SpO2: 99% 97%    Recent laboratory studies:  Lab Results  Component Value Date   HGB 13.1 03/11/2019   HGB 14.9 03/04/2019   HGB 15.5 08/07/2018   Lab Results  Component Value Date   WBC 10.4 03/11/2019   PLT 160 03/11/2019   Lab Results  Component Value Date   INR 1.1 12/31/2017   Lab Results  Component Value Date   NA 134 (L) 03/11/2019   K 5.1 03/11/2019   CL 104 03/11/2019   CO2 24 03/11/2019   BUN 16 03/11/2019   CREATININE 0.76 03/11/2019   GLUCOSE 133 (H) 03/11/2019    Discharge Medications:   Allergies as of 03/11/2019      Reactions   Pollen Extract       Medication List    TAKE these medications   ALPRAZolam 0.5 MG tablet Commonly known as:  Xanax  Take 1 tablet (0.5 mg total) by mouth at bedtime as needed for sleep.   aspirin 81 MG tablet Take 81 mg by mouth daily.   baclofen 10 MG tablet Commonly known as:  LIORESAL Take 1 tablet (10 mg total) by mouth 3 (three) times daily. As needed for muscle spasm   cholecalciferol 25 MCG (1000 UT) tablet Commonly known as:  VITAMIN D3 Take 1,000 Units by mouth daily.   fluticasone 50 MCG/ACT nasal spray Commonly known as:  FLONASE ONE SPRAY IN EACH NOSTRIL TWICE DAILY ASNEEDED -USE CROSSOVER TECHNIQUE DISCUSSED What changed:  See the new instructions.   ibuprofen 200 MG tablet Commonly known as:  ADVIL Take 400-600 mg by mouth every 6 (six) hours as needed.   ondansetron 4 MG tablet Commonly known as:  Zofran Take 1 tablet (4 mg total) by mouth every 8 (eight) hours as needed for nausea or  vomiting.   oxyCODONE 5 MG immediate release tablet Commonly known as:  Roxicodone Take 1 tablet (5 mg total) by mouth every 4 (four) hours as needed for severe pain.   pantoprazole 40 MG tablet Commonly known as:  PROTONIX Take 1 tablet (40 mg total) by mouth daily.   PreserVision AREDS 2 Caps Take 1 capsule by mouth 2 (two) times a day.   sennosides-docusate sodium 8.6-50 MG tablet Commonly known as:  SENOKOT-S Take 2 tablets by mouth daily.   simvastatin 40 MG tablet Commonly known as:  ZOCOR TAKE ONE TABLET AT BEDTIME   SYSTANE OP Place 2 drops into both eyes at bedtime.   Ultram 50 MG tablet Generic drug:  traMADol Take 50 mg by mouth every 6 (six) hours as needed (pain).   vardenafil 20 MG tablet Commonly known as:  LEVITRA Take 20 mg by mouth daily as needed for erectile dysfunction.   vitamin B-12 1000 MCG tablet Commonly known as:  CYANOCOBALAMIN Take 1,000 mcg by mouth daily.   vitamin C 500 MG tablet Commonly known as:  ASCORBIC ACID Take 500 mg by mouth daily.       Diagnostic Studies: Dg Shoulder Right Port  Result Date: 03/10/2019 CLINICAL DATA:  Status post reversed total shoulder arthroplasty. EXAM: PORTABLE RIGHT SHOULDER COMPARISON:  MRI 08/20/2005 FINDINGS: The scapular and humeral components appear well seated. No complicating features are identified. The visualized right lung is clear. IMPRESSION: Well seated components of a reversed total shoulder arthroplasty. Electronically Signed   By: Marijo Sanes M.D.   On: 03/10/2019 12:12    Disposition: Discharge disposition: 01-Home or Self Care         Follow-up Information    Marchia Bond, MD. Schedule an appointment as soon as possible for a visit in 2 weeks.   Specialty:  Orthopedic Surgery Contact information: [address] [place] [address] [phone]            Signed: Johnny Bridge 03/11/2019, 9:12 AM

## 2019-03-11 NOTE — Evaluation (Signed)
Occupational Therapy Evaluation Patient Details Name: Steve [name] MRN: [id number] DOB: 14-Sep-1951 Today's Date: 03/11/2019    History of Present Illness s/p R RTSA   Clinical Impression   This 68 year old man was admitted for the above sx. All education was completed. He will follow up with Dr Mardelle Matte for further rehab needs.    Follow Up Recommendations  Follow surgeon's recommendation for DC plan and follow-up therapies    Equipment Recommendations  None recommended by OT    Recommendations for Other Services       Precautions / Restrictions Precautions Precautions: Shoulder Type of Shoulder Precautions: sling on at all times except bathing/dressing and exercises.  AROM of elbow, wrist and hand.  No shoulder ROM Shoulder Interventions: Shoulder sling/immobilizer Precaution Booklet Issued: Yes (comment) Restrictions Weight Bearing Restrictions: Yes Other Position/Activity Restrictions: NWB      Mobility Bed Mobility Overal bed mobility: Modified Independent(HOB raised)             General bed mobility comments: pt plans to sleep in recliner  Transfers Overall transfer level: Independent                    Balance                                           ADL either performed or assessed with clinical judgement   ADL Overall ADL's : Needs assistance/impaired Eating/Feeding: Set up   Grooming: Set up   Upper Body Bathing: Moderate assistance   Lower Body Bathing: Moderate assistance   Upper Body Dressing : Moderate assistance   Lower Body Dressing: Moderate assistance   Toilet Transfer: Independent   Toileting- Clothing Manipulation and Hygiene: Independent         General ADL Comments: performed ADL and pt used toilet on his own.  Reviewed protocol and handouts given     Vision         Perception     Praxis      Pertinent Vitals/Pain Pain Assessment: (tingling in R hand; block still in effect)      Hand Dominance Right   Extremity/Trunk Assessment Upper Extremity Assessment Upper Extremity Assessment: RUE deficits/detail(movement in R hand; block in effect)           Communication Communication Communication: No difficulties   Cognition Arousal/Alertness: Awake/alert Behavior During Therapy: WFL for tasks assessed/performed Overall Cognitive Status: Within Functional Limits for tasks assessed                                     General Comments       Exercises     Shoulder Instructions      Home Living Family/patient expects to be discharged to:: Private residence Living Arrangements: Spouse/significant other Available Help at Discharge: Family               Bathroom Shower/Tub: Walk-in shower             Additional Comments: pt moving independently; does not need RUE to get up      Prior Functioning/Environment Level of Independence: Independent                 OT Problem List:        OT Treatment/Interventions:  OT Goals(Current goals can be found in the care plan section) Acute Rehab OT Goals Patient Stated Goal: return to independence; decreased pain OT Goal Formulation: All assessment and education complete, DC therapy  OT Frequency:     Barriers to D/C:            Co-evaluation              AM-PAC OT "6 Clicks" Daily Activity     Outcome Measure Help from another person eating meals?: A Little Help from another person taking care of personal grooming?: A Little Help from another person toileting, which includes using toliet, bedpan, or urinal?: None Help from another person bathing (including washing, rinsing, drying)?: A Lot Help from another person to put on and taking off regular upper body clothing?: A Lot Help from another person to put on and taking off regular lower body clothing?: A Lot 6 Click Score: 16   End of Session Nurse Communication: (OT completed)  Activity Tolerance:  Patient tolerated treatment well Patient left: in chair;with call bell/phone within reach  OT Visit Diagnosis: Pain;Muscle weakness (generalized) (M62.81) Pain - Right/Left: Right Pain - part of body: Shoulder                Time: 3716-9678 OT Time Calculation (min): 36 min Charges:  OT General Charges $OT Visit: 1 Visit OT Evaluation $OT Eval Low Complexity: 1 Low OT Treatments $Self Care/Home Management : 8-22 mins  Lesle Chris, OTR/L Acute Rehabilitation Services [phone] [facility] pager [phone] office 03/11/2019  Steve [name] 03/11/2019, 9:45 AM

## 2019-03-12 ENCOUNTER — Encounter: Payer: Self-pay | Admitting: Internal Medicine

## 2019-03-13 ENCOUNTER — Telehealth: Payer: Self-pay

## 2019-03-13 MED ORDER — ALPRAZOLAM 1 MG PO TABS: 1.0000 mg | ORAL_TABLET | Freq: Every evening | ORAL | 1 refills | Status: DC | PRN

## 2019-03-13 NOTE — Telephone Encounter (Signed)
Sent 1 mg

## 2019-03-13 NOTE — Telephone Encounter (Signed)
Patient requesting refill on xanax. Last refill was 02/20/19. His prescription has been increased to 2 tablets at night. Do you want to change rx to 1 mg?

## 2019-03-19 ENCOUNTER — Telehealth: Payer: Self-pay | Admitting: Physician Assistant

## 2019-03-19 NOTE — Telephone Encounter (Signed)
New Message:     Plt would like for you to call him please. I think this is concrning his 03-25-19 appt.

## 2019-03-19 NOTE — Telephone Encounter (Signed)
This has been taken care of     Thank you~

## 2019-03-24 ENCOUNTER — Other Ambulatory Visit (HOSPITAL_COMMUNITY): Payer: 59

## 2019-03-25 ENCOUNTER — Telehealth: Payer: 59 | Admitting: Physician Assistant

## 2019-03-27 ENCOUNTER — Other Ambulatory Visit: Payer: Self-pay | Admitting: Internal Medicine

## 2019-04-01 ENCOUNTER — Telehealth: Payer: 59 | Admitting: Physician Assistant

## 2019-04-23 ENCOUNTER — Encounter: Payer: Self-pay | Admitting: Internal Medicine

## 2019-04-23 MED ORDER — PANTOPRAZOLE SODIUM 40 MG PO TBEC: 40.0000 mg | DELAYED_RELEASE_TABLET | Freq: Every day | ORAL | 11 refills | Status: DC

## 2019-04-30 ENCOUNTER — Other Ambulatory Visit (HOSPITAL_COMMUNITY): Payer: 59

## 2019-05-06 ENCOUNTER — Telehealth: Payer: 59 | Admitting: Physician Assistant

## 2019-05-12 ENCOUNTER — Other Ambulatory Visit: Payer: Self-pay

## 2019-05-12 ENCOUNTER — Telehealth: Payer: Self-pay

## 2019-05-12 ENCOUNTER — Ambulatory Visit (HOSPITAL_COMMUNITY): Payer: 59 | Attending: Cardiovascular Disease

## 2019-05-12 DIAGNOSIS — Z8774 Personal history of (corrected) congenital malformations of heart and circulatory system: Secondary | ICD-10-CM | POA: Insufficient documentation

## 2019-05-12 NOTE — Telephone Encounter (Signed)
Attempted to reach pt in regards to virtual visit with Nell Range, PA on 8/6. Need consent and to clarify phone or video visit

## 2019-05-13 ENCOUNTER — Telehealth: Payer: Self-pay

## 2019-05-13 NOTE — Telephone Encounter (Signed)
Spoke with pt and obtained verbal consent for a video visit. Pt would like the link sent to his email address. Pt does not have the capability to take his BP at home but will have his weight ready prior to appt. Verified medications, pharmacy, and appt time with pt.   YOUR CARDIOLOGY TEAM HAS ARRANGED FOR AN E-VISIT FOR YOUR APPOINTMENT - PLEASE REVIEW IMPORTANT INFORMATION BELOW SEVERAL DAYS PRIOR TO YOUR APPOINTMENT  Due to the recent COVID-19 pandemic, we are transitioning in-person office visits to tele-medicine visits in an effort to decrease unnecessary exposure to our patients, their families, and staff. These visits are billed to your insurance just like a normal visit is. We also encourage you to sign up for MyChart if you have not already done so. You will need a smartphone if possible. For patients that do not have this, we can still complete the visit using a regular telephone but do prefer a smartphone to enable video when possible. You may have a family member that lives with you that can help. If possible, we also ask that you have a blood pressure cuff and scale at home to measure your blood pressure, heart rate and weight prior to your scheduled appointment. Patients with clinical needs that need an in-person evaluation and testing will still be able to come to the office if absolutely necessary. If you have any questions, feel free to call our office.     YOUR PROVIDER WILL BE USING THE FOLLOWING PLATFORM TO COMPLETE YOUR VISIT: [email or web address]  . IF USING MYCHART - How to Download the MyChart App to Your SmartPhone   - If Apple, go to [employer] and type in MyChart in the search bar and download the app. If Android, ask patient to go to [employer] and type in [place] in the search bar and download the app. The app is free but as with any other app downloads, your phone may require you to verify saved payment information or Apple/Android password.  - You will need to then log into the  app with your MyChart username and password, and select [place] as your healthcare provider to link the account.  - When it is time for your visit, go to the MyChart app, find appointments, and click Begin Video Visit. Be sure to Select Allow for your device to access the Microphone and Camera for your visit. You will then be connected, and your provider will be with you shortly.  **If you have any issues connecting or need assistance, please contact MyChart service desk [phone])**  **If using a computer, in order to ensure the best quality for your visit, you will need to use either of the following Internet Browsers: Insurance underwriter or [employer]**  . IF USING DOXIMITY or [email or web address].ME - The staff will give you instructions on receiving your link to join the meeting the day of your visit.      2-3 DAYS BEFORE YOUR APPOINTMENT  You will receive a telephone call from one of our [place] team members - your caller ID may say "Unknown caller." If this is a video visit, we will walk you through how to get the video launched on your phone. We will remind you check your blood pressure, heart rate and weight prior to your scheduled appointment. If you have an Apple Watch or Kardia, please upload any pertinent ECG strips the day before or morning of your appointment to [place]. Our staff will also make sure you  have reviewed the consent and agree to move forward with your scheduled tele-health visit.     THE DAY OF YOUR APPOINTMENT  Approximately 15 minutes prior to your scheduled appointment, you will receive a telephone call from one of [facility] team - your caller ID may say "Unknown caller."  Our staff will confirm medications, vital signs for the day and any symptoms you may be experiencing. Please have this information available prior to the time of visit start. It may also be helpful for you to have a pad of paper and pen handy for any instructions given during your visit. They  will also walk you through joining the smartphone meeting if this is a video visit.    CONSENT FOR TELE-HEALTH VISIT - PLEASE REVIEW  I hereby voluntarily request, consent and authorize [facility] and its employed or contracted physicians, physician assistants, nurse practitioners or other licensed health care professionals (the Practitioner), to provide me with telemedicine health care services (the "Services") as deemed necessary by the treating Practitioner. I acknowledge and consent to receive the Services by the Practitioner via telemedicine. I understand that the telemedicine visit will involve communicating with the Practitioner through live audiovisual communication technology and the disclosure of certain medical information by electronic transmission. I acknowledge that I have been given the opportunity to request an in-person assessment or other available alternative prior to the telemedicine visit and am voluntarily participating in the telemedicine visit.  I understand that I have the right to withhold or withdraw my consent to the use of telemedicine in the course of my care at any time, without affecting my right to future care or treatment, and that the Practitioner or I may terminate the telemedicine visit at any time. I understand that I have the right to inspect all information obtained and/or recorded in the course of the telemedicine visit and may receive copies of available information for a reasonable fee.  I understand that some of the potential risks of receiving the Services via telemedicine include:  Marland Kitchen Delay or interruption in medical evaluation due to technological equipment failure or disruption; . Information transmitted may not be sufficient (e.g. poor resolution of images) to allow for appropriate medical decision making by the Practitioner; and/or  . In rare instances, security protocols could fail, causing a breach of personal health information.  Furthermore, I  acknowledge that it is my responsibility to provide information about my medical history, conditions and care that is complete and accurate to the best of my ability. I acknowledge that Practitioner's advice, recommendations, and/or decision may be based on factors not within their control, such as incomplete or inaccurate data provided by me or distortions of diagnostic images or specimens that may result from electronic transmissions. I understand that the practice of medicine is not an exact science and that Practitioner makes no warranties or guarantees regarding treatment outcomes. I acknowledge that I will receive a copy of this consent concurrently upon execution via email to the email address I last provided but may also request a printed copy by calling the office of [facility].    I understand that my insurance will be billed for this visit.   I have read or had this consent read to me. . I understand the contents of this consent, which adequately explains the benefits and risks of the Services being provided via telemedicine.  . I have been provided ample opportunity to ask questions regarding this consent and the Services and have had my questions  answered to my satisfaction. . I give my informed consent for the services to be provided through the use of telemedicine in my medical care  By participating in this telemedicine visit I agree to the above.

## 2019-05-14 ENCOUNTER — Telehealth (INDEPENDENT_AMBULATORY_CARE_PROVIDER_SITE_OTHER): Payer: 59 | Admitting: Physician Assistant

## 2019-05-14 ENCOUNTER — Other Ambulatory Visit: Payer: Self-pay

## 2019-05-14 VITALS — Ht 74.0 in | Wt 195.0 lb

## 2019-05-14 DIAGNOSIS — Z8774 Personal history of (corrected) congenital malformations of heart and circulatory system: Secondary | ICD-10-CM | POA: Diagnosis not present

## 2019-05-14 NOTE — Patient Instructions (Signed)
Medication Instructions:  Your physician recommends that you continue on your current medications as directed. Please refer to the Current Medication list given to you today.  If you need a refill on your cardiac medications before your next appointment, please call your pharmacy.   Lab work: none If you have labs (blood work) drawn today and your tests are completely normal, you will receive your results only by: Marland Kitchen MyChart Message (if you have MyChart) OR . A paper copy in the mail If you have any lab test that is abnormal or we need to change your treatment, we will call you to review the results.  Testing/Procedures: none  Follow-Up: Your physician recommends that you schedule a follow-up appointment as needed.

## 2019-05-14 NOTE — Progress Notes (Addendum)
[facility]   MULTIDISCIPLINARY HEART VALVE TEAM   Virtual Visit via Video Note   This visit type was conducted due to national recommendations for restrictions regarding the COVID-19 Pandemic (e.g. social distancing) in an effort to limit this patient's exposure and mitigate transmission in our community.  Due to his co-morbid illnesses, this patient is at least at moderate risk for complications without adequate follow up.  This format is felt to be most appropriate for this patient at this time.  All issues noted in this document were discussed and addressed.  A limited physical exam was performed with this format.  Please refer to the patient's chart for his consent to telehealth for [facility].   Evaluation Performed:  Follow-up visit  Date:  05/14/2019   ID:  Steve [name], DOB Jul 09, 1951, MRN [id number]  Patient Location: Home Provider Location: Office  PCP:  Binnie Rail, MD  Cardiologist:  Dr. Sallyanne Kuster  Chief Complaint:  1 year s/p PFO closure   History of Present Illness:    Steve [name] is a 68 y.o. 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   He woke up in 2016 with right arm numbness but didn't pay much attention to it.  He also noted that his fine motor skills were abnormal such as his writing. His symptoms fully resolved. 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  He was evaluated by Dr. Burt Knack for PFO closure. Dr. Burt Knack felt like PFO closure was indicated given large PFO with atrial septal aneurysm. He underwent succesful PFO  closure on 03/27/18 with a 25 mm Amplatzaer PFO occluder device. He was placed on ASA and plavix x 3 month s and then continued on aspirin alone.   He has done quite well and recently underwent shoulder replacement in 03/2019. Today he presents via video conferencing for follow up. No CP or SOB. No LE edema, orthopnea or PND. No dizziness or syncope. No blood in stool or urine. No palpitations. He is still exercising but this has slowed down since the pandemic. He has been walking outside 2 miles a day. Wants to start virtual yoga again when shoulder heals. Eager to get back to golf but has to wait a little while longer.    Past Medical History:  Diagnosis Date  . Arthritis    Right shoulder  . Benign hypertrophy of prostate    Dr.Dahlstedt  . Diverticulosis 2013   Moderate   . First degree AV block 12/09/2018   noted on EKG  . GERD (gastroesophageal reflux disease)   . Grade I diastolic dysfunction 54/65/0354   Noted on ECHO   . Hemifacial spasm   . History of loop recorder   . Hx of colonic polyp 2008 & 2013   X2  . Hyperlipidemia   . Internal hemorrhoids 2013  . PFO (patent foramen ovale)    History of   . 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  . Sleep apnea    Resolved  . Squamous cell skin cancer    Dr.King [place]  . Stricture esophagus 2013    dilation X 1  . Stroke [facility]) 2016   left semiovale stroke   .  TIA (transient ischemic attack) 01/2015  . Ulcerative esophagitis 2013   Past Surgical History:  Procedure Laterality Date  . COLONOSCOPY W/ POLYPECTOMY  2013   tubular adenoma  . COLONOSCOPY W/ POLYPECTOMY  2008   hyperplastic  . EP IMPLANTABLE DEVICE N/A 01/03/2016   Procedure: Loop Recorder Insertion;  Surgeon: Sanda Klein, MD;  Location: [place] [facility];  Service: Cardiovascular;  Laterality: N/A;  . MOHS SURGERY     x 3  . NASAL SINUS SURGERY    . PATENT FORAMEN OVALE(PFO) CLOSURE N/A 03/27/2018   Procedure: PATENT FORAMEN OVALE (PFO) CLOSURE;   Surgeon: Sherren Mocha, MD;  Location: [place] [facility];  Service: Cardiovascular;  Laterality: N/A;  . REVERSE SHOULDER ARTHROPLASTY Right 03/10/2019   Procedure: REVERSE SHOULDER ARTHROPLASTY;  Surgeon: Marchia Bond, MD;  Location: [facility];  Service: Orthopedics;  Laterality: Right;  . ROTATOR CUFF REPAIR Left   . TEE WITHOUT CARDIOVERSION N/A 01/06/2018   Procedure: TRANSESOPHAGEAL ECHOCARDIOGRAM (TEE);  Surgeon: Sanda Klein, MD;  Location: [place];  Service: Cardiovascular;  Laterality: N/A;  . torn biceps tendon     surgical repair  . uvulectomy & tonsillectomy  2008   Dr Wilburn Cornelia     Current Meds  Medication Sig  . ALPRAZolam (XANAX) 1 MG tablet Take 1 tablet (1 mg total) by mouth at bedtime as needed for anxiety.  Marland Kitchen aspirin 81 MG tablet Take 81 mg by mouth daily.    . cholecalciferol (VITAMIN D3) 25 MCG (1000 UT) tablet Take 1,000 Units by mouth daily.  . fluticasone (FLONASE) 50 MCG/ACT nasal spray ONE SPRAY IN EACH NOSTRIL TWICE DAILY ASNEEDED -USE CROSSOVER TECHNIQUE DISCUSSED (Patient taking differently: Place 1 spray into both nostrils daily as needed for allergies. )  . ibuprofen (ADVIL) 200 MG tablet Take 400-600 mg by mouth every 6 (six) hours as needed.  . Multiple Vitamins-Minerals (PRESERVISION AREDS 2) CAPS Take 1 capsule by mouth 2 (two) times a day.  . pantoprazole (PROTONIX) 40 MG tablet Take 1 tablet (40 mg total) by mouth daily.  Vladimir Faster Glycol-Propyl Glycol (SYSTANE OP) Place 2 drops into both eyes at bedtime.  . simvastatin (ZOCOR) 40 MG tablet Take 1 tablet (40 mg total) by mouth at bedtime.  . vardenafil (LEVITRA) 20 MG tablet Take 20 mg by mouth daily as needed for erectile dysfunction.   . vitamin B-12 (CYANOCOBALAMIN) 1000 MCG tablet Take 1,000 mcg by mouth daily.  . vitamin C (ASCORBIC ACID) 500 MG tablet Take 500 mg by mouth daily.     Allergies:   Pollen extract   Social History   Tobacco Use  . Smoking status: Former Smoker     Packs/day: 0.50    Years: 10.00    Pack years: 5.00    Types: Cigarettes    Quit date: 1983    Years since quitting: 37.6  . Smokeless tobacco: Never Used  . Tobacco comment: smoked 1972-1982, up to 1/2 ppd  Substance Use Topics  . Alcohol use: Yes    Alcohol/week: 5.0 - 6.0 standard drinks    Types: 5 - 6 Standard drinks or equivalent per week    Comment: 3-4 drinks per week  . Drug use: No     Family Hx: The patient's family history includes Breast cancer in his mother; Diabetes in his daughter; Healthy in his brother; Heart attack in his maternal grandfather; Hyperlipidemia in his son; Prostate cancer in his father; Stroke in his father and paternal uncle; Uterine cancer in  his mother. There is no history of Colon cancer, Rectal cancer, Stomach cancer, or Esophageal cancer.  ROS:   Please see the history of present illness.    All other systems reviewed and are negative.   Prior CV studies:   The following studies were reviewed today:              03/27/18 PATENT FORAMEN OVALE (PFO) CLOSURE  Conclusion  Successful Transcatheter PFO Closure using a 25 mm Amplatzer PFO occluder device Recommend: ASA/Plavix x 3 months, limited echo with bubble 6 months   _____________   Limited echo: 03/27/18 Study Conclusions - Left ventricle: The cavity size was normal. Wall thickness was normal. Systolic function was normal. The estimated ejection fraction was in the range of 60% to 65%. Wall motion was normal; there were no regional wall motion abnormalities. - Right ventricle: The cavity size was normal. Systolic function was normal. - Atrial septum: The patient is s/p PFO closure. The device appears well-seated, no evidence for flow across the device. Impressions: - Limited echo.  _____________  Echo 05/12/19 IMPRESSIONS  1. The left ventricle has normal systolic function, with an ejection fraction of 55-60%. The cavity size was normal. There is mildly increased left  ventricular wall thickness. Left ventricular diastolic Doppler parameters are consistent with impaired  relaxation.  2. The right ventricle has normal systolic function. The cavity was normal. There is no increase in right ventricular wall thickness.  3. Trivial pericardial effusion is present.  4. No stenosis of the aortic valve.  5. The aorta is normal in size and structure.  6. PFO closure device inplace. No obvious shunting by color doppler.  Labs/Other Tests and Data Reviewed:    EKG:  No ECG reviewed.  Recent Labs: 08/07/2018: ALT 19; TSH 2.53 03/11/2019: BUN 16; Creatinine, Ser 0.76; Hemoglobin 13.1; Platelets 160; Potassium 5.1; Sodium 134   Recent Lipid Panel Lab Results  Component Value Date/Time   CHOL 163 08/07/2018 09:03 AM   CHOL 183 02/07/2015 03:35 PM   TRIG 97.0 08/07/2018 09:03 AM   TRIG 81 02/07/2015 03:35 PM   HDL 59.10 08/07/2018 09:03 AM   HDL 63 02/07/2015 03:35 PM   CHOLHDL 3 08/07/2018 09:03 AM   LDLCALC 85 08/07/2018 09:03 AM   LDLCALC 104 (H) 02/07/2015 03:35 PM   LDLDIRECT 172.0 07/24/2007 12:00 AM    Wt Readings from Last 3 Encounters:  05/14/19 195 lb (88.5 kg)  03/10/19 197 lb (89.4 kg)  03/04/19 197 lb (89.4 kg)     Objective:    Vital Signs:  Ht 6\' 2"  (1.88 m)   Wt 195 lb (88.5 kg)   BMI 25.04 kg/m    Well nourished, well developed male in no acute distress. Appears younger than his stated age   ASSESSMENT & PLAN:    PFO s/p PFO closure: he is doing quite well with no issues. Echo done 05/12/19 did not have a bubble study but showed EF 55% with PFO closure device in place without obvious shunting. The patient is doing quite well clinically. No new neuro symptoms and stays very active, although recovering from shoulder surgery. Loop recorder still in place but disabled. Atrial fibrillation was never detected. He will continue on aspirin indefinitely.   Cryptogenic CVA: s/p PFO closure as above. Continue statin and aspirin  GERD:  continue PPI  HLD: continue statin   COVID-19 Education: The signs and symptoms of COVID-19 were discussed with the patient and how to seek care for testing (  follow up with PCP or arrange E-visit).  The importance of social distancing was discussed today.  Time:   Today, I have spent 15 minutes with the patient with telehealth technology discussing the above problems.     Medication Adjustments/Labs and Tests Ordered: Current medicines are reviewed at length with the patient today.  Concerns regarding medicines are outlined above.   Tests Ordered: No orders of the defined types were placed in this encounter.   Medication Changes: No orders of the defined types were placed in this encounter.   Disposition:  Follow up prn  Signed, Angelena Form, PA-C  05/14/2019 3:26 PM    [place]

## 2019-05-28 ENCOUNTER — Other Ambulatory Visit: Payer: Self-pay | Admitting: Internal Medicine

## 2019-05-28 MED ORDER — SIMVASTATIN 40 MG PO TABS: 40.0000 mg | ORAL_TABLET | Freq: Every day | ORAL | 0 refills | Status: DC

## 2019-05-28 MED ORDER — PANTOPRAZOLE SODIUM 40 MG PO TBEC: 40.0000 mg | DELAYED_RELEASE_TABLET | Freq: Every day | ORAL | 0 refills | Status: DC

## 2019-05-28 NOTE — Telephone Encounter (Signed)
Copied from [place] [id number]. Topic: Quick Communication - Rx Refill/Question >> May 28, 2019  1:43 PM Leward Quan A wrote: Medication: simvastatin (ZOCOR) 40 MG tablet, pantoprazole (PROTONIX) 40 MG tablet  Patient asked to be notified via My Chart when Rx is sent  Has the patient contacted their pharmacy?  no  (Agent: If no, request that the patient contact the pharmacy for the refill.) (Agent: If yes, when and what did the pharmacy advise?)  Preferred Pharmacy (with phone number or street name): [facility], [place] [phone] (Phone) [phone] (Fax)    Agent: Please be advised that RX refills may take up to 3 business days. We ask that you follow-up with your pharmacy.

## 2019-06-10 ENCOUNTER — Telehealth: Payer: Self-pay | Admitting: Internal Medicine

## 2019-06-10 NOTE — Telephone Encounter (Signed)
Recv'd records from [facility]  forwarded 2 pages to Dr. Billey Gosling 9/2/20fbg

## 2019-07-13 ENCOUNTER — Telehealth: Payer: Self-pay

## 2019-07-13 ENCOUNTER — Other Ambulatory Visit: Payer: Self-pay

## 2019-07-13 MED ORDER — METRONIDAZOLE 250 MG PO TABS: 250.0000 mg | ORAL_TABLET | Freq: Three times a day (TID) | ORAL | 0 refills | Status: DC

## 2019-07-13 NOTE — Telephone Encounter (Signed)
Script sent to pharmacy and pt aware. 

## 2019-07-13 NOTE — Telephone Encounter (Signed)
-----   Message from Irene Shipper, MD sent at 07/13/2019  8:15 AM EDT ----- Regarding: meds Patient called with 12 days of diarrhea. No fever, pain, or bleeding. Going away today and inquired about therapy. He is using imodium currently. Please prescribe metronidazole 250 mg tid x 7 days. [facility] this morning. Let patient know when called in. Thanks

## 2019-08-09 NOTE — Progress Notes (Signed)
Subjective:    Patient ID: Steve [name], male    DOB: 1951/04/25, 68 y.o.   MRN: [id number]  HPI He is here for a physical exam.   He is still recovering from his reverse right shoulder replacement that he had in June.  He has made good progress with physical therapy, but is still not as far as he would like to be.  He is still doing physical therapy.  Medications and allergies reviewed with patient and updated if appropriate.  Patient Active Problem List   Diagnosis Date Noted  . 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  . 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 Difficulty sleeping 02/06/2019  . Preoperative clearance 01/29/2019  . Hair thinning 08/06/2017  . B12 deficiency 08/06/2017  . Status post placement of implantable loop recorder 04/24/2016  . Squamous cell carcinoma 03/09/2016  . Patent foramen ovale with atrial septal aneurysm, s/p closure 6/19 12/08/2015  . GERD (gastroesophageal reflux disease) 11/09/2015  . History of TIA (transient ischemic attack) 09/30/2015  . Abnormal echocardiogram 09/30/2015  . History of esophageal stricture 04/20/2013  . VITAMIN D DEFICIENCY 06/07/2010  . BENIGN PROSTATIC HYPERTROPHY 06/07/2010  . LIBIDO, DECREASED 06/07/2010  . FIRST DEGREE ATRIOVENTRICULAR BLOCK 02/28/2009  . History of colonic polyps 06/18/2008  . HYPERLIPIDEMIA 08/26/2007  . 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 SKIN CANCER, HX OF 07/24/2007  . Sleep apnea 07/15/2007    Current Outpatient Medications on File Prior to Visit  Medication Sig Dispense Refill  . ALPRAZolam (XANAX) 1 MG tablet Take 1 tablet (1 mg total) by mouth at bedtime as needed for anxiety. 30 tablet 1  . aspirin 81 MG tablet Take 81 mg by mouth daily.      . cholecalciferol (VITAMIN D3) 25 MCG (1000 UT) tablet Take 1,000 Units by mouth daily.    . fluticasone (FLONASE) 50 MCG/ACT nasal spray ONE SPRAY IN EACH NOSTRIL TWICE DAILY  ASNEEDED -USE CROSSOVER TECHNIQUE DISCUSSED (Patient taking differently: Place 1 spray into both nostrils daily as needed for allergies. ) 16 g 1  . ibuprofen (ADVIL) 200 MG tablet Take 400-600 mg by mouth every 6 (six) hours as needed.    . Multiple Vitamins-Minerals (PRESERVISION AREDS 2) CAPS Take 1 capsule by mouth 2 (two) times a day.    . pantoprazole (PROTONIX) 40 MG tablet Take 1 tablet (40 mg total) by mouth daily. 30 tablet 0  . Polyethyl Glycol-Propyl Glycol (SYSTANE OP) Place 2 drops into both eyes at bedtime.    . simvastatin (ZOCOR) 40 MG tablet Take 1 tablet (40 mg total) by mouth at bedtime. 90 tablet 0  . vardenafil (LEVITRA) 20 MG tablet Take 20 mg by mouth daily as needed for erectile dysfunction.     . vitamin B-12 (CYANOCOBALAMIN) 1000 MCG tablet Take 1,000 mcg by mouth daily.    . vitamin C (ASCORBIC ACID) 500 MG tablet Take 500 mg by mouth daily.     No current facility-administered medications on file prior to visit.     Past Medical History:  Diagnosis Date  . Arthritis    Right shoulder  . Benign hypertrophy of prostate    Dr.Dahlstedt  . Diverticulosis 2013   Moderate   . First degree AV block 12/09/2018   noted on EKG  . GERD (gastroesophageal reflux disease)   . Grade I diastolic dysfunction 99991111   Noted on ECHO   . Hemifacial spasm   .  History of loop recorder   . Hx of colonic polyp 2008 & 2013   X2  . Hyperlipidemia   . Internal hemorrhoids 2013  . PFO (patent foramen ovale)    History of   . 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  . Sleep apnea    Resolved  . Squamous cell skin cancer    Dr.King [place]  . Stricture esophagus 2013    dilation X 1  . Stroke [facility]) 2016   left semiovale stroke   . TIA (transient ischemic attack) 01/2015  . Ulcerative esophagitis 2013    Past Surgical History:  Procedure Laterality Date  . COLONOSCOPY W/ POLYPECTOMY  2013   tubular adenoma  . COLONOSCOPY W/ POLYPECTOMY  2008   hyperplastic   . EP IMPLANTABLE DEVICE N/A 01/03/2016   Procedure: Loop Recorder Insertion;  Surgeon: Sanda Klein, MD;  Location: [facility];  Service: Cardiovascular;  Laterality: N/A;  . MOHS SURGERY     x 3  . NASAL SINUS SURGERY    . PATENT FORAMEN OVALE(PFO) CLOSURE N/A 03/27/2018   Procedure: PATENT FORAMEN OVALE (PFO) CLOSURE;  Surgeon: Sherren Mocha, MD;  Location: [facility];  Service: Cardiovascular;  Laterality: N/A;  . REVERSE SHOULDER ARTHROPLASTY Right 03/10/2019   Procedure: REVERSE SHOULDER ARTHROPLASTY;  Surgeon: Marchia Bond, MD;  Location: [facility];  Service: Orthopedics;  Laterality: Right;  . ROTATOR CUFF REPAIR Left   . TEE WITHOUT CARDIOVERSION N/A 01/06/2018   Procedure: TRANSESOPHAGEAL ECHOCARDIOGRAM (TEE);  Surgeon: Sanda Klein, MD;  Location: [facility];  Service: Cardiovascular;  Laterality: N/A;  . torn biceps tendon     surgical repair  . uvulectomy & tonsillectomy  2008   Dr Wilburn Cornelia    Social History   Socioeconomic History  . Marital status: Married    Spouse name: Not on file  . Number of children: Not on file  . Years of education: Not on file  . Highest education level: Not on file  Occupational History  . 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 Financial resource strain: Not on file  . Food insecurity    Worry: Not on file    Inability: Not on file  . Transportation needs    Medical: Not on file    Non-medical: Not on file  Tobacco Use  . Smoking status: Former Smoker    Packs/day: 0.50    Years: 10.00    Pack years: 5.00    Types: Cigarettes    Quit date: 1983    Years since quitting: 37.8  . Smokeless tobacco: Never Used  . Tobacco comment: smoked 1972-1982, up to 1/2 ppd  Substance and Sexual Activity  . Alcohol use: Yes    Alcohol/week: 5.0 - 6.0 standard drinks    Types: 5 - 6 Standard drinks or equivalent per week    Comment: 3-4 drinks per week  . Drug use: No  . Sexual activity: Not on file   Lifestyle  . Physical activity    Days per week: Not on file    Minutes per session: Not on file  . Stress: Not on file  Relationships  . Social Herbalist on phone: Not on file    Gets together: Not on file    Attends religious service: Not on file    Active member of club or organization: Not on file    Attends meetings of clubs or organizations: Not on file  Relationship status: Not on file  Other Topics Concern  . Not on file  Social History Narrative   GETS REG EXERCISE 4x weekly   NO DIET   Lives with wife in a 2 story home.  Has 2 children.     Works as a Insurance underwriter.     Education: college       Family History  Problem Relation Age of Onset  . Uterine cancer Mother   . Breast cancer Mother        Deceased, 22  . Stroke Father        > 48  . Prostate cancer Father        Deceased, 70  . Diabetes Daughter        type 1  . Stroke Paternal Uncle        > 55  . Heart attack Maternal Grandfather         >55/mid-60s  . Hyperlipidemia Son   . Healthy Brother   . Colon cancer Neg Hx   . Rectal cancer Neg Hx   . Stomach cancer Neg Hx   . Esophageal cancer Neg Hx     Review of Systems  Constitutional: Negative for chills and fever.  Eyes: Negative for visual disturbance.  Respiratory: Negative for cough, shortness of breath and wheezing.   Cardiovascular: Negative for chest pain, palpitations and leg swelling.  Gastrointestinal: Negative for abdominal pain, blood in stool, constipation, diarrhea and nausea.       GERD controlled  Genitourinary: Negative for dysuria and hematuria.  Musculoskeletal: Negative for arthralgias and back pain.  Skin: Positive for color change (on legs). Negative for rash.  Neurological: Negative for dizziness, light-headedness and headaches.  Psychiatric/Behavioral: Negative for dysphoric mood. The patient is not nervous/anxious.        Objective:   Vitals:   08/10/19 1445  BP: (!) 148/80  Pulse: 72  Resp: 16   Temp: 98.1 F (36.7 C)  SpO2: 99%   Filed Weights   08/10/19 1445  Weight: 200 lb 12.8 oz (91.1 kg)   Body mass index is 25.78 kg/m.  BP Readings from Last 3 Encounters:  08/10/19 (!) 148/80  03/11/19 (!) 142/70  03/04/19 (!) 143/74    Wt Readings from Last 3 Encounters:  08/10/19 200 lb 12.8 oz (91.1 kg)  05/14/19 195 lb (88.5 kg)  03/10/19 197 lb (89.4 kg)     Physical Exam Constitutional: He appears well-developed and well-nourished. No distress.  HENT:  Head: Normocephalic and atraumatic.  Right Ear: External ear normal.  Left Ear: External ear normal.  Mouth/Throat: Oropharynx is clear and moist.  Normal ear canals and TM b/l  Eyes: Intermittent ptosis right upper eyelid, conjunctivae and EOM are normal.  Neck: Neck supple. No tracheal deviation present. No thyromegaly present. No carotid bruit  Cardiovascular: Normal rate, regular rhythm, normal heart sounds and intact distal pulses.   No murmur heard. Pulmonary/Chest: Effort normal and breath sounds normal. No respiratory distress. He has no wheezes. He has no rales.  Abdominal: Soft. He exhibits no distension. There is no tenderness.  Genitourinary: deferred to urology Musculoskeletal: He exhibits no edema.  Lymphadenopathy:   He has no cervical adenopathy.  Skin: Skin is warm and dry. He is not diaphoretic.  Psychiatric: He has a normal mood and affect. His behavior is normal.         Assessment & Plan:   Physical exam: Screening blood work  ordered Immunizations  All up to  date Colonoscopy    Up to date  Eye exams   Up to date  Exercise    Yoga, PT, walking Weight  Normal BMI Sees derm 2/year Substance abuse   none  See Problem List for Assessment and Plan of chronic medical problems.   FU in one year

## 2019-08-09 NOTE — Patient Instructions (Addendum)
Tests ordered today. Your results will be released to [facility] (or called to you) after review.  If any changes need to be made, you will be notified at that same time.  All other Health Maintenance issues reviewed.   All recommended immunizations and age-appropriate screenings are up-to-date or discussed.   Medications reviewed and updated.  Changes include :   none    Please followup in 1 year    Health Maintenance, Male Adopting a healthy lifestyle and getting preventive care are important in promoting health and wellness. Ask your health care provider about:  The right schedule for you to have regular tests and exams.  Things you can do on your own to prevent diseases and keep yourself healthy. What should I know about diet, weight, and exercise? Eat a healthy diet   Eat a diet that includes plenty of vegetables, fruits, low-fat dairy products, and lean protein.  Do not eat a lot of foods that are high in solid fats, added sugars, or sodium. Maintain a healthy weight Body mass index (BMI) is a measurement that can be used to identify possible weight problems. It estimates body fat based on height and weight. Your health care provider can help determine your BMI and help you achieve or maintain a healthy weight. Get regular exercise Get regular exercise. This is one of the most important things you can do for your health. Most adults should:  Exercise for at least 150 minutes each week. The exercise should increase your heart rate and make you sweat (moderate-intensity exercise).  Do strengthening exercises at least twice a week. This is in addition to the moderate-intensity exercise.  Spend less time sitting. Even light physical activity can be beneficial. Watch cholesterol and blood lipids Have your blood tested for lipids and cholesterol at 69 years of age, then have this test every 5 years. You may need to have your cholesterol levels checked more often if:  Your lipid  or cholesterol levels are high.  You are older than 69 years of age.  You are at high risk for heart disease. What should I know about cancer screening? Many types of cancers can be detected early and may often be prevented. Depending on your health history and family history, you may need to have cancer screening at various ages. This may include screening for:  Colorectal cancer.  Prostate cancer.  Skin cancer.  Lung cancer. What should I know about heart disease, diabetes, and high blood pressure? Blood pressure and heart disease  High blood pressure causes heart disease and increases the risk of stroke. This is more likely to develop in people who have high blood pressure readings, are of African descent, or are overweight.  Talk with your health care provider about your target blood pressure readings.  Have your blood pressure checked: ? Every 3-5 years if you are 72-30 years of age. ? Every year if you are 40 years old or older.  If you are between the ages of 52 and 19 and are a current or former smoker, ask your health care provider if you should have a one-time screening for abdominal aortic aneurysm (AAA). Diabetes Have regular diabetes screenings. This checks your fasting blood sugar level. Have the screening done:  Once every three years after age 81 if you are at a normal weight and have a low risk for diabetes.  More often and at a younger age if you are overweight or have a high risk for diabetes. What should  I know about preventing infection? Hepatitis B If you have a higher risk for hepatitis B, you should be screened for this virus. Talk with your health care provider to find out if you are at risk for hepatitis B infection. Hepatitis C Blood testing is recommended for:  Everyone born from [birth year] through 1966.  Anyone with known risk factors for hepatitis C. Sexually transmitted infections (STIs)  You should be screened each year for STIs, including  gonorrhea and chlamydia, if: ? You are sexually active and are younger than 69 years of age. ? You are older than 69 years of age and your health care provider tells you that you are at risk for this type of infection. ? Your sexual activity has changed since you were last screened, and you are at increased risk for chlamydia or gonorrhea. Ask your health care provider if you are at risk.  Ask your health care provider about whether you are at high risk for HIV. Your health care provider may recommend a prescription medicine to help prevent HIV infection. If you choose to take medicine to prevent HIV, you should first get tested for HIV. You should then be tested every 3 months for as long as you are taking the medicine. Follow these instructions at home: Lifestyle  Do not use any products that contain nicotine or tobacco, such as cigarettes, e-cigarettes, and chewing tobacco. If you need help quitting, ask your health care provider.  Do not use street drugs.  Do not share needles.  Ask your health care provider for help if you need support or information about quitting drugs. Alcohol use  Do not drink alcohol if your health care provider tells you not to drink.  If you drink alcohol: ? Limit how much you have to 0-2 drinks a day. ? Be aware of how much alcohol is in your drink. In the [place] one drink equals one 12 oz bottle of beer (355 mL), one 5 oz glass of wine (148 mL), or one 1 oz glass of hard liquor (44 mL). General instructions  Schedule regular health, dental, and eye exams.  Stay current with your vaccines.  Tell your health care provider if: ? You often feel depressed. ? You have ever been abused or do not feel safe at home. Summary  Adopting a healthy lifestyle and getting preventive care are important in promoting health and wellness.  Follow your health care provider's instructions about healthy diet, exercising, and getting tested or screened for diseases.   Follow your health care provider's instructions on monitoring your cholesterol and blood pressure. This information is not intended to replace advice given to you by your health care provider. Make sure you discuss any questions you have with your health care provider. Document Released: 03/22/2008 Document Revised: 09/17/2018 Document Reviewed: 09/17/2018 Elsevier Patient Education  2020 [employer].

## 2019-08-10 ENCOUNTER — Encounter: Payer: Self-pay | Admitting: Internal Medicine

## 2019-08-10 ENCOUNTER — Ambulatory Visit (INDEPENDENT_AMBULATORY_CARE_PROVIDER_SITE_OTHER): Payer: 59 | Admitting: Internal Medicine

## 2019-08-10 ENCOUNTER — Other Ambulatory Visit: Payer: Self-pay

## 2019-08-10 ENCOUNTER — Encounter: Payer: 59 | Admitting: Internal Medicine

## 2019-08-10 VITALS — BP 148/80 | HR 72 | Temp 98.1°F | Resp 16 | Ht 74.0 in | Wt 200.8 lb

## 2019-08-10 DIAGNOSIS — K219 Gastro-esophageal reflux disease without esophagitis: Secondary | ICD-10-CM | POA: Diagnosis not present

## 2019-08-10 DIAGNOSIS — R739 Hyperglycemia, unspecified: Secondary | ICD-10-CM

## 2019-08-10 DIAGNOSIS — Z85828 Personal history of other malignant neoplasm of skin: Secondary | ICD-10-CM

## 2019-08-10 DIAGNOSIS — E538 Deficiency of other specified B group vitamins: Secondary | ICD-10-CM

## 2019-08-10 DIAGNOSIS — E782 Mixed hyperlipidemia: Secondary | ICD-10-CM | POA: Diagnosis not present

## 2019-08-10 DIAGNOSIS — Z0000 Encounter for general adult medical examination without abnormal findings: Secondary | ICD-10-CM

## 2019-08-10 DIAGNOSIS — N400 Enlarged prostate without lower urinary tract symptoms: Secondary | ICD-10-CM

## 2019-08-10 DIAGNOSIS — Z125 Encounter for screening for malignant neoplasm of prostate: Secondary | ICD-10-CM

## 2019-08-10 DIAGNOSIS — G479 Sleep disorder, unspecified: Secondary | ICD-10-CM

## 2019-08-10 DIAGNOSIS — Z8673 Personal history of transient ischemic attack (TIA), and cerebral infarction without residual deficits: Secondary | ICD-10-CM

## 2019-08-10 NOTE — Assessment & Plan Note (Signed)
PFO closure last year Taking aspirin 81 mg daily, simvastatin daily Lipid panel, CBC, CMP

## 2019-08-10 NOTE — Assessment & Plan Note (Signed)
Check lipid panel, CMP, TSH Continue daily statin Regular exercise and healthy diet encouraged  

## 2019-08-10 NOTE — Assessment & Plan Note (Signed)
Was taking Xanax prior to having shoulder surgery and just after because of difficulty sleeping because of the shoulder pain Now taking melatonin only

## 2019-08-10 NOTE — Assessment & Plan Note (Signed)
Taking B12 daily We will check level

## 2019-08-10 NOTE — Assessment & Plan Note (Signed)
Follows with urology

## 2019-08-10 NOTE — Assessment & Plan Note (Signed)
Follows with dermatology twice a year. ?

## 2019-08-10 NOTE — Assessment & Plan Note (Signed)
GERD controlled Continue daily medication  

## 2019-08-18 ENCOUNTER — Other Ambulatory Visit: Payer: Self-pay

## 2019-08-18 DIAGNOSIS — Z20822 Contact with and (suspected) exposure to covid-19: Secondary | ICD-10-CM

## 2019-08-20 LAB — NOVEL CORONAVIRUS, NAA: SARS-CoV-2, NAA: NOT DETECTED

## 2019-08-21 ENCOUNTER — Other Ambulatory Visit (INDEPENDENT_AMBULATORY_CARE_PROVIDER_SITE_OTHER): Payer: 59

## 2019-08-21 DIAGNOSIS — E782 Mixed hyperlipidemia: Secondary | ICD-10-CM | POA: Diagnosis not present

## 2019-08-21 DIAGNOSIS — E538 Deficiency of other specified B group vitamins: Secondary | ICD-10-CM

## 2019-08-21 DIAGNOSIS — Z0000 Encounter for general adult medical examination without abnormal findings: Secondary | ICD-10-CM

## 2019-08-21 DIAGNOSIS — R739 Hyperglycemia, unspecified: Secondary | ICD-10-CM | POA: Diagnosis not present

## 2019-08-21 LAB — CBC WITH DIFFERENTIAL/PLATELET
Basophils Absolute: 0 10*3/uL (ref 0.0–0.1)
Basophils Relative: 0.9 % (ref 0.0–3.0)
Eosinophils Absolute: 0.1 10*3/uL (ref 0.0–0.7)
Eosinophils Relative: 2.3 % (ref 0.0–5.0)
HCT: 46 % (ref 39.0–52.0)
Hemoglobin: 15.6 g/dL (ref 13.0–17.0)
Lymphocytes Relative: 20.8 % (ref 12.0–46.0)
Lymphs Abs: 1 10*3/uL (ref 0.7–4.0)
MCHC: 33.9 g/dL (ref 30.0–36.0)
MCV: 92.4 fl (ref 78.0–100.0)
Monocytes Absolute: 0.6 10*3/uL (ref 0.1–1.0)
Monocytes Relative: 13.4 % — ABNORMAL HIGH (ref 3.0–12.0)
Neutro Abs: 3 10*3/uL (ref 1.4–7.7)
Neutrophils Relative %: 62.6 % (ref 43.0–77.0)
Platelets: 196 10*3/uL (ref 150.0–400.0)
RBC: 4.98 Mil/uL (ref 4.22–5.81)
RDW: 13.6 % (ref 11.5–15.5)
WBC: 4.8 10*3/uL (ref 4.0–10.5)

## 2019-08-21 LAB — LIPID PANEL
Cholesterol: 167 mg/dL (ref 0–200)
HDL: 48.8 mg/dL (ref >39.00)
LDL Cholesterol: 94 mg/dL (ref 0–99)
NonHDL: 118.52
Total CHOL/HDL Ratio: 3
Triglycerides: 123 mg/dL (ref 0.0–149.0)
VLDL: 24.6 mg/dL (ref 0.0–40.0)

## 2019-08-21 LAB — TSH: TSH: 3.59 u[IU]/mL (ref 0.35–4.50)

## 2019-08-21 LAB — COMPREHENSIVE METABOLIC PANEL
ALT: 54 U/L — ABNORMAL HIGH (ref 0–53)
AST: 33 U/L (ref 0–37)
Albumin: 4.5 g/dL (ref 3.5–5.2)
Alkaline Phosphatase: 69 U/L (ref 39–117)
BUN: 16 mg/dL (ref 6–23)
CO2: 27 mEq/L (ref 19–32)
Calcium: 9 mg/dL (ref 8.4–10.5)
Chloride: 102 mEq/L (ref 96–112)
Creatinine, Ser: 1.05 mg/dL (ref 0.40–1.50)
GFR: 69.91 mL/min (ref >60.00)
Glucose, Bld: 118 mg/dL — ABNORMAL HIGH (ref 70–99)
Potassium: 4 mEq/L (ref 3.5–5.1)
Sodium: 138 mEq/L (ref 135–145)
Total Bilirubin: 0.6 mg/dL (ref 0.2–1.2)
Total Protein: 7.3 g/dL (ref 6.0–8.3)

## 2019-08-21 LAB — HEMOGLOBIN A1C: Hgb A1c MFr Bld: 5.9 % (ref 4.6–6.5)

## 2019-08-21 LAB — VITAMIN B12: Vitamin B-12: 1143 pg/mL — ABNORMAL HIGH (ref 211–911)

## 2019-08-22 ENCOUNTER — Encounter: Payer: Self-pay | Admitting: Internal Medicine

## 2019-08-22 DIAGNOSIS — R7303 Prediabetes: Secondary | ICD-10-CM | POA: Insufficient documentation

## 2019-09-10 ENCOUNTER — Encounter: Payer: Self-pay | Admitting: Internal Medicine

## 2019-09-18 ENCOUNTER — Ambulatory Visit: Payer: 59 | Admitting: Neurology

## 2019-10-08 ENCOUNTER — Ambulatory Visit (INDEPENDENT_AMBULATORY_CARE_PROVIDER_SITE_OTHER): Payer: 59 | Admitting: Internal Medicine

## 2019-10-08 ENCOUNTER — Encounter: Payer: Self-pay | Admitting: Internal Medicine

## 2019-10-08 ENCOUNTER — Other Ambulatory Visit: Payer: Self-pay

## 2019-10-08 DIAGNOSIS — Z20828 Contact with and (suspected) exposure to other viral communicable diseases: Secondary | ICD-10-CM

## 2019-10-08 DIAGNOSIS — Z20822 Contact with and (suspected) exposure to covid-19: Secondary | ICD-10-CM | POA: Insufficient documentation

## 2019-10-08 NOTE — Assessment & Plan Note (Signed)
Getting tested for covid-19 Monday, we talked about need to quarantine for 14 days. Encouraged strongly to wear masks even when outdoors when meeting with anyone outside their household. Answered questions about vaccines.

## 2019-10-08 NOTE — Progress Notes (Signed)
Virtual Visit via Video Note  I connected with Steve [name] on 10/08/19 at 10:40 AM EST by a video enabled telemedicine application and verified that I am speaking with the correct person using two identifiers.  The patient and the provider were at separate locations throughout the entire encounter.   I discussed the limitations of evaluation and management by telemedicine and the availability of in person appointments. The patient expressed understanding and agreed to proceed. The patient and the provider were the only parties present for the visit unless noted in HPI below.  History of Present Illness: The patient is a 68 y.o. man with visit for covid-19 exposure. A couple that the patient and his wife met with have recently notified him that they tested positive for covid-19. They were outside for several hours together and feel that they had physical distancing but no one was wearing masks. They were drinking liquids. Denies that the couple had any sign of illness or cough during the gathering. Last around the contact 10/05/19. Has no symptoms of covid-19.   Observations/Objective: Appearance: normal, breathing appears normal, casual grooming, abdomen does not appear distended, throat normal, memory normal, mental status is A and O times 3  Assessment and Plan: See problem oriented charting  Follow Up Instructions: quarantine 14 days, testing Monday for covid-19, again for new symptoms.   Visit time 25 minutes: greater than 50% of that time was spent in face to face counseling and coordination of care with the patient: counseled about covid-19 vaccinations, plan from state, where to find information about vaccine, exposure to covid-19 and standards for protecting themselves from exposure.   I discussed the assessment and treatment plan with the patient. The patient was provided an opportunity to ask questions and all were answered. The patient agreed with the plan and demonstrated an  understanding of the instructions.   The patient was advised to call back or seek an in-person evaluation if the symptoms worsen or if the condition fails to improve as anticipated.  Hoyt Koch, MD

## 2019-10-09 HISTORY — PX: PROSTATE BIOPSY: SHX241

## 2019-10-12 ENCOUNTER — Ambulatory Visit: Payer: 59 | Attending: Internal Medicine

## 2019-10-12 DIAGNOSIS — Z20822 Contact with and (suspected) exposure to covid-19: Secondary | ICD-10-CM

## 2019-10-14 LAB — NOVEL CORONAVIRUS, NAA: SARS-CoV-2, NAA: NOT DETECTED

## 2019-10-23 ENCOUNTER — Other Ambulatory Visit: Payer: Self-pay | Admitting: Internal Medicine

## 2019-10-29 ENCOUNTER — Ambulatory Visit: Payer: 59 | Attending: Internal Medicine

## 2019-10-29 DIAGNOSIS — Z23 Encounter for immunization: Secondary | ICD-10-CM

## 2019-10-29 NOTE — Progress Notes (Signed)
   Covid-19 Vaccination Clinic  Name:  Steve [name]    MRN: [id number] DOB: [birth year]  10/29/2019  [name] was observed post Covid-19 immunization for 15 minutes without incidence. He was provided with Vaccine Information Sheet and instruction to access the V-Safe system.   [name] was instructed to call 911 with any severe reactions post vaccine: Marland Kitchen Difficulty breathing  . Swelling of your face and throat  . A fast heartbeat  . A bad rash all over your body  . Dizziness and weakness    Immunizations Administered    Name Date Dose VIS Date Route   Pfizer COVID-19 Vaccine 10/29/2019  3:02 PM 0.3 mL 09/18/2019 Intramuscular   Manufacturer: [place]   Lot: [id number]   [place]: [id number]

## 2019-11-12 ENCOUNTER — Ambulatory Visit: Payer: 59

## 2019-11-16 ENCOUNTER — Ambulatory Visit: Payer: 59 | Attending: Internal Medicine

## 2019-11-16 DIAGNOSIS — Z23 Encounter for immunization: Secondary | ICD-10-CM | POA: Insufficient documentation

## 2019-11-16 NOTE — Progress Notes (Signed)
   Covid-19 Vaccination Clinic  Name:  Steve [name]    MRN: [id number] DOB: May 14, 1951  11/16/2019  [name] was observed post Covid-19 immunization for 15 minutes without incidence. He was provided with Vaccine Information Sheet and instruction to access the V-Safe system.   [name] was instructed to call 911 with any severe reactions post vaccine: Marland Kitchen Difficulty breathing  . Swelling of your face and throat  . A fast heartbeat  . A bad rash all over your body  . Dizziness and weakness    Immunizations Administered    Name Date Dose VIS Date Route   Pfizer COVID-19 Vaccine 11/16/2019  8:36 AM 0.3 mL 09/18/2019 Intramuscular   Manufacturer: [place]   Lot: [id number]   [place]: [id number]

## 2019-12-28 NOTE — Progress Notes (Signed)
Subjective:    Patient ID: Steve [name], male    DOB: 02/27/1951, 69 y.o.   MRN: [id number]  HPI The patient is here for an acute visit.   Dizziness:  Friday night, 4 nights ago, he woke up in the middle of the night to go to the bathroom and his balance was off.  He played golf the next day and would bend over to pick up the ball and when he came up he felt lightheaded.  That night he does feel like his balance was off.  He had similar symptoms the following day.  Yesterday, the symptoms were better, but still there.  He noticed that if he moved his head he would have lightheadedness/dizziness transiently.  Today he does not feel any lightheaded or dizziness.  His balance is okay and he feels back to normal.  He is able to move his head without having any lightheadedness/dizziness.  This is his first episode.  He was concerned about possibility of a stroke as he has had one in the past.  He wondered if he was possibly dehydrated so over the weekend he did increase his fluids, which did not seem to help much.  He denies cold symptoms.  Even though his symptoms have resolved he wanted to keep the appointment to see with the possible causes of his symptoms were.    Medications and allergies reviewed with patient and updated if appropriate.  Patient Active Problem List   Diagnosis Date Noted  . Exposure to COVID-19 virus 10/08/2019  . Prediabetes 08/22/2019  . 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  . 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 Difficulty sleeping 02/06/2019  . Preoperative clearance 01/29/2019  . Intermediate stage nonexudative age-related macular degeneration of both eyes 07/30/2018  . Hair thinning 08/06/2017  . B12 deficiency 08/06/2017  . Status post placement of implantable loop recorder 04/24/2016  . Squamous cell carcinoma 03/09/2016  . Patent foramen ovale with atrial septal aneurysm, s/p closure 6/19 12/08/2015  . GERD  (gastroesophageal reflux disease) 11/09/2015  . History of TIA (transient ischemic attack) 09/30/2015  . History of esophageal stricture 04/20/2013  . VITAMIN D DEFICIENCY 06/07/2010  . BPH (benign prostatic hyperplasia) 06/07/2010  . FIRST DEGREE ATRIOVENTRICULAR BLOCK 02/28/2009  . History of colonic polyps 06/18/2008  . HYPERLIPIDEMIA 08/26/2007  . 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 SKIN CANCER, HX OF 07/24/2007  . Sleep apnea 07/15/2007    Current Outpatient Medications on File Prior to Visit  Medication Sig Dispense Refill  . aspirin 81 MG tablet Take 81 mg by mouth daily.      . cholecalciferol (VITAMIN D3) 25 MCG (1000 UT) tablet Take 1,000 Units by mouth daily.    . Multiple Vitamins-Minerals (PRESERVISION AREDS 2) CAPS Take 1 capsule by mouth 2 (two) times a day.    . pantoprazole (PROTONIX) 40 MG tablet Take 1 tablet (40 mg total) by mouth daily. 30 tablet 0  . Polyethyl Glycol-Propyl Glycol (SYSTANE OP) Place 2 drops into both eyes at bedtime.    . simvastatin (ZOCOR) 40 MG tablet TAKE ONE TABLET AT BEDTIME 90 tablet 3  . vardenafil (LEVITRA) 20 MG tablet Take 20 mg by mouth daily as needed for erectile dysfunction.     . vitamin B-12 (CYANOCOBALAMIN) 1000 MCG tablet Take 1,000 mcg by mouth daily.    . vitamin C (ASCORBIC ACID) 500 MG tablet Take 500 mg by mouth daily.  No current facility-administered medications on file prior to visit.    Past Medical History:  Diagnosis Date  . Arthritis    Right shoulder  . Benign hypertrophy of prostate    Dr.Dahlstedt  . Diverticulosis 2013   Moderate   . First degree AV block 12/09/2018   noted on EKG  . GERD (gastroesophageal reflux disease)   . Grade I diastolic dysfunction 99991111   Noted on ECHO   . Hemifacial spasm   . History of loop recorder   . Hx of colonic polyp 2008 & 2013   X2  . Hyperlipidemia   . Internal hemorrhoids 2013  . PFO (patent foramen ovale)    History  of   . 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  . Sleep apnea    Resolved  . Squamous cell skin cancer    Dr.King [place]  . Stricture esophagus 2013    dilation X 1  . Stroke [facility]) 2016   left semiovale stroke   . TIA (transient ischemic attack) 01/2015  . Ulcerative esophagitis 2013    Past Surgical History:  Procedure Laterality Date  . COLONOSCOPY W/ POLYPECTOMY  2013   tubular adenoma  . COLONOSCOPY W/ POLYPECTOMY  2008   hyperplastic  . EP IMPLANTABLE DEVICE N/A 01/03/2016   Procedure: Loop Recorder Insertion;  Surgeon: Sanda Klein, MD;  Location: [place] [facility];  Service: Cardiovascular;  Laterality: N/A;  . MOHS SURGERY     x 3  . NASAL SINUS SURGERY    . PATENT FORAMEN OVALE(PFO) CLOSURE N/A 03/27/2018   Procedure: PATENT FORAMEN OVALE (PFO) CLOSURE;  Surgeon: Sherren Mocha, MD;  Location: [place] [facility];  Service: Cardiovascular;  Laterality: N/A;  . REVERSE SHOULDER ARTHROPLASTY Right 03/10/2019   Procedure: REVERSE SHOULDER ARTHROPLASTY;  Surgeon: Marchia Bond, MD;  Location: [facility];  Service: Orthopedics;  Laterality: Right;  . ROTATOR CUFF REPAIR Left   . TEE WITHOUT CARDIOVERSION N/A 01/06/2018   Procedure: TRANSESOPHAGEAL ECHOCARDIOGRAM (TEE);  Surgeon: Sanda Klein, MD;  Location: [place];  Service: Cardiovascular;  Laterality: N/A;  . torn biceps tendon     surgical repair  . uvulectomy & tonsillectomy  2008   Dr Wilburn Cornelia    Social History   Socioeconomic History  . Marital status: Married    Spouse name: Not on file  . Number of children: Not on file  . Years of education: Not on file  . Highest education level: Not on file  Occupational History  . Occupation: [employer]IDENT [employer]  Tobacco Use  . Smoking status: Former Smoker    Packs/day: 0.50    Years: 10.00    Pack years: 5.00    Types: Cigarettes    Quit date: 1983    Years since quitting: 38.2  . Smokeless tobacco: Never Used  . Tobacco comment: smoked  1972-1982, up to 1/2 ppd  Substance and Sexual Activity  . Alcohol use: Yes    Alcohol/week: 5.0 - 6.0 standard drinks    Types: 5 - 6 Standard drinks or equivalent per week    Comment: 3-4 drinks per week  . Drug use: No  . Sexual activity: Not on file  Other Topics Concern  . Not on file  Social History Narrative   GETS REG EXERCISE 4x weekly   NO DIET   Lives with wife in a 2 story home.  Has 2 children.     Works as a Insurance underwriter.     Education: college  Social Determinants of Health   Financial Resource Strain:   . Difficulty of Paying Living Expenses:   Food Insecurity:   . Worried About Charity fundraiser in the Last Year:   . Arboriculturist in the Last Year:   Transportation Needs:   . Film/video editor (Medical):   Marland Kitchen Lack of Transportation (Non-Medical):   Physical Activity:   . Days of Exercise per Week:   . Minutes of Exercise per Session:   Stress:   . Feeling of Stress :   Social Connections:   . Frequency of Communication with Friends and Family:   . Frequency of Social Gatherings with Friends and Family:   . Attends Religious Services:   . Active Member of Clubs or Organizations:   . Attends Archivist Meetings:   Marland Kitchen Marital Status:     Family History  Problem Relation Age of Onset  . Uterine cancer Mother   . Breast cancer Mother        Deceased, 91  . Stroke Father        > 51  . Prostate cancer Father        Deceased, 65  . Diabetes Daughter        type 1  . Stroke Paternal Uncle        > 55  . Heart attack Maternal Grandfather         >55/mid-60s  . Hyperlipidemia Son   . Healthy Brother   . Colon cancer Neg Hx   . Rectal cancer Neg Hx   . Stomach cancer Neg Hx   . Esophageal cancer Neg Hx     Review of Systems  Constitutional: Negative for fever.  HENT: Negative for congestion, sinus pain and sore throat.   Eyes: Negative for visual disturbance.  Respiratory: Negative for cough, shortness of breath and  wheezing.   Cardiovascular: Negative for chest pain and palpitations.  Gastrointestinal: Negative for nausea.  Neurological: Positive for dizziness and light-headedness. Negative for weakness, numbness and headaches.       Objective:   Vitals:   12/29/19 0941  BP: 130/78  Pulse: 69  Resp: 16  Temp: 97.9 F (36.6 C)  SpO2: 98%   BP Readings from Last 3 Encounters:  12/29/19 130/78  08/10/19 (!) 148/80  03/11/19 (!) 142/70   Wt Readings from Last 3 Encounters:  12/29/19 190 lb (86.2 kg)  08/10/19 200 lb 12.8 oz (91.1 kg)  05/14/19 195 lb (88.5 kg)   Body mass index is 24.39 kg/m.   Physical Exam Constitutional:      General: He is not in acute distress.    Appearance: Normal appearance. He is not ill-appearing.  HENT:     Head: Normocephalic and atraumatic.     Right Ear: Tympanic membrane, ear canal and external ear normal.     Left Ear: Tympanic membrane, ear canal and external ear normal.  Eyes:     Extraocular Movements: Extraocular movements intact.     Conjunctiva/sclera: Conjunctivae normal.     Comments: No nystagmus  Neck:     Vascular: No carotid bruit.  Cardiovascular:     Rate and Rhythm: Normal rate and regular rhythm.     Heart sounds: No murmur.  Pulmonary:     Effort: Pulmonary effort is normal. No respiratory distress.     Breath sounds: Normal breath sounds. No wheezing or rales.  Musculoskeletal:     Cervical back: Neck supple. No tenderness.  Right lower leg: No edema.     Left lower leg: No edema.  Lymphadenopathy:     Cervical: No cervical adenopathy.  Skin:    General: Skin is warm and dry.  Neurological:     General: No focal deficit present.     Mental Status: He is alert.     Sensory: No sensory deficit.     Motor: No weakness.     Gait: Gait normal.  Psychiatric:        Mood and Affect: Mood normal.            Assessment & Plan:    See Problem List for Assessment and Plan of chronic medical problems.    This  visit occurred during the SARS-CoV-2 public health emergency.  Safety protocols were in place, including screening questions prior to the visit, additional usage of staff PPE, and extensive cleaning of exam room while observing appropriate contact time as indicated for disinfecting solutions.

## 2019-12-29 ENCOUNTER — Ambulatory Visit (INDEPENDENT_AMBULATORY_CARE_PROVIDER_SITE_OTHER): Payer: 59 | Admitting: Internal Medicine

## 2019-12-29 ENCOUNTER — Encounter: Payer: Self-pay | Admitting: Internal Medicine

## 2019-12-29 ENCOUNTER — Other Ambulatory Visit: Payer: Self-pay

## 2019-12-29 DIAGNOSIS — H8112 Benign paroxysmal vertigo, left ear: Secondary | ICD-10-CM | POA: Diagnosis not present

## 2019-12-29 DIAGNOSIS — H8110 Benign paroxysmal vertigo, unspecified ear: Secondary | ICD-10-CM | POA: Insufficient documentation

## 2019-12-29 NOTE — Assessment & Plan Note (Signed)
His recent symptoms are consistent with BPPV-this is his first episode He noticed it especially when he was looking to the left Symptoms were intermittent and transient and overall felt off balance Discussed possible causes His symptoms have resolved He had no concerning neurological deficits in his exam shows no deficits He is on a statin, aspirin and had his PFO closed so this is unlikely a CVA No further treatment needed at this time since his symptoms have resolved Discussed in the future the vestibular physical therapy is helpful, nondrowsy Dramamine or meclizine, options for symptoms as well

## 2019-12-29 NOTE — Patient Instructions (Signed)

## 2020-01-19 ENCOUNTER — Encounter: Payer: 59 | Admitting: Physician Assistant

## 2020-01-19 NOTE — Progress Notes (Signed)
This encounter was created in error - please disregard.

## 2020-02-05 ENCOUNTER — Encounter: Payer: Self-pay | Admitting: Internal Medicine

## 2020-02-05 DIAGNOSIS — Z0184 Encounter for antibody response examination: Secondary | ICD-10-CM

## 2020-02-22 ENCOUNTER — Ambulatory Visit: Payer: 59 | Admitting: Neurology

## 2020-04-06 ENCOUNTER — Telehealth: Payer: Self-pay | Admitting: *Deleted

## 2020-04-06 NOTE — Telephone Encounter (Signed)
LVM for call back to schedule appointment with Dr. Manning. 

## 2020-05-02 ENCOUNTER — Other Ambulatory Visit: Payer: Self-pay | Admitting: Internal Medicine

## 2020-05-02 ENCOUNTER — Ambulatory Visit: Payer: 59 | Admitting: Neurology

## 2020-05-02 ENCOUNTER — Encounter: Payer: Self-pay | Admitting: Radiation Oncology

## 2020-05-02 NOTE — Progress Notes (Signed)
GU Location of Tumor / Histology: prostatic adenocarcinoma  If Prostate Cancer, Gleason Score is (3 + 4) and PSA is (5.32). Prostate volume: 30.88 g  Steve [name] has been followed by Dr. Diona Fanti for an overactive bladder, as well as BPH and erectile dysfunction. More recently, he was noted to have an elevated PSA of 5.03 in 07/2019. Repeat in 01/2020 showed continued elevation at 5.32.  Digital rectal examination remained normal.  Biopsies of prostate (if applicable) revealed:  Past/Anticipated interventions by urology, if any: prostate biopsy, referral for consideration of radiation therapy  Past/Anticipated interventions by medical oncology, if any: no  Weight changes, if any: no  Bowel/Bladder complaints, if any: IPSS 7. SHIM 21 with aid of Levitra. Denies dysuria or hematuria. Reports rare scant leakage related to urinary urgency. Denies any bowel complaints.    Nausea/Vomiting, if any: no  Pain issues, if any:  denies  SAFETY ISSUES:  Prior radiation? denies  Pacemaker/ICD? yes, loop recorder. Patient reports this loop recorder is NOT active.  Possible current pregnancy? no, male patient  Is the patient on methotrexate? Denies.  Current Complaints / other details:  68 year old male. Resides in [place]. Married with two children. Father with history of prostate cancer. Mother with history of breast and uterine ca.

## 2020-05-03 ENCOUNTER — Other Ambulatory Visit: Payer: Self-pay

## 2020-05-03 ENCOUNTER — Ambulatory Visit
Admission: RE | Admit: 2020-05-03 | Discharge: 2020-05-03 | Disposition: A | Discharge status: Home / Self Care | Payer: 59 | Source: Ambulatory Visit | Attending: Radiation Oncology | Admitting: Radiation Oncology

## 2020-05-03 ENCOUNTER — Encounter: Payer: Self-pay | Admitting: Radiation Oncology

## 2020-05-03 DIAGNOSIS — Z8546 Personal history of malignant neoplasm of prostate: Secondary | ICD-10-CM | POA: Insufficient documentation

## 2020-05-03 DIAGNOSIS — C61 Malignant neoplasm of prostate: Secondary | ICD-10-CM | POA: Insufficient documentation

## 2020-05-03 HISTORY — DX: Malignant neoplasm of prostate: C61

## 2020-05-03 NOTE — Progress Notes (Signed)
Radiation Oncology         [phone] ________________________________  Initial Outpatient Consultation - Conducted via MyChart due to current COVID-19 concerns for limiting patient exposure  Name: Steve [name] MRN: [id number]  Date: 05/03/2020  DOB: 01/18/1951  CC:Binnie Rail, MD  Franchot Gallo, MD   REFERRING PHYSICIAN: Franchot Gallo, MD  DIAGNOSIS: 69 y.o. gentleman with Stage T1c adenocarcinoma of the prostate with Gleason score of 3+4, and PSA of 5.32.    ICD-10-CM   1. Malignant neoplasm of prostate ([place])  C61     HISTORY OF PRESENT ILLNESS: Steve [name] is a 69 y.o. male with a diagnosis of prostate cancer. He has been followed by Dr. Diona Fanti for an overactive bladder, as well as BPH and erectile dysfunction. More recently, he was noted to have an elevated PSA of 5.03 in 07/2019, increased from 3.63 in 07/2018 and 4.17 in 07/2017. He was seen for his routine annual evaluation with Dr. Diona Fanti on 07/29/2019, digital rectal exam at that time was without nodularity or concerning findings. A repeat PSA in 01/2020 showed stable, persistent elevation at 5.32.  Therefore, the patient proceeded to transrectal ultrasound with 12 biopsies of the prostate on 03/18/2020.  The prostate volume measured 30.88 cc.  Out of 12 core biopsies, 5 were positive.  The maximum Gleason score was 3+4, and this was seen in the right apex and right apex lateral. Additionally, small foci of Gleason 3+3 were seen in the left mid lateral, right base lateral, and right mid lateral.  The patient reviewed the biopsy results with his urologist and he has kindly been referred today for discussion of potential radiation treatment options.   PREVIOUS RADIATION THERAPY: No  PAST MEDICAL HISTORY:  Past Medical History:  Diagnosis Date  . Arthritis    Right shoulder  . Benign hypertrophy of prostate    Dr.Dahlstedt  . Diverticulosis 2013   Moderate   . First degree AV block 12/09/2018    noted on EKG  . GERD (gastroesophageal reflux disease)   . Grade I diastolic dysfunction 74/09/8785   Noted on ECHO   . Hemifacial spasm   . History of loop recorder   . Hx of colonic polyp 2008 & 2013   X2  . Hyperlipidemia   . Internal hemorrhoids 2013  . PFO (patent foramen ovale)    History of   . Prostate cancer ([place])   . 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  . Sleep apnea    Resolved  . Squamous cell skin cancer    Dr.[place]  . Stricture esophagus 2013    dilation X 1  . Stroke [facility]) 2016   left semiovale stroke   . TIA (transient ischemic attack) 01/2015  . Ulcerative esophagitis 2013      PAST SURGICAL HISTORY: Past Surgical History:  Procedure Laterality Date  . COLONOSCOPY W/ POLYPECTOMY  2013   tubular adenoma  . COLONOSCOPY W/ POLYPECTOMY  2008   hyperplastic  . EP IMPLANTABLE DEVICE N/A 01/03/2016   Procedure: Loop Recorder Insertion;  Surgeon: Sanda Klein, MD;  Location: [place] [facility];  Service: Cardiovascular;  Laterality: N/A;  . MOHS SURGERY     x 3  . NASAL SINUS SURGERY    . PATENT FORAMEN OVALE(PFO) CLOSURE N/A 03/27/2018   Procedure: PATENT FORAMEN OVALE (PFO) CLOSURE;  Surgeon: Sherren Mocha, MD;  Location: [place] [facility];  Service: Cardiovascular;  Laterality: N/A;  . REVERSE SHOULDER ARTHROPLASTY Right 03/10/2019  Procedure: REVERSE SHOULDER ARTHROPLASTY;  Surgeon: Marchia Bond, MD;  Location: [facility];  Service: Orthopedics;  Laterality: Right;  . ROTATOR CUFF REPAIR Left   . TEE WITHOUT CARDIOVERSION N/A 01/06/2018   Procedure: TRANSESOPHAGEAL ECHOCARDIOGRAM (TEE);  Surgeon: Sanda Klein, MD;  Location: [place];  Service: Cardiovascular;  Laterality: N/A;  . torn biceps tendon     surgical repair  . uvulectomy & tonsillectomy  2008   Dr Wilburn Cornelia    FAMILY HISTORY:  Family History  Problem Relation Age of Onset  . Uterine cancer Mother   . Breast cancer Mother        Deceased, 38  . Stroke Father         > 37  . Prostate cancer Father        Deceased, 49  . Diabetes Daughter        type 1  . Stroke Paternal Uncle        > 55  . Heart attack Maternal Grandfather         >55/mid-60s  . Hyperlipidemia Son   . Healthy Brother   . Colon cancer Neg Hx   . Rectal cancer Neg Hx   . Stomach cancer Neg Hx   . Esophageal cancer Neg Hx     SOCIAL HISTORY:  Social History   Socioeconomic History  . Marital status: Married    Spouse name: Not on file  . Number of children: Not on file  . Years of education: Not on file  . Highest education level: Not on file  Occupational History  . Occupation: PRESIDENT OF [employer]  Tobacco Use  . Smoking status: Former Smoker    Packs/day: 0.50    Years: 10.00    Pack years: 5.00    Types: Cigarettes    Quit date: 1983    Years since quitting: 38.5  . Smokeless tobacco: Never Used  . Tobacco comment: smoked 1972-1982, up to 1/2 ppd  Vaping Use  . Vaping Use: Never used  Substance and Sexual Activity  . Alcohol use: Yes    Alcohol/week: 5.0 - 6.0 standard drinks    Types: 5 - 6 Standard drinks or equivalent per week    Comment: 3-4 drinks per week  . Drug use: No  . Sexual activity: Not Currently  Other Topics Concern  . Not on file  Social History Narrative   GETS REG EXERCISE 4x weekly   NO DIET   Lives with wife in a 2 story home.  Has 2 children.     Works as a Insurance underwriter.     Education: college      Social Determinants of Radio broadcast assistant Strain:   . Difficulty of Paying Living Expenses:   Food Insecurity:   . Worried About Charity fundraiser in the Last Year:   . Arboriculturist in the Last Year:   Transportation Needs:   . Film/video editor (Medical):   Marland Kitchen Lack of Transportation (Non-Medical):   Physical Activity:   . Days of Exercise per Week:   . Minutes of Exercise per Session:   Stress:   . Feeling of Stress :   Social Connections:   . Frequency of Communication with Friends and  Family:   . Frequency of Social Gatherings with Friends and Family:   . Attends Religious Services:   . Active Member of Clubs or Organizations:   . Attends Archivist Meetings:   Marland Kitchen Marital  Status:   Intimate Partner Violence:   . Fear of Current or Ex-Partner:   . Emotionally Abused:   Marland Kitchen Physically Abused:   . Sexually Abused:     ALLERGIES: Pollen extract  MEDICATIONS:  Current Outpatient Medications  Medication Sig Dispense Refill  . apraclonidine (IOPIDINE) 0.5 % ophthalmic solution apraclonidine 0.5 % eye drops  INSTILL 1 DROP INTO AFFECTED EYE(S) BY OPHTHALMIC ROUTE 3 TIMES PER DAY    . aspirin 81 MG tablet Take 81 mg by mouth daily.      . botulinum toxin Type A (BOTOX) 100 units SOLR injection Botox 100 unit injection  Take 100 units every 3 months by injection route.    . cholecalciferol (VITAMIN D3) 25 MCG (1000 UNIT) tablet Take 1,000 Units by mouth daily.    . Multiple Vitamins-Minerals (PRESERVISION AREDS 2) CAPS Take 1 capsule by mouth 2 (two) times a day.    . pantoprazole (PROTONIX) 40 MG tablet TAKE ONE TABLET EACH DAY 30 tablet 0  . Polyethyl Glycol-Propyl Glycol (SYSTANE OP) Place 2 drops into both eyes at bedtime.    . simvastatin (ZOCOR) 40 MG tablet TAKE ONE TABLET AT BEDTIME 90 tablet 3  . vardenafil (LEVITRA) 20 MG tablet Take 20 mg by mouth daily as needed for erectile dysfunction.     . vitamin B-12 (CYANOCOBALAMIN) 1000 MCG tablet Take 1,000 mcg by mouth daily.    . vitamin C (ASCORBIC ACID) 500 MG tablet Take 500 mg by mouth daily.    . influenza vaccine adjuvanted (FLUAD QUADRIVALENT) 0.5 ML injection Fluad Quad 2020-2021(84yr up)(PF) 60 mcg (15 mcg x 4)/0.3mL IM syringe  ADM 0.5ML IM UTD (Patient not taking: Reported on 05/03/2020)     No current facility-administered medications for this encounter.    REVIEW OF SYSTEMS:  On review of systems, the patient reports that he is doing well overall. He denies any chest pain, shortness of breath,  cough, fevers, chills, night sweats, unintended weight changes. He denies any bowel disturbances, and denies abdominal pain, nausea or vomiting. He denies any new musculoskeletal or joint aches or pains. His IPSS was 7, indicating mild urinary symptoms. His SHIM was 21, indicating he has mild erectile dysfunction which is managed satisfactorily with Viagra. A complete review of systems is obtained and is otherwise negative.    PHYSICAL EXAM:  Wt Readings from Last 3 Encounters:  05/03/20 195 lb (88.5 kg)  12/29/19 190 lb (86.2 kg)  08/10/19 200 lb 12.8 oz (91.1 kg)   Temp Readings from Last 3 Encounters:  12/29/19 97.9 F (36.6 C) (Oral)  08/10/19 98.1 F (36.7 C) (Oral)  03/11/19 97.8 F (36.6 C) (Oral)   BP Readings from Last 3 Encounters:  12/29/19 130/78  08/10/19 (!) 148/80  03/11/19 (!) 142/70   Pulse Readings from Last 3 Encounters:  12/29/19 69  08/10/19 72  03/11/19 66   Pain Assessment Pain Score: 0-No pain/10  In general this is a well appearing Caucasian gentleman in no acute distress. He's alert and oriented x4 and appropriate throughout the examination. Cardiopulmonary assessment is negative for acute distress and he exhibits normal effort.    KPS = 100  100 - Normal; no complaints; no evidence of disease. 90   - Able to carry on normal activity; minor signs or symptoms of disease. 80   - Normal activity with effort; some signs or symptoms of disease. 5   - Cares for self; unable to carry on normal activity or to do active  work. 60   - Requires occasional assistance, but is able to care for most of his personal needs. 50   - Requires considerable assistance and frequent medical care. 2   - Disabled; requires special care and assistance. 53   - Severely disabled; hospital admission is indicated although death not imminent. 33   - Very sick; hospital admission necessary; active supportive treatment necessary. 10   - Moribund; fatal processes progressing  rapidly. 0     - Dead  Karnofsky DA, Abelmann Buckley, Craver LS and [facility] [phone]) The use of the nitrogen mustards in the palliative treatment of carcinoma: with particular reference to bronchogenic carcinoma Cancer 1 [phone]  LABORATORY DATA:  Lab Results  Component Value Date   WBC 4.8 08/21/2019   HGB 15.6 08/21/2019   HCT 46.0 08/21/2019   MCV 92.4 08/21/2019   PLT 196.0 08/21/2019   Lab Results  Component Value Date   NA 138 08/21/2019   K 4.0 08/21/2019   CL 102 08/21/2019   CO2 27 08/21/2019   Lab Results  Component Value Date   ALT 54 (H) 08/21/2019   AST 33 08/21/2019   ALKPHOS 69 08/21/2019   BILITOT 0.6 08/21/2019     RADIOGRAPHY: No results found.    IMPRESSION/PLAN: This visit was conducted via MyChart to spare the patient unnecessary potential exposure in the healthcare setting during the current COVID-19 pandemic. 1. 69 y.o. gentleman with Stage T1c adenocarcinoma of the prostate with Gleason Score of 3+4, and PSA of 5.32. We discussed the patient's workup and outlined the nature of prostate cancer in this setting. The patient's T stage, Gleason's score, and PSA put him into the favorable intermediate risk group. Accordingly, he is eligible for a variety of potential treatment options including brachytherapy, 5.5 weeks of external radiation, or prostatectomy. We discussed the available radiation techniques, and focused on the details and logistics of delivery. We discussed and outlined the risks, benefits, short and long-term effects associated with radiotherapy and compared and contrasted these with prostatectomy. We discussed the role of SpaceOAR in reducing the rectal toxicity associated with radiotherapy.  He was encouraged to ask questions are answered to his stated satisfaction.  At the end of the conversation, the patient appears to have a good understanding of his disease and our treatment recommendations which are of curative intent.  He remains undecided  regarding his treatment preference and is interested in also meeting with a surgeon to discuss robotic prostatectomy so that he is well-informed prior to making his final decision.  We feel that the patient is a good candidate for any of the three treatment options, all of which would be equally effective.  We will share our discussion with Dr. Diona Fanti so that he can arrange for a consult visit with one of his robotics colleagues and we will plan to reach back out and follow-up with the patient in the next 3 to 4 weeks if we have not heard back from him in the interim.  We enjoyed meeting him today and would be more than happy to continue to participate in his care should he elect to proceed with radiotherapy.  Given current concerns for patient exposure during the COVID-19 pandemic, this encounter was conducted via video-enabled MyChart visit. The patient has given verbal consent for this type of encounter. The time spent during this encounter was 60 minutes. The attendants for this meeting include Tyler Pita MD, Ashlyn Bruning PA-C, [place], and patient, Steve [name].  During the encounter, Tyler Pita MD, Ashlyn Bruning PA-C, and scribe, Wilburn Mylar were located at [place].  Patient, Steve [name] was located at home.    Nicholos Johns, PA-C    Tyler Pita, MD  [facility] Direct Dial: [phone]  Fax: [phone] [email or web address]  Skype  [email or web address]  This document serves as a record of services personally performed by Tyler Pita, MD and Freeman Caldron, PA-C. It was created on their behalf by Wilburn Mylar, a trained medical scribe. The creation of this record is based on the scribe's personal observations and the provider's statements to them. This document has been checked and approved by the attending provider.

## 2020-05-09 ENCOUNTER — Encounter: Payer: Self-pay | Admitting: Medical Oncology

## 2020-06-06 ENCOUNTER — Other Ambulatory Visit: Payer: Self-pay | Admitting: Internal Medicine

## 2020-06-09 ENCOUNTER — Encounter: Payer: Self-pay | Admitting: Internal Medicine

## 2020-07-01 ENCOUNTER — Encounter: Payer: Self-pay | Admitting: Neurology

## 2020-07-01 ENCOUNTER — Ambulatory Visit (INDEPENDENT_AMBULATORY_CARE_PROVIDER_SITE_OTHER): Payer: Commercial Managed Care - PPO | Admitting: Neurology

## 2020-07-01 ENCOUNTER — Other Ambulatory Visit: Payer: Self-pay

## 2020-07-01 VITALS — BP 147/78 | HR 75 | Ht 75.0 in | Wt 195.0 lb

## 2020-07-01 DIAGNOSIS — I253 Aneurysm of heart: Secondary | ICD-10-CM

## 2020-07-01 DIAGNOSIS — Q211 Atrial septal defect: Secondary | ICD-10-CM | POA: Diagnosis not present

## 2020-07-01 DIAGNOSIS — Z8673 Personal history of transient ischemic attack (TIA), and cerebral infarction without residual deficits: Secondary | ICD-10-CM | POA: Diagnosis not present

## 2020-07-01 DIAGNOSIS — Q2112 Patent foramen ovale: Secondary | ICD-10-CM

## 2020-07-01 NOTE — Patient Instructions (Signed)
It was great to see you today!  I'll see you back in 1 year 

## 2020-07-01 NOTE — Progress Notes (Signed)
Follow-up Visit   Date: 07/01/20    Steve [name] MRN: [id number] DOB: 1950/10/29   Interim History: Steve [name] is a 69 y.o. right-handed Caucasian male with right hemifacial spasm, hyperlipidemia, BPH, GERD, and left semiovale stroke (2016, manifesting with right arm weakness and facial numbness), prostate cancer (2021) returning to the clinic for follow-up of cryptogenic stroke.   History of present illness: On April 23rd 2015, he woke up with right arm heaviness/weakness and right sided paresthesias. There was no weakness of the right lower extremity or the face.  Symptoms resolved within about 6 hours. He has noticed that his signature and hand writing is showing subtle differences.He stopped taking aspirin several years ago and restarted aspirin 81mg  when these symptoms started. MRI brain showed small left centrum semiovale stroke.  There is no evidence of large vessel occlusion. Echocardiogram is normal except moderate right-to-left shunt without atrial septal aneurysm. He had PFO closure on 03/27/18.  He has seen Dr. Erling Cruz in the late 1990s for hemifacial spasm and gets botox injected.  In 2019, he began having left sided tightness of the thigh . Sometimes, he has difficulty raising the left leg, such as with wearing pants.  There is some associated low back pain.  He has seen [facility] in the past and had MRI lumbar spine which showed degenerative changes at L4-5.  UPDATE 07/01/2020:  He is here for follow-up visit.  He has been doing well from a neurological standpoint. He reports having one episode of self-limiting disequilibrium.  No new weakness or paresthesias. His battery to loop recorder has died and he is thinking of having it removed.  He was diagnosed with prostate cancer this summer and is contemplating management options, but seems to be leaning towards radiation. He continues to receive Botox for right hemifacial spasm.   Medications:  Current  Outpatient Medications on File Prior to Visit  Medication Sig Dispense Refill  . apraclonidine (IOPIDINE) 0.5 % ophthalmic solution apraclonidine 0.5 % eye drops  INSTILL 1 DROP INTO AFFECTED EYE(S) BY OPHTHALMIC ROUTE 3 TIMES PER DAY    . aspirin 81 MG tablet Take 81 mg by mouth daily.      . botulinum toxin Type A (BOTOX) 100 units SOLR injection Botox 100 unit injection  Take 100 units every 3 months by injection route.    . cholecalciferol (VITAMIN D3) 25 MCG (1000 UNIT) tablet Take 1,000 Units by mouth daily.    . influenza vaccine adjuvanted (FLUAD QUADRIVALENT) 0.5 ML injection Fluad Quad 2020-2021(55yr up)(PF) 60 mcg (15 mcg x 4)/0.33mL IM syringe  ADM 0.5ML IM UTD    . Multiple Vitamins-Minerals (PRESERVISION AREDS 2) CAPS Take 1 capsule by mouth 2 (two) times a day.    . pantoprazole (PROTONIX) 40 MG tablet TAKE ONE TABLET EACH DAY 30 tablet 0  . Polyethyl Glycol-Propyl Glycol (SYSTANE OP) Place 2 drops into both eyes at bedtime.    . simvastatin (ZOCOR) 40 MG tablet TAKE ONE TABLET AT BEDTIME 90 tablet 3  . vardenafil (LEVITRA) 20 MG tablet Take 20 mg by mouth daily as needed for erectile dysfunction.     . vitamin B-12 (CYANOCOBALAMIN) 1000 MCG tablet Take 1,000 mcg by mouth daily.    . vitamin C (ASCORBIC ACID) 500 MG tablet Take 500 mg by mouth daily.     No current facility-administered medications on file prior to visit.    Allergies:  Allergies  Allergen Reactions  . Pollen Extract  Vital Signs:  BP (!) 147/78   Pulse 75   Ht 6\' 3"  (1.905 m)   Wt 195 lb (88.5 kg)   SpO2 95%   BMI 24.37 kg/m    General Medical Exam:   General:  Well appearing, comfortable  Eyes/ENT: see cranial nerve examination.   Neck:   No carotid bruits. Respiratory:  Clear to auscultation, good air entry bilaterally.   Cardiac:  Regular rate and rhythm, no murmur.   Ext:  No edema   Neurological Exam: MENTAL STATUS including orientation to time, place, person, recent and remote  memory, attention span and concentration, language, and fund of knowledge is normal.  Speech is not dysarthric.  CRANIAL NERVES:   Normal conjugate, extra-ocular eye movements in all directions of gaze. Right facial asymmetry involving the forehead and cheek, asymmetrical smile on the right.  Moderate right ptosis.  Intermittent and right hemifacial spasm.  Palate elevates symmetrically.    MOTOR:  Motor strength is 5/5 in all extremities.  No pronator drift.  Tone is normal.    COORDINATION/GAIT:  Normal finger-to- nose-finger.  Intact rapid alternating movements bilaterally.  Gait narrow based and stable. Tandem gait intact.  Data: MRI/A brain and neck 03/18/2015: 1. Mild white matter signal changes in the left posterior centrum semiovale with subtle diffusion signal changes suggestive of subacute white matter infarct. No mass effect or hemorrhage. 2. Otherwise normal for age noncontrast MRI appearance of the brain. 3. Left greater than right carotid bifurcation atherosclerosis with no hemodynamically significant stenosis in the neck. 4. Negative intracranial MRA.  Lab Results  Component Value Date   CHOL 167 08/21/2019   HDL 48.80 08/21/2019   LDLCALC 94 08/21/2019   LDLDIRECT 172.0 07/24/2007   TRIG 123.0 08/21/2019   CHOLHDL 3 08/21/2019   Lab Results  Component Value Date   HGBA1C 5.9 08/21/2019   Echocardiogram 03/14/2015: - Left ventricle: The cavity size was normal. Systolic function was normal. The estimated ejection fraction was in the range of 55%to 60%. Wall motion was normal; there were no regional wall motion abnormalities. Left ventricular diastolic function parameters were normal. - Atrial septum: There was a moderate right-to-left atrial level shunt, in the baseline state. There was redundancy of the septum, with borderline criteria for aneurysm.  TEE 01/13/2018: - Left ventricle: The cavity size was normal. Wall thickness was normal. Systolic function was  normal. The estimated ejection   fraction was in the range of 55% to 60%. Wall motion was normal; there were no regional wall motion abnormalities.  - Left atrium: No evidence of thrombus in the atrial cavity or appendage. - Right atrium: No evidence of thrombus in the atrial cavity or appendage. - Atrial septum: There was a small secundum atrial septal defect. Doppler showed a small bidirectional atrial level shunt, in the   baseline state. 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  MRI lumbar spine performed at [facility] 08/14/2013: 1.  L4-5: Compression of both L4 nerve roots by a disc bulge and bilateral facet DJD 2.  Diffuse facet DJD, severe on the right at L2-3 and on the left at L4-5 and on the right at health 5-S1 3.  L3-4: small left foraminal and lateral protrusion with minimal foraminal stenosis 4.  L2-3: Mild right lateral recess stenosis caused by facet DJD.    IMPRESSION/PLAN: 1.  History of small left posterior centrum semiovale stroke manifesting  with transient R arm weakness and hemisensory loss in April 2016. 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  He is s/p PFO closure in 2019.  No cardiac arrhythmia on loop recorder. He may have loop recorder removed, given uneventful recording thus far.  Continue aspirin 81mg  daily and zocar 40mg  daily (LDL 94)  2. Right hemifacial spasm, followed by Dr. Zonia Kief in [place], [place]  3. Prostate cancer - new diagnosis.   Return to clinic in 1 year   Thank you for allowing me to participate in patient's care.  If I can answer any additional questions, I would be pleased to do so.    Sincerely,    Gita Dilger K. Posey Pronto, DO

## 2020-07-04 ENCOUNTER — Other Ambulatory Visit: Payer: Self-pay | Admitting: Internal Medicine

## 2020-07-05 ENCOUNTER — Encounter: Payer: Self-pay | Admitting: Medical Oncology

## 2020-07-05 NOTE — Progress Notes (Signed)
Patient had follow with Dr. Alinda Money today. He is considering seed implant or external beam. He would like to reconsult with Dr. Tammi Klippel so he can further discuss these 2 treatment options.  I will be happy to assist in getting consult scheduled. He voiced understanding and  was very appreciative of my call.

## 2020-07-05 NOTE — Progress Notes (Signed)
Left voicemail asking for a return call to discuss his tx decision.

## 2020-07-15 ENCOUNTER — Encounter: Payer: Self-pay | Admitting: Urology

## 2020-07-15 ENCOUNTER — Ambulatory Visit
Admission: RE | Admit: 2020-07-15 | Discharge: 2020-07-15 | Disposition: A | Discharge status: Home / Self Care | Payer: Commercial Managed Care - PPO | Source: Ambulatory Visit | Attending: Urology | Admitting: Urology

## 2020-07-15 DIAGNOSIS — C61 Malignant neoplasm of prostate: Secondary | ICD-10-CM | POA: Diagnosis present

## 2020-07-15 DIAGNOSIS — Z8546 Personal history of malignant neoplasm of prostate: Secondary | ICD-10-CM | POA: Diagnosis present

## 2020-07-15 NOTE — Progress Notes (Signed)
Radiation Oncology         (336) [phone] ________________________________  Outpatient Re-Consultation - Conducted via MyChart due to current COVID-19 concerns for limiting patient exposure  Name: Steve [name] MRN: [id number]  Date: 07/15/2020  DOB: 12-11-1950  CC:Binnie Rail, MD  Franchot Gallo, MD   REFERRING PHYSICIAN: Franchot Gallo, MD  DIAGNOSIS: 69 y.o. gentleman with Stage T1c adenocarcinoma of the prostate with Gleason score of 3+4, and PSA of 5.32.    ICD-10-CM   1. Malignant neoplasm of prostate ([place])  C61     HISTORY OF PRESENT ILLNESS: Steve [name] is a 69 y.o. male with a diagnosis of prostate cancer. He has been followed by Dr. Diona Fanti for an overactive bladder, as well as BPH and erectile dysfunction. More recently, he was noted to have an elevated PSA of 5.03 in 07/2019, increased from 3.63 in 07/2018 and 4.17 in 07/2017. He was seen for his routine annual evaluation with Dr. Diona Fanti on 07/29/2019, digital rectal exam at that time was without nodularity or concerning findings. A repeat PSA in 01/2020 showed stable, persistent elevation at 5.32.  Therefore, the patient proceeded to transrectal ultrasound with 12 biopsies of the prostate on 03/18/2020.  The prostate volume measured 30.88 cc.  Out of 12 core biopsies, 5 were positive.  The maximum Gleason score was 3+4, and this was seen in the right apex and right apex lateral. Additionally, small foci of Gleason 3+3 were seen in the left mid lateral, right base lateral, and right mid lateral.  We met with the patient on 05/03/2020 to discuss radiation therapy options. At that time, he remained undecided regarding which treatment option he wanted to pursue. In the meantime, he has met with other physicians, including Dr. Alinda Money, for further opinions regarding his options. He returns today to further discuss brachytherapy and external beam radiation.  PREVIOUS RADIATION THERAPY: No  PAST MEDICAL HISTORY:   Past Medical History:  Diagnosis Date  . Arthritis    Right shoulder  . Benign hypertrophy of prostate    Dr.Dahlstedt  . Diverticulosis 2013   Moderate   . First degree AV block 12/09/2018   noted on EKG  . GERD (gastroesophageal reflux disease)   . Grade I diastolic dysfunction 25/42/7062   Noted on ECHO   . Hemifacial spasm   . History of loop recorder   . Hx of colonic polyp 2008 & 2013   X2  . Hyperlipidemia   . Internal hemorrhoids 2013  . PFO (patent foramen ovale)    History of   . Prostate cancer ([place])   . 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  . Sleep apnea    Resolved  . Squamous cell skin cancer    Dr.King [place]  . Stricture esophagus 2013    dilation X 1  . Stroke [facility]) 2016   left semiovale stroke   . TIA (transient ischemic attack) 01/2015  . Ulcerative esophagitis 2013      PAST SURGICAL HISTORY: Past Surgical History:  Procedure Laterality Date  . COLONOSCOPY W/ POLYPECTOMY  2013   tubular adenoma  . COLONOSCOPY W/ POLYPECTOMY  2008   hyperplastic  . EP IMPLANTABLE DEVICE N/A 01/03/2016   Procedure: Loop Recorder Insertion;  Surgeon: Sanda Klein, MD;  Location: [place] CV [facility];  Service: Cardiovascular;  Laterality: N/A;  . MOHS SURGERY     x 3  . NASAL SINUS SURGERY    . PATENT FORAMEN OVALE(PFO) CLOSURE N/A 03/27/2018   Procedure:  PATENT FORAMEN OVALE (PFO) CLOSURE;  Surgeon: Sherren Mocha, MD;  Location: [place] CV LAB;  Service: Cardiovascular;  Laterality: N/A;  . REVERSE SHOULDER ARTHROPLASTY Right 03/10/2019   Procedure: REVERSE SHOULDER ARTHROPLASTY;  Surgeon: Marchia Bond, MD;  Location: [facility];  Service: Orthopedics;  Laterality: Right;  . ROTATOR CUFF REPAIR Left   . TEE WITHOUT CARDIOVERSION N/A 01/06/2018   Procedure: TRANSESOPHAGEAL ECHOCARDIOGRAM (TEE);  Surgeon: Sanda Klein, MD;  Location: [place];  Service: Cardiovascular;  Laterality: N/A;  . torn biceps tendon     surgical repair  . uvulectomy &  tonsillectomy  2008   Dr Wilburn Cornelia    FAMILY HISTORY:  Family History  Problem Relation Age of Onset  . Uterine cancer Mother   . Breast cancer Mother        Deceased, 44  . Stroke Father        > 69  . Prostate cancer Father        Deceased, 17  . Diabetes Daughter        type 1  . Stroke Paternal Uncle        > 55  . Heart attack Maternal Grandfather         >55/mid-60s  . Hyperlipidemia Son   . Healthy Brother   . Colon cancer Neg Hx   . Rectal cancer Neg Hx   . Stomach cancer Neg Hx   . Esophageal cancer Neg Hx     SOCIAL HISTORY:  Social History   Socioeconomic History  . Marital status: Married    Spouse name: Not on file  . Number of children: Not on file  . Years of education: Not on file  . Highest education level: Not on file  Occupational History  . Occupation: PRESIDENT [employer]  Tobacco Use  . Smoking status: Former Smoker    Packs/day: 0.50    Years: 10.00    Pack years: 5.00    Types: Cigarettes    Quit date: 1983    Years since quitting: 38.7  . Smokeless tobacco: Never Used  . Tobacco comment: smoked 1972-1982, up to 1/2 ppd  Vaping Use  . Vaping Use: Never used  Substance and Sexual Activity  . Alcohol use: Yes    Alcohol/week: 5.0 - 6.0 standard drinks    Types: 5 - 6 Standard drinks or equivalent per week    Comment: 3-4 drinks per week  . Drug use: No  . Sexual activity: Not Currently  Other Topics Concern  . Not on file  Social History Narrative   GETS REG EXERCISE 4x weekly   NO DIET   Lives with wife in a 2 story home.  Has 2 children.     Works as a Insurance underwriter.     Education: college   Right Handed   Drinks Caffeine    Lives in a two story home   Social Determinants of Health   Financial Resource Strain:   . Difficulty of Paying Living Expenses: Not on file  Food Insecurity:   . Worried About Charity fundraiser in the Last Year: Not on file  . Ran Out of Food in the Last Year: Not on file   Transportation Needs:   . Lack of Transportation (Medical): Not on file  . Lack of Transportation (Non-Medical): Not on file  Physical Activity:   . Days of Exercise per Week: Not on file  . Minutes of Exercise per Session: Not on file  Stress:   .  Feeling of Stress : Not on file  Social Connections:   . Frequency of Communication with Friends and Family: Not on file  . Frequency of Social Gatherings with Friends and Family: Not on file  . Attends Religious Services: Not on file  . Active Member of Clubs or Organizations: Not on file  . Attends Archivist Meetings: Not on file  . Marital Status: Not on file  Intimate Partner Violence:   . Fear of Current or Ex-Partner: Not on file  . Emotionally Abused: Not on file  . Physically Abused: Not on file  . Sexually Abused: Not on file    ALLERGIES: Pollen extract  MEDICATIONS:  Current Outpatient Medications  Medication Sig Dispense Refill  . apraclonidine (IOPIDINE) 0.5 % ophthalmic solution apraclonidine 0.5 % eye drops  INSTILL 1 DROP INTO AFFECTED EYE(S) BY OPHTHALMIC ROUTE 3 TIMES PER DAY    . aspirin 81 MG tablet Take 81 mg by mouth daily.      . botulinum toxin Type A (BOTOX) 100 units SOLR injection Botox 100 unit injection  Take 100 units every 3 months by injection route.    . cholecalciferol (VITAMIN D3) 25 MCG (1000 UNIT) tablet Take 1,000 Units by mouth daily.    . influenza vaccine adjuvanted (FLUAD QUADRIVALENT) 0.5 ML injection Fluad Quad 2020-2021(21yrup)(PF) 60 mcg (15 mcg x 4)/0.592mIM syringe  ADM 0.5ML IM UTD    . Multiple Vitamins-Minerals (PRESERVISION AREDS 2) CAPS Take 1 capsule by mouth 2 (two) times a day.    . pantoprazole (PROTONIX) 40 MG tablet TAKE ONE TABLET EACH DAY 30 tablet 0  . Polyethyl Glycol-Propyl Glycol (SYSTANE OP) Place 2 drops into both eyes at bedtime.    . simvastatin (ZOCOR) 40 MG tablet TAKE ONE TABLET AT BEDTIME 90 tablet 3  . vardenafil (LEVITRA) 20 MG tablet Take 20 mg  by mouth daily as needed for erectile dysfunction.     . vitamin B-12 (CYANOCOBALAMIN) 1000 MCG tablet Take 1,000 mcg by mouth daily.    . vitamin C (ASCORBIC ACID) 500 MG tablet Take 500 mg by mouth daily.     No current facility-administered medications for this encounter.    REVIEW OF SYSTEMS:  On review of systems, the patient reports that he is doing well overall. He denies any chest pain, shortness of breath, cough, fevers, chills, night sweats, unintended weight changes. He denies any bowel disturbances, and denies abdominal pain, nausea or vomiting. He denies any new musculoskeletal or joint aches or pains. A complete review of systems is obtained and is otherwise negative. From his original consultation on 05/03/2020: His IPSS was 7, indicating mild urinary symptoms. His SHIM was 21, indicating he has mild erectile dysfunction which is managed satisfactorily with Viagra.    PHYSICAL EXAM:  Wt Readings from Last 3 Encounters:  07/01/20 195 lb (88.5 kg)  05/03/20 195 lb (88.5 kg)  12/29/19 190 lb (86.2 kg)   Temp Readings from Last 3 Encounters:  12/29/19 97.9 F (36.6 C) (Oral)  08/10/19 98.1 F (36.7 C) (Oral)  03/11/19 97.8 F (36.6 C) (Oral)   BP Readings from Last 3 Encounters:  07/01/20 (!) 147/78  12/29/19 130/78  08/10/19 (!) 148/80   Pulse Readings from Last 3 Encounters:  07/01/20 75  12/29/19 69  08/10/19 72   Pain Assessment Pain Score: 0-No pain/10  In general this is a well appearing Caucasian gentleman in no acute distress. He's alert and oriented x4 and appropriate throughout the  examination. Cardiopulmonary assessment is negative for acute distress and he exhibits normal effort.    KPS = 100  100 - Normal; no complaints; no evidence of disease. 90   - Able to carry on normal activity; minor signs or symptoms of disease. 80   - Normal activity with effort; some signs or symptoms of disease. 45   - Cares for self; unable to carry on normal activity  or to do active work. 60   - Requires occasional assistance, but is able to care for most of his personal needs. 50   - Requires considerable assistance and frequent medical care. 50   - Disabled; requires special care and assistance. 54   - Severely disabled; hospital admission is indicated although death not imminent. 63   - Very sick; hospital admission necessary; active supportive treatment necessary. 10   - Moribund; fatal processes progressing rapidly. 0     - Dead  Karnofsky DA, Abelmann [place], Craver LS and Burchenal [facility] [phone]) The use of the nitrogen mustards in the palliative treatment of carcinoma: with particular reference to bronchogenic carcinoma Cancer 1 634-56  LABORATORY DATA:  Lab Results  Component Value Date   WBC 4.8 08/21/2019   HGB 15.6 08/21/2019   HCT 46.0 08/21/2019   MCV 92.4 08/21/2019   PLT 196.0 08/21/2019   Lab Results  Component Value Date   NA 138 08/21/2019   K 4.0 08/21/2019   CL 102 08/21/2019   CO2 27 08/21/2019   Lab Results  Component Value Date   ALT 54 (H) 08/21/2019   AST 33 08/21/2019   ALKPHOS 69 08/21/2019   BILITOT 0.6 08/21/2019     RADIOGRAPHY: No results found.    IMPRESSION/PLAN: This visit was conducted via MyChart to spare the patient unnecessary potential exposure in the healthcare setting during the current COVID-19 pandemic. 1. 69 y.o. gentleman with Stage T1c adenocarcinoma of the prostate with Gleason Score of 3+4, and PSA of 5.32. We discussed the patient's workup and outlined the nature of prostate cancer in this setting. The patient's T stage, Gleason's score, and PSA put him into the favorable intermediate risk group. Accordingly, he is eligible for a variety of potential treatment options including brachytherapy, 5.5 weeks of external radiation, or prostatectomy. We discussed the available radiation techniques, and focused on the details and logistics of delivery. We discussed and outlined the risks, benefits, short and  long-term effects associated with radiotherapy and compared and contrasted these with prostatectomy. We discussed the role of SpaceOAR in reducing the rectal toxicity associated with radiotherapy.  He was encouraged to ask questions are answered to his stated satisfaction.  At the end of the conversation, the patient is interested in proceeding with brachytherapy and use of SpaceOAR gel to reduce rectal toxicity from radiotherapy.  We will share our discussion with Dr. Diona Fanti and move forward with scheduling his CT [facility] planning appointment in the near future.  The patient will be contacted by Romie Jumper in our office who will be working closely with him to coordinate OR scheduling and pre and post procedure appointments.  We will contact the pharmaceutical rep to ensure that [place] is available at the time of procedure.  We enjoyed talking with him today and look forward to continuing to participate in his care.  Given current concerns for patient exposure during the COVID-19 pandemic, this encounter was conducted via video-enabled MyChart visit. The patient has given verbal consent for this type of encounter. The time spent during  this encounter was 45 minutes. The attendants for this meeting include Tyler Pita MD, Ashlyn Bruning PA-C, [place], and patient, [name]. During the encounter, Tyler Pita MD, Ashlyn Bruning PA-C, and scribe, Wilburn Mylar were located at [place].  Patient, Steve [name] was located at home.    Nicholos Johns, PA-C    Tyler Pita, MD  [facility] Direct Dial: [phone]  Fax: [phone] [email or web address]  Skype  LinkedIn  This document serves as a record of services personally performed by Tyler Pita, MD and Freeman Caldron, PA-C. It was created on their behalf by Wilburn Mylar, a trained medical scribe. The creation of this  record is based on the scribe's personal observations and the provider's statements to them. This document has been checked and approved by the attending provider.

## 2020-07-18 ENCOUNTER — Telehealth: Payer: Self-pay | Admitting: *Deleted

## 2020-07-18 NOTE — Telephone Encounter (Signed)
CALLED PATIENT TO ASK QUESTIONS, SPOKE WITH PATIENT 

## 2020-07-19 ENCOUNTER — Other Ambulatory Visit: Payer: Self-pay | Admitting: Internal Medicine

## 2020-07-21 ENCOUNTER — Other Ambulatory Visit: Payer: Self-pay

## 2020-07-21 ENCOUNTER — Telehealth: Payer: Self-pay

## 2020-07-21 DIAGNOSIS — R197 Diarrhea, unspecified: Secondary | ICD-10-CM

## 2020-07-21 MED ORDER — METRONIDAZOLE 250 MG PO TABS
250.0000 mg | ORAL_TABLET | Freq: Three times a day (TID) | ORAL | 0 refills | Status: DC
Start: 2020-07-21 — End: 2020-09-12

## 2020-07-21 NOTE — Telephone Encounter (Signed)
-----   Message from Irene Shipper, MD sent at 07/20/2020  3:53 PM EDT ----- Regarding: Labs and stool studies Steve [name], Steve [name] contacted me regarding a 57-month history of more frequent loose stools.  No alarm features.  He has an appointment to see me November 2.  In the interim, please do the following:1.  CBC, comprehensive metabolic panel, CRP, TSH, and tissue transglutaminase antibody IgA2.  Stools for enteric pathogens3.  Prescribe metronidazole 250 mg 3 times daily for 1 week; #21; no refillsThank you,Dr. Henrene Pastor

## 2020-07-21 NOTE — Telephone Encounter (Signed)
Pt aware, lab orders in epic, script sent to pharmacy.

## 2020-07-22 ENCOUNTER — Other Ambulatory Visit: Payer: Self-pay | Admitting: Urology

## 2020-07-22 ENCOUNTER — Telehealth: Payer: Self-pay | Admitting: *Deleted

## 2020-07-22 NOTE — Telephone Encounter (Signed)
CALLED PATIENT TO UPDATE, SPOKE WITH PATIENT 

## 2020-07-22 NOTE — Telephone Encounter (Signed)
CALLED PATIENT TO INFORM THAT PRE-SEED APPTS. HAVE BEEN MOVED TO 08-11-20, LVM FOR A  RETURN CALL

## 2020-07-27 ENCOUNTER — Other Ambulatory Visit (INDEPENDENT_AMBULATORY_CARE_PROVIDER_SITE_OTHER): Payer: Commercial Managed Care - PPO

## 2020-07-27 DIAGNOSIS — R197 Diarrhea, unspecified: Secondary | ICD-10-CM

## 2020-07-27 LAB — CBC WITH DIFFERENTIAL/PLATELET
Basophils Absolute: 0.1 10*3/uL (ref 0.0–0.1)
Basophils Relative: 1 % (ref 0.0–3.0)
Eosinophils Absolute: 0.1 10*3/uL (ref 0.0–0.7)
Eosinophils Relative: 2.1 % (ref 0.0–5.0)
HCT: 43.1 % (ref 39.0–52.0)
Hemoglobin: 14.9 g/dL (ref 13.0–17.0)
Lymphocytes Relative: 24.5 % (ref 12.0–46.0)
Lymphs Abs: 1.5 10*3/uL (ref 0.7–4.0)
MCHC: 34.6 g/dL (ref 30.0–36.0)
MCV: 90.8 fl (ref 78.0–100.0)
Monocytes Absolute: 0.6 10*3/uL (ref 0.1–1.0)
Monocytes Relative: 10 % (ref 3.0–12.0)
Neutro Abs: 3.8 10*3/uL (ref 1.4–7.7)
Neutrophils Relative %: 62.4 % (ref 43.0–77.0)
Platelets: 182 10*3/uL (ref 150.0–400.0)
RBC: 4.75 Mil/uL (ref 4.22–5.81)
RDW: 13.4 % (ref 11.5–15.5)
WBC: 6.2 10*3/uL (ref 4.0–10.5)

## 2020-07-27 LAB — C-REACTIVE PROTEIN: CRP: <1 mg/dL (ref 0.5–20.0)

## 2020-07-27 LAB — TSH: TSH: 3.38 u[IU]/mL (ref 0.35–4.50)

## 2020-07-27 LAB — COMPREHENSIVE METABOLIC PANEL
ALT: 26 U/L (ref 0–53)
AST: 22 U/L (ref 0–37)
Albumin: 4.4 g/dL (ref 3.5–5.2)
Alkaline Phosphatase: 65 U/L (ref 39–117)
BUN: 13 mg/dL (ref 6–23)
CO2: 27 mEq/L (ref 19–32)
Calcium: 9 mg/dL (ref 8.4–10.5)
Chloride: 103 mEq/L (ref 96–112)
Creatinine, Ser: 0.89 mg/dL (ref 0.40–1.50)
GFR: 86.3 mL/min (ref >60.00)
Glucose, Bld: 104 mg/dL — ABNORMAL HIGH (ref 70–99)
Potassium: 4 mEq/L (ref 3.5–5.1)
Sodium: 139 mEq/L (ref 135–145)
Total Bilirubin: 0.6 mg/dL (ref 0.2–1.2)
Total Protein: 6.9 g/dL (ref 6.0–8.3)

## 2020-07-28 ENCOUNTER — Ambulatory Visit: Payer: 59 | Admitting: Neurology

## 2020-07-28 LAB — TISSUE TRANSGLUTAMINASE, IGA: (tTG) Ab, IgA: <1 U/mL

## 2020-07-31 LAB — GI PROFILE, STOOL, PCR
Adenovirus F 40/41: NOT DETECTED
Astrovirus: NOT DETECTED
C difficile toxin A/B: NOT DETECTED
Campylobacter: NOT DETECTED
Cryptosporidium: NOT DETECTED
Cyclospora cayetanensis: NOT DETECTED
Entamoeba histolytica: NOT DETECTED
Enteroaggregative E coli: NOT DETECTED
Enteropathogenic E coli: DETECTED — AB
Enterotoxigenic E coli: NOT DETECTED
Giardia lamblia: NOT DETECTED
Norovirus GI/GII: NOT DETECTED
Plesiomonas shigelloides: NOT DETECTED
Rotavirus A: NOT DETECTED
Salmonella: NOT DETECTED
Sapovirus: NOT DETECTED
Shiga-toxin-producing E coli: NOT DETECTED
Shigella/Enteroinvasive E coli: NOT DETECTED
Vibrio cholerae: NOT DETECTED
Vibrio: NOT DETECTED
Yersinia enterocolitica: DETECTED — AB

## 2020-08-01 ENCOUNTER — Other Ambulatory Visit: Payer: Self-pay

## 2020-08-01 MED ORDER — CIPROFLOXACIN HCL 500 MG PO TABS
500.0000 mg | ORAL_TABLET | Freq: Two times a day (BID) | ORAL | 0 refills | Status: DC
Start: 2020-08-01 — End: 2020-09-12

## 2020-08-03 ENCOUNTER — Other Ambulatory Visit: Payer: Self-pay | Admitting: Internal Medicine

## 2020-08-03 ENCOUNTER — Telehealth: Payer: Self-pay | Admitting: Internal Medicine

## 2020-08-03 NOTE — Telephone Encounter (Signed)
[facility] calling about GI pathogen panel results. Dr. Henrene Pastor got those results on 08/01/20 and the pt has already been placed on medication.

## 2020-08-09 ENCOUNTER — Encounter: Payer: Self-pay | Admitting: Internal Medicine

## 2020-08-09 ENCOUNTER — Ambulatory Visit (INDEPENDENT_AMBULATORY_CARE_PROVIDER_SITE_OTHER): Payer: Commercial Managed Care - PPO | Admitting: Internal Medicine

## 2020-08-09 VITALS — BP 110/80 | HR 72 | Ht 75.0 in | Wt 199.0 lb

## 2020-08-09 DIAGNOSIS — R194 Change in bowel habit: Secondary | ICD-10-CM

## 2020-08-09 DIAGNOSIS — K588 Other irritable bowel syndrome: Secondary | ICD-10-CM | POA: Diagnosis not present

## 2020-08-09 DIAGNOSIS — R197 Diarrhea, unspecified: Secondary | ICD-10-CM | POA: Diagnosis not present

## 2020-08-09 NOTE — Patient Instructions (Signed)
Please follow up as needed 

## 2020-08-09 NOTE — Progress Notes (Signed)
HISTORY OF PRESENT ILLNESS:  Steve [name] is a 69 y.o. male with GERD complicated by peptic stricture for which he underwent esophageal dilation in 2013 and is maintained on pantoprazole 40 mg daily.  He also has a history of adenomatous colon polyps and last underwent surveillance colonoscopy September 2018.  Patient presents today for a new problem with change in bowel habits as manifested by diarrhea.  Spoke with patient via telephone July 21, 2020.  He reported a 4 to 52-month history of change in bowel habits as manifested by loose and at times explosive stools, particularly in the morning.  Symptoms were exacerbated by meals.  No alarm features such as weight loss, abdominal pain, bleeding, or nocturnal features.  He denied any prior antibiotic exposure.  We obtained laboratories July 27, 2020.  Comprehensive metabolic panel, CBC, CRP, TSH, and tissue transglutaminase antibody IgA were all unremarkable or normal.  He was empirically placed on metronidazole for 10 days.  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  He was prescribed ciprofloxacin 500 mg twice daily for 1 week.  He is on his last day of therapy currently.  He has noticed less diarrhea and less problems with explosive loose stools.  He continues to have multiple loose stools, mostly in the morning.  He occasionally have loose stools after lunch.  Typically no problems in the evening.  No new features to report.  His last complete colonoscopy was performed, as previously mentioned, September 2018.  1 diminutive polyp and left-sided diverticulosis as well as incidental internal hemorrhoids noted.  He has completed his Covid vaccination series and booster shot.  REVIEW OF SYSTEMS:  All non-GI ROS negative unless otherwise stated in the HPI except for arthritis  Past Medical History:  Diagnosis Date  . Arthritis    Right shoulder  . Benign hypertrophy of  prostate    Dr.Dahlstedt  . Diverticulosis 2013   Moderate   . First degree AV block 12/09/2018   noted on EKG  . GERD (gastroesophageal reflux disease)   . Grade I diastolic dysfunction 44/10/270   Noted on ECHO   . Hemifacial spasm   . History of loop recorder   . Hx of colonic polyp 2008 & 2013   X2  . Hyperlipidemia   . Internal hemorrhoids 2013  . PFO (patent foramen ovale)    History of   . Prostate cancer ([place])   . 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  . Sleep apnea    Resolved  . Squamous cell skin cancer    Dr.King [place]  . Stricture esophagus 2013    dilation X 1  . Stroke [facility]) 2016   left semiovale stroke   . TIA (transient ischemic attack) 01/2015  . Ulcerative esophagitis 2013    Past Surgical History:  Procedure Laterality Date  . COLONOSCOPY W/ POLYPECTOMY  2013   tubular adenoma  . COLONOSCOPY W/ POLYPECTOMY  2008   hyperplastic  . EP IMPLANTABLE DEVICE N/A 01/03/2016   Procedure: Loop Recorder Insertion;  Surgeon: Sanda Klein, MD;  Location: [place] CV LAB;  Service: Cardiovascular;  Laterality: N/A;  . MOHS SURGERY     x 3  . NASAL SINUS SURGERY    . PATENT FORAMEN OVALE(PFO) CLOSURE N/A 03/27/2018   Procedure: PATENT FORAMEN OVALE (PFO) CLOSURE;  Surgeon: Sherren Mocha, MD;  Location: [place] CV LAB;  Service: Cardiovascular;  Laterality: N/A;  . REVERSE SHOULDER ARTHROPLASTY Right 03/10/2019  Procedure: REVERSE SHOULDER ARTHROPLASTY;  Surgeon: Marchia Bond, MD;  [facility];  Service: Orthopedics;  Laterality: Right;  . ROTATOR CUFF REPAIR Left   . TEE WITHOUT CARDIOVERSION N/A 01/06/2018   Procedure: TRANSESOPHAGEAL ECHOCARDIOGRAM (TEE);  Surgeon: Sanda Klein, MD;  Location: [place];  Service: Cardiovascular;  Laterality: N/A;  . torn biceps tendon     surgical repair  . uvulectomy & tonsillectomy  2008   Dr Wilburn Cornelia    Social History [name]  reports that he quit smoking about 38 years ago.  His smoking use included cigarettes. He has a 5.00 pack-year smoking history. He has never used smokeless tobacco. He reports current alcohol use of about 5.0 - 6.0 standard drinks of alcohol per week. He reports that he does not use drugs.  family history includes Breast cancer in his mother; Diabetes in his daughter; Healthy in his brother; Heart attack in his maternal grandfather; Hyperlipidemia in his son; Prostate cancer in his father; Stroke in his father and paternal uncle; Uterine cancer in his mother.  Allergies  Allergen Reactions  . Pollen Extract        PHYSICAL EXAMINATION: Vital signs: BP 110/80   Pulse 72   Ht 6\' 3"  (1.905 m)   Wt 199 lb (90.3 kg)   SpO2 95%   BMI 24.87 kg/m   Constitutional: generally well-appearing, no acute distress Psychiatric: alert and oriented x3, cooperative Eyes: Anicteric Mouth: Mask Cardiovascular: Heart rate regular Abdomen: Not examined Skin: Normal hue Neuro: No gross deficits  ASSESSMENT:  1.  Postinfectious irritable bowel syndrome.  Still with significant residual symptoms, though improving.  No alarm features. 2.  History of adenomatous colon polyps.  Surveillance up-to-date.  Last colonoscopy 2018 3.  History of GERD complicated by peptic stricture.  Asymptomatic post dilation on PPI   PLAN:  1.  Discussion of the entity postinfectious irritable bowel syndrome 2.  Recommended bulk fiber such as Citrucel or Metamucil daily to nonspecifically improve stool consistency 3.  Tincture of time.  Generally symptoms slowly improve over time.  We are hopeful for the same. 4.  Contact me should symptoms worsen or new problems develop.  He agrees 5.  Surveillance colonoscopy around 2023.  We discussed the role of interval colonoscopy should concerns over entities such as microscopic colitis arise. 6.  Reflux precautions 7.  Continue PPI 8.  Ongoing general medical care with Dr. Quay Burow

## 2020-08-10 ENCOUNTER — Telehealth: Payer: Self-pay | Admitting: *Deleted

## 2020-08-10 NOTE — Progress Notes (Signed)
  Radiation Oncology         [phone]) [phone] ________________________________  Name: Steve [name] MRN: [id number]  Date: 08/11/2020  DOB: 1951/07/06  SIMULATION AND TREATMENT PLANNING NOTE PUBIC ARCH STUDY  EX:HBZJI, Claudina Lick, MD  Franchot Gallo, MD  DIAGNOSIS:  69 y.o. gentleman with Stage T1c adenocarcinoma of the prostate with Gleason score of 3+4, and PSA of 5.32.  Oncology History  Malignant neoplasm of prostate ([place])  03/18/2020 Cancer Staging   Staging form: Prostate, AJCC 8th Edition - Clinical stage from 03/18/2020: Stage IIB (cT1c, cN0, cM0, PSA: 5.3, Grade Group: 2) - Signed by Freeman Caldron, PA-C on 05/03/2020   05/03/2020 Initial Diagnosis   Malignant neoplasm of prostate ([place])       ICD-10-CM   1. Malignant neoplasm of prostate ([place])  C61     COMPLEX SIMULATION:  The patient presented today for evaluation for possible prostate seed implant. He was brought to the radiation planning suite and placed supine on the CT couch. A 3-dimensional image study set was obtained in upload to the planning computer. There, on each axial slice, I contoured the prostate gland. Then, using three-dimensional radiation planning tools I reconstructed the prostate in view of the structures from the transperineal needle pathway to assess for possible pubic arch interference. In doing so, I did not appreciate any pubic arch interference. Also, the patient's prostate volume was estimated based on the drawn structure. The volume was 31 cc.  Given the pubic arch appearance and prostate volume, patient remains a good candidate to proceed with prostate seed implant. Today, he freely provided informed written consent to proceed.    PLAN: The patient will undergo prostate seed implant.   ________________________________  Sheral Apley. Tammi Klippel, M.D.

## 2020-08-10 NOTE — Telephone Encounter (Signed)
Called patient to remind of pre-seed appts. for 08-11-20, lvm for a return call

## 2020-08-11 ENCOUNTER — Encounter (HOSPITAL_COMMUNITY)
Admission: RE | Admit: 2020-08-11 | Discharge: 2020-08-11 | Disposition: A | Discharge status: Home / Self Care | Payer: Commercial Managed Care - PPO | Source: Ambulatory Visit | Attending: Urology | Admitting: Urology

## 2020-08-11 ENCOUNTER — Encounter: Payer: Self-pay | Admitting: Medical Oncology

## 2020-08-11 ENCOUNTER — Ambulatory Visit
Admission: RE | Admit: 2020-08-11 | Discharge: 2020-08-11 | Disposition: A | Discharge status: Home / Self Care | Payer: Commercial Managed Care - PPO | Source: Ambulatory Visit | Attending: Radiation Oncology | Admitting: Radiation Oncology

## 2020-08-11 ENCOUNTER — Encounter: Payer: Self-pay | Admitting: Urology

## 2020-08-11 ENCOUNTER — Ambulatory Visit (HOSPITAL_COMMUNITY)
Admission: RE | Admit: 2020-08-11 | Discharge: 2020-08-11 | Disposition: A | Discharge status: Home / Self Care | Payer: Commercial Managed Care - PPO | Source: Ambulatory Visit | Attending: Urology | Admitting: Urology

## 2020-08-11 ENCOUNTER — Other Ambulatory Visit: Payer: Self-pay

## 2020-08-11 ENCOUNTER — Ambulatory Visit
Admission: RE | Admit: 2020-08-11 | Discharge: 2020-08-11 | Disposition: A | Discharge status: Home / Self Care | Payer: Commercial Managed Care - PPO | Source: Ambulatory Visit | Attending: Urology | Admitting: Urology

## 2020-08-11 ENCOUNTER — Ambulatory Visit (HOSPITAL_COMMUNITY): Payer: Commercial Managed Care - PPO

## 2020-08-11 VITALS — Wt 198.4 lb

## 2020-08-11 DIAGNOSIS — C61 Malignant neoplasm of prostate: Secondary | ICD-10-CM

## 2020-08-11 DIAGNOSIS — Z01818 Encounter for other preprocedural examination: Secondary | ICD-10-CM | POA: Diagnosis not present

## 2020-08-18 ENCOUNTER — Ambulatory Visit: Payer: Commercial Managed Care - PPO | Admitting: Radiation Oncology

## 2020-08-18 ENCOUNTER — Ambulatory Visit: Payer: Self-pay | Admitting: Urology

## 2020-08-26 ENCOUNTER — Ambulatory Visit: Payer: 59 | Admitting: Neurology

## 2020-08-30 ENCOUNTER — Other Ambulatory Visit: Payer: Self-pay

## 2020-08-30 ENCOUNTER — Other Ambulatory Visit: Payer: Self-pay | Admitting: Internal Medicine

## 2020-08-30 MED ORDER — PANTOPRAZOLE SODIUM 40 MG PO TBEC: DELAYED_RELEASE_TABLET | ORAL | 0 refills | Status: DC

## 2020-09-09 ENCOUNTER — Encounter (HOSPITAL_BASED_OUTPATIENT_CLINIC_OR_DEPARTMENT_OTHER): Payer: Self-pay | Admitting: Urology

## 2020-09-12 ENCOUNTER — Encounter (HOSPITAL_BASED_OUTPATIENT_CLINIC_OR_DEPARTMENT_OTHER): Payer: Self-pay | Admitting: Urology

## 2020-09-12 ENCOUNTER — Other Ambulatory Visit: Payer: Self-pay

## 2020-09-12 NOTE — Progress Notes (Addendum)
Spoke w/ via phone for pre-op interview---pt Lab needs dos---- none              Lab results------ekg 08-11-2020 epic, chest xray  08-11-2020 epic, has lab appt 09-16-1099 am for cbc, cmet, pt, ptt COVID test ------09-16-2020 at 1445 Arrive at -------930 am 09-19-2020  NPO after MN NO Solid Food.  Clear liquids from MN until---830 am then npo Medications to take morning of surgery -----pantaprazole Diabetic medication -----n/a Patient Special Instructions -----fleets enema am of surgery Pre-Op special Istructions -----none Patient verbalized understanding of instructions that were given at this phone interview. Patient denies shortness of breath, chest pain, fever, cough at this phone interview.  Pt has inactive loop record no longer used lov neurology 07-01-2020 epic Echo 05-12-2019 epic

## 2020-09-13 ENCOUNTER — Telehealth: Payer: Self-pay | Admitting: *Deleted

## 2020-09-13 NOTE — Telephone Encounter (Signed)
CALLED PATIENT TO REMND OF LAB AND COVID TESTING FOR 09-16-20, SPOKE WITH PATIENT AND HE IS AWARE OF THESE APPTS.

## 2020-09-16 ENCOUNTER — Other Ambulatory Visit (HOSPITAL_COMMUNITY)
Admission: RE | Admit: 2020-09-16 | Discharge: 2020-09-16 | Disposition: A | Discharge status: Home / Self Care | Payer: 59 | Source: Ambulatory Visit | Attending: Urology | Admitting: Urology

## 2020-09-16 ENCOUNTER — Encounter (HOSPITAL_COMMUNITY)
Admission: RE | Admit: 2020-09-16 | Discharge: 2020-09-16 | Disposition: A | Discharge status: Home / Self Care | Payer: 59 | Source: Ambulatory Visit | Attending: Urology | Admitting: Urology

## 2020-09-16 ENCOUNTER — Other Ambulatory Visit: Payer: Self-pay

## 2020-09-16 ENCOUNTER — Telehealth: Payer: Self-pay | Admitting: *Deleted

## 2020-09-16 DIAGNOSIS — Z01812 Encounter for preprocedural laboratory examination: Secondary | ICD-10-CM | POA: Diagnosis not present

## 2020-09-16 DIAGNOSIS — Z20822 Contact with and (suspected) exposure to covid-19: Secondary | ICD-10-CM | POA: Insufficient documentation

## 2020-09-16 LAB — COMPREHENSIVE METABOLIC PANEL
ALT: 28 U/L (ref 0–44)
AST: 24 U/L (ref 15–41)
Albumin: 4.3 g/dL (ref 3.5–5.0)
Alkaline Phosphatase: 72 U/L (ref 38–126)
Anion gap: 7 (ref 5–15)
BUN: 20 mg/dL (ref 8–23)
CO2: 26 mmol/L (ref 22–32)
Calcium: 9.2 mg/dL (ref 8.9–10.3)
Chloride: 104 mmol/L (ref 98–111)
Creatinine, Ser: 1.05 mg/dL (ref 0.61–1.24)
GFR, Estimated: >60 mL/min (ref >60)
Glucose, Bld: 109 mg/dL — ABNORMAL HIGH (ref 70–99)
Potassium: 4.8 mmol/L (ref 3.5–5.1)
Sodium: 137 mmol/L (ref 135–145)
Total Bilirubin: 0.6 mg/dL (ref 0.3–1.2)
Total Protein: 7.3 g/dL (ref 6.5–8.1)

## 2020-09-16 LAB — CBC
HCT: 44.5 % (ref 39.0–52.0)
Hemoglobin: 15 g/dL (ref 13.0–17.0)
MCH: 31.7 pg (ref 26.0–34.0)
MCHC: 33.7 g/dL (ref 30.0–36.0)
MCV: 94.1 fL (ref 80.0–100.0)
Platelets: 172 10*3/uL (ref 150–400)
RBC: 4.73 MIL/uL (ref 4.22–5.81)
RDW: 12.4 % (ref 11.5–15.5)
WBC: 6.3 10*3/uL (ref 4.0–10.5)
nRBC: 0 % (ref 0.0–0.2)

## 2020-09-16 LAB — PROTIME-INR
INR: 1 (ref 0.8–1.2)
Prothrombin Time: 13 seconds (ref 11.4–15.2)

## 2020-09-16 LAB — SARS CORONAVIRUS 2 (TAT 6-24 HRS): SARS Coronavirus 2: NEGATIVE

## 2020-09-16 LAB — APTT: aPTT: 34 seconds (ref 24–36)

## 2020-09-16 NOTE — Telephone Encounter (Signed)
Called patient to remind of procedure for 09-19-20, spoke with patient and he is aware of this procedure

## 2020-09-18 NOTE — H&P (Signed)
H&P  Chief Complaint:  Prostate cancer  History of Present Illness:  69 year old male presents at this time for brachytherapy for curative management of prostate cancer.  History below:  Date of Dx: 6.11.2021  TNM stage: cT1c Nx Mx  PSA: 5.32  Gleason score: 3+4=7 (GG 2)  Biopsy (03/18/20): 5/12 cores positive  Left: L lateral mid (5%, 3+3=6)  Right: R apex (80%, 3+4=7), R lateral apex (30%, 3+4=7), R lateral mid (5%, 3+3=6), R lateral base (< 5%, 3+3=6)  Prostate volume: 30.9 cc  PSAD: 0.17     Past Medical History:  Diagnosis Date  . Arthritis    Right shoulder  . Diverticulosis 2013   Moderate   . First degree AV block 12/09/2018   noted on EKG  . GERD (gastroesophageal reflux disease)   . Grade I diastolic dysfunction 97/35/3299   Noted on ECHO   . Hemifacial spasm   . History of loop recorder   . Hx of colonic polyp 2008 & 2013   X2  . Hyperlipidemia   . Internal hemorrhoids 2013  . PFO (patent foramen ovale)    History of   . Prostate cancer ([place])   . 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  . Sleep apnea    Resolved  . Squamous cell skin cancer    Dr.[place]  . Stricture esophagus 2013    dilation X 1  . Stroke [facility]) 2016   left semiovale stroke   . TIA (transient ischemic attack) 01/2015  . Ulcerative esophagitis 2013    Past Surgical History:  Procedure Laterality Date  . COLONOSCOPY W/ POLYPECTOMY  2013, 2018 last done   tubular adenoma  . COLONOSCOPY W/ POLYPECTOMY  2008   hyperplastic  . EP IMPLANTABLE DEVICE N/A 01/03/2016   Procedure: Loop Recorder Insertion;  Surgeon: Sanda Klein, MD;  Location: [facility];  Service: Cardiovascular;  Laterality: N/A;  . MOHS SURGERY     x 3  . NASAL SINUS SURGERY  15 yrs ago  . PATENT FORAMEN OVALE(PFO) CLOSURE N/A 03/27/2018   Procedure: PATENT FORAMEN OVALE (PFO) CLOSURE;  Surgeon: Sherren Mocha, MD;  Location: [facility];  Service: Cardiovascular;  Laterality: N/A;  .  PROSTATE BIOPSY  2021  . REVERSE SHOULDER ARTHROPLASTY Right 03/10/2019   Procedure: REVERSE SHOULDER ARTHROPLASTY;  Surgeon: Marchia Bond, MD;  Location: [facility];  Service: Orthopedics;  Laterality: Right;  . ROTATOR CUFF REPAIR Left yrs ago  . TEE WITHOUT CARDIOVERSION N/A 01/06/2018   Procedure: TRANSESOPHAGEAL ECHOCARDIOGRAM (TEE);  Surgeon: Sanda Klein, MD;  Location: [facility];  Service: Cardiovascular;  Laterality: N/A;  . torn biceps tendon Left yrs ago   surgical repair  . uvulectomy & tonsillectomy  2008   Dr Wilburn Cornelia    Home Medications:  Allergies as of 09/18/2020      Reactions   Pollen Extract    Eyes itch and water      Medication List    Notice   Cannot display discharge medications because the patient has not yet been admitted.     Allergies:  Allergies  Allergen Reactions  . Pollen Extract     Eyes itch and water    Family History  Problem Relation Age of Onset  . Uterine cancer Mother   . Breast cancer Mother        Deceased, 14  . Stroke Father        > 15  . Prostate cancer Father  Deceased, 22  . Diabetes Daughter        type 1  . Stroke Paternal Uncle        > 55  . Heart attack Maternal Grandfather         >55/mid-60s  . Hyperlipidemia Son   . Healthy Brother   . Colon cancer Neg Hx   . Rectal cancer Neg Hx   . Stomach cancer Neg Hx   . Esophageal cancer Neg Hx     Social History:  reports that he quit smoking about 38 years ago. His smoking use included cigarettes. He has a 5.00 pack-year smoking history. He has never used smokeless tobacco. He reports current alcohol use of about 5.0 - 6.0 standard drinks of alcohol per week. He reports that he does not use drugs.  ROS: A complete review of systems was performed.  All systems are negative except for pertinent findings as noted.  Physical Exam:  Vital signs in last 24 hours: Ht 6\' 3"  (1.905 m)   Wt 88.5 kg   BMI 24.37 kg/m  Constitutional:  Alert and oriented, No  acute distress Cardiovascular: Regular rate  Respiratory: Normal respiratory effort GI: Abdomen is soft, nontender, nondistended, no abdominal masses. No CVAT.  Genitourinary: Normal male phallus, testes are descended bilaterally and non-tender and without masses, scrotum is normal in appearance without lesions or masses, perineum is normal on inspection. Lymphatic: No lymphadenopathy Neurologic: Grossly intact, no focal deficits Psychiatric: Normal mood and affect  Laboratory Data:  Recent Labs    09/16/20 1122  WBC 6.3  HGB 15.0  HCT 44.5  PLT 172    Recent Labs    09/16/20 1122  NA 137  K 4.8  CL 104  GLUCOSE 109*  BUN 20  CALCIUM 9.2  CREATININE 1.05     No results found for this or any previous visit (from the past 24 hour(s)). Recent Results (from the past 240 hour(s))  SARS CORONAVIRUS 2 (TAT 6-24 HRS) Nasopharyngeal Nasopharyngeal Swab     Status: None   Collection Time: 09/16/20  2:30 PM   Specimen: Nasopharyngeal Swab  Result Value Ref Range Status   SARS Coronavirus 2 NEGATIVE NEGATIVE Final    Comment: (NOTE) SARS-CoV-2 target nucleic acids are NOT DETECTED.  The SARS-CoV-2 RNA is generally detectable in upper and lower respiratory specimens during the acute phase of infection. Negative results do not preclude SARS-CoV-2 infection, do not rule out co-infections with other pathogens, and should not be used as the sole basis for treatment or other patient management decisions. Negative results must be combined with clinical observations, patient history, and epidemiological information. The expected result is Negative.  Fact Sheet for Patients: SugarRoll.be  Fact Sheet for Healthcare Providers: https://www.woods-mathews.com/  This test is not yet approved or cleared by the [place] FDA and  has been authorized for detection and/or diagnosis of SARS-CoV-2 by FDA under an Emergency Use Authorization (EUA).  This EUA will remain  in effect (meaning this test can be used) for the duration of the COVID-19 declaration under Se ction 564(b)(1) of the Act, 21 U.S.C. section 360bbb-3(b)(1), unless the authorization is terminated or revoked sooner.  Performed at [facility], [place] [address], [place], [place] [address]     Renal Function: Recent Labs    09/16/20 1122  CREATININE 1.05   Estimated Creatinine Clearance: 78.2 mL/min (by C-G formula based on SCr of 1.05 mg/dL).  Radiologic Imaging: No results found.  Impression/Assessment:  Favorable intermediate  risk prostate cancer  Plan:   I 125 brachytherapy, placement of Space OAR

## 2020-09-19 ENCOUNTER — Other Ambulatory Visit: Payer: Self-pay

## 2020-09-19 ENCOUNTER — Encounter (HOSPITAL_BASED_OUTPATIENT_CLINIC_OR_DEPARTMENT_OTHER): Payer: Self-pay | Admitting: Urology

## 2020-09-19 ENCOUNTER — Ambulatory Visit (HOSPITAL_BASED_OUTPATIENT_CLINIC_OR_DEPARTMENT_OTHER)
Admission: RE | Admit: 2020-09-19 | Discharge: 2020-09-19 | Disposition: A | Discharge status: Home / Self Care | Payer: 59 | Attending: Urology | Admitting: Urology

## 2020-09-19 ENCOUNTER — Encounter (HOSPITAL_BASED_OUTPATIENT_CLINIC_OR_DEPARTMENT_OTHER)
Admission: RE | Disposition: A | Discharge status: Home / Self Care | Payer: Self-pay | Source: Home / Self Care | Attending: Urology

## 2020-09-19 ENCOUNTER — Ambulatory Visit (HOSPITAL_BASED_OUTPATIENT_CLINIC_OR_DEPARTMENT_OTHER): Payer: 59 | Admitting: Anesthesiology

## 2020-09-19 ENCOUNTER — Ambulatory Visit (HOSPITAL_COMMUNITY): Payer: 59

## 2020-09-19 DIAGNOSIS — Z8349 Family history of other endocrine, nutritional and metabolic diseases: Secondary | ICD-10-CM | POA: Diagnosis not present

## 2020-09-19 DIAGNOSIS — I440 Atrioventricular block, first degree: Secondary | ICD-10-CM | POA: Diagnosis not present

## 2020-09-19 DIAGNOSIS — Z87891 Personal history of nicotine dependence: Secondary | ICD-10-CM | POA: Insufficient documentation

## 2020-09-19 DIAGNOSIS — Z8042 Family history of malignant neoplasm of prostate: Secondary | ICD-10-CM | POA: Diagnosis not present

## 2020-09-19 DIAGNOSIS — Z8049 Family history of malignant neoplasm of other genital organs: Secondary | ICD-10-CM | POA: Diagnosis not present

## 2020-09-19 DIAGNOSIS — Z823 Family history of stroke: Secondary | ICD-10-CM | POA: Insufficient documentation

## 2020-09-19 DIAGNOSIS — J301 Allergic rhinitis due to pollen: Secondary | ICD-10-CM | POA: Diagnosis not present

## 2020-09-19 DIAGNOSIS — Z8673 Personal history of transient ischemic attack (TIA), and cerebral infarction without residual deficits: Secondary | ICD-10-CM | POA: Diagnosis not present

## 2020-09-19 DIAGNOSIS — C61 Malignant neoplasm of prostate: Secondary | ICD-10-CM | POA: Diagnosis not present

## 2020-09-19 DIAGNOSIS — Z803 Family history of malignant neoplasm of breast: Secondary | ICD-10-CM | POA: Insufficient documentation

## 2020-09-19 DIAGNOSIS — Z833 Family history of diabetes mellitus: Secondary | ICD-10-CM | POA: Insufficient documentation

## 2020-09-19 DIAGNOSIS — Z8249 Family history of ischemic heart disease and other diseases of the circulatory system: Secondary | ICD-10-CM | POA: Diagnosis not present

## 2020-09-19 HISTORY — PX: RADIOACTIVE SEED IMPLANT: SHX5150

## 2020-09-19 HISTORY — PX: SPACE OAR INSTILLATION: SHX6769

## 2020-09-19 HISTORY — PX: CYSTOSCOPY: SHX5120

## 2020-09-19 SURGERY — INSERTION, RADIATION SOURCE, PROSTATE
Anesthesia: General | Site: Prostate

## 2020-09-19 MED ORDER — ONDANSETRON HCL 4 MG/2ML IJ SOLN
INTRAMUSCULAR | Status: DC | PRN
  Administered 2020-09-19: 4 mg via INTRAVENOUS

## 2020-09-19 MED ORDER — PROPOFOL 10 MG/ML IV BOLUS
INTRAVENOUS | Status: AC
  Filled 2020-09-19: qty 20

## 2020-09-19 MED ORDER — CEFAZOLIN SODIUM-DEXTROSE 2-4 GM/100ML-% IV SOLN
2.0000 g | Freq: Once | INTRAVENOUS | Status: AC
  Administered 2020-09-19: 2 g via INTRAVENOUS

## 2020-09-19 MED ORDER — PROPOFOL 10 MG/ML IV BOLUS
INTRAVENOUS | Status: AC
  Filled 2020-09-19: qty 40

## 2020-09-19 MED ORDER — LACTATED RINGERS IV SOLN: INTRAVENOUS | Status: DC

## 2020-09-19 MED ORDER — PROPOFOL 10 MG/ML IV BOLUS
INTRAVENOUS | Status: DC | PRN
  Administered 2020-09-19: 160 mg via INTRAVENOUS

## 2020-09-19 MED ORDER — LIDOCAINE 2% (20 MG/ML) 5 ML SYRINGE
INTRAMUSCULAR | Status: DC | PRN
  Administered 2020-09-19: 80 mg via INTRAVENOUS

## 2020-09-19 MED ORDER — FENTANYL CITRATE (PF) 100 MCG/2ML IJ SOLN: 25.0000 ug | INTRAMUSCULAR | Status: DC | PRN

## 2020-09-19 MED ORDER — OXYCODONE HCL 5 MG/5ML PO SOLN: 5.0000 mg | Freq: Once | ORAL | Status: DC | PRN

## 2020-09-19 MED ORDER — ONDANSETRON HCL 4 MG/2ML IJ SOLN
INTRAMUSCULAR | Status: AC
  Filled 2020-09-19: qty 2

## 2020-09-19 MED ORDER — SODIUM CHLORIDE (PF) 0.9 % IJ SOLN
INTRAMUSCULAR | Status: DC | PRN
  Administered 2020-09-19: 10 mL

## 2020-09-19 MED ORDER — DEXAMETHASONE SODIUM PHOSPHATE 4 MG/ML IJ SOLN
INTRAMUSCULAR | Status: DC | PRN
  Administered 2020-09-19: 5 mg via INTRAVENOUS

## 2020-09-19 MED ORDER — FENTANYL CITRATE (PF) 100 MCG/2ML IJ SOLN
INTRAMUSCULAR | Status: DC | PRN
  Administered 2020-09-19 (×6): 25 ug via INTRAVENOUS
  Administered 2020-09-19: 50 ug via INTRAVENOUS

## 2020-09-19 MED ORDER — LIDOCAINE HCL (PF) 2 % IJ SOLN
INTRAMUSCULAR | Status: AC
  Filled 2020-09-19: qty 5

## 2020-09-19 MED ORDER — EPHEDRINE 5 MG/ML INJ
INTRAVENOUS | Status: AC
  Filled 2020-09-19: qty 10

## 2020-09-19 MED ORDER — FENTANYL CITRATE (PF) 100 MCG/2ML IJ SOLN
INTRAMUSCULAR | Status: AC
  Filled 2020-09-19: qty 2

## 2020-09-19 MED ORDER — KETOROLAC TROMETHAMINE 30 MG/ML IJ SOLN
INTRAMUSCULAR | Status: DC | PRN
Start: 2020-09-19 — End: 2020-09-19
  Administered 2020-09-19: 30 mg via INTRAVENOUS

## 2020-09-19 MED ORDER — CEFAZOLIN SODIUM-DEXTROSE 2-4 GM/100ML-% IV SOLN
INTRAVENOUS | Status: AC
  Filled 2020-09-19: qty 100

## 2020-09-19 MED ORDER — FLEET ENEMA 7-19 GM/118ML RE ENEM: 1.0000 | ENEMA | Freq: Once | RECTAL | Status: DC

## 2020-09-19 MED ORDER — KETOROLAC TROMETHAMINE 30 MG/ML IJ SOLN
INTRAMUSCULAR | Status: AC
  Filled 2020-09-19: qty 1

## 2020-09-19 MED ORDER — EPHEDRINE SULFATE 50 MG/ML IJ SOLN
INTRAMUSCULAR | Status: DC | PRN
  Administered 2020-09-19 (×2): 10 mg via INTRAVENOUS

## 2020-09-19 MED ORDER — OXYCODONE HCL 5 MG PO TABS: 5.0000 mg | ORAL_TABLET | Freq: Once | ORAL | Status: DC | PRN

## 2020-09-19 MED ORDER — DEXAMETHASONE SODIUM PHOSPHATE 10 MG/ML IJ SOLN
INTRAMUSCULAR | Status: AC
  Filled 2020-09-19: qty 1

## 2020-09-19 MED ORDER — SODIUM CHLORIDE 0.9 % IV SOLN
INTRAVENOUS | Status: AC | PRN
  Administered 2020-09-19: 1000 mL

## 2020-09-19 MED ORDER — ONDANSETRON HCL 4 MG/2ML IJ SOLN: 4.0000 mg | Freq: Once | INTRAMUSCULAR | Status: DC | PRN

## 2020-09-19 MED ORDER — FENTANYL CITRATE (PF) 250 MCG/5ML IJ SOLN
INTRAMUSCULAR | Status: AC
  Filled 2020-09-19: qty 5

## 2020-09-19 MED ORDER — STERILE WATER FOR IRRIGATION IR SOLN
Status: DC | PRN
  Administered 2020-09-19: 500 mL

## 2020-09-19 SURGICAL SUPPLY — 36 items
BAG DRN RND TRDRP ANRFLXCHMBR (UROLOGICAL SUPPLIES) ×3
BAG URINE DRAIN 2000ML AR STRL (UROLOGICAL SUPPLIES) ×4 IMPLANT
BLADE CLIPPER SENSICLIP SURGIC (BLADE) ×4 IMPLANT
CATH FOLEY 2WAY SLVR  5CC 16FR (CATHETERS) ×4
CATH FOLEY 2WAY SLVR 5CC 16FR (CATHETERS) ×3 IMPLANT
CATH ROBINSON RED A/P 16FR (CATHETERS) IMPLANT
CATH ROBINSON RED A/P 20FR (CATHETERS) ×4 IMPLANT
CLOTH BEACON ORANGE TIMEOUT ST (SAFETY) ×4 IMPLANT
CNTNR URN SCR LID CUP LEK RST (MISCELLANEOUS) ×6 IMPLANT
CONT SPEC 4OZ STRL OR WHT (MISCELLANEOUS) ×8
COVER BACK TABLE 60X90IN (DRAPES) ×4 IMPLANT
COVER MAYO STAND STRL (DRAPES) ×4 IMPLANT
DRAPE C-ARM 35X43 STRL (DRAPES) ×4 IMPLANT
DRSG TEGADERM 4X4.75 (GAUZE/BANDAGES/DRESSINGS) ×7 IMPLANT
DRSG TEGADERM 8X12 (GAUZE/BANDAGES/DRESSINGS) ×8 IMPLANT
GAUZE SPONGE 4X4 12PLY STRL LF (GAUZE/BANDAGES/DRESSINGS) ×1 IMPLANT
GLOVE BIO SURGEON STRL SZ 6.5 (GLOVE) ×4 IMPLANT
GLOVE BIO SURGEON STRL SZ7.5 (GLOVE) ×1 IMPLANT
GLOVE BIO SURGEON STRL SZ8 (GLOVE) ×8 IMPLANT
GLOVE SURG ORTHO 8.5 STRL (GLOVE) ×4 IMPLANT
GLOVE SURG SS PI 6.5 STRL IVOR (GLOVE) IMPLANT
GOWN STRL REUS W/TWL XL LVL3 (GOWN DISPOSABLE) ×4 IMPLANT
HOLDER FOLEY CATH W/STRAP (MISCELLANEOUS) ×3 IMPLANT
I-Seed AgX100 ×1 IMPLANT
IMPL SPACEOAR VUE SYSTEM (Spacer) IMPLANT
IMPLANT SPACEOAR VUE SYSTEM (Spacer) ×4 IMPLANT
IV NS 1000ML (IV SOLUTION) ×4
IV NS 1000ML BAXH (IV SOLUTION) ×3 IMPLANT
KIT TURNOVER CYSTO (KITS) ×4 IMPLANT
MARKER SKIN DUAL TIP RULER LAB (MISCELLANEOUS) ×4 IMPLANT
PACK CYSTO (CUSTOM PROCEDURE TRAY) ×4 IMPLANT
SUT BONE WAX W31G (SUTURE) IMPLANT
SYR 10ML LL (SYRINGE) ×5 IMPLANT
TOWEL OR 17X26 10 PK STRL BLUE (TOWEL DISPOSABLE) ×4 IMPLANT
UNDERPAD 30X36 HEAVY ABSORB (UNDERPADS AND DIAPERS) ×8 IMPLANT
WATER STERILE IRR 500ML POUR (IV SOLUTION) ×4 IMPLANT

## 2020-09-19 NOTE — Discharge Instructions (Signed)
Radioactive Seed Implant Home Care Instructions   Activity:    Rest for the remainder of the day.  Do not drive or operate equipment today.  You may resume normal  activities in a few days as instructed by your physician, without risk of harmful radiation exposure to those around you, provided you follow the time and distance precautions on the Radiation Oncology Instruction Sheet.   Meals: Drink plenty of lipuids and eat light foods, such as gelatin or soup this evening .  You may return to normal meal plan tomorrow.  Return To Work: You may return to work as instructed by Naval architect.  Special Instruction:   If any seeds are found, use tweezers to pick up seeds and place in a glass container of any kind and bring to your physician's office.  Call your physician if any of these symptoms occur:   Persistent or heavy bleeding  Urine stream diminishes or stops completely after catheter is removed  Fever equal to or greater than 101 degrees F  Cloudy urine with a strong foul odor  Severe pain  You may feel some burning pain and/or hesitancy when you urinate after the catheter is removed.  These symptoms may increase over the next few weeks, but should diminish within forur to six weeks.  Applying moist heat to the lower abdomen or a hot tub bath may help relieve the pain.  If the discomfort becomes severe, please call your physician for additional medications.    Post Anesthesia Home Care Instructions  Activity: Get plenty of rest for the remainder of the day. A responsible individual must stay with you for 24 hours following the procedure.  For the next 24 hours, DO NOT: -Drive a car -Paediatric nurse -Drink alcoholic beverages -Take any medication unless instructed by your physician -Make any legal decisions or sign important papers.  Meals: Start with liquid foods such as gelatin or soup. Progress to regular foods as tolerated. Avoid greasy, spicy, heavy foods. If  nausea and/or vomiting occur, drink only clear liquids until the nausea and/or vomiting subsides. Call your physician if vomiting continues.  Special Instructions/Symptoms: Your throat may feel dry or sore from the anesthesia or the breathing tube placed in your throat during surgery. If this causes discomfort, gargle with warm salt water. The discomfort should disappear within 24 hours.  If you had a scopolamine patch placed behind your ear for the management of post- operative nausea and/or vomiting:  1. The medication in the patch is effective for 72 hours, after which it should be removed.  Wrap patch in a tissue and discard in the trash. Wash hands thoroughly with soap and water. 2. You may remove the patch earlier than 72 hours if you experience unpleasant side effects which may include dry mouth, dizziness or visual disturbances. 3. Avoid touching the patch. Wash your hands with soap and water after contact with the patch.    May take Ibuprofen products after 7 PM as needed for pain.

## 2020-09-19 NOTE — Anesthesia Preprocedure Evaluation (Addendum)
Anesthesia Evaluation  Patient identified by MRN, date of birth, ID band Patient awake    Reviewed: Allergy & Precautions, NPO status , Patient's Chart, lab work & pertinent test results  Airway Mallampati: II       Dental no notable dental hx. (+) Teeth Intact   Pulmonary sleep apnea , former smoker,    Pulmonary exam normal breath sounds clear to auscultation       Cardiovascular Exercise Tolerance: Good Normal cardiovascular exam+ dysrhythmias (1st degre AV block)  Rhythm:Regular Rate:Normal   1. The left ventricle has normal systolic function, with an ejection  fraction of 55-60%. The cavity size was normal. There is mildly increased  left ventricular wall thickness. Left ventricular diastolic Doppler  parameters are consistent with impaired  relaxation.  2. The right ventricle has normal systolic function. The cavity was  normal. There is no increase in right ventricular wall thickness.  3. Trivial pericardial effusion is present.  4. No stenosis of the aortic valve.  5. The aorta is normal in size and structure.  6. PFO closure device inplace. No obvious shunting by color doppler.    Neuro/Psych PSYCHIATRIC DISORDERS Anxiety TIACVA, No Residual Symptoms    GI/Hepatic Neg liver ROS, PUD, GERD  Medicated,  Endo/Other  negative endocrine ROS  Renal/GU    Prostate ca    Musculoskeletal  (+) Arthritis , Osteoarthritis,    Abdominal Normal abdominal exam  (+)   Peds H/o PFO s/p closure   Hematology negative hematology ROS (+)   Anesthesia Other Findings   Reproductive/Obstetrics                            Anesthesia Physical  Anesthesia Plan  ASA: II  Anesthesia Plan: General   Post-op Pain Management:    Induction: Intravenous  PONV Risk Score and Plan: 2 and Ondansetron and Dexamethasone  Airway Management Planned: LMA  Additional Equipment:   Intra-op Plan:    Post-operative Plan: Extubation in OR  Informed Consent: I have reviewed the patients History and Physical, chart, labs and discussed the procedure including the risks, benefits and alternatives for the proposed anesthesia with the patient or authorized representative who has indicated his/her understanding and acceptance.     Dental advisory given  Plan Discussed with: CRNA, Anesthesiologist and Surgeon  Anesthesia Plan Comments:         Anesthesia Quick Evaluation

## 2020-09-19 NOTE — Op Note (Signed)
Preoperative diagnosis: Clinical stage TI C adenocarcinoma the prostate   Postoperative diagnosis: Same   Procedure: I-125 prostate seed implantation, SpaceOAR placement, flexible cystoscopy  Surgeon: Lillette Boxer. Persia Lintner M.D.  Radiation Oncologist: Tyler Pita, M.D.  Anesthesia: Gen.   Indications: Patient  was diagnosed with clinical stage TI C prostate cancer. We had extensive discussion with him about treatment options versus. He elected to proceed with seed implantation. He underwent consultation my office as well as with Dr. Tammi Klippel. He appeared to understand the advantages disadvantages potential risks of this treatment option. Full informed consent has been obtained.   Technique and findings: Patient was brought the operating room where he had successful induction of general anesthesia. He was placed in dorso-lithotomy position and prepped and draped in usual manner. Appropriate surgical timeout was performed. Radiation oncology department placed a transrectal ultrasound probe anchoring stand. Foley catheter with contrast in the balloon was inserted without difficulty. Anchoring needles were placed within the prostate. Rectal tube was placed. Real-time contouring of the urethra prostate and rectum were performed and the dosing parameters were established. Targeted dose was 145 gray.  I was then called  to the operating suite suite for placement of the needles. A second timeout was performed. All needle passage was done with real-time transrectal ultrasound guidance with the sagittal plane. A total of 21 needles were placed.  73 active seeds were implanted.  I then proceeded with placement of SpaceOAR by introducing a needle with the bevel angled inferiorly approximately 2 cm superior to the anus. This was angled downward and under direct ultrasound was placed within the space between the prostatic capsule and rectum. This was confirmed with a small amount of sterile saline injected and  this was performed under direct ultrasound. I then attached the SpaceOAR to the needle and injected this in the space between the prostate and rectum with good placement noted. The Foley catheter was removed and flexible cystoscopy failed to show any seeds outside the prostate.  The patient was brought to recovery room in stable condition, having tolerated the procedure well.Marland Kitchen

## 2020-09-19 NOTE — Progress Notes (Signed)
  Radiation Oncology         [phone] ________________________________  Name: KEONDRICK DILKS MRN: [id number]  Date: 09/19/2020  DOB: 1951-07-16       Prostate Seed Implant  CH:EKBTC, Claudina Lick, MD  No ref. provider found  DIAGNOSIS:   69 y.o. gentleman with Stage T1c adenocarcinoma of the prostate with Gleason score of 3+4, and PSA of 5.32.  Oncology History  Malignant neoplasm of prostate ([place])  03/18/2020 Cancer Staging   Staging form: Prostate, AJCC 8th Edition - Clinical stage from 03/18/2020: Stage IIB (cT1c, cN0, cM0, PSA: 5.3, Grade Group: 2) - Signed by Freeman Caldron, PA-C on 05/03/2020   05/03/2020 Initial Diagnosis   Malignant neoplasm of prostate ([facility])    PROCEDURE: Insertion of radioactive I-125 seeds into the prostate gland.  RADIATION DOSE: 145 Gy, definitive therapy.  TECHNIQUE: AMOND SPERANZA was brought to the operating room with the urologist. He was placed in the dorsolithotomy position. He was catheterized and a rectal tube was inserted. The perineum was shaved, prepped and draped. The ultrasound probe was then introduced into the rectum to see the prostate gland.  TREATMENT DEVICE: A needle grid was attached to the ultrasound probe stand and anchor needles were placed.  3D PLANNING: The prostate was imaged in 3D using a sagittal sweep of the prostate probe. These images were transferred to the planning computer. There, the prostate, urethra and rectum were defined on each axial reconstructed image. Then, the software created an optimized 3D plan and a few seed positions were adjusted. The quality of the plan was reviewed using [facility] information for the target and the following two organs at risk:  Urethra and Rectum.  Then the accepted plan was printed and handed off to the radiation therapist.  Under my supervision, the custom loading of the seeds and spacers was carried out and loaded into sealed vicryl sleeves.  These pre-loaded needles were then placed into  the needle holder.Marland Kitchen  PROSTATE VOLUME STUDY:  Using transrectal ultrasound the volume of the prostate was verified to be 37.7 cc.  SPECIAL TREATMENT PROCEDURE/SUPERVISION AND HANDLING: The pre-loaded needles were then delivered under sagittal guidance. A total of 21 needles were used to deposit 73 seeds in the prostate gland. The individual seed activity was 0.397 mCi.  SpaceOAR:  Yes  COMPLEX SIMULATION: At the end of the procedure, an anterior radiograph of the pelvis was obtained to document seed positioning and count. Cystoscopy was performed to check the urethra and bladder.  MICRODOSIMETRY: At the end of the procedure, the patient was emitting 0.06 mR/hr at 1 meter. Accordingly, he was considered safe for hospital discharge.  PLAN: The patient will return to the radiation oncology clinic for post implant CT dosimetry in three weeks.   ________________________________  Sheral Apley Tammi Klippel, M.D.

## 2020-09-19 NOTE — Interval H&P Note (Signed)
History and Physical Interval Note:  09/19/2020 11:23 AM  Steve [name]  has presented today for surgery, with the diagnosis of PROSTATE CANCER.  The various methods of treatment have been discussed with the patient and family. After consideration of risks, benefits and other options for treatment, the patient has consented to  Procedure(s) with comments: RADIOACTIVE SEED IMPLANT/BRACHYTHERAPY IMPLANT (N/A) - 90 MINS SPACE OAR INSTILLATION (N/A) as a surgical intervention.  The patient's history has been reviewed, patient examined, no change in status, stable for surgery.  I have reviewed the patient's chart and labs.  Questions were answered to the patient's satisfaction.     Steve [name]

## 2020-09-19 NOTE — Anesthesia Procedure Notes (Signed)
Procedure Name: LMA Insertion Date/Time: 09/19/2020 11:45 AM Performed by: Justice Rocher, CRNA Pre-anesthesia Checklist: Patient identified, Emergency Drugs available, Suction available, Patient being monitored and Timeout performed Patient Re-evaluated:Patient Re-evaluated prior to induction Oxygen Delivery Method: Circle system utilized Preoxygenation: Pre-oxygenation with 100% oxygen Induction Type: IV induction Ventilation: Mask ventilation without difficulty LMA: LMA inserted LMA Size: 5.0 Number of attempts: 1 Airway Equipment and Method: Bite block Placement Confirmation: positive ETCO2,  CO2 detector and breath sounds checked- equal and bilateral Tube secured with: Tape Dental Injury: Teeth and Oropharynx as per pre-operative assessment  Comments: Placed by Zenda Alpers , CRNA

## 2020-09-19 NOTE — Transfer of Care (Signed)
Immediate Anesthesia Transfer of Care Note  Patient: Steve [name]  Procedure(s) Performed: Procedure(s) (LRB): RADIOACTIVE SEED IMPLANT/BRACHYTHERAPY IMPLANT (N/A) SPACE OAR INSTILLATION (N/A) CYSTOSCOPY FLEXIBLE (N/A)  Patient Location: PACU  Anesthesia Type: General  Level of Consciousness: awake, sedated, patient cooperative and responds to stimulation  Airway & Oxygen Therapy: Patient Spontanous Breathing and Patient connected to [place] 02 and soft FM   Post-op Assessment: Report given to PACU RN, Post -op Vital signs reviewed and stable and Patient moving all extremities  Post vital signs: Reviewed and stable  Complications: No apparent anesthesia complications

## 2020-09-20 ENCOUNTER — Encounter (HOSPITAL_BASED_OUTPATIENT_CLINIC_OR_DEPARTMENT_OTHER): Payer: Self-pay | Admitting: Urology

## 2020-09-20 NOTE — Anesthesia Postprocedure Evaluation (Signed)
Anesthesia Post Note  Patient: PIER BOSHER  Procedure(s) Performed: RADIOACTIVE SEED IMPLANT/BRACHYTHERAPY IMPLANT (N/A Prostate) SPACE OAR INSTILLATION (N/A ) CYSTOSCOPY FLEXIBLE (N/A Bladder)     Patient location during evaluation: PACU Anesthesia Type: General Level of consciousness: awake and alert Pain management: pain level controlled Vital Signs Assessment: post-procedure vital signs reviewed and stable Respiratory status: spontaneous breathing, nonlabored ventilation and respiratory function stable Cardiovascular status: blood pressure returned to baseline and stable Postop Assessment: no apparent nausea or vomiting Anesthetic complications: no   No complications documented.  Last Vitals:  Vitals:   09/19/20 1345 09/19/20 1400  BP: (!) 145/87 (!) 150/87  Pulse: 82 78  Resp: 10 (!) 25  Temp:    SpO2: 100% 99%    Last Pain:  Vitals:   09/19/20 1345  PainSc: 0-No pain                 Candra R Laketa Sandoz

## 2020-09-30 ENCOUNTER — Other Ambulatory Visit: Payer: Self-pay | Admitting: Internal Medicine

## 2020-10-17 ENCOUNTER — Telehealth: Payer: Self-pay | Admitting: *Deleted

## 2020-10-17 NOTE — Telephone Encounter (Signed)
RETURNED PATIENT'S PHONE CALL, SPOKE WITH PATIENT. ?

## 2020-10-19 ENCOUNTER — Ambulatory Visit: Payer: Commercial Managed Care - PPO | Admitting: Radiation Oncology

## 2020-10-19 ENCOUNTER — Ambulatory Visit: Payer: 59 | Admitting: Urology

## 2020-10-25 NOTE — Addendum Note (Signed)
Encounter addended by: Tyler Pita, MD on: 10/25/2020 3:18 PM  Actions taken: Medication List reviewed, Problem List reviewed, Allergies reviewed

## 2020-10-26 ENCOUNTER — Telehealth: Payer: Self-pay | Admitting: *Deleted

## 2020-10-26 NOTE — Telephone Encounter (Signed)
CALLED PATIENT TO REMIND OF POST SEED APPTS. FOR 10-27-20, SPOKE WITH PATIENT AND HE IS AWARE OF THESE APPTS.

## 2020-10-27 ENCOUNTER — Other Ambulatory Visit: Payer: Self-pay

## 2020-10-27 ENCOUNTER — Encounter: Payer: Self-pay | Admitting: Urology

## 2020-10-27 ENCOUNTER — Ambulatory Visit
Admission: RE | Admit: 2020-10-27 | Discharge: 2020-10-27 | Disposition: A | Discharge status: Home / Self Care | Payer: Commercial Managed Care - PPO | Source: Ambulatory Visit | Attending: Radiation Oncology | Admitting: Radiation Oncology

## 2020-10-27 ENCOUNTER — Ambulatory Visit
Admission: RE | Admit: 2020-10-27 | Discharge: 2020-10-27 | Disposition: A | Discharge status: Home / Self Care | Payer: 59 | Source: Ambulatory Visit | Attending: Urology | Admitting: Urology

## 2020-10-27 VITALS — BP 127/71 | HR 68 | Temp 97.8°F | Resp 18 | Ht 75.0 in | Wt 196.0 lb

## 2020-10-27 VITALS — BP 127/71 | HR 68 | Temp 97.8°F | Resp 18 | Ht 75.0 in | Wt 196.4 lb

## 2020-10-27 DIAGNOSIS — Z79899 Other long term (current) drug therapy: Secondary | ICD-10-CM | POA: Insufficient documentation

## 2020-10-27 DIAGNOSIS — R350 Frequency of micturition: Secondary | ICD-10-CM | POA: Insufficient documentation

## 2020-10-27 DIAGNOSIS — C61 Malignant neoplasm of prostate: Secondary | ICD-10-CM

## 2020-10-27 DIAGNOSIS — R3911 Hesitancy of micturition: Secondary | ICD-10-CM | POA: Insufficient documentation

## 2020-10-27 DIAGNOSIS — Z7982 Long term (current) use of aspirin: Secondary | ICD-10-CM | POA: Insufficient documentation

## 2020-10-27 NOTE — Progress Notes (Signed)
Radiation Oncology         [phone] ________________________________  Name: Steve [name] MRN: [id number]  Date: 10/27/2020  DOB: 11-May-1951  Post-Seed Follow-Up Visit Note  CC: Steve Rail, MD  Steve Gallo, MD  Diagnosis:   70 y.o. gentleman with Stage T1c adenocarcinoma of the prostate with Gleason score of 3+4, and PSA of 5.32.    ICD-10-CM   1. Malignant neoplasm of prostate ([place])  C61     Interval Since Last Radiation:  5.5 weeks 09/19/20:  Insertion of radioactive I-125 seeds into the prostate gland; 145 Gy, definitive therapy with placement of SpaceOAR VUE gel.  Narrative:  The patient returns today for routine follow-up.  He is complaining of increased urinary frequency and urinary hesitation symptoms. He filled out a questionnaire regarding urinary function today providing and overall IPSS score of 23 characterizing his symptoms as severe.  His pre-implant score was 7. He denies any abdominal pain or bowel symptoms aside from increased flatulence.  He had a postprocedure visit with Steve Crocker, NP on 10/13/2020 and was started on Flomax at that time to help manage his LUTS.  He has noticed some improvement since starting the Flomax and reports that his LUTS are manageable at this point.  He specifically denies dysuria, gross hematuria, straining to void, incomplete bladder emptying or incontinence.  His nocturia has improved from 7-8 times per night down to 3-4 times at this point.  He has also noticed some gradual improvement in his energy level and has been able to get back to working out at the gym which she is pleased with.  Overall, he is pleased with his progress to date.  ALLERGIES:  is allergic to pollen extract.  Meds: Current Outpatient Medications  Medication Sig Dispense Refill  . aspirin 81 MG tablet Take 81 mg by mouth daily.    . botulinum toxin Type A (BOTOX) 100 units SOLR injection Botox 100 unit injection  Take 100 units every 3 months by  injection route.    . cholecalciferol (VITAMIN D3) 25 MCG (1000 UNIT) tablet Take 1,000 Units by mouth daily.    . fluticasone (FLONASE) 50 MCG/ACT nasal spray ONE SPRAY IN EACH NOSTRIL TWICE DAILY ASNEEDED -USE CROSSOVER TECHNIQUE DISCUSSED 16 g 0  . Multiple Vitamins-Minerals (PRESERVISION AREDS 2) CAPS Take 1 capsule by mouth 2 (two) times a day.    . pantoprazole (PROTONIX) 40 MG tablet TAKE ONE TABLET DAILY 30 tablet 0  . simvastatin (ZOCOR) 40 MG tablet TAKE ONE TABLET AT BEDTIME 90 tablet 3  . vardenafil (LEVITRA) 20 MG tablet Take 20 mg by mouth daily as needed for erectile dysfunction.    . vitamin B-12 (CYANOCOBALAMIN) 1000 MCG tablet Take 1,000 mcg by mouth daily.    . vitamin C (ASCORBIC ACID) 500 MG tablet Take 500 mg by mouth daily.     No current facility-administered medications for this encounter.    Physical Findings: In general this is a well appearing Caucasian male in no acute distress. He's alert and oriented x4 and appropriate throughout the examination. Cardiopulmonary assessment is negative for acute distress and he exhibits normal effort.   Lab Findings: Lab Results  Component Value Date   WBC 6.3 09/16/2020   HGB 15.0 09/16/2020   HCT 44.5 09/16/2020   MCV 94.1 09/16/2020   PLT 172 09/16/2020    Radiographic Findings:  Patient underwent CT imaging in our clinic for post implant dosimetry. The CT will be reviewed by Dr. Tammi [name]  to confirm there is an adequate distribution of radioactive seeds throughout the prostate gland and ensure that there are no seeds in or near the rectum. We suspect the final radiation plan and dosimetry will show appropriate coverage of the prostate gland. He understands that we will call and inform him of any unexpected findings on further review of his imaging and dosimetry.  Impression/Plan: 70 y.o. gentleman with Stage T1c adenocarcinoma of the prostate with Gleason score of 3+4, and PSA of 5.32. The patient is recovering from the  effects of radiation. His urinary symptoms should gradually improve over the next 4-6 months. We talked about this today. He was started on Flomax after his procedure and is encouraged by his improvement already and is otherwise pleased with his outcome. We also talked about long-term follow-up for prostate cancer following seed implant. He will continue taking the Flomax daily as prescribed and understands that ongoing PSA determinations and digital rectal exams will help perform surveillance to rule out disease recurrence. He had a follow up appointment with Steve Crocker, NP on 10/13/20 and is scheduled for labs on 12/06/20 and to see Dr. Diona [name] the following week. He understands what to expect with his PSA measures. Patient was also educated today about some of the long-term effects from radiation including a small risk for rectal bleeding and possibly erectile dysfunction. We talked about some of the general management approaches to these potential complications. However, I did encourage the patient to contact our office or return at any point if he has questions or concerns related to his previous radiation and prostate cancer.    Steve Johns, PA-C

## 2020-10-27 NOTE — Progress Notes (Signed)
Mr Kafer is here today for a post -seed appointment . Patient reports  Urgency Yes Leakage Sometime Bowels  Some diarrhea resolve on it own Nocturia  X 4 Dysuria None Urine stream sometime its moderate sometime its weak Fatigue moderate Hematuria None Emptying bladder Not completely Next appointment with urologist   February 2022 Ipps Score 23 Vitals:   10/27/20 1332  BP: 127/71  Pulse: 68  Resp: 18  Temp: 97.8 F (36.6 C)  TempSrc: Temporal  SpO2: 98%  Weight: 88.9 kg  Height: 6\' 3"  (1.905 m)

## 2020-10-27 NOTE — Progress Notes (Signed)
  Radiation Oncology         [phone] ________________________________  Name: Steve [name] MRN: [id number]  Date: 10/27/2020  DOB: 1950/11/08  COMPLEX SIMULATION NOTE  NARRATIVE:  The patient was brought to the [facility] suite today following prostate seed implantation approximately one month ago.  Identity was confirmed.  All relevant records and images related to the planned course of therapy were reviewed.  Then, the patient was set-up supine.  CT images were obtained.  The CT images were loaded into the planning software.  Then the prostate and rectum were contoured.  Treatment planning then occurred.  The implanted iodine 125 seeds were identified by the physics staff for projection of radiation distribution  I have requested : 3D Simulation  I have requested a DVH of the following structures: Prostate and rectum.    ________________________________  Sheral Apley Tammi Klippel, M.D.

## 2020-10-28 ENCOUNTER — Encounter: Payer: Self-pay | Admitting: Medical Oncology

## 2020-10-31 ENCOUNTER — Encounter: Payer: Self-pay | Admitting: Internal Medicine

## 2020-11-01 ENCOUNTER — Telehealth (INDEPENDENT_AMBULATORY_CARE_PROVIDER_SITE_OTHER): Payer: 59 | Admitting: Internal Medicine

## 2020-11-01 ENCOUNTER — Encounter: Payer: Self-pay | Admitting: Internal Medicine

## 2020-11-01 DIAGNOSIS — U071 COVID-19: Secondary | ICD-10-CM | POA: Insufficient documentation

## 2020-11-01 NOTE — Assessment & Plan Note (Signed)
Acute Symptoms started about 1 week ago Tested positive with a home test yesterday 10/31/2020 Symptoms relatively mild Discussed symptomatic treatment with over-the-counter supplements and cold medications We both agree that he does not need any further treatment at this time If his symptoms change he will let me know Advised rest, fluids

## 2020-11-01 NOTE — Progress Notes (Signed)
Virtual Visit via Video Note  I connected with Steve [name] on 11/01/20 at  3:00 PM EST by a video enabled telemedicine application and verified that I am speaking with the correct person using two identifiers.   I discussed the limitations of evaluation and management by telemedicine and the availability of in person appointments. The patient expressed understanding and agreed to proceed.  Present for the visit:  Myself, Dr Billey Gosling, Martin Majestic.  The patient is currently at home and I am in the office.    No referring provider.    History of Present Illness: This is an acute visit for covid positive.  He thinks his symptoms started about 1 week ago. They were mild and he is not exactly sure. He states fatigue a few days ago that lasted one night. He states some mild nasal congestion, mild sinus pressure, last week his ears felt stuffy and he has a little hoarseness. He denies any fevers, change in taste or smell, cough, wheeze or shortness of breath. He has not had any headaches. He only tested himself for Covid yesterday because his wife tested positive. He thought this was just a mild cold. He has not been into the office since then.  Taking emergen-C and zinc.     He has prostate cancer and has had seed implantation last month.  He has had both Covid vaccines and the booster.  Review of Systems  Constitutional: Positive for malaise/fatigue (a few days ago). Negative for chills and fever.  HENT: Positive for congestion (mild) and sinus pain (sinus pressure). Negative for ear pain (ears felt stuffy last week) and sore throat.        Hoarseness.  No change in taste, smell  Respiratory: Negative for cough, shortness of breath and wheezing.   Neurological: Negative for headaches.     Social History   Socioeconomic History  . Marital status: Married    Spouse name: Not on file  . Number of children: Not on file  . Years of education: Not on file  . Highest  education level: Not on file  Occupational History  . Occupation: [employer]IDENT [employer]  Tobacco Use  . Smoking status: Former Smoker    Packs/day: 0.50    Years: 10.00    Pack years: 5.00    Types: Cigarettes    Quit date: 1983    Years since quitting: 39.0  . Smokeless tobacco: Never Used  . Tobacco comment: smoked 1972-1982, up to 1/2 ppd  Vaping Use  . Vaping Use: Never used  Substance and Sexual Activity  . Alcohol use: Yes    Alcohol/week: 5.0 - 6.0 standard drinks    Types: 5 - 6 Standard drinks or equivalent per week    Comment:  2 -3 drinks per week  . Drug use: No  . Sexual activity: Not Currently  Other Topics Concern  . Not on file  Social History Narrative   GETS REG EXERCISE 4x weekly   NO DIET   Lives with wife in a 2 story home.  Has 2 children.     Works as a Insurance underwriter.     Education: college   Right Handed   Drinks Caffeine    Lives in a two story home   Social Determinants of Health   Financial Resource Strain: Not on file  Food Insecurity: Not on file  Transportation Needs: Not on file  Physical Activity: Not on file  Stress: Not on file  Social Connections: Not on file     Observations/Objective: Appears well in NAD Breathing normally Skin appears warm and dry  Assessment and Plan:  See Problem List for Assessment and Plan of chronic medical problems.   Follow Up Instructions:    I discussed the assessment and treatment plan with the patient. The patient was provided an opportunity to ask questions and all were answered. The patient agreed with the plan and demonstrated an understanding of the instructions.   The patient was advised to call back or seek an in-person evaluation if the symptoms worsen or if the condition fails to improve as anticipated.    Binnie Rail, MD

## 2020-11-03 ENCOUNTER — Other Ambulatory Visit: Payer: Self-pay | Admitting: Internal Medicine

## 2020-11-16 ENCOUNTER — Other Ambulatory Visit: Payer: Self-pay | Admitting: Internal Medicine

## 2020-12-04 ENCOUNTER — Encounter (HOSPITAL_COMMUNITY): Payer: Self-pay | Admitting: Emergency Medicine

## 2020-12-04 ENCOUNTER — Emergency Department (HOSPITAL_COMMUNITY)
Admission: EM | Admit: 2020-12-04 | Discharge: 2020-12-04 | Disposition: A | Discharge status: Home / Self Care | Payer: 59 | Attending: Emergency Medicine | Admitting: Emergency Medicine

## 2020-12-04 ENCOUNTER — Other Ambulatory Visit: Payer: Self-pay

## 2020-12-04 DIAGNOSIS — Z7982 Long term (current) use of aspirin: Secondary | ICD-10-CM | POA: Diagnosis not present

## 2020-12-04 DIAGNOSIS — R300 Dysuria: Secondary | ICD-10-CM | POA: Diagnosis present

## 2020-12-04 DIAGNOSIS — Z85828 Personal history of other malignant neoplasm of skin: Secondary | ICD-10-CM | POA: Insufficient documentation

## 2020-12-04 DIAGNOSIS — Z8616 Personal history of COVID-19: Secondary | ICD-10-CM | POA: Insufficient documentation

## 2020-12-04 DIAGNOSIS — Z8673 Personal history of transient ischemic attack (TIA), and cerebral infarction without residual deficits: Secondary | ICD-10-CM | POA: Diagnosis not present

## 2020-12-04 DIAGNOSIS — R39198 Other difficulties with micturition: Secondary | ICD-10-CM | POA: Diagnosis not present

## 2020-12-04 DIAGNOSIS — Z87891 Personal history of nicotine dependence: Secondary | ICD-10-CM | POA: Insufficient documentation

## 2020-12-04 DIAGNOSIS — N4281 Prostatodynia syndrome: Secondary | ICD-10-CM

## 2020-12-04 DIAGNOSIS — Z8546 Personal history of malignant neoplasm of prostate: Secondary | ICD-10-CM | POA: Diagnosis not present

## 2020-12-04 LAB — URINALYSIS, ROUTINE W REFLEX MICROSCOPIC
Bilirubin Urine: NEGATIVE
Glucose, UA: NEGATIVE mg/dL
Hgb urine dipstick: NEGATIVE
Ketones, ur: 5 mg/dL — AB
Leukocytes,Ua: NEGATIVE
Nitrite: NEGATIVE
Protein, ur: NEGATIVE mg/dL
Specific Gravity, Urine: 1.017 (ref 1.005–1.030)
pH: 5 (ref 5.0–8.0)

## 2020-12-04 LAB — BASIC METABOLIC PANEL
Anion gap: 9 (ref 5–15)
BUN: 12 mg/dL (ref 8–23)
CO2: 26 mmol/L (ref 22–32)
Calcium: 8.4 mg/dL — ABNORMAL LOW (ref 8.9–10.3)
Chloride: 103 mmol/L (ref 98–111)
Creatinine, Ser: 0.77 mg/dL (ref 0.61–1.24)
GFR, Estimated: >60 mL/min (ref >60)
Glucose, Bld: 113 mg/dL — ABNORMAL HIGH (ref 70–99)
Potassium: 3.8 mmol/L (ref 3.5–5.1)
Sodium: 138 mmol/L (ref 135–145)

## 2020-12-04 LAB — CBC WITH DIFFERENTIAL/PLATELET
Abs Immature Granulocytes: 0.05 10*3/uL (ref 0.00–0.07)
Basophils Absolute: 0 10*3/uL (ref 0.0–0.1)
Basophils Relative: 0 %
Eosinophils Absolute: 0.2 10*3/uL (ref 0.0–0.5)
Eosinophils Relative: 1 %
HCT: 36.7 % — ABNORMAL LOW (ref 39.0–52.0)
Hemoglobin: 12.1 g/dL — ABNORMAL LOW (ref 13.0–17.0)
Immature Granulocytes: 0 %
Lymphocytes Relative: 9 %
Lymphs Abs: 1.1 10*3/uL (ref 0.7–4.0)
MCH: 30.9 pg (ref 26.0–34.0)
MCHC: 33 g/dL (ref 30.0–36.0)
MCV: 93.6 fL (ref 80.0–100.0)
Monocytes Absolute: 1.1 10*3/uL — ABNORMAL HIGH (ref 0.1–1.0)
Monocytes Relative: 10 %
Neutro Abs: 9.1 10*3/uL — ABNORMAL HIGH (ref 1.7–7.7)
Neutrophils Relative %: 80 %
Platelets: 177 10*3/uL (ref 150–400)
RBC: 3.92 MIL/uL — ABNORMAL LOW (ref 4.22–5.81)
RDW: 13.2 % (ref 11.5–15.5)
WBC: 11.5 10*3/uL — ABNORMAL HIGH (ref 4.0–10.5)
nRBC: 0 % (ref 0.0–0.2)

## 2020-12-04 MED ORDER — KETOROLAC TROMETHAMINE 30 MG/ML IJ SOLN: 30.0000 mg | Freq: Once | INTRAMUSCULAR | Status: DC

## 2020-12-04 MED ORDER — IBUPROFEN 800 MG PO TABS: 800.0000 mg | ORAL_TABLET | Freq: Three times a day (TID) | ORAL | 0 refills | Status: DC

## 2020-12-04 MED ORDER — KETOROLAC TROMETHAMINE 30 MG/ML IJ SOLN
30.0000 mg | Freq: Once | INTRAMUSCULAR | Status: AC
  Administered 2020-12-04: 30 mg via INTRAMUSCULAR
  Filled 2020-12-04: qty 1

## 2020-12-04 MED ORDER — HYDROCODONE-ACETAMINOPHEN 5-325 MG PO TABS: 1.0000 | ORAL_TABLET | ORAL | 0 refills | Status: DC | PRN

## 2020-12-04 NOTE — Discharge Instructions (Addendum)
Follow up with urology as scheduled.  Avoid prolonged sitting

## 2020-12-04 NOTE — ED Notes (Signed)
Upon bladder scan, scan shown 180mL when scanned twice. Ashok Cordia, MD made aware.

## 2020-12-04 NOTE — ED Triage Notes (Signed)
Pt arrives POV with complaints of dysuria. Pt states he had radioactive seed implants in his prostate on Dec 13th. 2 days ago pt began experiencing dysuria with painful and swollen prostate.

## 2020-12-04 NOTE — ED Provider Notes (Signed)
[facility] DEPT Provider Note   CSN: [id number] Arrival date & time: 12/04/20  4098     History Chief Complaint  Patient presents with  . Dysuria    Steve [name] is a 70 y.o. male.  Pt has a history of prostate cancer with radioactive seed implants.  Pt reports he recently went on a road trip and has had difficulty urinating and prostate pain since.   The history is provided by the patient. No language interpreter was used.  Dysuria Presenting symptoms: dysuria   Relieved by:  Nothing Worsened by:  Nothing Ineffective treatments:  NSAIDs Associated symptoms: no abdominal pain   Risk factors: no recent infection        Past Medical History:  Diagnosis Date  . Arthritis    Right shoulder  . Diverticulosis 2013   Moderate   . First degree AV block 12/09/2018   noted on EKG  . GERD (gastroesophageal reflux disease)   . Grade I diastolic dysfunction 11/91/4782   Noted on ECHO   . Hemifacial spasm   . History of loop recorder   . Hx of colonic polyp 2008 & 2013   X2  . Hyperlipidemia   . Internal hemorrhoids 2013  . PFO (patent foramen ovale)    History of   . Prostate cancer ([place])   . 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  . Sleep apnea    Resolved  . Squamous cell skin cancer    Dr.[place]  . Stricture esophagus 2013    dilation X 1  . Stroke [facility]) 2016   left semiovale stroke   . TIA (transient ischemic attack) 01/2015  . Ulcerative esophagitis 2013    Patient Active Problem List   Diagnosis Date Noted  . COVID-19 11/01/2020  . Malignant neoplasm of prostate ([place]) 05/03/2020  . Vertigo, benign paroxysmal 12/29/2019  . Exposure to COVID-19 virus 10/08/2019  . Prediabetes 08/22/2019  . 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  . 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 Difficulty sleeping 02/06/2019  . Preoperative clearance 01/29/2019  . Intermediate stage nonexudative  age-related macular degeneration of both eyes 07/30/2018  . Hair thinning 08/06/2017  . B12 deficiency 08/06/2017  . Status post placement of implantable loop recorder 04/24/2016  . Squamous cell carcinoma 03/09/2016  . Patent foramen ovale with atrial septal aneurysm, s/p closure 6/19 12/08/2015  . GERD (gastroesophageal reflux disease) 11/09/2015  . History of TIA (transient ischemic attack) 09/30/2015  . History of esophageal stricture 04/20/2013  . VITAMIN D DEFICIENCY 06/07/2010  . BPH (benign prostatic hyperplasia) 06/07/2010  . FIRST DEGREE ATRIOVENTRICULAR BLOCK 02/28/2009  . History of colonic polyps 06/18/2008  . HYPERLIPIDEMIA 08/26/2007  . 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 SKIN CANCER, HX OF 07/24/2007  . Sleep apnea 07/15/2007    Past Surgical History:  Procedure Laterality Date  . COLONOSCOPY W/ POLYPECTOMY  2013, 2018 last done   tubular adenoma  . COLONOSCOPY W/ POLYPECTOMY  2008   hyperplastic  . CYSTOSCOPY N/A 09/19/2020   Procedure: CYSTOSCOPY FLEXIBLE;  Surgeon: Franchot Gallo, MD;  Location: [facility];  Service: Urology;  Laterality: N/A;  . EP IMPLANTABLE DEVICE N/A 01/03/2016   Procedure: Loop Recorder Insertion;  Surgeon: Sanda Klein, MD;  Location: [facility];  Service: Cardiovascular;  Laterality: N/A;  . MOHS SURGERY     x 3  . NASAL SINUS SURGERY  15 yrs  ago  . PATENT FORAMEN OVALE(PFO) CLOSURE N/A 03/27/2018   Procedure: PATENT FORAMEN OVALE (PFO) CLOSURE;  Surgeon: Sherren Mocha, MD;  Location: [facility];  Service: Cardiovascular;  Laterality: N/A;  . PROSTATE BIOPSY  2021  . RADIOACTIVE SEED IMPLANT N/A 09/19/2020   Procedure: RADIOACTIVE SEED IMPLANT/BRACHYTHERAPY IMPLANT;  Surgeon: Franchot Gallo, MD;  Location[facility];  Service: Urology;  Laterality: N/A;  90 MINS  . REVERSE SHOULDER ARTHROPLASTY Right 03/10/2019   Procedure: REVERSE SHOULDER  ARTHROPLASTY;  Surgeon: Marchia Bond, MD;  Location: [facility];  Service: Orthopedics;  Laterality: Right;  . ROTATOR CUFF REPAIR Left yrs ago  . SPACE OAR INSTILLATION N/A 09/19/2020   Procedure: SPACE OAR INSTILLATION;  Surgeon: Franchot Gallo, MD;  Location: [facility];  Service: Urology;  Laterality: N/A;  . TEE WITHOUT CARDIOVERSION N/A 01/06/2018   Procedure: TRANSESOPHAGEAL ECHOCARDIOGRAM (TEE);  Surgeon: Sanda Klein, MD;  Location: [facility];  Service: Cardiovascular;  Laterality: N/A;  . torn biceps tendon Left yrs ago   surgical repair  . uvulectomy & tonsillectomy  2008   Dr Wilburn Cornelia       Family History  Problem Relation Age of Onset  . Uterine cancer Mother   . Breast cancer Mother        Deceased, 27  . Stroke Father        > 65  . Prostate cancer Father        Deceased, 46  . Diabetes Daughter        type 1  . Stroke Paternal Uncle        > 55  . Heart attack Maternal Grandfather         >55/mid-60s  . Hyperlipidemia Son   . Healthy Brother   . Colon cancer Neg Hx   . Rectal cancer Neg Hx   . Stomach cancer Neg Hx   . Esophageal cancer Neg Hx     Social History   Tobacco Use  . Smoking status: Former Smoker    Packs/day: 0.50    Years: 10.00    Pack years: 5.00    Types: Cigarettes    Quit date: 1983    Years since quitting: 39.1  . Smokeless tobacco: Never Used  . Tobacco comment: smoked 1972-1982, up to 1/2 ppd  Vaping Use  . Vaping Use: Never used  Substance Use Topics  . Alcohol use: Yes    Alcohol/week: 5.0 - 6.0 standard drinks    Types: 5 - 6 Standard drinks or equivalent per week    Comment:  2 -3 drinks per week  . Drug use: No    Home Medications Prior to Admission medications   Medication Sig Start Date End Date Taking? Authorizing Provider  aspirin 81 MG tablet Take 81 mg by mouth daily.   Yes [provider]  botulinum toxin Type A (BOTOX) 100 units SOLR injection Botox 100 unit injection   Take 100 units every 3 months by injection route.   Yes [provider]  cholecalciferol (VITAMIN D3) 25 MCG (1000 UNIT) tablet Take 1,000 Units by mouth daily.   Yes [provider]  fluticasone (FLONASE) 50 MCG/ACT nasal spray ONE SPRAY IN EACH NOSTRIL TWICE DAILY ASNEEDED -USE CROSSOVER TECHNIQUE DISCUSSED Patient taking differently: Place 1 spray into both nostrils 2 (two) times daily as needed for allergies. 11/16/20  Yes Burns, Claudina Lick, MD  HYDROcodone-acetaminophen (NORCO/VICODIN) 5-325 MG tablet Take 1 tablet by mouth every 4 (four) hours  as needed for moderate pain. 12/04/20 12/04/21 Yes Fransico Meadow, PA-C  ibuprofen (ADVIL) 800 MG tablet Take 1 tablet (800 mg total) by mouth 3 (three) times daily. 12/04/20  Yes Caryl Ada K, PA-C  Multiple Vitamins-Minerals (PRESERVISION AREDS 2) CAPS Take 1 capsule by mouth 2 (two) times a day.   Yes [provider]  pantoprazole (PROTONIX) 40 MG tablet TAKE ONE TABLET DAILY Patient taking differently: Take 40 mg by mouth daily. 11/03/20  Yes Burns, Claudina Lick, MD  simvastatin (ZOCOR) 40 MG tablet TAKE ONE TABLET AT BEDTIME Patient taking differently: Take 40 mg by mouth at bedtime. 11/03/20  Yes Burns, Claudina Lick, MD  tamsulosin (FLOMAX) 0.4 MG CAPS capsule Take 0.4 mg by mouth daily. 10/13/20  Yes [provider]  vardenafil (LEVITRA) 20 MG tablet Take 20 mg by mouth daily as needed for erectile dysfunction.   Yes [provider]  vitamin B-12 (CYANOCOBALAMIN) 1000 MCG tablet Take 1,000 mcg by mouth daily.   Yes [provider]  vitamin C (ASCORBIC ACID) 500 MG tablet Take 500 mg by mouth daily.   Yes [provider]    Allergies    Pollen extract  Review of Systems   Review of Systems  Gastrointestinal: Negative for abdominal pain.  Genitourinary: Positive for dysuria.  All other systems reviewed and are negative.   Physical Exam Updated Vital Signs BP 131/72   Pulse 81   Temp 98.4 F  (36.9 C) (Oral)   Resp 18   SpO2 99%   Physical Exam Vitals and nursing note reviewed.  Constitutional:      Appearance: He is well-developed and well-nourished.  HENT:     Head: Normocephalic and atraumatic.  Eyes:     Conjunctiva/sclera: Conjunctivae normal.  Cardiovascular:     Rate and Rhythm: Normal rate.     Heart sounds: No murmur heard.   Pulmonary:     Effort: Pulmonary effort is normal.  Abdominal:     Palpations: Abdomen is soft.  Musculoskeletal:        General: No edema. Normal range of motion.     Cervical back: Neck supple.  Skin:    General: Skin is warm and dry.  Neurological:     General: No focal deficit present.     Mental Status: He is alert.  Psychiatric:        Mood and Affect: Mood and affect and mood normal.     ED Results / Procedures / Treatments   Labs (all labs ordered are listed, but only abnormal results are displayed) Labs Reviewed  URINALYSIS, ROUTINE W REFLEX MICROSCOPIC - Abnormal; Notable for the following components:      Result Value   Ketones, ur 5 (*)    All other components within normal limits  CBC WITH DIFFERENTIAL/PLATELET - Abnormal; Notable for the following components:   WBC 11.5 (*)    RBC 3.92 (*)    Hemoglobin 12.1 (*)    HCT 36.7 (*)    Neutro Abs 9.1 (*)    Monocytes Absolute 1.1 (*)    All other components within normal limits  BASIC METABOLIC PANEL - Abnormal; Notable for the following components:   Glucose, Bld 113 (*)    Calcium 8.4 (*)    All other components within normal limits    EKG None  Radiology No results found.  Procedures Procedures   Medications Ordered in ED Medications  ketorolac (TORADOL) 30 MG/ML injection 30 mg (30 mg Intramuscular Given  12/04/20 1134)    ED Course  I have reviewed the triage vital signs and the nursing notes.  Pertinent labs & imaging results that were available during my care of the patient were reviewed by me and considered in my medical decision making  (see chart for details).    MDM Rules/Calculators/A&P                          MDM:  Pt given toradol IM.  BMet shows normal creatine and BUN.  ua is normal.  I spoke to Dr. Junious Silk who advised ibuprofen.  Pt given rx for ibuprofen and hydrocodone.  Final Clinical Impression(s) / ED Diagnoses Final diagnoses:  Prostate pain    Rx / DC Orders ED Discharge Orders         Ordered    HYDROcodone-acetaminophen (NORCO/VICODIN) 5-325 MG tablet  Every 4 hours PRN        12/04/20 1156    ibuprofen (ADVIL) 800 MG tablet  3 times daily        12/04/20 1156        An After Visit Summary was printed and given to the patient.   Fransico Meadow, Hershal Coria 12/04/20 1230    Lajean Saver, MD 12/04/20 [phone]

## 2020-12-05 ENCOUNTER — Other Ambulatory Visit: Payer: Self-pay | Admitting: Internal Medicine

## 2020-12-06 ENCOUNTER — Encounter: Payer: Self-pay | Admitting: Radiation Oncology

## 2020-12-06 ENCOUNTER — Ambulatory Visit
Admission: RE | Admit: 2020-12-06 | Discharge: 2020-12-06 | Disposition: A | Discharge status: Home / Self Care | Payer: 59 | Source: Ambulatory Visit | Attending: Radiation Oncology | Admitting: Radiation Oncology

## 2020-12-06 DIAGNOSIS — C61 Malignant neoplasm of prostate: Secondary | ICD-10-CM | POA: Diagnosis present

## 2020-12-08 NOTE — Progress Notes (Signed)
  Radiation Oncology         ([phone]) [phone] ________________________________  Name: Steve [name] MRN: [id number]  Date: 12/06/2020  DOB: [birth year]  3D Planning Note   Prostate Brachytherapy Post-Implant Dosimetry  Diagnosis: 70 y.o. gentleman with Stage T1c adenocarcinoma of the prostate with Gleason score of 3+4, and PSA of 5.32.  Narrative: On a previous date, Steve [name] returned following prostate seed implantation for post implant planning. He underwent CT scan complex simulation to delineate the three-dimensional structures of the pelvis and demonstrate the radiation distribution.  Since that time, the seed localization, and complex isodose planning with dose volume histograms have now been completed.  Results:   Prostate Coverage - The dose of radiation delivered to the 90% or more of the prostate gland (D90) was 93.07% of the prescription dose. This exceeds our goal of greater than 90%. Rectal Sparing - The volume of rectal tissue receiving the prescription dose or higher was 0.0 cc. This falls under our thresholds tolerance of 1.0 cc.  Impression: The prostate seed implant appears to show adequate target coverage and appropriate rectal sparing.  Plan:  The patient will continue to follow with urology for ongoing PSA determinations. I would anticipate a high likelihood for local tumor control with minimal risk for rectal morbidity.  ________________________________  Sheral Apley Tammi Klippel, M.D.

## 2021-01-13 ENCOUNTER — Other Ambulatory Visit: Payer: Self-pay | Admitting: Internal Medicine

## 2021-02-02 ENCOUNTER — Telehealth: Payer: Self-pay

## 2021-02-02 ENCOUNTER — Other Ambulatory Visit: Payer: Self-pay

## 2021-02-02 MED ORDER — HYDROCORTISONE ACETATE 25 MG RE SUPP: 25.0000 mg | Freq: Every day | RECTAL | 3 refills | Status: DC

## 2021-02-02 NOTE — Telephone Encounter (Signed)
Script sent to pharmacy as requested

## 2021-02-02 NOTE — Telephone Encounter (Signed)
-----   Message from Irene Shipper, MD sent at 02/02/2021  4:23 PM EDT ----- Regarding: Call in prescription Steve [name], [name] is having problems with his hemorrhoids.  Please call in Anusol [facility] suppository; #30; 1 per rectum at night; 3 refills. Thanks, JP

## 2021-02-21 ENCOUNTER — Telehealth: Payer: Self-pay

## 2021-02-21 MED ORDER — HYDROCORTISONE (PERIANAL) 2.5 % EX CREA: 1.0000 "application " | TOPICAL_CREAM | Freq: Every day | CUTANEOUS | 1 refills | Status: DC

## 2021-02-21 NOTE — Telephone Encounter (Signed)
Anusol suppositories not covered by Medicare.  Sent Anusol cream to pharmacy instead.  Sent mychart message to patient explaining how to use with OTC suppositories

## 2021-03-08 ENCOUNTER — Other Ambulatory Visit: Payer: Self-pay | Admitting: Internal Medicine

## 2021-03-20 ENCOUNTER — Other Ambulatory Visit: Payer: Self-pay | Admitting: Internal Medicine

## 2021-06-07 ENCOUNTER — Other Ambulatory Visit: Payer: Self-pay | Admitting: Internal Medicine

## 2021-06-23 ENCOUNTER — Other Ambulatory Visit: Payer: Self-pay | Admitting: Internal Medicine

## 2021-07-03 ENCOUNTER — Ambulatory Visit: Payer: 59 | Admitting: Neurology

## 2021-07-03 ENCOUNTER — Other Ambulatory Visit: Payer: Self-pay

## 2021-07-03 ENCOUNTER — Encounter: Payer: Self-pay | Admitting: Neurology

## 2021-07-03 VITALS — BP 136/78 | HR 68 | Resp 18 | Ht 75.0 in | Wt 204.0 lb

## 2021-07-03 DIAGNOSIS — Z8673 Personal history of transient ischemic attack (TIA), and cerebral infarction without residual deficits: Secondary | ICD-10-CM | POA: Diagnosis not present

## 2021-07-03 NOTE — Patient Instructions (Signed)
Please follow-up with your primary care doctor for annual physical and labs  Return to clinic 1 year

## 2021-07-03 NOTE — Progress Notes (Signed)
d   Follow-up Visit   Date: 07/03/21    Steve [name] MRN: [id number] DOB: [birth year]   Interim History: Steve [name] is a 70 y.o. right-handed Caucasian male with right hemifacial spasm, hyperlipidemia, BPH, GERD, and left semiovale stroke (2016, manifesting with right arm weakness and facial numbness), prostate cancer (2021) returning to the clinic for follow-up of cryptogenic stroke.   History of present illness: On April 23rd 2015, he woke up with right arm heaviness/weakness and right sided paresthesias. There was no weakness of the right lower extremity or the face.  Symptoms resolved within about 6 hours.  He has noticed that his signature and hand writing is showing subtle differences.  He stopped taking aspirin several years ago and restarted aspirin 81mg  when these symptoms started.  MRI brain showed small left centrum semiovale stroke.  There is no evidence of large vessel occlusion. Echocardiogram is normal except moderate right-to-left shunt without atrial septal aneurysm. He had PFO closure on 03/27/18.  He has seen Dr. Erling Cruz in the late 1990s for hemifacial spasm and gets botox injected.  In 2019, he began having left sided tightness of the thigh . Sometimes, he has difficulty raising the left leg, such as with wearing pants.  There is some associated low back pain.  He has seen [facility] in the past and had MRI lumbar spine which showed degenerative changes at L4-5.  UPDATE 07/03/2021:  He is here for 1 year follow-up visit.  He has completed treatment for prostate cancer and doing well.  Treatment was complicated with infection and pain.  From a neurological standpoint, he is doing well and has no new complaints.  No interval TIAs or stroke.  He continues to have his loop recorder and is thinking of having it removed since the battery has died.   Medications:  Current Outpatient Medications on File Prior to Visit  Medication Sig Dispense Refill   aspirin 81  MG tablet Take 81 mg by mouth daily.     botulinum toxin Type A (BOTOX) 100 units SOLR injection Botox 100 unit injection  Take 100 units every 3 months by injection route.     cholecalciferol (VITAMIN D3) 25 MCG (1000 UNIT) tablet Take 1,000 Units by mouth daily.     fluticasone (FLONASE) 50 MCG/ACT nasal spray ONE SPRAY IN EACH NOSTRIL TWICE DAILY ASNEEDED -USE CROSSOVER TECHNIQUE DISCUSSED 16 g 0   HYDROcodone-acetaminophen (NORCO/VICODIN) 5-325 MG tablet Take 1 tablet by mouth every 4 (four) hours as needed for moderate pain. 20 tablet 0   hydrocortisone (ANUSOL-HC) 2.5 % rectal cream Place 1 application rectally at bedtime. 30 g 1   hydrocortisone (ANUSOL-HC) 25 MG suppository Place 1 suppository (25 mg total) rectally at bedtime. 30 suppository 3   ibuprofen (ADVIL) 800 MG tablet Take 1 tablet (800 mg total) by mouth 3 (three) times daily. 15 tablet 0   Multiple Vitamins-Minerals (PRESERVISION AREDS 2) CAPS Take 1 capsule by mouth 2 (two) times a day.     pantoprazole (PROTONIX) 40 MG tablet TAKE ONE TABLET DAILY 30 tablet 2   simvastatin (ZOCOR) 40 MG tablet TAKE ONE TABLET AT BEDTIME (Patient taking differently: Take 40 mg by mouth at bedtime.) 90 tablet 3   tamsulosin (FLOMAX) 0.4 MG CAPS capsule Take 0.4 mg by mouth daily.     vardenafil (LEVITRA) 20 MG tablet Take 20 mg by mouth daily as needed for erectile dysfunction.     vitamin B-12 (CYANOCOBALAMIN) 1000 MCG tablet Take 1,000 mcg  by mouth daily.     vitamin C (ASCORBIC ACID) 500 MG tablet Take 500 mg by mouth daily.     No current facility-administered medications on file prior to visit.    Allergies:  Allergies  Allergen Reactions   Pollen Extract     Eyes itch and water     Vital Signs:  BP 136/78   Pulse 68   Resp 18   Ht 6\' 3"  (1.905 m)   Wt 204 lb (92.5 kg)   SpO2 98%   BMI 25.50 kg/m   Neurological Exam: MENTAL STATUS including orientation to time, place, person, recent and remote memory, attention span and  concentration, language, and fund of knowledge is normal.  Speech is not dysarthric.  CRANIAL NERVES:   Normal conjugate, extra-ocular eye movements in all directions of gaze. Right facial asymmetry involving the forehead and cheek, asymmetrical smile on the right.  Mild right ptosis.  Rare right hemifacial spasm.  Palate elevates symmetrically.    MOTOR:  Motor strength is 5/5 in all extremities.  No pronator drift.  Tone is normal.    COORDINATION/GAIT:  Normal finger-to- nose-finger.  Intact rapid alternating movements bilaterally.  Gait narrow based and stable. Tandem gait intact.  Data: MRI/A brain and neck 03/18/2015: 1. Mild white matter signal changes in the left posterior centrum semiovale with subtle diffusion signal changes suggestive of subacute white matter infarct. No mass effect or hemorrhage. 2. Otherwise normal for age noncontrast MRI appearance of the brain. 3. Left greater than right carotid bifurcation atherosclerosis with no hemodynamically significant stenosis in the neck. 4.  Negative intracranial MRA.  Lab Results  Component Value Date   CHOL 167 08/21/2019   HDL 48.80 08/21/2019   LDLCALC 94 08/21/2019   LDLDIRECT 172.0 07/24/2007   TRIG 123.0 08/21/2019   CHOLHDL 3 08/21/2019   Lab Results  Component Value Date   HGBA1C 5.9 08/21/2019   Echocardiogram 03/14/2015: - Left ventricle: The cavity size was normal. Systolic function was   normal. The estimated ejection fraction was in the range of 55%  to 60%. Wall motion was normal; there were no regional wall motion abnormalities. Left ventricular diastolic function   parameters were normal. - Atrial septum: There was a moderate right-to-left atrial level shunt, in the baseline state. There was redundancy of the septum,   with borderline criteria for aneurysm.  TEE 01/13/2018: - Left ventricle: The cavity size was normal. Wall thickness was normal. Systolic function was normal. The estimated ejection   fraction  was in the range of 55% to 60%. Wall motion was normal; there were no regional wall motion abnormalities.  - Left atrium: No evidence of thrombus in the atrial cavity or appendage. - Right atrium: No evidence of thrombus in the atrial cavity or appendage. - Atrial septum: There was a small secundum atrial septal defect. Doppler showed a small bidirectional atrial level shunt, in the   baseline state. 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  MRI lumbar spine performed at [facility] 08/14/2013: 1.  L4-5: Compression of both L4 nerve roots by a disc bulge and bilateral facet DJD 2.  Diffuse facet DJD, severe on the right at L2-3 and on the left at L4-5 and on the right at health 5-S1 3.  L3-4: small left foraminal and lateral protrusion with minimal foraminal stenosis 4.  L2-3: Mild right lateral recess stenosis caused by  facet DJD.    IMPRESSION/PLAN: 1.  History of small left posterior centrum semiovale stroke manifesting with transient R arm weakness and hemisensory loss in April 2016. 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  He is s/p PFO closure in 2019.  No cardiac arrhythmia on loop recorder. Clinically, he is doing great with no recurrence of TIA/stroke. Continue aspirin 81mg  and zocor 40mg  daily.  He is due for lipid panel and will get labs drawn at his PCP's office with his annual physical.  2. Right hemifacial spasm, followed by Dr. Zonia Kief in [place], [place]  Return to clinic in 1 year   Thank you for allowing me to participate in patient's care.  If I can answer any additional questions, I would be pleased to do so.    Sincerely,    Adreena Willits K. Posey Pronto, DO

## 2021-07-19 ENCOUNTER — Encounter: Payer: 59 | Admitting: Internal Medicine

## 2021-07-26 ENCOUNTER — Encounter: Payer: Self-pay | Admitting: Internal Medicine

## 2021-07-26 ENCOUNTER — Ambulatory Visit (INDEPENDENT_AMBULATORY_CARE_PROVIDER_SITE_OTHER): Payer: Commercial Managed Care - PPO | Admitting: Internal Medicine

## 2021-07-26 ENCOUNTER — Other Ambulatory Visit: Payer: Self-pay

## 2021-07-26 VITALS — BP 130/78 | HR 69 | Temp 98.1°F | Ht 75.0 in | Wt 199.0 lb

## 2021-07-26 DIAGNOSIS — E559 Vitamin D deficiency, unspecified: Secondary | ICD-10-CM

## 2021-07-26 DIAGNOSIS — N400 Enlarged prostate without lower urinary tract symptoms: Secondary | ICD-10-CM

## 2021-07-26 DIAGNOSIS — Z1331 Encounter for screening for depression: Secondary | ICD-10-CM

## 2021-07-26 DIAGNOSIS — Z8673 Personal history of transient ischemic attack (TIA), and cerebral infarction without residual deficits: Secondary | ICD-10-CM

## 2021-07-26 DIAGNOSIS — E782 Mixed hyperlipidemia: Secondary | ICD-10-CM | POA: Diagnosis not present

## 2021-07-26 DIAGNOSIS — R7303 Prediabetes: Secondary | ICD-10-CM

## 2021-07-26 DIAGNOSIS — K219 Gastro-esophageal reflux disease without esophagitis: Secondary | ICD-10-CM | POA: Diagnosis not present

## 2021-07-26 DIAGNOSIS — Z0001 Encounter for general adult medical examination with abnormal findings: Secondary | ICD-10-CM | POA: Diagnosis not present

## 2021-07-26 DIAGNOSIS — E538 Deficiency of other specified B group vitamins: Secondary | ICD-10-CM

## 2021-07-26 NOTE — Progress Notes (Signed)
Subjective:    Patient ID: Steve [name], male    DOB: August 31, 1951, 70 y.o.   MRN: [id number]   This visit occurred during the SARS-CoV-2 public health emergency.  Safety protocols were in place, including screening questions prior to the visit, additional usage of staff PPE, and extensive cleaning of exam room while observing appropriate contact time as indicated for disinfecting solutions.   HPI He is here for a physical exam.   He has no concerns.    Medications and allergies reviewed with patient and updated if appropriate.  Patient Active Problem List   Diagnosis Date Noted   COVID-19 11/01/2020   Malignant neoplasm of prostate ([place]) 05/03/2020   Vertigo, benign paroxysmal 12/29/2019   Prediabetes 08/22/2019   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   S/P reverse total shoulder arthroplasty, right 03/10/2019   Difficulty sleeping 02/06/2019   Intermediate stage nonexudative age-related macular degeneration of both eyes 07/30/2018   Hair thinning 08/06/2017   B12 deficiency 08/06/2017   Status post placement of implantable loop recorder 04/24/2016   Squamous cell carcinoma 03/09/2016   Patent foramen ovale with atrial septal aneurysm, s/p closure 6/19 12/08/2015   GERD (gastroesophageal reflux disease) 11/09/2015   History of TIA (transient ischemic attack) - Dr Posey Pronto 09/30/2015   History of esophageal stricture 04/20/2013   Vitamin D deficiency 06/07/2010   BPH (benign prostatic hyperplasia) - urology 06/07/2010   FIRST DEGREE ATRIOVENTRICULAR BLOCK 02/28/2009   History of colonic polyps 06/18/2008   HYPERLIPIDEMIA 08/26/2007   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 07/24/2007   SKIN CANCER, HX OF 07/24/2007   Sleep apnea s/p uvulectomy, palate surgery 07/15/2007    Current Outpatient Medications on File Prior to Visit  Medication Sig Dispense Refill   aspirin 81 MG tablet Take 81 mg by mouth daily.     botulinum toxin Type A (BOTOX)  100 units SOLR injection Botox 100 unit injection  Take 100 units every 3 months by injection route.     cholecalciferol (VITAMIN D3) 25 MCG (1000 UNIT) tablet Take 1,000 Units by mouth daily.     fluticasone (FLONASE) 50 MCG/ACT nasal spray ONE SPRAY IN EACH NOSTRIL TWICE DAILY ASNEEDED -USE CROSSOVER TECHNIQUE DISCUSSED 16 g 0   ibuprofen (ADVIL) 800 MG tablet Take 1 tablet (800 mg total) by mouth 3 (three) times daily. 15 tablet 0   Multiple Vitamins-Minerals (PRESERVISION AREDS 2) CAPS Take 1 capsule by mouth 2 (two) times a day.     pantoprazole (PROTONIX) 40 MG tablet TAKE ONE TABLET DAILY 30 tablet 2   simvastatin (ZOCOR) 40 MG tablet TAKE ONE TABLET AT BEDTIME (Patient taking differently: Take 40 mg by mouth at bedtime.) 90 tablet 3   tamsulosin (FLOMAX) 0.4 MG CAPS capsule Take 0.4 mg by mouth daily.     vardenafil (LEVITRA) 20 MG tablet Take 20 mg by mouth daily as needed for erectile dysfunction.     vitamin B-12 (CYANOCOBALAMIN) 1000 MCG tablet Take 1,000 mcg by mouth daily.     vitamin C (ASCORBIC ACID) 500 MG tablet Take 500 mg by mouth daily.     No current facility-administered medications on file prior to visit.    Past Medical History:  Diagnosis Date   Arthritis    Right shoulder   Diverticulosis 2013   Moderate    First degree AV block 12/09/2018   noted on EKG   GERD (gastroesophageal reflux disease)    Grade I diastolic dysfunction  09/25/2018   Noted on ECHO    Hemifacial spasm    History of loop recorder    Hx of colonic polyp 2008 & 2013   X2   Hyperlipidemia    Internal hemorrhoids 2013   PFO (patent foramen ovale)    History of    Prostate cancer ([place])    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   Sleep apnea    Resolved   Squamous cell skin cancer    Dr.King [place]   Stricture esophagus 2013    dilation X 1   Stroke ([place]) 2016   left semiovale stroke    TIA (transient ischemic attack) 01/2015   Ulcerative esophagitis 2013    Past  Surgical History:  Procedure Laterality Date   COLONOSCOPY W/ POLYPECTOMY  2013, 2018 last done   tubular adenoma   COLONOSCOPY W/ POLYPECTOMY  2008   hyperplastic   CYSTOSCOPY N/A 09/19/2020   Procedure: CYSTOSCOPY FLEXIBLE;  Surgeon: Franchot Gallo, MD;  Location[facility];  Service: Urology;  Laterality: N/A;   EP IMPLANTABLE DEVICE N/A 01/03/2016   Procedure: Loop Recorder Insertion;  Surgeon: Sanda Klein, MD;  Location: [place] [facility];  Service: Cardiovascular;  Laterality: N/A;   MOHS SURGERY     x 3   NASAL SINUS SURGERY  15 yrs ago   PATENT FORAMEN OVALE(PFO) CLOSURE N/A 03/27/2018   Procedure: PATENT FORAMEN OVALE (PFO) CLOSURE;  Surgeon: Sherren Mocha, MD;  Location: [place] [facility];  Service: Cardiovascular;  Laterality: N/A;   PROSTATE BIOPSY  2021   RADIOACTIVE SEED IMPLANT N/A 09/19/2020   Procedure: RADIOACTIVE SEED IMPLANT/BRACHYTHERAPY IMPLANT;  Surgeon: Franchot Gallo, MD;  Location: [facility];  Service: Urology;  Laterality: N/A;  90 MINS   REVERSE SHOULDER ARTHROPLASTY Right 03/10/2019   Procedure: REVERSE SHOULDER ARTHROPLASTY;  Surgeon: Marchia Bond, MD;  Location: [facility];  Service: Orthopedics;  Laterality: Right;   ROTATOR CUFF REPAIR Left yrs ago   SPACE OAR INSTILLATION N/A 09/19/2020   Procedure: SPACE OAR INSTILLATION;  Surgeon: Franchot Gallo, MD;  Location: [facility];  Service: Urology;  Laterality: N/A;   TEE WITHOUT CARDIOVERSION N/A 01/06/2018   Procedure: TRANSESOPHAGEAL ECHOCARDIOGRAM (TEE);  Surgeon: Sanda Klein, MD;  Location: [facility];  Service: Cardiovascular;  Laterality: N/A;   torn biceps tendon Left yrs ago   surgical repair   uvulectomy & tonsillectomy  2008   Dr Wilburn Cornelia    Social History   Socioeconomic History   Marital status: Married    Spouse name: Not on file   Number of children: Not on file   Years of education: Not on file   Highest education  level: Not on file  Occupational History   Occupation: [employer]  Tobacco Use   Smoking status: Former    Packs/day: 0.50    Years: 10.00    Pack years: 5.00    Types: Cigarettes    Quit date: 1983    Years since quitting: 39.8   Smokeless tobacco: Never   Tobacco comments:    smoked 1972-1982, up to 1/2 ppd  Vaping Use   Vaping Use: Never used  Substance and Sexual Activity   Alcohol use: Yes    Alcohol/week: 5.0 - 6.0 standard drinks    Types: 5 - 6 Standard drinks or equivalent per week    Comment:  2 -3 drinks per week   Drug use: No   Sexual activity: Not Currently  Other Topics Concern   Not on file  Social History Narrative   GETS REG EXERCISE 4x weekly   NO DIET   Lives with wife in a 2 story home.  Has 2 children.     Works as a Insurance underwriter.     Education: college   Right Handed   Drinks Caffeine    Lives in a two story home   Social Determinants of Health   Financial Resource Strain: Not on file  Food Insecurity: Not on file  Transportation Needs: Not on file  Physical Activity: Not on file  Stress: Not on file  Social Connections: Not on file    Family History  Problem Relation Age of Onset   Uterine cancer Mother    Breast cancer Mother        Deceased, 62   Stroke Father        > 57   Prostate cancer Father        Deceased, 49   Diabetes Daughter        type 1   Stroke Paternal Uncle        > 67   Heart attack Maternal Grandfather         >55/mid-60s   Hyperlipidemia Son    Healthy Brother    Colon cancer Neg Hx    Rectal cancer Neg Hx    Stomach cancer Neg Hx    Esophageal cancer Neg Hx     Review of Systems  Constitutional:  Negative for chills and fever.  Eyes:  Negative for visual disturbance.  Respiratory:  Negative for cough, shortness of breath and wheezing.   Cardiovascular:  Negative for chest pain, palpitations and leg swelling.  Gastrointestinal:  Negative for abdominal pain, blood in stool,  constipation, diarrhea and nausea.  Genitourinary:  Negative for dysuria.  Musculoskeletal:  Negative for arthralgias and back pain.  Skin:  Negative for rash.  Neurological:  Negative for dizziness, light-headedness, numbness and headaches.  Psychiatric/Behavioral:  Negative for dysphoric mood. The patient is not nervous/anxious.       Objective:   Vitals:   07/26/21 1352  BP: 130/78  Pulse: 69  Temp: 98.1 F (36.7 C)  SpO2: 98%   Filed Weights   07/26/21 1352  Weight: 199 lb (90.3 kg)   Body mass index is 24.87 kg/m.  BP Readings from Last 3 Encounters:  07/26/21 130/78  07/03/21 136/78  12/04/20 131/72    Wt Readings from Last 3 Encounters:  07/26/21 199 lb (90.3 kg)  07/03/21 204 lb (92.5 kg)  10/27/20 196 lb 0.4 oz (88.9 kg)    Depression screen [facility] 2/9 07/26/2021 08/10/2019 08/07/2018 08/06/2017 02/07/2015  Decreased Interest 0 0 0 0 0  Down, Depressed, Hopeless 0 0 0 0 0  PHQ - 2 Score 0 0 0 0 0  Altered sleeping 0 - 0 - -  Tired, decreased energy 0 - 0 - -  Change in appetite 0 - 0 - -  Feeling bad or failure about yourself  0 - 0 - -  Trouble concentrating 0 - 0 - -  Moving slowly or fidgety/restless 0 - 0 - -  Suicidal thoughts 0 - 0 - -  PHQ-9 Score 0 - 0 - -  Some recent data might be hidden    GAD 7 : Generalized Anxiety Score 07/26/2021  Nervous, Anxious, on Edge 0  Control/stop worrying 0  Worry too much - different things 0  Trouble relaxing 0  Restless 0  Easily annoyed or irritable 0  Afraid - awful might happen 0  Total GAD 7 Score 0         Physical Exam Constitutional: He appears well-developed and well-nourished. No distress.  HENT:  Head: Normocephalic and atraumatic.  Right Ear: External ear normal.  Left Ear: External ear normal.  Mouth/Throat: Oropharynx is clear and moist.  Normal ear canals and TM b/l  Eyes: Conjunctivae and EOM are normal.  Neck: Neck supple. No tracheal deviation present. No thyromegaly present.   No carotid bruit  Cardiovascular: Normal rate, regular rhythm, normal heart sounds and intact distal pulses.   No murmur heard. Pulmonary/Chest: Effort normal and breath sounds normal. No respiratory distress. He has no wheezes. He has no rales.  Abdominal: Soft. Ventral hernia present - NT, reducible,  He exhibits no distension. There is no tenderness.  Genitourinary: deferred  Musculoskeletal: He exhibits no edema.  Lymphadenopathy:   He has no cervical adenopathy.  Skin: Skin is warm and dry. He is not diaphoretic.  Psychiatric: He has a normal mood and affect. His behavior is normal.         Assessment & Plan:   Physical exam: Screening blood work  ordered Exercise   regular Weight  normal Substance abuse   none See derm annually  Screened for depression using the PHQ 9 scale.  No evidence of depression.   Screened for anxiety using GAD7 Scale.  No evidence of anxiety.   Reviewed recommended immunizations.  Flu immunization administered today.     Health Maintenance  Topic Date Due   INFLUENZA VACCINE  05/08/2021   COVID-19 Vaccine (4 - Booster for Pfizer series) 05/18/2021   COLONOSCOPY (Pts 45-20yrs Insurance coverage will need to be confirmed)  07/02/2022   TETANUS/TDAP  03/09/2023   Pneumonia Vaccine 20+ Years old  Completed   Hepatitis C Screening  Completed   Zoster Vaccines- Shingrix  Completed   HPV VACCINES  Aged Out     See Problem List for Assessment and Plan of chronic medical problems.

## 2021-07-26 NOTE — Assessment & Plan Note (Signed)
Chronic ?Check B12 level ?

## 2021-07-26 NOTE — Assessment & Plan Note (Signed)
Chronic GERD controlled Continue protonix 40 mg but advised to try every other day to see if he can take less and keep GERD controlled

## 2021-07-26 NOTE — Assessment & Plan Note (Signed)
Chronic Check a1c Low sugar / carb diet Stressed regular exercise  

## 2021-07-26 NOTE — Assessment & Plan Note (Signed)
Chronic Check lipid panel  Continue simvastatin 40 mg daily Regular exercise and healthy diet encouraged  

## 2021-07-26 NOTE — Assessment & Plan Note (Signed)
H/o TIA - embolic from PFO most likely s/p closure Continue ASA 81 mg daily Continue simvastatin 40 mg daily BP well controlled Check lipids

## 2021-07-26 NOTE — Assessment & Plan Note (Signed)
Chronic Management per urology 

## 2021-07-26 NOTE — Assessment & Plan Note (Signed)
Chronic Taking vitamin D daily Check vitamin D level  

## 2021-07-26 NOTE — Patient Instructions (Addendum)
Flu immunization administered today.    Blood work was ordered.     Medications changes include :  none     Please followup in 1 year   Health Maintenance, Male Adopting a healthy lifestyle and getting preventive care are important in promoting health and wellness. Ask your health care provider about: The right schedule for you to have regular tests and exams. Things you can do on your own to prevent diseases and keep yourself healthy. What should I know about diet, weight, and exercise? Eat a healthy diet  Eat a diet that includes plenty of vegetables, fruits, low-fat dairy products, and lean protein. Do not eat a lot of foods that are high in solid fats, added sugars, or sodium. Maintain a healthy weight Body mass index (BMI) is a measurement that can be used to identify possible weight problems. It estimates body fat based on height and weight. Your health care provider can help determine your BMI and help you achieve or maintain a healthy weight. Get regular exercise Get regular exercise. This is one of the most important things you can do for your health. Most adults should: Exercise for at least 150 minutes each week. The exercise should increase your heart rate and make you sweat (moderate-intensity exercise). Do strengthening exercises at least twice a week. This is in addition to the moderate-intensity exercise. Spend less time sitting. Even light physical activity can be beneficial. Watch cholesterol and blood lipids Have your blood tested for lipids and cholesterol at 71 years of age, then have this test every 5 years. You may need to have your cholesterol levels checked more often if: Your lipid or cholesterol levels are high. You are older than 71 years of age. You are at high risk for heart disease. What should I know about cancer screening? Many types of cancers can be detected early and may often be prevented. Depending on your health history and family history,  you may need to have cancer screening at various ages. This may include screening for: Colorectal cancer. Prostate cancer. Skin cancer. Lung cancer. What should I know about heart disease, diabetes, and high blood pressure? Blood pressure and heart disease High blood pressure causes heart disease and increases the risk of stroke. This is more likely to develop in people who have high blood pressure readings, are of African descent, or are overweight. Talk with your health care provider about your target blood pressure readings. Have your blood pressure checked: Every 3-5 years if you are 5-47 years of age. Every year if you are 58 years old or older. If you are between the ages of 61 and 54 and are a current or former smoker, ask your health care provider if you should have a one-time screening for abdominal aortic aneurysm (AAA). Diabetes Have regular diabetes screenings. This checks your fasting blood sugar level. Have the screening done: Once every three years after age 53 if you are at a normal weight and have a low risk for diabetes. More often and at a younger age if you are overweight or have a high risk for diabetes. What should I know about preventing infection? Hepatitis B If you have a higher risk for hepatitis B, you should be screened for this virus. Talk with your health care provider to find out if you are at risk for hepatitis B infection. Hepatitis C Blood testing is recommended for: Everyone born from [birth year] through 1966. Anyone with known risk factors for hepatitis C. Sexually transmitted infections (  STIs) You should be screened each year for STIs, including gonorrhea and chlamydia, if: You are sexually active and are younger than 71 years of age. You are older than 71 years of age and your health care provider tells you that you are at risk for this type of infection. Your sexual activity has changed since you were last screened, and you are at increased risk for  chlamydia or gonorrhea. Ask your health care provider if you are at risk. Ask your health care provider about whether you are at high risk for HIV. Your health care provider may recommend a prescription medicine to help prevent HIV infection. If you choose to take medicine to prevent HIV, you should first get tested for HIV. You should then be tested every 3 months for as long as you are taking the medicine. Follow these instructions at home: Lifestyle Do not use any products that contain nicotine or tobacco, such as cigarettes, e-cigarettes, and chewing tobacco. If you need help quitting, ask your health care provider. Do not use street drugs. Do not share needles. Ask your health care provider for help if you need support or information about quitting drugs. Alcohol use Do not drink alcohol if your health care provider tells you not to drink. If you drink alcohol: Limit how much you have to 0-2 drinks a day. Be aware of how much alcohol is in your drink. In the [place] one drink equals one 12 oz bottle of beer (355 mL), one 5 oz glass of wine (148 mL), or one 1 oz glass of hard liquor (44 mL). General instructions Schedule regular health, dental, and eye exams. Stay current with your vaccines. Tell your health care provider if: You often feel depressed. You have ever been abused or do not feel safe at home. Summary Adopting a healthy lifestyle and getting preventive care are important in promoting health and wellness. Follow your health care provider's instructions about healthy diet, exercising, and getting tested or screened for diseases. Follow your health care provider's instructions on monitoring your cholesterol and blood pressure. This information is not intended to replace advice given to you by your health care provider. Make sure you discuss any questions you have with your health care provider. Document Revised: 12/02/2020 Document Reviewed: 09/17/2018 [employer] Patient Education   2022 [employer].

## 2021-07-27 ENCOUNTER — Emergency Department (HOSPITAL_COMMUNITY): Payer: Commercial Managed Care - PPO

## 2021-07-27 ENCOUNTER — Encounter (HOSPITAL_COMMUNITY): Payer: Self-pay

## 2021-07-27 ENCOUNTER — Other Ambulatory Visit: Payer: Self-pay

## 2021-07-27 ENCOUNTER — Observation Stay (HOSPITAL_COMMUNITY)
Admission: EM | Admit: 2021-07-27 | Discharge: 2021-07-29 | Disposition: A | Discharge status: Home / Self Care | Payer: Commercial Managed Care - PPO | Attending: Internal Medicine | Admitting: Internal Medicine

## 2021-07-27 DIAGNOSIS — Z79899 Other long term (current) drug therapy: Secondary | ICD-10-CM | POA: Diagnosis not present

## 2021-07-27 DIAGNOSIS — I639 Cerebral infarction, unspecified: Secondary | ICD-10-CM | POA: Diagnosis not present

## 2021-07-27 DIAGNOSIS — Z7902 Long term (current) use of antithrombotics/antiplatelets: Secondary | ICD-10-CM | POA: Insufficient documentation

## 2021-07-27 DIAGNOSIS — Z87891 Personal history of nicotine dependence: Secondary | ICD-10-CM | POA: Insufficient documentation

## 2021-07-27 DIAGNOSIS — R29700 NIHSS score 0: Secondary | ICD-10-CM | POA: Diagnosis not present

## 2021-07-27 DIAGNOSIS — Z8616 Personal history of COVID-19: Secondary | ICD-10-CM | POA: Insufficient documentation

## 2021-07-27 DIAGNOSIS — Z9889 Other specified postprocedural states: Secondary | ICD-10-CM | POA: Diagnosis not present

## 2021-07-27 DIAGNOSIS — Z85828 Personal history of other malignant neoplasm of skin: Secondary | ICD-10-CM | POA: Diagnosis not present

## 2021-07-27 DIAGNOSIS — Z7982 Long term (current) use of aspirin: Secondary | ICD-10-CM | POA: Insufficient documentation

## 2021-07-27 DIAGNOSIS — Z8673 Personal history of transient ischemic attack (TIA), and cerebral infarction without residual deficits: Secondary | ICD-10-CM | POA: Insufficient documentation

## 2021-07-27 DIAGNOSIS — I253 Aneurysm of heart: Secondary | ICD-10-CM

## 2021-07-27 DIAGNOSIS — R29810 Facial weakness: Secondary | ICD-10-CM | POA: Diagnosis present

## 2021-07-27 DIAGNOSIS — C61 Malignant neoplasm of prostate: Secondary | ICD-10-CM | POA: Diagnosis present

## 2021-07-27 DIAGNOSIS — U071 COVID-19: Secondary | ICD-10-CM | POA: Insufficient documentation

## 2021-07-27 DIAGNOSIS — G459 Transient cerebral ischemic attack, unspecified: Secondary | ICD-10-CM

## 2021-07-27 DIAGNOSIS — I63512 Cerebral infarction due to unspecified occlusion or stenosis of left middle cerebral artery: Secondary | ICD-10-CM | POA: Diagnosis present

## 2021-07-27 DIAGNOSIS — R7303 Prediabetes: Secondary | ICD-10-CM | POA: Insufficient documentation

## 2021-07-27 DIAGNOSIS — E782 Mixed hyperlipidemia: Secondary | ICD-10-CM | POA: Diagnosis present

## 2021-07-27 DIAGNOSIS — Z8546 Personal history of malignant neoplasm of prostate: Secondary | ICD-10-CM | POA: Diagnosis present

## 2021-07-27 DIAGNOSIS — K219 Gastro-esophageal reflux disease without esophagitis: Secondary | ICD-10-CM | POA: Diagnosis present

## 2021-07-27 DIAGNOSIS — Z96611 Presence of right artificial shoulder joint: Secondary | ICD-10-CM | POA: Diagnosis not present

## 2021-07-27 DIAGNOSIS — I700 Atherosclerosis of aorta: Secondary | ICD-10-CM | POA: Diagnosis not present

## 2021-07-27 LAB — COMPREHENSIVE METABOLIC PANEL
ALT: 39 U/L (ref 0–44)
AST: 30 U/L (ref 15–41)
Albumin: 4.1 g/dL (ref 3.5–5.0)
Alkaline Phosphatase: 74 U/L (ref 38–126)
Anion gap: 11 (ref 5–15)
BUN: 16 mg/dL (ref 8–23)
CO2: 22 mmol/L (ref 22–32)
Calcium: 9.1 mg/dL (ref 8.9–10.3)
Chloride: 105 mmol/L (ref 98–111)
Creatinine, Ser: 0.97 mg/dL (ref 0.61–1.24)
GFR, Estimated: >60 mL/min (ref >60)
Glucose, Bld: 113 mg/dL — ABNORMAL HIGH (ref 70–99)
Potassium: 3.5 mmol/L (ref 3.5–5.1)
Sodium: 138 mmol/L (ref 135–145)
Total Bilirubin: 0.6 mg/dL (ref 0.3–1.2)
Total Protein: 7.2 g/dL (ref 6.5–8.1)

## 2021-07-27 LAB — CBC
HCT: 47.4 % (ref 39.0–52.0)
Hemoglobin: 15.8 g/dL (ref 13.0–17.0)
MCH: 31.6 pg (ref 26.0–34.0)
MCHC: 33.3 g/dL (ref 30.0–36.0)
MCV: 94.8 fL (ref 80.0–100.0)
Platelets: 162 10*3/uL (ref 150–400)
RBC: 5 MIL/uL (ref 4.22–5.81)
RDW: 12.3 % (ref 11.5–15.5)
WBC: 6.1 10*3/uL (ref 4.0–10.5)
nRBC: 0 % (ref 0.0–0.2)

## 2021-07-27 LAB — I-STAT CHEM 8, ED
BUN: 17 mg/dL (ref 8–23)
Calcium, Ion: 1.04 mmol/L — ABNORMAL LOW (ref 1.15–1.40)
Chloride: 105 mmol/L (ref 98–111)
Creatinine, Ser: 1 mg/dL (ref 0.61–1.24)
Glucose, Bld: 110 mg/dL — ABNORMAL HIGH (ref 70–99)
HCT: 46 % (ref 39.0–52.0)
Hemoglobin: 15.6 g/dL (ref 13.0–17.0)
Potassium: 3.5 mmol/L (ref 3.5–5.1)
Sodium: 140 mmol/L (ref 135–145)
TCO2: 23 mmol/L (ref 22–32)

## 2021-07-27 LAB — PROTIME-INR
INR: 1 (ref 0.8–1.2)
Prothrombin Time: 13.4 seconds (ref 11.4–15.2)

## 2021-07-27 LAB — DIFFERENTIAL
Abs Immature Granulocytes: 0.04 10*3/uL (ref 0.00–0.07)
Basophils Absolute: 0 10*3/uL (ref 0.0–0.1)
Basophils Relative: 1 %
Eosinophils Absolute: 0.1 10*3/uL (ref 0.0–0.5)
Eosinophils Relative: 2 %
Immature Granulocytes: 1 %
Lymphocytes Relative: 27 %
Lymphs Abs: 1.7 10*3/uL (ref 0.7–4.0)
Monocytes Absolute: 0.8 10*3/uL (ref 0.1–1.0)
Monocytes Relative: 13 %
Neutro Abs: 3.5 10*3/uL (ref 1.7–7.7)
Neutrophils Relative %: 56 %

## 2021-07-27 LAB — CBG MONITORING, ED: Glucose-Capillary: 117 mg/dL — ABNORMAL HIGH (ref 70–99)

## 2021-07-27 LAB — APTT: aPTT: 35 seconds (ref 24–36)

## 2021-07-27 MED ORDER — ASPIRIN EC 81 MG PO TBEC
243.0000 mg | DELAYED_RELEASE_TABLET | Freq: Once | ORAL | Status: AC
  Administered 2021-07-27: 243 mg via ORAL
  Filled 2021-07-27: qty 3

## 2021-07-27 MED ORDER — SODIUM CHLORIDE 0.9% FLUSH
3.0000 mL | Freq: Once | INTRAVENOUS | Status: AC
  Administered 2021-07-27: 3 mL via INTRAVENOUS

## 2021-07-27 NOTE — ED Provider Notes (Signed)
[facility] EMERGENCY DEPARTMENT Provider Note   CSN: [id number] Arrival date & time: 07/27/21  2020     History Chief Complaint  Patient presents with   Code Stroke    Steve [name] is a 70 y.o. male.  Patient with a history of previous stroke.  Was last normal at 8 in the morning.  Had actually an eye appointment.  And then things developed while there.  Patient began having difficulty getting his words out progressed throughout the day and his family noted him having left facial droop.  They called their outside neurologist who recommended calling EMS.  Family gave him aspirin.  EMS brought him in.  Patient arrived as a code stroke.  Seen by the stroke team to include Dr. Theda Sers.  At that time I saw him his CT head had already been done.  And it had no acute findings.  Patient's neurological symptoms seem to have been resolved.  So making this more TIA.  But based on his story and discussion with Dr. Theda Sers this is most likely acute infarct.      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   Sleep apnea    Resolved   Squamous cell skin cancer    Steve [name]   Stricture esophagus 2013    dilation X 1   Stroke ([place]) 2016   left semiovale stroke    TIA (transient ischemic attack) 01/2015   Ulcerative esophagitis 2013    Patient Active Problem List   Diagnosis Date Noted   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 ([place]) 07/28/2021   COVID-19 11/01/2020    Malignant neoplasm of prostate ([place]) 05/03/2020   Vertigo, benign paroxysmal 12/29/2019   Prediabetes 08/22/2019   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   S/P reverse total shoulder arthroplasty, 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/2019   Difficulty sleeping 02/06/2019   Intermediate stage nonexudative age-related macular degeneration of both eyes 07/30/2018   Hair thinning 08/06/2017   B12 deficiency 08/06/2017   Status post placement of implantable loop recorder 04/24/2016   Squamous cell carcinoma 03/09/2016   Patent foramen ovale with atrial septal aneurysm, s/p closure 6/19 12/08/2015   GERD without esophagitis 11/09/2015   History of TIA (transient ischemic attack) - Dr Posey Pronto 09/30/2015   History of esophageal stricture 04/20/2013   Vitamin D deficiency 06/07/2010   BPH (benign prostatic hyperplasia) - urology 06/07/2010   FIRST DEGREE ATRIOVENTRICULAR BLOCK 02/28/2009   History of colonic polyps 06/18/2008   Mixed hyperlipidemia 08/26/2007   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 07/24/2007   SKIN CANCER, HX OF 07/24/2007   Sleep apnea s/p uvulectomy, palate surgery 07/15/2007    Past Surgical History:  Procedure Laterality Date   COLONOSCOPY W/ POLYPECTOMY  2013, 2018 last done   tubular adenoma   COLONOSCOPY W/ POLYPECTOMY  2008  hyperplastic   CYSTOSCOPY N/A 09/19/2020   Procedure: CYSTOSCOPY FLEXIBLE;  Surgeon: Franchot Gallo, MD;  Location: [facility];  Service: Urology;  Laterality: N/A;   EP IMPLANTABLE DEVICE N/A 01/03/2016   Procedure: Loop Recorder Insertion;  Surgeon: Sanda Klein, MD;  Location: [place] [facility];  Service: Cardiovascular;  Laterality: N/A;   MOHS SURGERY     x 3   NASAL SINUS SURGERY  15 yrs ago   PATENT FORAMEN OVALE(PFO) CLOSURE N/A 03/27/2018   Procedure: PATENT FORAMEN OVALE (PFO) CLOSURE;  Surgeon: Sherren Mocha, MD;  Location: [place] [facility];  Service: Cardiovascular;  Laterality: N/A;   PROSTATE  BIOPSY  2021   RADIOACTIVE SEED IMPLANT N/A 09/19/2020   Procedure: RADIOACTIVE SEED IMPLANT/BRACHYTHERAPY IMPLANT;  Surgeon: Franchot Gallo, MD;  Location: [facility];  Service: Urology;  Laterality: N/A;  90 MINS   REVERSE SHOULDER ARTHROPLASTY Right 03/10/2019   Procedure: REVERSE SHOULDER ARTHROPLASTY;  Surgeon: Marchia Bond, MD;  Location: [facility];  Service: Orthopedics;  Laterality: Right;   ROTATOR CUFF REPAIR Left yrs ago   SPACE OAR INSTILLATION N/A 09/19/2020   Procedure: SPACE OAR INSTILLATION;  Surgeon: Franchot Gallo, MD;  Location: [facility];  Service: Urology;  Laterality: N/A;   TEE WITHOUT CARDIOVERSION N/A 01/06/2018   Procedure: TRANSESOPHAGEAL ECHOCARDIOGRAM (TEE);  Surgeon: Sanda Klein, MD;  Location: [facility];  Service: Cardiovascular;  Laterality: N/A;   torn biceps tendon Left yrs ago   surgical repair   uvulectomy & tonsillectomy  2008   Dr Wilburn Cornelia       Family History  Problem Relation Age of Onset   Uterine cancer Mother    Breast cancer Mother        Deceased, 33   Stroke Father        > 58   Prostate cancer Father        Deceased, 73   Diabetes Daughter        type 1   Stroke Paternal Uncle        > 27   Heart attack Maternal Grandfather         >55/mid-60s   Hyperlipidemia Son    Healthy Brother    Colon cancer Neg Hx    Rectal cancer Neg Hx    Stomach cancer Neg Hx    Esophageal cancer Neg Hx     Social History   Tobacco Use   Smoking status: Former    Packs/day: 0.50    Years: 10.00    Pack years: 5.00    Types: Cigarettes    Quit date: 1983    Years since quitting: 39.8   Smokeless tobacco: Never   Tobacco comments:    smoked 1972-1982, up to 1/2 ppd  Vaping Use   Vaping Use: Never used  Substance Use Topics   Alcohol use: Yes    Alcohol/week: 5.0 - 6.0 standard drinks    Types: 5 - 6 Standard drinks Steve equivalent per week    Comment:  2 -3 drinks per week   Drug use: No     Home Medications Prior to Admission medications   Medication Sig Start Date End Date Taking? Authorizing Provider  aspirin 81 MG tablet Take 81 mg by mouth daily.   Yes [provider]  botulinum toxin Type A (BOTOX) 100 units SOLR injection Inject 100 Units into the muscle every 3 (three) months.   Yes [provider]  cholecalciferol (VITAMIN D3) 25 MCG (  1000 UNIT) tablet Take 1,000 Units by mouth daily.   Yes [provider]  fluticasone (FLONASE) 50 MCG/ACT nasal spray ONE SPRAY IN EACH NOSTRIL TWICE DAILY ASNEEDED -USE CROSSOVER TECHNIQUE DISCUSSED Patient taking differently: Place 1 spray into both nostrils 2 (two) times daily as needed for allergies Steve rhinitis. 06/23/21  Yes Burns, Claudina Lick, MD  Multiple Vitamins-Minerals (PRESERVISION AREDS 2) CAPS Take 1 capsule by mouth 2 (two) times a day.   Yes [provider]  pantoprazole (PROTONIX) 40 MG tablet TAKE ONE TABLET DAILY Patient taking differently: Take 40 mg by mouth daily. 06/07/21  Yes Burns, Claudina Lick, MD  tamsulosin (FLOMAX) 0.4 MG CAPS capsule Take 0.4 mg by mouth daily. 10/13/20  Yes [provider]  vardenafil (LEVITRA) 20 MG tablet Take 20 mg by mouth daily as needed for erectile dysfunction.   Yes [provider]  vitamin B-12 (CYANOCOBALAMIN) 1000 MCG tablet Take 1,000 mcg by mouth daily.   Yes [provider]  vitamin C (ASCORBIC ACID) 500 MG tablet Take 500 mg by mouth daily.   Yes [provider]  clopidogrel (PLAVIX) 75 MG tablet Take 1 tablet (75 mg total) by mouth daily for 20 days. 07/29/21 08/18/21  Charlynne Cousins, MD  simvastatin (ZOCOR) 40 MG tablet Take 1 tablet (40 mg total) by mouth at bedtime. 07/29/21   Charlynne Cousins, MD    Allergies    Pollen extract  Review of Systems   Review of Systems  Constitutional:  Negative for chills and fever.  HENT:  Negative for ear pain and sore throat.   Eyes:  Negative for pain and visual  disturbance.  Respiratory:  Negative for cough and shortness of breath.   Cardiovascular:  Negative for chest pain and palpitations.  Gastrointestinal:  Negative for abdominal pain and vomiting.  Genitourinary:  Negative for dysuria and hematuria.  Musculoskeletal:  Negative for arthralgias and back pain.  Skin:  Negative for color change and rash.  Neurological:  Positive for facial asymmetry and speech difficulty. Negative for seizures and syncope.  All other systems reviewed and are negative.  Physical Exam Updated Vital Signs BP 128/86 (BP Location: Right Arm)   Pulse 72   Temp 98.3 F (36.8 C) (Oral)   Resp 18   Ht 1.905 m (6\' 3" )   Wt 88.5 kg   SpO2 97%   BMI 24.37 kg/m   Physical Exam Vitals and nursing note reviewed.  Constitutional:      Appearance: Normal appearance. He is well-developed.  HENT:     Head: Normocephalic and atraumatic.  Eyes:     Extraocular Movements: Extraocular movements intact.     Conjunctiva/sclera: Conjunctivae normal.     Pupils: Pupils are equal, round, and reactive to light.  Cardiovascular:     Rate and Rhythm: Normal rate and regular rhythm.     Heart sounds: No murmur heard. Pulmonary:     Effort: Pulmonary effort is normal. No respiratory distress.     Breath sounds: Normal breath sounds.  Abdominal:     Palpations: Abdomen is soft.     Tenderness: There is no abdominal tenderness.  Musculoskeletal:        General: Normal range of motion.     Cervical back: Normal range of motion and neck supple.  Skin:    General: Skin is warm and dry.  Neurological:     General: No focal deficit present.     Mental Status: He is alert and oriented  to person, place, and time.     Cranial Nerves: No cranial nerve deficit.     Sensory: No sensory deficit.     Motor: No weakness.     Coordination: Coordination normal.     Comments: It appears that all of the symptoms that were reported earlier in the day have resolved.  No significant focal  findings on neuro exam    ED Results / Procedures / Treatments   Labs (all labs ordered are listed, but only abnormal results are displayed) Labs Reviewed  RESP PANEL BY RT-PCR (FLU A&B, COVID) ARPGX2 - Abnormal; Notable for the following components:      Result Value   SARS Coronavirus 2 by RT PCR POSITIVE (*)    All other components within normal limits  COMPREHENSIVE METABOLIC PANEL - Abnormal; Notable for the following components:   Glucose, Bld 113 (*)    All other components within normal limits  LIPID PANEL - Abnormal; Notable for the following components:   LDL Cholesterol 104 (*)    All other components within normal limits  COMPREHENSIVE METABOLIC PANEL - Abnormal; Notable for the following components:   Glucose, Bld 107 (*)    All other components within normal limits  I-STAT CHEM 8, ED - Abnormal; Notable for the following components:   Glucose, Bld 110 (*)    Calcium, Ion 1.04 (*)    All other components within normal limits  CBG MONITORING, ED - Abnormal; Notable for the following components:   Glucose-Capillary 117 (*)    All other components within normal limits  PROTIME-INR  APTT  CBC  DIFFERENTIAL  HEMOGLOBIN A1C  CBC WITH DIFFERENTIAL/PLATELET  MAGNESIUM    EKG EKG Interpretation  Date/Time:  Thursday July 27 2021 20:37:33 EDT Ventricular Rate:  78 PR Interval:  206 QRS Duration: 91 QT Interval:  365 QTC Calculation: 416 R Axis:   -70 Text Interpretation: Sinus rhythm Left anterior fascicular block Baseline wander in lead(s) V2 V3 V4 Confirmed by Fredia Sorrow [id number]) on 07/27/2021 10:11:39 PM  Radiology MR ANGIO HEAD WO CONTRAST  Result Date: 07/27/2021 CLINICAL DATA:  Stroke code follow-up EXAM: MRI HEAD WITHOUT CONTRAST MRA HEAD WITHOUT CONTRAST MRA NECK WITHOUT CONTRAST TECHNIQUE: Multiplanar, multi-echo pulse sequences of the brain and surrounding structures were acquired without intravenous contrast. Angiographic images of the Circle  of Willis were acquired using MRA technique without intravenous contrast. Angiographic images of the neck were acquired using MRA technique without intravenous contrast. Carotid stenosis measurements (when applicable) are obtained utilizing NASCET criteria, using the distal internal carotid diameter as the denominator. COMPARISON:  03/18/2015 MRI/MRA done correlation is made with same day CT head FINDINGS: MRI HEAD FINDINGS Brain: Multiple areas of restricted diffusion, the largest of which is an area of posterior inferior left frontal cortex (series 2, image 29), additional punctate areas of restricted diffusion are seen in the anterior and posterior left frontal lobe (series 2, images 30-34 and 41) and left parietal lobe (series 2, images 37 and 38). No acute hemorrhage, hydrocephalus, extra-axial collection, mass, mass effect, Steve midline shift. Vascular: See MRA findings, below. Skull and upper cervical spine: Normal marrow signal. Sinuses/Orbits: No acute Steve significant finding. Other: Trace fluid in right mastoid air cells. MRA HEAD FINDINGS Anterior circulation: Both internal carotid arteries are patent to the termini, without stenosis Steve other abnormality. A1 segments patent. Normal anterior communicating artery. Anterior cerebral arteries are patent to their distal aspects. No M1 stenosis Steve occlusion. Normal MCA bifurcations. Distal MCA branches  perfused and symmetric. Posterior circulation: Vertebral arteries widely patent to the vertebrobasilar junction without stenosis. Posterior inferior cerebral arteries patent bilaterally. Basilar patent to its distal aspect. Superior cerebral arteries patent bilaterally. PCAs well perfused to their distal aspects without stenosis. Patent right posterior communicating artery. Anatomic variants: None significant MRA NECK FINDINGS Aortic arch: Not included in the field of view. Right carotid system: No stenosis (greater than 50%), dissection, Steve occlusion in the imaged  right carotid artery. Left carotid system: No stenosis (greater than 50%), dissection, Steve occlusion in the imaged left carotid artery. Vertebral arteries: Right dominant. No stenosis Steve occlusion in the imaged portions. Other: None IMPRESSION: 1. Acute infarcts in the left frontal and parietal lobes, with the largest area involving the left posteroinferior frontal cortex. Multiple punctate infarcts in multiple vascular territories suggest embolic etiology. 2. No intracranial large vessel occlusion. 3. No hemodynamically significant stenosis in the neck. Imaging results were communicated on 07/27/2021 at 11:09 pm to provider [facility] via secure text paging. Electronically Signed   By: Merilyn Baba M.D.   On: 07/27/2021 23:10   MR ANGIO NECK WO CONTRAST  Result Date: 07/27/2021 CLINICAL DATA:  Stroke code follow-up EXAM: MRI HEAD WITHOUT CONTRAST MRA HEAD WITHOUT CONTRAST MRA NECK WITHOUT CONTRAST TECHNIQUE: Multiplanar, multi-echo pulse sequences of the brain and surrounding structures were acquired without intravenous contrast. Angiographic images of the Circle of Willis were acquired using MRA technique without intravenous contrast. Angiographic images of the neck were acquired using MRA technique without intravenous contrast. Carotid stenosis measurements (when applicable) are obtained utilizing NASCET criteria, using the distal internal carotid diameter as the denominator. COMPARISON:  03/18/2015 MRI/MRA done correlation is made with same day CT head FINDINGS: MRI HEAD FINDINGS Brain: Multiple areas of restricted diffusion, the largest of which is an area of posterior inferior left frontal cortex (series 2, image 29), additional punctate areas of restricted diffusion are seen in the anterior and posterior left frontal lobe (series 2, images 30-34 and 41) and left parietal lobe (series 2, images 37 and 38). No acute hemorrhage, hydrocephalus, extra-axial collection, mass, mass effect, Steve midline shift.  Vascular: See MRA findings, below. Skull and upper cervical spine: Normal marrow signal. Sinuses/Orbits: No acute Steve significant finding. Other: Trace fluid in right mastoid air cells. MRA HEAD FINDINGS Anterior circulation: Both internal carotid arteries are patent to the termini, without stenosis Steve other abnormality. A1 segments patent. Normal anterior communicating artery. Anterior cerebral arteries are patent to their distal aspects. No M1 stenosis Steve occlusion. Normal MCA bifurcations. Distal MCA branches perfused and symmetric. Posterior circulation: Vertebral arteries widely patent to the vertebrobasilar junction without stenosis. Posterior inferior cerebral arteries patent bilaterally. Basilar patent to its distal aspect. Superior cerebral arteries patent bilaterally. PCAs well perfused to their distal aspects without stenosis. Patent right posterior communicating artery. Anatomic variants: None significant MRA NECK FINDINGS Aortic arch: Not included in the field of view. Right carotid system: No stenosis (greater than 50%), dissection, Steve occlusion in the imaged right carotid artery. Left carotid system: No stenosis (greater than 50%), dissection, Steve occlusion in the imaged left carotid artery. Vertebral arteries: Right dominant. No stenosis Steve occlusion in the imaged portions. Other: None IMPRESSION: 1. Acute infarcts in the left frontal and parietal lobes, with the largest area involving the left posteroinferior frontal cortex. Multiple punctate infarcts in multiple vascular territories suggest embolic etiology. 2. No intracranial large vessel occlusion. 3. No hemodynamically significant stenosis in the neck. Imaging results were communicated on 07/27/2021 at 11:09  pm to provider [facility] via secure text paging. Electronically Signed   By: Merilyn Baba M.D.   On: 07/27/2021 23:10   MR BRAIN WO CONTRAST  Result Date: 07/27/2021 CLINICAL DATA:  Stroke code follow-up EXAM: MRI HEAD WITHOUT CONTRAST MRA  HEAD WITHOUT CONTRAST MRA NECK WITHOUT CONTRAST TECHNIQUE: Multiplanar, multi-echo pulse sequences of the brain and surrounding structures were acquired without intravenous contrast. Angiographic images of the Circle of Willis were acquired using MRA technique without intravenous contrast. Angiographic images of the neck were acquired using MRA technique without intravenous contrast. Carotid stenosis measurements (when applicable) are obtained utilizing NASCET criteria, using the distal internal carotid diameter as the denominator. COMPARISON:  03/18/2015 MRI/MRA done correlation is made with same day CT head FINDINGS: MRI HEAD FINDINGS Brain: Multiple areas of restricted diffusion, the largest of which is an area of posterior inferior left frontal cortex (series 2, image 29), additional punctate areas of restricted diffusion are seen in the anterior and posterior left frontal lobe (series 2, images 30-34 and 41) and left parietal lobe (series 2, images 37 and 38). No acute hemorrhage, hydrocephalus, extra-axial collection, mass, mass effect, Steve midline shift. Vascular: See MRA findings, below. Skull and upper cervical spine: Normal marrow signal. Sinuses/Orbits: No acute Steve significant finding. Other: Trace fluid in right mastoid air cells. MRA HEAD FINDINGS Anterior circulation: Both internal carotid arteries are patent to the termini, without stenosis Steve other abnormality. A1 segments patent. Normal anterior communicating artery. Anterior cerebral arteries are patent to their distal aspects. No M1 stenosis Steve occlusion. Normal MCA bifurcations. Distal MCA branches perfused and symmetric. Posterior circulation: Vertebral arteries widely patent to the vertebrobasilar junction without stenosis. Posterior inferior cerebral arteries patent bilaterally. Basilar patent to its distal aspect. Superior cerebral arteries patent bilaterally. PCAs well perfused to their distal aspects without stenosis. Patent right posterior  communicating artery. Anatomic variants: None significant MRA NECK FINDINGS Aortic arch: Not included in the field of view. Right carotid system: No stenosis (greater than 50%), dissection, Steve occlusion in the imaged right carotid artery. Left carotid system: No stenosis (greater than 50%), dissection, Steve occlusion in the imaged left carotid artery. Vertebral arteries: Right dominant. No stenosis Steve occlusion in the imaged portions. Other: None IMPRESSION: 1. Acute infarcts in the left frontal and parietal lobes, with the largest area involving the left posteroinferior frontal cortex. Multiple punctate infarcts in multiple vascular territories suggest embolic etiology. 2. No intracranial large vessel occlusion. 3. No hemodynamically significant stenosis in the neck. Imaging results were communicated on 07/27/2021 at 11:09 pm to provider [facility] via secure text paging. Electronically Signed   By: Merilyn Baba M.D.   On: 07/27/2021 23:10   CT CHEST ABDOMEN PELVIS W CONTRAST  Result Date: 07/28/2021 CLINICAL DATA:  Prostate cancer, diverticulitis, embolic stroke EXAM: CT CHEST, ABDOMEN, AND PELVIS WITH CONTRAST TECHNIQUE: Multidetector CT imaging of the chest, abdomen and pelvis was performed following the standard protocol during bolus administration of intravenous contrast. CONTRAST:  133mL OMNIPAQUE IOHEXOL 350 MG/ML SOLN COMPARISON:  01/25/2021 FINDINGS: CT CHEST FINDINGS Cardiovascular: ASD occlusion device identified within the interatrial septum. Heart is otherwise unremarkable without pericardial effusion. No evidence of thoracic aortic aneurysm Steve dissection. While not optimized for evaluation of the pulmonary vasculature, I do not identify any filling defects Steve pulmonary emboli on this exam. Mediastinum/Nodes: No enlarged mediastinal, hilar, Steve axillary lymph nodes. Thyroid gland, trachea, and esophagus demonstrate no significant findings. Small hiatal hernia. Lungs/Pleura: No acute airspace disease,  effusion, Steve pneumothorax. Central  airways are patent. Musculoskeletal: No acute Steve destructive bony lesions. Right shoulder arthroplasty is identified. Reconstructed images demonstrate no additional findings. CT ABDOMEN PELVIS FINDINGS Hepatobiliary: Mild diffuse hepatic steatosis. No focal liver abnormality. No intrahepatic duct dilation. Gallbladder is unremarkable. Pancreas: Unremarkable. No pancreatic ductal dilatation Steve surrounding inflammatory changes. Spleen: Normal in size without focal abnormality. Adrenals/Urinary Tract: Adrenal glands are unremarkable. Kidneys are normal, without renal calculi, focal lesion, Steve hydronephrosis. Bladder is unremarkable. Stomach/Bowel: No bowel obstruction Steve ileus. Normal appendix right lower quadrant. Diverticulosis of the descending and sigmoid colon without diverticulitis. No bowel wall thickening Steve inflammatory change. Vascular/Lymphatic: Aortic atherosclerosis. No enlarged abdominal Steve pelvic lymph nodes. Reproductive: Brachytherapy seeds are seen within the prostate bed, stable. No focal abnormality. Other: No free fluid Steve free intraperitoneal gas. No abdominal wall hernia. Musculoskeletal: No acute Steve destructive bony lesions. Reconstructed images demonstrate no additional findings. IMPRESSION: 1. No acute intrathoracic, intra-abdominal, Steve intrapelvic process. 2. Small hiatal hernia. 3. Hepatic steatosis. 4. Distal colonic diverticulosis without diverticulitis. 5.  Aortic Atherosclerosis (ICD10-I70.0). 6. Amplatzer occlusion device within the interatrial septum. Electronically Signed   By: Randa Ngo M.D.   On: 07/28/2021 19:56   VAS [place] TRANSCRANIAL DOPPLER W BUBBLES  Result Date: 07/29/2021  Transcranial Doppler with Bubble Patient Name:  Steve [name]  Date of Exam:   07/28/2021 Medical Rec #: [id number]           Accession #:    [id number] Date of Birth: [birth year]            Patient Gender: M Patient Age:   56 years Exam Location:  [facility] Procedure:      VAS [place] TRANSCRANIAL DOPPLER W BUBBLES Referring Phys: Cornelius Moras XU --------------------------------------------------------------------------------  Indications: Embolic stroke, s/p PFO closure. History: 03-27-2018 Patent foramen ovale closure. 03-27-2018 Limited echo: The device appears well-seated, no evidence for flow across the device. 05-12-2019 Echo complete: PFO closure device in place. No obvious shunting by color doppler.  Performing Technologist: Darlin Coco RDMS, RVT  Examination Guidelines: A complete evaluation includes B-mode imaging, spectral Doppler, color Doppler, and power Doppler as needed of all accessible portions of each vessel. Bilateral testing is considered an integral part of a complete examination. Limited examinations for reoccurring indications may be performed as noted.  Summary: No HITS at rest Steve during Valsalva. Negative transcranial Doppler Bubble study with no evidence of right to left intracardiac communication.  A vascular evaluation was performed. The right carotid siphon was studied. An IV was inserted into the patient's left antecubital. Verbal informed consent was obtained.  NEGATIVE TCD Bubble study. *See table(s) above for TCD measurements and observations.  Diagnosing physician: Antony Contras MD Electronically signed by Antony Contras MD on 07/29/2021 at 1:32:37 PM.    Final    ECHOCARDIOGRAM COMPLETE  Result Date: 07/28/2021    ECHOCARDIOGRAM REPORT   Patient Name:   Steve [name] Date of Exam: 07/28/2021 Medical Rec #:  [id number]          Height:       75.0 in Accession #:    [id number]         Weight:       199.0 lb Date of Birth:  07/23/1951           BSA:          2.190 m Patient Age:    29 years           BP:  131/82 mmHg Patient Gender: M                  HR:           74 bpm. Exam Location:  Inpatient Procedure: 2D Echo                              MODIFIED REPORT: This report was modified by Kirk Ruths MD on 07/28/2021  due to Change.  Indications:     stroke  History:         Patient has prior history of Echocardiogram examinations, most                  recent 05/12/2019. Risk Factors:Dyslipidemia. Septal Repair:PFO                  closure on 03/27/18.  Sonographer:     [name] RDCS Referring Phys:  [id number] [place] Diagnosing Phys: Kirk Ruths MD IMPRESSIONS  1. PFO closure device noted; no obvious residual shunt.  2. Left ventricular ejection fraction, by estimation, is 60 to 65%. The left ventricle has normal function. The left ventricle has no regional wall motion abnormalities. Left ventricular diastolic parameters are consistent with Grade I diastolic dysfunction (impaired relaxation).  3. Right ventricular systolic function is normal. The right ventricular size is normal.  4. The mitral valve is normal in structure. Trivial mitral valve regurgitation. No evidence of mitral stenosis.  5. The aortic valve is normal in structure. Aortic valve regurgitation is not visualized. No aortic stenosis is present.  6. The inferior vena cava is normal in size with greater than 50% respiratory variability, suggesting right atrial pressure of 3 mmHg. FINDINGS  Left Ventricle: Left ventricular ejection fraction, by estimation, is 60 to 65%. The left ventricle has normal function. The left ventricle has no regional wall motion abnormalities. The left ventricular internal cavity size was normal in size. There is  no left ventricular hypertrophy. Left ventricular diastolic parameters are consistent with Grade I diastolic dysfunction (impaired relaxation). Right Ventricle: The right ventricular size is normal. Right ventricular systolic function is normal. Left Atrium: Left atrial size was normal in size. Right Atrium: Right atrial size was normal in size. Pericardium: There is no evidence of pericardial effusion. Mitral Valve: The mitral valve is normal in structure. Trivial mitral valve regurgitation. No evidence of  mitral valve stenosis. Tricuspid Valve: The tricuspid valve is normal in structure. Tricuspid valve regurgitation is trivial. No evidence of tricuspid stenosis. Aortic Valve: The aortic valve is normal in structure. Aortic valve regurgitation is not visualized. No aortic stenosis is present. Pulmonic Valve: The pulmonic valve was normal in structure. Pulmonic valve regurgitation is not visualized. No evidence of pulmonic stenosis. Aorta: The aortic root is normal in size and structure. Venous: The inferior vena cava is normal in size with greater than 50% respiratory variability, suggesting right atrial pressure of 3 mmHg. IAS/Shunts: No atrial level shunt detected by color flow Doppler. Additional Comments: PFO closure device noted; no obvious residual shunt.  LEFT VENTRICLE PLAX 2D LVIDd:         3.80 cm Diastology LVIDs:         2.60 cm LV e' medial:    6.31 cm/s LV PW:         1.10 cm LV E/e' medial:  9.9 LV IVS:        1.10 cm LV e' lateral:  9.57 cm/s                        LV E/e' lateral: 6.5  RIGHT VENTRICLE             IVC RV S prime:     12.80 cm/s  IVC diam: 1.70 cm TAPSE (M-mode): 2.1 cm LEFT ATRIUM             Index        RIGHT ATRIUM           Index LA diam:        3.40 cm 1.55 cm/m   RA Area:     14.60 cm LA Vol (A2C):   50.0 ml 22.83 ml/m  RA Volume:   35.50 ml  16.21 ml/m LA Vol (A4C):   47.5 ml 21.69 ml/m LA Biplane Vol: 48.9 ml 22.33 ml/m  AORTIC VALVE LVOT Vmax:   98.50 cm/s LVOT Vmean:  65.600 cm/s LVOT VTI:    0.189 m  AORTA Ao Asc diam: 3.20 cm MITRAL VALVE MV Area (PHT): 2.99 cm    SHUNTS MV Decel Time: 254 msec    Systemic VTI: 0.19 m MV E velocity: 62.60 cm/s MV A velocity: 84.40 cm/s MV E/A ratio:  0.74 Kirk Ruths MD Electronically signed by Kirk Ruths MD Signature Date/Time: 07/28/2021/6:10:16 PM    Final (Updated)    CT HEAD CODE STROKE WO CONTRAST  Result Date: 07/27/2021 CLINICAL DATA:  Code stroke. EXAM: CT HEAD WITHOUT CONTRAST TECHNIQUE: Contiguous axial  images were obtained from the base of the skull through the vertex without intravenous contrast. COMPARISON:  None. FINDINGS: Brain: There is no acute intracranial hemorrhage, mass effect, Steve edema. Gray-white differentiation is preserved. Ventricles and sulci are normal in size and configuration. No extra-axial collection. Vascular: No definite hyperdense vessel. Mild intracranial atherosclerotic calcification at the skull base. Skull: Unremarkable. Sinuses/Orbits: Aerated.  Orbits are unremarkable. Other: Mastoid air cells are clear. ASPECTS ([place] Stroke Program Early CT Score) - Ganglionic level infarction (caudate, lentiform nuclei, internal capsule, insula, M1-M3 cortex): 7 - Supraganglionic infarction (M4-M6 cortex): 3 Total score (0-10 with 10 being normal): 10 IMPRESSION: There is no acute intracranial hemorrhage Steve evidence of acute infarction. ASPECT score is 10. These results were communicated to Dr. Theda Sers at 8:40 pm on 07/27/2021 by text page via the [facility] messaging system. Electronically Signed   By: Macy Mis M.D.   On: 07/27/2021 20:41    Procedures Procedures   Medications Ordered in ED Medications  pantoprazole (PROTONIX) EC tablet 40 mg (40 mg Oral Given 07/29/21 1043)  tamsulosin (FLOMAX) capsule 0.4 mg (0.4 mg Oral Patient Refused/Not Given 07/29/21 1042)  fluticasone (FLONASE) 50 MCG/ACT nasal spray 1 spray (has no administration in time range)  enoxaparin (LOVENOX) injection 40 mg (40 mg Subcutaneous Given 07/29/21 1043)  acetaminophen (TYLENOL) tablet 650 mg (has no administration in time range)    Steve  acetaminophen (TYLENOL) suppository 650 mg (has no administration in time range)  polyethylene glycol (MIRALAX / GLYCOLAX) packet 17 g (has no administration in time range)  ondansetron (ZOFRAN) tablet 4 mg (has no administration in time range)    Steve  ondansetron (ZOFRAN) injection 4 mg (has no administration in time range)  hydrALAZINE (APRESOLINE) injection 10 mg  (has no administration in time range)  aspirin EC tablet 81 mg (81 mg Oral Given 07/29/21 1043)  clopidogrel (PLAVIX) tablet 75 mg (75 mg Oral Given 07/29/21 1043)  atorvastatin (  LIPITOR) tablet 40 mg (40 mg Oral Given 07/29/21 1043)  sodium chloride flush (NS) 0.9 % injection 3 mL (3 mLs Intravenous Given 07/27/21 2112)  aspirin EC tablet 243 mg (243 mg Oral Given 07/27/21 2112)   stroke: mapping our early stages of recovery book ( Does not apply Given 07/28/21 0627)  iohexol (OMNIPAQUE) 350 MG/ML injection 100 mL (100 mLs Intravenous Contrast Given 07/28/21 1858)    ED Course  I have reviewed the triage vital signs and the nursing notes.  Pertinent labs & imaging results that were available during my care of the patient were reviewed by me and considered in my medical decision making (see chart for details).    MDM Rules/Calculators/A&P                         CRITICAL CARE Performed by: Fredia Sorrow Total critical care time: 45 minutes Critical care time was exclusive of separately billable procedures and treating other patients. Critical care was necessary to treat Steve prevent imminent Steve life-threatening deterioration. Critical care was time spent personally by me on the following activities: development of treatment plan with patient and/Steve surrogate as well as nursing, discussions with consultants, evaluation of patient's response to treatment, examination of patient, obtaining history from patient Steve surrogate, ordering and performing treatments and interventions, ordering and review of laboratory studies, ordering and review of radiographic studies, pulse oximetry and re-evaluation of patient's condition.  Patient arrived as a code stroke.  Seen by stroke team Dr. Theda Sers.  Initial concerns were for left facial droop and slurred speech.  Patient was last normal at about 8 in the morning.  He began having difficulty getting his words out.  This progressed throughout the day and his  family noted that he had left facial droop.  Upon arrival here his symptoms seem to have resolved if not completely.  Patient has a history of previous TIA being followed by outside neurologist.  Neurology felt that likely acute ischemic stroke.  He arrived outside the window for tPA.  Not a candidate for thrombolytics.  Neurology recommended admission for additional work-up hospitalist contacted for admission.  MRI/MRA is ordered  Final Clinical Impression(s) / ED Diagnoses Final diagnoses:  TIA (transient ischemic attack)    Rx / DC Orders ED Discharge Orders          Ordered    clopidogrel (PLAVIX) 75 MG tablet  Daily        07/29/21 0920    simvastatin (ZOCOR) 40 MG tablet  Daily at bedtime        07/29/21 0920    Increase activity slowly        07/29/21 0920    Diet - low sodium heart healthy        07/29/21 0920    Ambulatory referral to Neurology       Comments: Follow up with Dr. Posey Pronto at [facility] in 4-6 weeks.  Patient is Steve [name] patient.  Thanks.   07/28/21 1945             Fredia Sorrow, MD 07/29/21 [phone]

## 2021-07-27 NOTE — ED Triage Notes (Addendum)
Pt arrives EMS from home with c/o L sided facial droop and aphasia beginning at 0800 this morning. NIH-0. Alert, oriented and ambulatory.

## 2021-07-27 NOTE — ED Notes (Signed)
Patient transported to CT 

## 2021-07-27 NOTE — Consult Note (Addendum)
Neurology Consult H&P  Steve [name] MR# [id number] 07/27/2021   CC: L facial droop and slurred speech  History is obtained from: patient and chart.  HPI: Steve [name] is a 70 y.o. male PMHx as reviewed below last normal 0800 when he began to have difficulty getting his words out.  This progressed throughout the day and his family noted him having left facial droop.  The family called outside neurologist who recommended calling EMS.  Family gave him an aspirin.  Denies fever headache chills nausea vomiting shortness of breath chest pain abdominal pain.  LKW: 0800 tNK given: No to mild/outside window IR Thrombectomy No Modified Rankin Scale: 0-Completely asymptomatic and back to baseline post- stroke NIHSS: 0  ROS: A complete ROS was performed and is negative except as noted in the HPI.  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   Sleep apnea    Resolved   Squamous cell skin cancer    Dr.King [place]   Stricture esophagus 2013    dilation X 1   Stroke ([place]) 2016   left semiovale stroke    TIA (transient ischemic attack) 01/2015   Ulcerative esophagitis 2013     Family History  Problem Relation Age of Onset   Uterine cancer Mother    Breast cancer Mother        Deceased, 65   Stroke Father        > 55   Prostate cancer Father        Deceased, 12   Diabetes Daughter        type 1   Stroke Paternal Uncle        > 36   Heart attack Maternal Grandfather         >55/mid-60s    Hyperlipidemia Son    Healthy Brother    Colon cancer Neg Hx    Rectal cancer Neg Hx    Stomach cancer Neg Hx    Esophageal cancer Neg Hx     Social History:  reports that he quit smoking about 39 years ago. His smoking use included cigarettes. He has a 5.00 pack-year smoking history. He has never used smokeless tobacco. He reports current alcohol use of about 5.0 - 6.0 standard drinks per week. He reports that he does not use drugs.   Prior to Admission medications   Medication Sig Start Date End Date Taking? Authorizing Provider  aspirin 81 MG tablet Take 81 mg by mouth daily.    [provider]  botulinum toxin Type A (BOTOX) 100 units SOLR injection Botox 100 unit injection  Take 100 units every 3 months by injection route.    [provider]  cholecalciferol (VITAMIN D3) 25 MCG (1000 UNIT) tablet Take 1,000 Units by mouth daily.    [provider]  fluticasone (FLONASE) 50 MCG/ACT nasal spray ONE SPRAY IN EACH NOSTRIL TWICE  DAILY ASNEEDED -USE CROSSOVER TECHNIQUE DISCUSSED 06/23/21   Binnie Rail, MD  ibuprofen (ADVIL) 800 MG tablet Take 1 tablet (800 mg total) by mouth 3 (three) times daily. 12/04/20   Fransico Meadow, PA-C  Multiple Vitamins-Minerals (PRESERVISION AREDS 2) CAPS Take 1 capsule by mouth 2 (two) times a day.    [provider]  pantoprazole (PROTONIX) 40 MG tablet TAKE ONE TABLET DAILY 06/07/21   Binnie Rail, MD  simvastatin (ZOCOR) 40 MG tablet TAKE ONE TABLET AT BEDTIME Patient taking differently: Take 40 mg by mouth at bedtime. 11/03/20   Binnie Rail, MD  tamsulosin (FLOMAX) 0.4 MG CAPS capsule Take 0.4 mg by mouth daily. 10/13/20   [provider]  vardenafil (LEVITRA) 20 MG tablet Take 20 mg by mouth daily as needed for erectile dysfunction.    [provider]  vitamin B-12 (CYANOCOBALAMIN) 1000 MCG tablet Take 1,000 mcg by mouth daily.    [provider]  vitamin C (ASCORBIC ACID) 500 MG tablet Take  500 mg by mouth daily.    [provider]    Exam: Current vital signs: There were no vitals taken for this visit.  Physical Exam  Constitutional: Appears well-developed and well-nourished.  Psych: Affect appropriate to situation Eyes: No scleral injection HENT: No OP obstruction. Head: Normocephalic.  Cardiovascular: Normal rate and regular rhythm.  Respiratory: Effort normal, symmetric excursions bilaterally, no audible wheezing. GI: Soft.  No distension. There is no tenderness.  Skin: WDI  Neuro: Mental Status: Patient is awake, alert, oriented to person, place, month, year, and situation. Patient is able to give a clear and coherent history. Speech  fluent, intact comprehension and repetition. No signs of aphasia or neglect. Visual Fields are full. Pupils are equal, round, and reactive to light. EOMI without ptosis or diploplia.  Facial sensation is symmetric to temperature Facial movement is symmetric.  Hearing is intact to voice. Uvula midline and palate elevates symmetrically. Shoulder shrug is symmetric. Tongue is midline without atrophy or fasciculations.  Tone is normal. Bulk is normal. 5/5 strength was present in all four extremities. Sensation is symmetric to light touch and temperature in the arms and legs. Deep Tendon Reflexes: 2+ and symmetric in the biceps and patellae. Toes are downgoing bilaterally. FNF and HKS are intact bilaterally. Gait - Deferred  I have reviewed labs in epic and the pertinent results are: There were no labs available at time of evaluation  I have reviewed the images obtained: NCT head showed no acute ischemic changes, hemorrhage, mass lesion.  Assessment: Steve [name] is a 70 y.o. male PMHx as above, previous TIA being followed by outside neurologist now with likely acute ischemic stroke. He arrived outside the window and is currently back to baseline therefore, not a candidate for thrombolytics.  Patient has a  history of previous TIA and will need further evaluation of vascular risk factors.  He does have a ILR which did not capture events however battery has expired. Recommended aspirin 243mg  now.   Plan: - MRI brain without contrast - Ordered. - Recommend vascular imaging with MRA head and neck - Ordered. - Recommend TTE. - Recommend labs: HbA1c, lipid panel, TSH. - Recommend Statin if LDL > 70 - Aspirin 81mg  daily. - Clopidogrel 75mg  daily for 3 weeks. - Permissive hypertension first 24 h < 220/110.  - Telemetry monitoring for arrhythmia. - Recommend bedside Swallow screen. - Recommend Stroke education. - Recommend PT/OT/SLP consult.  This patient is critically ill and  at significant risk of neurological worsening, death and care requires constant monitoring of vital signs, hemodynamics,respiratory and cardiac monitoring, neurological assessment, discussion with family, other specialists and medical decision making of high complexity. I spent 73 minutes of neurocritical care time  in the care of  this patient. This was time spent independent of any time provided by nurse practitioner or PA.  Electronically signed by:  Lynnae Sandhoff, MD Page: [phone] 07/27/2021, 8:42 PM

## 2021-07-27 NOTE — ED Notes (Signed)
Pt arrives back to treatment room from MRI.

## 2021-07-27 NOTE — ED Notes (Signed)
Patient transported to MRI 

## 2021-07-27 NOTE — Code Documentation (Signed)
Responded to Code Stroke called at 2005 for aphasia and L facial droop, LSN-0800. Per EMS/family, pt was normal at 0800. He then began to have difficulty getting his words out. This worsened t/o day and then he developed a L facial droop.Pt arrived at 2020. CBG-117, NIH-0, CTH negative for acute changes. Plan ASA/MRI/TIA alert.

## 2021-07-28 ENCOUNTER — Observation Stay (HOSPITAL_BASED_OUTPATIENT_CLINIC_OR_DEPARTMENT_OTHER): Payer: Commercial Managed Care - PPO

## 2021-07-28 ENCOUNTER — Observation Stay (HOSPITAL_COMMUNITY): Payer: Commercial Managed Care - PPO

## 2021-07-28 ENCOUNTER — Encounter (HOSPITAL_COMMUNITY): Payer: Self-pay | Admitting: Internal Medicine

## 2021-07-28 ENCOUNTER — Telehealth: Payer: Self-pay | Admitting: Neurology

## 2021-07-28 DIAGNOSIS — C61 Malignant neoplasm of prostate: Secondary | ICD-10-CM | POA: Diagnosis not present

## 2021-07-28 DIAGNOSIS — I639 Cerebral infarction, unspecified: Secondary | ICD-10-CM

## 2021-07-28 DIAGNOSIS — K219 Gastro-esophageal reflux disease without esophagitis: Secondary | ICD-10-CM

## 2021-07-28 DIAGNOSIS — I63512 Cerebral infarction due to unspecified occlusion or stenosis of left middle cerebral artery: Secondary | ICD-10-CM

## 2021-07-28 DIAGNOSIS — I63412 Cerebral infarction due to embolism of left middle cerebral artery: Secondary | ICD-10-CM | POA: Diagnosis not present

## 2021-07-28 DIAGNOSIS — E782 Mixed hyperlipidemia: Secondary | ICD-10-CM | POA: Diagnosis not present

## 2021-07-28 DIAGNOSIS — Q2112 Patent foramen ovale: Secondary | ICD-10-CM | POA: Diagnosis not present

## 2021-07-28 DIAGNOSIS — I253 Aneurysm of heart: Secondary | ICD-10-CM

## 2021-07-28 LAB — LIPID PANEL
Cholesterol: 166 mg/dL (ref 0–200)
HDL: 47 mg/dL (ref >40)
LDL Cholesterol: 104 mg/dL — ABNORMAL HIGH (ref 0–99)
Total CHOL/HDL Ratio: 3.5 RATIO
Triglycerides: 74 mg/dL (ref <150)
VLDL: 15 mg/dL (ref 0–40)

## 2021-07-28 LAB — CBC WITH DIFFERENTIAL/PLATELET
Abs Immature Granulocytes: 0.04 10*3/uL (ref 0.00–0.07)
Basophils Absolute: 0 10*3/uL (ref 0.0–0.1)
Basophils Relative: 1 %
Eosinophils Absolute: 0.1 10*3/uL (ref 0.0–0.5)
Eosinophils Relative: 2 %
HCT: 43.6 % (ref 39.0–52.0)
Hemoglobin: 14.9 g/dL (ref 13.0–17.0)
Immature Granulocytes: 1 %
Lymphocytes Relative: 28 %
Lymphs Abs: 1.5 10*3/uL (ref 0.7–4.0)
MCH: 31.6 pg (ref 26.0–34.0)
MCHC: 34.2 g/dL (ref 30.0–36.0)
MCV: 92.4 fL (ref 80.0–100.0)
Monocytes Absolute: 0.7 10*3/uL (ref 0.1–1.0)
Monocytes Relative: 13 %
Neutro Abs: 3.1 10*3/uL (ref 1.7–7.7)
Neutrophils Relative %: 55 %
Platelets: 166 10*3/uL (ref 150–400)
RBC: 4.72 MIL/uL (ref 4.22–5.81)
RDW: 12.1 % (ref 11.5–15.5)
WBC: 5.4 10*3/uL (ref 4.0–10.5)
nRBC: 0 % (ref 0.0–0.2)

## 2021-07-28 LAB — COMPREHENSIVE METABOLIC PANEL
ALT: 36 U/L (ref 0–44)
AST: 30 U/L (ref 15–41)
Albumin: 3.7 g/dL (ref 3.5–5.0)
Alkaline Phosphatase: 62 U/L (ref 38–126)
Anion gap: 5 (ref 5–15)
BUN: 14 mg/dL (ref 8–23)
CO2: 28 mmol/L (ref 22–32)
Calcium: 8.9 mg/dL (ref 8.9–10.3)
Chloride: 106 mmol/L (ref 98–111)
Creatinine, Ser: 1.06 mg/dL (ref 0.61–1.24)
GFR, Estimated: >60 mL/min (ref >60)
Glucose, Bld: 107 mg/dL — ABNORMAL HIGH (ref 70–99)
Potassium: 4.5 mmol/L (ref 3.5–5.1)
Sodium: 139 mmol/L (ref 135–145)
Total Bilirubin: 0.6 mg/dL (ref 0.3–1.2)
Total Protein: 6.7 g/dL (ref 6.5–8.1)

## 2021-07-28 LAB — RESP PANEL BY RT-PCR (FLU A&B, COVID) ARPGX2
Influenza A by PCR: NEGATIVE
Influenza B by PCR: NEGATIVE
SARS Coronavirus 2 by RT PCR: POSITIVE — AB

## 2021-07-28 LAB — MAGNESIUM: Magnesium: 2.3 mg/dL (ref 1.7–2.4)

## 2021-07-28 LAB — ECHOCARDIOGRAM COMPLETE
Area-P 1/2: 2.99 cm2
Height: 75 in
S' Lateral: 2.6 cm
Weight: 3120 oz

## 2021-07-28 LAB — HEMOGLOBIN A1C
Hgb A1c MFr Bld: 5.6 % (ref 4.8–5.6)
Mean Plasma Glucose: 114.02 mg/dL

## 2021-07-28 MED ORDER — ONDANSETRON HCL 4 MG PO TABS: 4.0000 mg | ORAL_TABLET | Freq: Four times a day (QID) | ORAL | Status: DC | PRN

## 2021-07-28 MED ORDER — POLYETHYLENE GLYCOL 3350 17 G PO PACK: 17.0000 g | PACK | Freq: Every day | ORAL | Status: DC | PRN

## 2021-07-28 MED ORDER — CLOPIDOGREL BISULFATE 75 MG PO TABS
75.0000 mg | ORAL_TABLET | Freq: Every day | ORAL | Status: DC
  Administered 2021-07-28 – 2021-07-29 (×2): 75 mg via ORAL
  Filled 2021-07-28 (×2): qty 1

## 2021-07-28 MED ORDER — ACETAMINOPHEN 325 MG PO TABS: 650.0000 mg | ORAL_TABLET | Freq: Four times a day (QID) | ORAL | Status: DC | PRN

## 2021-07-28 MED ORDER — ENOXAPARIN SODIUM 40 MG/0.4ML IJ SOSY
40.0000 mg | PREFILLED_SYRINGE | Freq: Every day | INTRAMUSCULAR | Status: DC
  Administered 2021-07-28 – 2021-07-29 (×2): 40 mg via SUBCUTANEOUS
  Filled 2021-07-28 (×2): qty 0.4

## 2021-07-28 MED ORDER — IOHEXOL 350 MG/ML SOLN
100.0000 mL | Freq: Once | INTRAVENOUS | Status: AC | PRN
  Administered 2021-07-28: 100 mL via INTRAVENOUS

## 2021-07-28 MED ORDER — ACETAMINOPHEN 650 MG RE SUPP: 650.0000 mg | Freq: Four times a day (QID) | RECTAL | Status: DC | PRN

## 2021-07-28 MED ORDER — ASPIRIN EC 81 MG PO TBEC
81.0000 mg | DELAYED_RELEASE_TABLET | Freq: Every day | ORAL | Status: DC
  Administered 2021-07-28 – 2021-07-29 (×2): 81 mg via ORAL
  Filled 2021-07-28 (×2): qty 1

## 2021-07-28 MED ORDER — TAMSULOSIN HCL 0.4 MG PO CAPS
0.4000 mg | ORAL_CAPSULE | Freq: Every day | ORAL | Status: DC
  Filled 2021-07-28: qty 1

## 2021-07-28 MED ORDER — SIMVASTATIN 20 MG PO TABS: 40.0000 mg | ORAL_TABLET | Freq: Every day | ORAL | Status: DC

## 2021-07-28 MED ORDER — ATORVASTATIN CALCIUM 40 MG PO TABS
40.0000 mg | ORAL_TABLET | Freq: Every day | ORAL | Status: DC
  Administered 2021-07-28 – 2021-07-29 (×2): 40 mg via ORAL
  Filled 2021-07-28 (×2): qty 1

## 2021-07-28 MED ORDER — ONDANSETRON HCL 4 MG/2ML IJ SOLN: 4.0000 mg | Freq: Four times a day (QID) | INTRAMUSCULAR | Status: DC | PRN

## 2021-07-28 MED ORDER — HYDRALAZINE HCL 20 MG/ML IJ SOLN: 10.0000 mg | Freq: Four times a day (QID) | INTRAMUSCULAR | Status: DC | PRN

## 2021-07-28 MED ORDER — STROKE: EARLY STAGES OF RECOVERY BOOK
Freq: Once | Status: AC
  Filled 2021-07-28: qty 1

## 2021-07-28 MED ORDER — FLUTICASONE PROPIONATE 50 MCG/ACT NA SUSP: 1.0000 | Freq: Two times a day (BID) | NASAL | Status: DC | PRN

## 2021-07-28 MED ORDER — PANTOPRAZOLE SODIUM 40 MG PO TBEC
40.0000 mg | DELAYED_RELEASE_TABLET | Freq: Every day | ORAL | Status: DC
  Administered 2021-07-28 – 2021-07-29 (×2): 40 mg via ORAL
  Filled 2021-07-28 (×2): qty 1

## 2021-07-28 NOTE — Assessment & Plan Note (Signed)
   ED Imaging revealed: Noncontrast MRI of the brain revealed acute infarcts in the left frontal and parietal lobes, with the largest area involved being the the left posteroinferior frontal cortex.  Radiology is particularly concerned about possible embolic etiology.  Symptoms were initially seem to be an expressive aphasia and some left facial droop however have essentially resolved since arrival to the emergency department.  Performing serial neurologic checks  Monitoring patient on telemetry  325 mg of aspirin administered in the emergency department followed by aspirin 81 mg daily  Additionally providing patient with clopidogrel 75 mg daily per neurology recommendation.  Daily statin therapy  MRA of the head and neck does not reveal any significant stenosis  Obtaining hemoglobin A1c and lipid panel in the morning  Echocardiogram in the morning  PT, OT, SLP evaluation  Permissive hypertension with as needed antihypertensives only to be given if blood pressure greater than 220/115  Neurology following in consultation, their assistance is appreciated

## 2021-07-28 NOTE — Assessment & Plan Note (Signed)
.   Continuing home regimen of lipid lowering therapy. . We will titrate regimen or switch to a higher intensity statin if LDL is greater than 70

## 2021-07-28 NOTE — Progress Notes (Signed)
  Echocardiogram 2D Echocardiogram has been performed.  [name] 07/28/2021, 4:47 PM

## 2021-07-28 NOTE — Care Management Obs Status (Signed)
[place] NOTIFICATION   Patient Details  Name: Steve [name] MRN: [id number] Date of Birth: May 20, 1951   Medicare Observation Status Notification Given:  Yes    Pollie Friar, RN 07/28/2021, 3:14 PM

## 2021-07-28 NOTE — Assessment & Plan Note (Signed)
Continuing home regimen of daily PPI therapy.  

## 2021-07-28 NOTE — H&P (Signed)
History and Physical    Steve [name]:[id number] DOB: 10-Sep-1951 DOA: 07/27/2021  PCP: Binnie Rail, MD  Patient coming from: Home via EMS   Chief Complaint:  Chief Complaint  Patient presents with   Code Stroke     HPI:    70 year old male with past medical history of gastroesophageal reflux disease, hyperlipidemia, prostate cancer (follows with Dr. 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  Last known well was approximately 8 AM when the patient began to have difficulty speaking.  He describes it as "lots of stuttering."    This difficulty with finding and speaking words continued to persist throughout the day for several hours.  Family also reports an associated right  facial droop however patient reports that this is chronic due to a history of receiving Botox injections around the right eyelid.  By the afternoon, the family contacted the patient's outpatient neurologist who upon hearing about the patient's symptoms advised the patient immediately go to the emergency department for evaluation.  There was no associated focal weakness, loss of balance confusion, visual change or headache.  EMS was then contacted and the patient was promptly brought into [facility] emergency department for evaluation.  Patient was brought into [facility] emergency department as a code stroke.  Upon initial evaluation patient was promptly evaluated by the emergency department provider as well as Dr. Theda Sers with neurology.  Upon evaluation in the emergency department patient's neurologic deficits resolved to baseline consistent with a TIA.  tPA was not administered.  Noncontrast CT head revealed no acute abnormality.  Neurology recommended hospitalization on the hospitalist service for full TIA work-up.   The hospitalist group was then called to assess the patient for admission to the hospital.     Review of Systems:   Review of Systems  Neurological:  Positive for speech change.  All other systems reviewed and are negative.  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   Sleep apnea    Resolved   Squamous cell skin cancer    Dr.King [place]   Stricture esophagus 2013    dilation X 1   Stroke ([place]) 2016   left semiovale stroke    TIA (transient ischemic attack) 01/2015   Ulcerative esophagitis 2013    Past Surgical History:  Procedure Laterality Date   COLONOSCOPY W/ POLYPECTOMY  2013, 2018 last done   tubular adenoma   COLONOSCOPY W/ POLYPECTOMY  2008   hyperplastic   CYSTOSCOPY N/A 09/19/2020   Procedure: CYSTOSCOPY FLEXIBLE;  Surgeon: Franchot Gallo, MD;  Location: [facility];  Service: Urology;  Laterality: N/A;   EP IMPLANTABLE DEVICE N/A 01/03/2016   Procedure: Loop Recorder Insertion;  Surgeon: Dani Gobble  Croitoru, MD;  Location: [place] [facility];  Service: Cardiovascular;  Laterality: N/A;   MOHS SURGERY     x 3   NASAL SINUS SURGERY  15 yrs ago   PATENT FORAMEN OVALE(PFO) CLOSURE N/A 03/27/2018   Procedure: PATENT FORAMEN OVALE (PFO) CLOSURE;  Surgeon: Sherren Mocha, MD;  Location: [place] [facility];  Service: Cardiovascular;  Laterality: N/A;   PROSTATE BIOPSY  2021   RADIOACTIVE SEED IMPLANT N/A 09/19/2020   Procedure: RADIOACTIVE SEED IMPLANT/BRACHYTHERAPY IMPLANT;  Surgeon:  Franchot Gallo, MD;  Location: [facility];  Service: Urology;  Laterality: N/A;  90 MINS   REVERSE SHOULDER ARTHROPLASTY Right 03/10/2019   Procedure: REVERSE SHOULDER ARTHROPLASTY;  Surgeon: Marchia Bond, MD;  Location: [facility];  Service: Orthopedics;  Laterality: Right;   ROTATOR CUFF REPAIR Left yrs ago   SPACE OAR INSTILLATION N/A 09/19/2020   Procedure: SPACE OAR INSTILLATION;  Surgeon: Franchot Gallo, MD;  Location: [facility];  Service: Urology;  Laterality: N/A;   TEE WITHOUT CARDIOVERSION N/A 01/06/2018   Procedure: TRANSESOPHAGEAL ECHOCARDIOGRAM (TEE);  Surgeon: Sanda Klein, MD;  Location: [facility];  Service: Cardiovascular;  Laterality: N/A;   torn biceps tendon Left yrs ago   surgical repair   uvulectomy & tonsillectomy  2008   Dr Wilburn Cornelia     reports that he quit smoking about 39 years ago. His smoking use included cigarettes. He has a 5.00 pack-year smoking history. He has never used smokeless tobacco. He reports current alcohol use of about 5.0 - 6.0 standard drinks per week. He reports that he does not use drugs.  Allergies  Allergen Reactions   Pollen Extract     Eyes itch and water    Family History  Problem Relation Age of Onset   Uterine cancer Mother    Breast cancer Mother        Deceased, 11   Stroke Father        > 35   Prostate cancer Father        Deceased, 60   Diabetes Daughter        type 1   Stroke Paternal Uncle        > 68   Heart attack Maternal Grandfather         >55/mid-60s   Hyperlipidemia Son    Healthy Brother    Colon cancer Neg Hx    Rectal cancer Neg Hx    Stomach cancer Neg Hx    Esophageal cancer Neg Hx      Prior to Admission medications   Medication Sig Start Date End Date Taking? Authorizing Provider  aspirin 81 MG tablet Take 81 mg by mouth daily.    [provider]  botulinum toxin Type A (BOTOX) 100 units SOLR injection Botox 100 unit injection  Take 100 units  every 3 months by injection route.    [provider]  cholecalciferol (VITAMIN D3) 25 MCG (1000 UNIT) tablet Take 1,000 Units by mouth daily.    [provider]  fluticasone (FLONASE) 50 MCG/ACT nasal spray ONE SPRAY IN EACH NOSTRIL TWICE DAILY ASNEEDED -USE CROSSOVER TECHNIQUE DISCUSSED 06/23/21   Binnie Rail, MD  ibuprofen (ADVIL) 800 MG tablet Take 1 tablet (800 mg total) by mouth 3 (three) times daily. 12/04/20   Fransico Meadow, PA-C  Multiple Vitamins-Minerals (PRESERVISION AREDS 2) CAPS Take 1 capsule by mouth 2 (two) times a day.    [provider]  pantoprazole (PROTONIX) 40 MG tablet  TAKE ONE TABLET DAILY 06/07/21   Binnie Rail, MD  simvastatin (ZOCOR) 40 MG tablet TAKE ONE TABLET AT BEDTIME Patient taking differently: Take 40 mg by mouth at bedtime. 11/03/20   Binnie Rail, MD  tamsulosin (FLOMAX) 0.4 MG CAPS capsule Take 0.4 mg by mouth daily. 10/13/20   [provider]  vardenafil (LEVITRA) 20 MG tablet Take 20 mg by mouth daily as needed for erectile dysfunction.    [provider]  vitamin B-12 (CYANOCOBALAMIN) 1000 MCG tablet Take 1,000 mcg by mouth daily.    [provider]  vitamin C (ASCORBIC ACID) 500 MG tablet Take 500 mg by mouth daily.    [provider]    Physical Exam: Vitals:   07/27/21 2308 07/28/21 0000 07/28/21 0100 07/28/21 0200  BP: (!) 151/90 (!) 142/92 127/84 122/76  Pulse: 70 74 67 64  Resp: 13 15 16 12   Temp:      TempSrc:      SpO2: 99% 98% 97% 97%    Constitutional: Awake alert and oriented x3, no associated distress.   Skin: no rashes, no lesions, good skin turgor noted. Eyes: Pupils are equally reactive to light.  No evidence of scleral icterus or conjunctival pallor.  ENMT: Moist mucous membranes noted.  Posterior pharynx clear of any exudate or lesions.   Neck: normal, supple, no masses, no thyromegaly.  No evidence of jugular venous distension.   Respiratory: clear to  auscultation bilaterally, no wheezing, no crackles. Normal respiratory effort. No accessory muscle use.  Cardiovascular: Regular rate and rhythm, no murmurs / rubs / gallops. No extremity edema. 2+ pedal pulses. No carotid bruits.  Chest:   Nontender without crepitus or deformity.   Back:   Nontender without crepitus or deformity. Abdomen: Abdomen is soft and nontender.  No evidence of intra-abdominal masses.  Positive bowel sounds noted in all quadrants.   Musculoskeletal: No joint deformity upper and lower extremities. Good ROM, no contractures. Normal muscle tone.  Neurologic: Notable right facial droop without sparing of the right eyelid.  Otherwise, CN 2-12 grossly intact. Sensation intact.  Patient moving all 4 extremities spontaneously.  Patient is following all commands.  Patient is responsive to verbal stimuli.   Psychiatric: Patient exhibits normal mood with appropriate affect.  Patient seems to possess insight as to their current situation.     Labs on Admission: I have personally reviewed following labs and imaging studies -   CBC: Recent Labs  Lab 07/27/21 2022 07/27/21 2028  WBC 6.1  --   NEUTROABS 3.5  --   HGB 15.8 15.6  HCT 47.4 46.0  MCV 94.8  --   PLT 162  --    Basic Metabolic Panel: Recent Labs  Lab 07/27/21 2022 07/27/21 2028  NA 138 140  K 3.5 3.5  CL 105 105  CO2 22  --   GLUCOSE 113* 110*  BUN 16 17  CREATININE 0.97 1.00  CALCIUM 9.1  --    GFR: Estimated Creatinine Clearance: 81 mL/min (by C-G formula based on SCr of 1 mg/dL). Liver Function Tests: Recent Labs  Lab 07/27/21 2022  AST 30  ALT 39  ALKPHOS 74  BILITOT 0.6  PROT 7.2  ALBUMIN 4.1   No results for input(s): LIPASE, AMYLASE in the last 168 hours. No results for input(s): AMMONIA in the last 168 hours. Coagulation Profile: Recent Labs  Lab 07/27/21 2022  INR 1.0   Cardiac Enzymes: No results for input(s): CKTOTAL, CKMB, CKMBINDEX, TROPONINI  in the last 168 hours. BNP  (last 3 results) No results for input(s): PROBNP in the last 8760 hours. HbA1C: No results for input(s): HGBA1C in the last 72 hours. CBG: Recent Labs  Lab 07/27/21 2023  GLUCAP 117*   Lipid Profile: No results for input(s): CHOL, HDL, LDLCALC, TRIG, CHOLHDL, LDLDIRECT in the last 72 hours. Thyroid Function Tests: No results for input(s): TSH, T4TOTAL, FREET4, T3FREE, THYROIDAB in the last 72 hours. Anemia Panel: No results for input(s): VITAMINB12, FOLATE, FERRITIN, TIBC, IRON, RETICCTPCT in the last 72 hours. Urine analysis:    Component Value Date/Time   COLORURINE YELLOW 12/04/2020 1008   APPEARANCEUR CLEAR 12/04/2020 1008   LABSPEC 1.017 12/04/2020 1008   PHURINE 5.0 12/04/2020 1008   GLUCOSEU NEGATIVE 12/04/2020 1008   HGBUR NEGATIVE 12/04/2020 1008   [place] 12/04/2020 1008   BILIRUBINUR neg 03/23/2015 1707   KETONESUR 5 (A) 12/04/2020 1008   PROTEINUR NEGATIVE 12/04/2020 1008   UROBILINOGEN 0.2 03/23/2015 1707   NITRITE NEGATIVE 12/04/2020 1008   LEUKOCYTESUR NEGATIVE 12/04/2020 1008    Radiological Exams on Admission - Personally Reviewed: MR ANGIO HEAD WO CONTRAST  Result Date: 07/27/2021 CLINICAL DATA:  Stroke code follow-up EXAM: MRI HEAD WITHOUT CONTRAST MRA HEAD WITHOUT CONTRAST MRA NECK WITHOUT CONTRAST TECHNIQUE: Multiplanar, multi-echo pulse sequences of the brain and surrounding structures were acquired without intravenous contrast. Angiographic images of the Circle of Willis were acquired using MRA technique without intravenous contrast. Angiographic images of the neck were acquired using MRA technique without intravenous contrast. Carotid stenosis measurements (when applicable) are obtained utilizing NASCET criteria, using the distal internal carotid diameter as the denominator. COMPARISON:  03/18/2015 MRI/MRA done correlation is made with same day CT head FINDINGS: MRI HEAD FINDINGS Brain: Multiple areas of restricted diffusion, the largest of  which is an area of posterior inferior left frontal cortex (series 2, image 29), additional punctate areas of restricted diffusion are seen in the anterior and posterior left frontal lobe (series 2, images 30-34 and 41) and left parietal lobe (series 2, images 37 and 38). No acute hemorrhage, hydrocephalus, extra-axial collection, mass, mass effect, or midline shift. Vascular: See MRA findings, below. Skull and upper cervical spine: Normal marrow signal. Sinuses/Orbits: No acute or significant finding. Other: Trace fluid in right mastoid air cells. MRA HEAD FINDINGS Anterior circulation: Both internal carotid arteries are patent to the termini, without stenosis or other abnormality. A1 segments patent. Normal anterior communicating artery. Anterior cerebral arteries are patent to their distal aspects. No M1 stenosis or occlusion. Normal MCA bifurcations. Distal MCA branches perfused and symmetric. Posterior circulation: Vertebral arteries widely patent to the vertebrobasilar junction without stenosis. Posterior inferior cerebral arteries patent bilaterally. Basilar patent to its distal aspect. Superior cerebral arteries patent bilaterally. PCAs well perfused to their distal aspects without stenosis. Patent right posterior communicating artery. Anatomic variants: None significant MRA NECK FINDINGS Aortic arch: Not included in the field of view. Right carotid system: No stenosis (greater than 50%), dissection, or occlusion in the imaged right carotid artery. Left carotid system: No stenosis (greater than 50%), dissection, or occlusion in the imaged left carotid artery. Vertebral arteries: Right dominant. No stenosis or occlusion in the imaged portions. Other: None IMPRESSION: 1. Acute infarcts in the left frontal and parietal lobes, with the largest area involving the left posteroinferior frontal cortex. Multiple punctate infarcts in multiple vascular territories suggest embolic etiology. 2. No intracranial large  vessel occlusion. 3. No hemodynamically significant stenosis in the neck. Imaging results were communicated  on 07/27/2021 at 11:09 pm to provider [facility] via secure text paging. Electronically Signed   By: Merilyn Baba M.D.   On: 07/27/2021 23:10   MR ANGIO NECK WO CONTRAST  Result Date: 07/27/2021 CLINICAL DATA:  Stroke code follow-up EXAM: MRI HEAD WITHOUT CONTRAST MRA HEAD WITHOUT CONTRAST MRA NECK WITHOUT CONTRAST TECHNIQUE: Multiplanar, multi-echo pulse sequences of the brain and surrounding structures were acquired without intravenous contrast. Angiographic images of the Circle of Willis were acquired using MRA technique without intravenous contrast. Angiographic images of the neck were acquired using MRA technique without intravenous contrast. Carotid stenosis measurements (when applicable) are obtained utilizing NASCET criteria, using the distal internal carotid diameter as the denominator. COMPARISON:  03/18/2015 MRI/MRA done correlation is made with same day CT head FINDINGS: MRI HEAD FINDINGS Brain: Multiple areas of restricted diffusion, the largest of which is an area of posterior inferior left frontal cortex (series 2, image 29), additional punctate areas of restricted diffusion are seen in the anterior and posterior left frontal lobe (series 2, images 30-34 and 41) and left parietal lobe (series 2, images 37 and 38). No acute hemorrhage, hydrocephalus, extra-axial collection, mass, mass effect, or midline shift. Vascular: See MRA findings, below. Skull and upper cervical spine: Normal marrow signal. Sinuses/Orbits: No acute or significant finding. Other: Trace fluid in right mastoid air cells. MRA HEAD FINDINGS Anterior circulation: Both internal carotid arteries are patent to the termini, without stenosis or other abnormality. A1 segments patent. Normal anterior communicating artery. Anterior cerebral arteries are patent to their distal aspects. No M1 stenosis or occlusion. Normal MCA  bifurcations. Distal MCA branches perfused and symmetric. Posterior circulation: Vertebral arteries widely patent to the vertebrobasilar junction without stenosis. Posterior inferior cerebral arteries patent bilaterally. Basilar patent to its distal aspect. Superior cerebral arteries patent bilaterally. PCAs well perfused to their distal aspects without stenosis. Patent right posterior communicating artery. Anatomic variants: None significant MRA NECK FINDINGS Aortic arch: Not included in the field of view. Right carotid system: No stenosis (greater than 50%), dissection, or occlusion in the imaged right carotid artery. Left carotid system: No stenosis (greater than 50%), dissection, or occlusion in the imaged left carotid artery. Vertebral arteries: Right dominant. No stenosis or occlusion in the imaged portions. Other: None IMPRESSION: 1. Acute infarcts in the left frontal and parietal lobes, with the largest area involving the left posteroinferior frontal cortex. Multiple punctate infarcts in multiple vascular territories suggest embolic etiology. 2. No intracranial large vessel occlusion. 3. No hemodynamically significant stenosis in the neck. Imaging results were communicated on 07/27/2021 at 11:09 pm to provider [facility] via secure text paging. Electronically Signed   By: Merilyn Baba M.D.   On: 07/27/2021 23:10   MR BRAIN WO CONTRAST  Result Date: 07/27/2021 CLINICAL DATA:  Stroke code follow-up EXAM: MRI HEAD WITHOUT CONTRAST MRA HEAD WITHOUT CONTRAST MRA NECK WITHOUT CONTRAST TECHNIQUE: Multiplanar, multi-echo pulse sequences of the brain and surrounding structures were acquired without intravenous contrast. Angiographic images of the Circle of Willis were acquired using MRA technique without intravenous contrast. Angiographic images of the neck were acquired using MRA technique without intravenous contrast. Carotid stenosis measurements (when applicable) are obtained utilizing NASCET criteria, using  the distal internal carotid diameter as the denominator. COMPARISON:  03/18/2015 MRI/MRA done correlation is made with same day CT head FINDINGS: MRI HEAD FINDINGS Brain: Multiple areas of restricted diffusion, the largest of which is an area of posterior inferior left frontal cortex (series 2, image 29), additional punctate areas of restricted  diffusion are seen in the anterior and posterior left frontal lobe (series 2, images 30-34 and 41) and left parietal lobe (series 2, images 37 and 38). No acute hemorrhage, hydrocephalus, extra-axial collection, mass, mass effect, or midline shift. Vascular: See MRA findings, below. Skull and upper cervical spine: Normal marrow signal. Sinuses/Orbits: No acute or significant finding. Other: Trace fluid in right mastoid air cells. MRA HEAD FINDINGS Anterior circulation: Both internal carotid arteries are patent to the termini, without stenosis or other abnormality. A1 segments patent. Normal anterior communicating artery. Anterior cerebral arteries are patent to their distal aspects. No M1 stenosis or occlusion. Normal MCA bifurcations. Distal MCA branches perfused and symmetric. Posterior circulation: Vertebral arteries widely patent to the vertebrobasilar junction without stenosis. Posterior inferior cerebral arteries patent bilaterally. Basilar patent to its distal aspect. Superior cerebral arteries patent bilaterally. PCAs well perfused to their distal aspects without stenosis. Patent right posterior communicating artery. Anatomic variants: None significant MRA NECK FINDINGS Aortic arch: Not included in the field of view. Right carotid system: No stenosis (greater than 50%), dissection, or occlusion in the imaged right carotid artery. Left carotid system: No stenosis (greater than 50%), dissection, or occlusion in the imaged left carotid artery. Vertebral arteries: Right dominant. No stenosis or occlusion in the imaged portions. Other: None IMPRESSION: 1. Acute infarcts in  the left frontal and parietal lobes, with the largest area involving the left posteroinferior frontal cortex. Multiple punctate infarcts in multiple vascular territories suggest embolic etiology. 2. No intracranial large vessel occlusion. 3. No hemodynamically significant stenosis in the neck. Imaging results were communicated on 07/27/2021 at 11:09 pm to provider [facility] via secure text paging. Electronically Signed   By: Merilyn Baba M.D.   On: 07/27/2021 23:10   CT HEAD CODE STROKE WO CONTRAST  Result Date: 07/27/2021 CLINICAL DATA:  Code stroke. EXAM: CT HEAD WITHOUT CONTRAST TECHNIQUE: Contiguous axial images were obtained from the base of the skull through the vertex without intravenous contrast. COMPARISON:  None. FINDINGS: Brain: There is no acute intracranial hemorrhage, mass effect, or edema. Gray-white differentiation is preserved. Ventricles and sulci are normal in size and configuration. No extra-axial collection. Vascular: No definite hyperdense vessel. Mild intracranial atherosclerotic calcification at the skull base. Skull: Unremarkable. Sinuses/Orbits: Aerated.  Orbits are unremarkable. Other: Mastoid air cells are clear. ASPECTS ([place] Stroke Program Early CT Score) - Ganglionic level infarction (caudate, lentiform nuclei, internal capsule, insula, M1-M3 cortex): 7 - Supraganglionic infarction (M4-M6 cortex): 3 Total score (0-10 with 10 being normal): 10 IMPRESSION: There is no acute intracranial hemorrhage or evidence of acute infarction. ASPECT score is 10. These results were communicated to Dr. Theda Sers at 8:40 pm on 07/27/2021 by text page via the [facility] messaging system. Electronically Signed   By: Macy Mis M.D.   On: 07/27/2021 20:41    EKG: Personally reviewed.  Rhythm is normal sinus rhythm with heart rate of 78 bpm.  Left anterior fascicular block.  No dynamic ST segment changes appreciated.  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  Radiology is particularly concerned about possible embolic etiology. Symptoms were initially seem to be an expressive aphasia and some left facial droop however have essentially resolved since arrival to the emergency department. Performing serial neurologic checks Monitoring patient on telemetry 325 mg of  aspirin administered in the emergency department followed by aspirin 81 mg daily Additionally providing patient with clopidogrel 75 mg daily per neurology recommendation. Daily statin therapy MRA of the head and neck does not reveal any significant stenosis Obtaining hemoglobin A1c and lipid panel in the morning Echocardiogram in the morning PT, OT, SLP evaluation Permissive hypertension with as needed antihypertensives only to be given if blood pressure greater than 220/115 Neurology following in consultation, their assistance is appreciated   Mixed hyperlipidemia Continuing home regimen of lipid lowering therapy. We will titrate regimen or switch to a higher intensity statin if LDL is greater than 70   Patent foramen ovale with atrial septal aneurysm, s/p closure 6/19 Known history of PFO since stroke in 2016 s/p closure 03/2018 Will reassess this via echocardiogram tomorrow morning with bubble study  GERD without esophagitis Continuing home regimen of daily PPI therapy.   Malignant neoplasm of prostate ([place]) Continue Flomax Outpatient follow-up with urology     Code Status:  Full code  code status decision has been confirmed with: patient Family Communication: Wife is at bedside who has been updated on plan of care.  Status is: Observation  The patient remains OBS appropriate and will d/c before 2 midnights.       Vernelle Emerald MD [facility] Pager  [phone]  If 7PM-7AM, please contact night-coverage [email or web address] Use universal [place] password for that web site. If you do not have the password, please call the hospital operator.  07/28/2021, 3:11 AM

## 2021-07-28 NOTE — ED Notes (Signed)
Lunch ordered 

## 2021-07-28 NOTE — Progress Notes (Signed)
Patient ID: Steve [name], male   DOB: 09-Dec-1950, 70 y.o.   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  He has had an event recorder and for 3+ years that failed to show any evidence of atrial fibrillation.  He has been off his aspirin for the past 2 months he was brought in to the ED as a code stroke after noticing difficulty with speaking and stuttering and inability to type and spell words correctly.  Noncontrast CT was negative.  He arrived outside the window for tPA.  MRI revealed acute infarcts in the left frontal and parietal lobes that were punctate and in multiple vascular territories suggestive of embolic phenomenon.  No intracranial large vessel occlusion and no significant stenosis in the neck.   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  If neg, he can go tomorrow and will do 30 day cardiac event monitoring to further rule out afib.  DAPT for 3 weeks and then ASA alone life long  Mixed hyperlipidemia Continue statin Titrate to goal of LDL less than 70  GERD Continue PPI  Malignant neoplasm of the prostate Continue Flomax Check CT Outpatient urology follow-up   DVT prophylaxis: SCD/Compression stockings Code Status: Full code  Family Communication: Patient at bedside Disposition Plan: Home patient remains inpatient  due to ongoing work-up   Consultants:  Neurology  Procedures: Bubble study Echocardiogram  Antimicrobials: Anti-infectives (From admission, onward)    None        Subjective: Patient reports he feels back to his baseline.  He relays without further symptoms of or progression of the findings that recommend.  Objective: Vitals:   07/28/21 0600 07/28/21 0700 07/28/21 1100 07/28/21 1200  BP: 121/76 136/86 117/75 124/88  Pulse: (!) 57 64 70 73  Resp: 12 17 16 15   Temp:      TempSrc:      SpO2: 96% 96% 94% 96%    Intake/Output Summary (Last 24 hours) at 07/28/2021 1302 Last data filed at 07/28/2021 0949 Gross per 24 hour  Intake --  Output 400 ml  Net -400 ml   There were no vitals filed for this visit.  Examination:  General exam: Appears calm and comfortable  Respiratory system: Clear to auscultation. Respiratory effort normal. Cardiovascular system: S1 & S2 heard, RRR.  Gastrointestinal system: Abdomen is nondistended, soft and nontender.  Central nervous system: Alert and oriented. No focal neurological deficits. Extremities: Symmetric  Skin: No rashes Psychiatry: Judgement and insight appear normal. Mood & affect appropriate.     Data Reviewed: I have personally reviewed following labs and imaging studies  CBC: Recent Labs  Lab 07/27/21 2022 07/27/21 2028 07/28/21 0330  WBC 6.1  --  5.4  NEUTROABS 3.5  --  3.1  HGB 15.8 15.6 14.9  HCT 47.4 46.0 43.6  MCV 94.8  --  92.4  PLT 162  --  694   Basic Metabolic Panel: Recent Labs  Lab 07/27/21 2022 07/27/21 2028 07/28/21 0330  NA 138 140 139  K 3.5 3.5 4.5  CL 105 105 106  CO2 22  --  28  GLUCOSE 113* 110* 107*  BUN 16 17 14   CREATININE 0.97 1.00 1.06  CALCIUM 9.1  --  8.9  MG  --   --  2.3   GFR: Estimated Creatinine Clearance: 76.4 mL/min (by C-G formula based on SCr of 1.06 mg/dL). Liver Function Tests: Recent Labs  Lab 07/27/21 2022 07/28/21 0330  AST 30 30  ALT 39 36  ALKPHOS  74 62  BILITOT 0.6 0.6  PROT 7.2 6.7  ALBUMIN 4.1 3.7    Coagulation Profile: Recent Labs  Lab 07/27/21 2022  INR 1.0    HbA1C: Recent Labs    07/28/21 0330  HGBA1C 5.6   CBG: Recent Labs  Lab 07/27/21 2023  GLUCAP 117*   Lipid Profile: Recent Labs    07/28/21 0330  CHOL 166  HDL 47  LDLCALC 104*  TRIG 74  CHOLHDL 3.5    Radiology Studies: MR ANGIO HEAD WO CONTRAST  Result Date: 07/27/2021 CLINICAL DATA:  Stroke code follow-up EXAM: MRI HEAD WITHOUT CONTRAST MRA HEAD WITHOUT CONTRAST MRA NECK WITHOUT CONTRAST TECHNIQUE: Multiplanar, multi-echo pulse sequences of the brain and surrounding structures were acquired without intravenous contrast. Angiographic images of the Circle of Willis were acquired using MRA technique without intravenous contrast. Angiographic images of the neck were acquired using MRA technique without intravenous contrast. Carotid stenosis measurements (when applicable) are obtained utilizing NASCET criteria, using the distal internal carotid diameter as the denominator. COMPARISON:  03/18/2015 MRI/MRA done correlation is made with same day CT head FINDINGS: MRI HEAD FINDINGS Brain: Multiple areas of restricted diffusion, the largest of which is an area of posterior inferior left frontal cortex (series 2, image 29), additional punctate areas of restricted diffusion are seen in the anterior and posterior left frontal lobe (series 2, images 30-34 and 41) and left parietal lobe (series 2, images 37 and 38). No acute hemorrhage, hydrocephalus, extra-axial collection, mass, mass effect, or midline shift. Vascular: See MRA findings, below. Skull and upper cervical spine: Normal marrow signal. Sinuses/Orbits: No acute or significant finding. Other: Trace fluid in right mastoid air cells. MRA HEAD FINDINGS Anterior circulation: Both internal carotid arteries are patent to the termini, without stenosis or other abnormality. A1 segments patent. Normal anterior  communicating artery. Anterior cerebral arteries are patent to their distal aspects. No M1 stenosis or occlusion. Normal MCA bifurcations. Distal MCA branches perfused and symmetric. Posterior circulation: Vertebral arteries widely patent to the vertebrobasilar junction without stenosis. Posterior inferior cerebral arteries patent bilaterally. Basilar patent to its distal aspect. Superior cerebral arteries patent bilaterally. PCAs well perfused to their distal aspects without stenosis. Patent right posterior communicating artery. Anatomic variants: None significant MRA NECK FINDINGS Aortic arch: Not included in the field of view. Right carotid system: No stenosis (greater than 50%), dissection, or occlusion in the imaged right carotid artery. Left carotid system: No stenosis (greater than 50%), dissection, or occlusion in the imaged left carotid artery. Vertebral arteries: Right dominant. No stenosis or occlusion in the imaged portions. Other: None IMPRESSION: 1. Acute infarcts in the left frontal and parietal lobes, with the largest area involving the left posteroinferior frontal cortex. Multiple punctate infarcts in multiple vascular territories suggest embolic etiology. 2. No intracranial large vessel occlusion. 3. No hemodynamically significant  stenosis in the neck. Imaging results were communicated on 07/27/2021 at 11:09 pm to provider [facility] via secure text paging. Electronically Signed   By: Merilyn Baba M.D.   On: 07/27/2021 23:10   MR ANGIO NECK WO CONTRAST  Result Date: 07/27/2021 CLINICAL DATA:  Stroke code follow-up EXAM: MRI HEAD WITHOUT CONTRAST MRA HEAD WITHOUT CONTRAST MRA NECK WITHOUT CONTRAST TECHNIQUE: Multiplanar, multi-echo pulse sequences of the brain and surrounding structures were acquired without intravenous contrast. Angiographic images of the Circle of Willis were acquired using MRA technique without intravenous contrast. Angiographic images of the neck were acquired using MRA  technique without intravenous contrast. Carotid stenosis measurements (when applicable) are obtained utilizing NASCET criteria, using the distal internal carotid diameter as the denominator. COMPARISON:  03/18/2015 MRI/MRA done correlation is made with same day CT head FINDINGS: MRI HEAD FINDINGS Brain: Multiple areas of restricted diffusion, the largest of which is an area of posterior inferior left frontal cortex (series 2, image 29), additional punctate areas of restricted diffusion are seen in the anterior and posterior left frontal lobe (series 2, images 30-34 and 41) and left parietal lobe (series 2, images 37 and 38). No acute hemorrhage, hydrocephalus, extra-axial collection, mass, mass effect, or midline shift. Vascular: See MRA findings, below. Skull and upper cervical spine: Normal marrow signal. Sinuses/Orbits: No acute or significant finding. Other: Trace fluid in right mastoid air cells. MRA HEAD FINDINGS Anterior circulation: Both internal carotid arteries are patent to the termini, without stenosis or other abnormality. A1 segments patent. Normal anterior communicating artery. Anterior cerebral arteries are patent to their distal aspects. No M1 stenosis or occlusion. Normal MCA bifurcations. Distal MCA branches perfused and symmetric. Posterior circulation: Vertebral arteries widely patent to the vertebrobasilar junction without stenosis. Posterior inferior cerebral arteries patent bilaterally. Basilar patent to its distal aspect. Superior cerebral arteries patent bilaterally. PCAs well perfused to their distal aspects without stenosis. Patent right posterior communicating artery. Anatomic variants: None significant MRA NECK FINDINGS Aortic arch: Not included in the field of view. Right carotid system: No stenosis (greater than 50%), dissection, or occlusion in the imaged right carotid artery. Left carotid system: No stenosis (greater than 50%), dissection, or occlusion in the imaged left carotid  artery. Vertebral arteries: Right dominant. No stenosis or occlusion in the imaged portions. Other: None IMPRESSION: 1. Acute infarcts in the left frontal and parietal lobes, with the largest area involving the left posteroinferior frontal cortex. Multiple punctate infarcts in multiple vascular territories suggest embolic etiology. 2. No intracranial large vessel occlusion. 3. No hemodynamically significant stenosis in the neck. Imaging results were communicated on 07/27/2021 at 11:09 pm to provider [facility] via secure text paging. Electronically Signed   By: Merilyn Baba M.D.   On: 07/27/2021 23:10   MR BRAIN WO CONTRAST  Result Date: 07/27/2021 CLINICAL DATA:  Stroke code follow-up EXAM: MRI HEAD WITHOUT CONTRAST MRA HEAD WITHOUT CONTRAST MRA NECK WITHOUT CONTRAST TECHNIQUE: Multiplanar, multi-echo pulse sequences of the brain and surrounding structures were acquired without intravenous contrast. Angiographic images of the Circle of Willis were acquired using MRA technique without intravenous contrast. Angiographic images of the neck were acquired using MRA technique without intravenous contrast. Carotid stenosis measurements (when applicable) are obtained utilizing NASCET criteria, using the distal internal carotid diameter as the denominator. COMPARISON:  03/18/2015 MRI/MRA done correlation is made with same day CT head FINDINGS: MRI HEAD FINDINGS Brain: Multiple areas of restricted diffusion, the largest of which is an area of posterior inferior left frontal cortex (series  2, image 29), additional punctate areas of restricted diffusion are seen in the anterior and posterior left frontal lobe (series 2, images 30-34 and 41) and left parietal lobe (series 2, images 37 and 38). No acute hemorrhage, hydrocephalus, extra-axial collection, mass, mass effect, or midline shift. Vascular: See MRA findings, below. Skull and upper cervical spine: Normal marrow signal. Sinuses/Orbits: No acute or significant finding.  Other: Trace fluid in right mastoid air cells. MRA HEAD FINDINGS Anterior circulation: Both internal carotid arteries are patent to the termini, without stenosis or other abnormality. A1 segments patent. Normal anterior communicating artery. Anterior cerebral arteries are patent to their distal aspects. No M1 stenosis or occlusion. Normal MCA bifurcations. Distal MCA branches perfused and symmetric. Posterior circulation: Vertebral arteries widely patent to the vertebrobasilar junction without stenosis. Posterior inferior cerebral arteries patent bilaterally. Basilar patent to its distal aspect. Superior cerebral arteries patent bilaterally. PCAs well perfused to their distal aspects without stenosis. Patent right posterior communicating artery. Anatomic variants: None significant MRA NECK FINDINGS Aortic arch: Not included in the field of view. Right carotid system: No stenosis (greater than 50%), dissection, or occlusion in the imaged right carotid artery. Left carotid system: No stenosis (greater than 50%), dissection, or occlusion in the imaged left carotid artery. Vertebral arteries: Right dominant. No stenosis or occlusion in the imaged portions. Other: None IMPRESSION: 1. Acute infarcts in the left frontal and parietal lobes, with the largest area involving the left posteroinferior frontal cortex. Multiple punctate infarcts in multiple vascular territories suggest embolic etiology. 2. No intracranial large vessel occlusion. 3. No hemodynamically significant stenosis in the neck. Imaging results were communicated on 07/27/2021 at 11:09 pm to provider [facility] via secure text paging. Electronically Signed   By: Merilyn Baba M.D.   On: 07/27/2021 23:10   CT HEAD CODE STROKE WO CONTRAST  Result Date: 07/27/2021 CLINICAL DATA:  Code stroke. EXAM: CT HEAD WITHOUT CONTRAST TECHNIQUE: Contiguous axial images were obtained from the base of the skull through the vertex without intravenous contrast. COMPARISON:   None. FINDINGS: Brain: There is no acute intracranial hemorrhage, mass effect, or edema. Gray-white differentiation is preserved. Ventricles and sulci are normal in size and configuration. No extra-axial collection. Vascular: No definite hyperdense vessel. Mild intracranial atherosclerotic calcification at the skull base. Skull: Unremarkable. Sinuses/Orbits: Aerated.  Orbits are unremarkable. Other: Mastoid air cells are clear. ASPECTS ([place] Stroke Program Early CT Score) - Ganglionic level infarction (caudate, lentiform nuclei, internal capsule, insula, M1-M3 cortex): 7 - Supraganglionic infarction (M4-M6 cortex): 3 Total score (0-10 with 10 being normal): 10 IMPRESSION: There is no acute intracranial hemorrhage or evidence of acute infarction. ASPECT score is 10. These results were communicated to Dr. Theda Sers at 8:40 pm on 07/27/2021 by text page via the [facility] messaging system. Electronically Signed   By: Macy Mis M.D.   On: 07/27/2021 20:41     Scheduled Meds:  aspirin EC  81 mg Oral Daily   atorvastatin  40 mg Oral Daily   clopidogrel  75 mg Oral Daily   enoxaparin (LOVENOX) injection  40 mg Subcutaneous Daily   pantoprazole  40 mg Oral Daily   tamsulosin  0.4 mg Oral Daily   Continuous Infusions:   LOS: 0 days    Donnamae Jude, MD 07/28/2021 1:02 PM [phone] [facility] If 7PM-7AM, please contact night-coverage 07/28/2021, 1:02 PM

## 2021-07-28 NOTE — Evaluation (Addendum)
Occupational Therapy Evaluation Patient Details Name: Steve [name] MRN: [id number] DOB: [birth year] Today's Date: 07/28/2021   History of Present Illness 70 y/o male admitted 10/20 secondary to expressive difficulty and L facial droop. 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 PMH includes CVA and prostate cancer.   Clinical Impression   PTA, pt was living with his wife and was independent; working and enjoys Marketing executive. Pt currently performing ADLs and functional mobility at independent level. Pt presenting with slight deficits in problem solving and awareness as seen during cognition questions (including money management task). Providing education on impact of are of CVA, benefits of reducing screen time, and recommend taking time off work. Recommend dc to home and follow up with OP OT or SLP if cognitive symptoms persist. Will continue to follow acutely to assess cognition and facilitate safe dc.      Recommendations for follow up therapy are one component of a multi-disciplinary discharge planning process, led by the attending physician.  Recommendations may be updated based on patient status, additional functional criteria and insurance authorization.   Follow Up Recommendations  Other (comment) (Recommend that if symptoms persist, follow up with OP OT or SLP)    Equipment Recommendations  None recommended by OT    Recommendations for Other Services Speech consult     Precautions / Restrictions Precautions Precautions: Fall      Mobility Bed Mobility Overal bed mobility: Independent                  Transfers Overall transfer level: Independent                    Balance Overall balance assessment: Independent                               Standardized Balance Assessment Standardized Balance Assessment : Dynamic Gait Index         ADL either performed or assessed with clinical judgement   ADL Overall ADL's :  Independent                                       General ADL Comments: Pt performing ADLs and functional mobility at Indepenedent level. Noting errors with word finding and problem solving. Educating pt on location of CVA and the impact this has on problem solving, awareness, memory, and processing.     Vision         Perception     Praxis      Pertinent Vitals/Pain Pain Assessment: No/denies pain     Hand Dominance Right   Extremity/Trunk Assessment Upper Extremity Assessment Upper Extremity Assessment: Overall WFL for tasks assessed   Lower Extremity Assessment Lower Extremity Assessment: Defer to PT evaluation   Cervical / Trunk Assessment Cervical / Trunk Assessment: Normal   Communication Communication Communication: No difficulties   Cognition Arousal/Alertness: Awake/alert Behavior During Therapy: WFL for tasks assessed/performed Overall Cognitive Status: Impaired/Different from baseline Area of Impairment: Problem solving                             Problem Solving: Requires verbal cues General Comments: Pt presenting WFL for basic comversation. Demonstrating good memory for recall of 3 words. Difficulty and requiring increased time for naming three animals that start with "  f"; requiring Min cues for thinking of third animal. Incorrect wiht money management question and requiring cues to recognize error.   General Comments  recommending pt take time off of work as he works regularly on the computer. educating on limiting screen time to around 30 minutes    Exercises     Shoulder Instructions      Home Living Family/patient expects to be discharged to:: Private residence Living Arrangements: Spouse/significant other Available Help at Discharge: Family Type of Home: House Home Access: Stairs to enter Technical brewer of Steps: 1 Entrance Stairs-Rails: None Home Layout: Two level Alternate Level Stairs-Number of Steps:  flight Alternate Level Stairs-Rails: Left Bathroom Shower/Tub: Occupational psychologist: Standard     Home Equipment: Shower seat          Prior Functioning/Environment Level of Independence: Independent        Comments: Works as a Insurance underwriter; still drives. Likes to golf and go to [employer]        OT Problem List: Decreased cognition      OT Treatment/Interventions: Cognitive remediation/compensation;Self-care/ADL training    OT Goals(Current goals can be found in the care plan section) Acute Rehab OT Goals Patient Stated Goal: to go home OT Goal Formulation: With patient Time For Goal Achievement: 08/11/21 Potential to Achieve Goals: Good  OT Frequency: Min 2X/week   Barriers to D/C:            Co-evaluation              AM-PAC OT "6 Clicks" Daily Activity     Outcome Measure Help from another person eating meals?: None Help from another person taking care of personal grooming?: None Help from another person toileting, which includes using toliet, bedpan, or urinal?: None Help from another person bathing (including washing, rinsing, drying)?: None Help from another person to put on and taking off regular upper body clothing?: None Help from another person to put on and taking off regular lower body clothing?: None 6 Click Score: 24   End of Session    Activity Tolerance: Patient tolerated treatment well Patient left: in bed;with call bell/phone within reach  OT Visit Diagnosis: Other symptoms and signs involving cognitive function                Time: 4944-9675 OT Time Calculation (min): 15 min Charges:  OT General Charges $OT Visit: 1 Visit OT Evaluation $OT Eval Low Complexity: 1 Low  Ashlee Player MSOT, OTR/L Acute Rehab Pager: [phone] Office: [place] 07/28/2021, 2:34 PM

## 2021-07-28 NOTE — TOC Initial Note (Signed)
Transition of Care [facility]) - Initial/Assessment Note    Patient Details  Name: Steve [name] MRN: [id number] Date of Birth: 1951/03/24  Transition of Care [facility]) CM/SW Contact:    Pollie Friar, RN Phone Number: 07/28/2021, 3:21 PM  Clinical Narrative:                 Patient is from home with spouse that can provide supervision as needed. Pt denies any DME or issues with home medications. Pt drives self and still works. No f/u per PT and no DME needs.  Pt has transportation home when medically ready.  TOC following.  Expected Discharge Plan: Home/Self Care Barriers to Discharge: Continued Medical Work up   Patient Goals and CMS Choice        Expected Discharge Plan and Services Expected Discharge Plan: Home/Self Care       Living arrangements for the past 2 months: Single Family Home                                      Prior Living Arrangements/Services Living arrangements for the past 2 months: Single Family Home Lives with:: Spouse Patient language and need for interpreter reviewed:: Yes Do you feel safe going back to the place where you live?: Yes        Care giver support system in place?: Yes (comment)   Criminal Activity/Legal Involvement Pertinent to Current Situation/Hospitalization: No - Comment as needed  Activities of Daily Living Home Assistive Devices/Equipment: None ADL Screening (condition at time of admission) Patient's cognitive ability adequate to safely complete daily activities?: Yes Is the patient deaf or have difficulty hearing?: No Does the patient have difficulty seeing, even when wearing glasses/contacts?: No Does the patient have difficulty concentrating, remembering, or making decisions?: No Patient able to express need for assistance with ADLs?: Yes Does the patient have difficulty dressing or bathing?: No Independently performs ADLs?: Yes (appropriate for developmental age) Does the patient have difficulty walking or  climbing stairs?: No Weakness of Legs: None Weakness of Arms/Hands: None  Permission Sought/Granted                  Emotional Assessment Appearance:: Appears stated age Attitude/Demeanor/Rapport: Engaged Affect (typically observed): Accepting Orientation: : Oriented to Self, Oriented to Place, Oriented to  Time, Oriented to Situation   Psych Involvement: No (comment)  Admission diagnosis:  TIA (transient ischemic attack) [G45.9] Patient Active Problem List   Diagnosis Date Noted   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 ([place]) 07/28/2021   COVID-19 11/01/2020   Malignant neoplasm of prostate ([place]) 05/03/2020   Vertigo, benign paroxysmal 12/29/2019   Prediabetes 08/22/2019   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   S/P reverse total shoulder arthroplasty, right 03/10/2019   Difficulty sleeping 02/06/2019   Intermediate stage nonexudative age-related macular degeneration of both eyes 07/30/2018   Hair thinning 08/06/2017   B12 deficiency 08/06/2017   Status post placement of implantable loop recorder 04/24/2016   Squamous cell carcinoma 03/09/2016   Patent foramen ovale with atrial septal aneurysm, s/p closure 6/19 12/08/2015   GERD without esophagitis 11/09/2015   History of TIA (transient ischemic attack) - Dr Posey Pronto 09/30/2015   History of esophageal stricture 04/20/2013   Vitamin D deficiency 06/07/2010   BPH (benign prostatic hyperplasia) - urology 06/07/2010   FIRST DEGREE ATRIOVENTRICULAR BLOCK 02/28/2009   History of colonic polyps 06/18/2008  Mixed hyperlipidemia 08/26/2007   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

## 2021-07-28 NOTE — Progress Notes (Signed)
TCD w/ bubble study completed.   Please see CV Proc for preliminary results.   Legrande Hao, RDMS, RVT  

## 2021-07-28 NOTE — Progress Notes (Addendum)
STROKE TEAM PROGRESS NOTE   ATTENDING NOTE: I reviewed above note and agree with the assessment and plan. Pt was seen and examined.   70 year old male with history of hyperlipidemia, OSA, stroke in 01/2014, loop recorder no A. fib, PFO closure in 2019, prostate cancer status post SEEDS, hemifacial spasm with Botox treatment following with Dr. Posey Pronto at [facility] admitted for word finding difficulty and left facial droop.    Patient had a stroke 01/2014 with right arm numbness weakness, MRI found small right semiovale infarct.  EF unremarkable.  Discharged on aspirin 81 and Zocor 40.  Had loop recorder from 2017-2020, no A. fib found.  Had a PFO closure in 03/2018, and 2D echo repeat in 09/2018 negative Echo bubble study.  Patient has stopped aspirin himself for the last 2 months, but decided to restart aspirin after he followed up with Dr. Posey Pronto neurologist.  But he just re-started aspirin yesterday.  On current admission MRI showed left frontal scattered infarcts.  MRA head and neck unremarkable.  LDL 104, A1c 5.6.  EF 60 to 65%.  TCD bubble study no PFO.  On exam, patient awake alert, orientated x3, no speech difficulty, no aphasia, follows simple commands.  Still has right facial droop and intermittent facial spasm.  Moving all extremities equally, sensation symmetrical.  Finger-to-nose intact.  Patient current stroke concerning for embolic pattern, etiology unclear.  Patient had previous loop recorder for 3+ years and no A. fib.  PFO closed in 2019, repeat TTE bubble negative.  Pan CT has ruled out systemic malignancy (pt does have prostate cancer).  Will recommend outpatient Zio patch for 2 to 4 weeks to rule out A. fib.  Continue aspirin 81 and Plavix 75 DAPT for 3 weeks and then aspirin alone.  Continue Lipitor 40.  Discussed with Dr. Kennon Rounds.   For detailed assessment and plan, please refer to above as I have made changes wherever appropriate.   Neurology will sign off. Please call with questions. Pt  will follow up with Dr. Posey Pronto at [facility] in about 4 weeks. Thanks for the consult.   Steve Hawking, MD PhD Stroke Neurology 07/28/2021 7:35 PM    INTERVAL HISTORY No one is at bedside at time of this exam.   Vitals:   07/28/21 0200 07/28/21 0500 07/28/21 0600 07/28/21 0700  BP: 122/76 119/81 121/76 136/86  Pulse: 64 65 (!) 57 64  Resp: 12 12 12 17   Temp:      TempSrc:      SpO2: 97% 97% 96% 96%   CBC:  Recent Labs  Lab 07/27/21 2022 07/27/21 2028 07/28/21 0330  WBC 6.1  --  5.4  NEUTROABS 3.5  --  3.1  HGB 15.8 15.6 14.9  HCT 47.4 46.0 43.6  MCV 94.8  --  92.4  PLT 162  --  419   Basic Metabolic Panel:  Recent Labs  Lab 07/27/21 2022 07/27/21 2028 07/28/21 0330  NA 138 140 139  K 3.5 3.5 4.5  CL 105 105 106  CO2 22  --  28  GLUCOSE 113* 110* 107*  BUN 16 17 14   CREATININE 0.97 1.00 1.06  CALCIUM 9.1  --  8.9  MG  --   --  2.3    Lipid Panel:  Recent Labs  Lab 07/28/21 0330  CHOL 166  TRIG 74  HDL 47  CHOLHDL 3.5  VLDL 15  LDLCALC 104*    HgbA1c:  Recent Labs  Lab 07/28/21 0330  HGBA1C 5.6  Urine Drug Screen: No results for input(s): LABOPIA, COCAINSCRNUR, LABBENZ, AMPHETMU, THCU, LABBARB in the last 168 hours.  Alcohol Level No results for input(s): ETH in the last 168 hours.  IMAGING past 24 hours MR ANGIO HEAD WO CONTRAST  Result Date: 07/27/2021 CLINICAL DATA:  Stroke code follow-up EXAM: MRI HEAD WITHOUT CONTRAST MRA HEAD WITHOUT CONTRAST MRA NECK WITHOUT CONTRAST TECHNIQUE: Multiplanar, multi-echo pulse sequences of the brain and surrounding structures were acquired without intravenous contrast. Angiographic images of the Circle of Willis were acquired using MRA technique without intravenous contrast. Angiographic images of the neck were acquired using MRA technique without intravenous contrast. Carotid stenosis measurements (when applicable) are obtained utilizing NASCET criteria, using the distal internal carotid diameter as the denominator.  COMPARISON:  03/18/2015 MRI/MRA done correlation is made with same day CT head FINDINGS: MRI HEAD FINDINGS Brain: Multiple areas of restricted diffusion, the largest of which is an area of posterior inferior left frontal cortex (series 2, image 29), additional punctate areas of restricted diffusion are seen in the anterior and posterior left frontal lobe (series 2, images 30-34 and 41) and left parietal lobe (series 2, images 37 and 38). No acute hemorrhage, hydrocephalus, extra-axial collection, mass, mass effect, or midline shift. Vascular: See MRA findings, below. Skull and upper cervical spine: Normal marrow signal. Sinuses/Orbits: No acute or significant finding. Other: Trace fluid in right mastoid air cells. MRA HEAD FINDINGS Anterior circulation: Both internal carotid arteries are patent to the termini, without stenosis or other abnormality. A1 segments patent. Normal anterior communicating artery. Anterior cerebral arteries are patent to their distal aspects. No M1 stenosis or occlusion. Normal MCA bifurcations. Distal MCA branches perfused and symmetric. Posterior circulation: Vertebral arteries widely patent to the vertebrobasilar junction without stenosis. Posterior inferior cerebral arteries patent bilaterally. Basilar patent to its distal aspect. Superior cerebral arteries patent bilaterally. PCAs well perfused to their distal aspects without stenosis. Patent right posterior communicating artery. Anatomic variants: None significant MRA NECK FINDINGS Aortic arch: Not included in the field of view. Right carotid system: No stenosis (greater than 50%), dissection, or occlusion in the imaged right carotid artery. Left carotid system: No stenosis (greater than 50%), dissection, or occlusion in the imaged left carotid artery. Vertebral arteries: Right dominant. No stenosis or occlusion in the imaged portions. Other: None IMPRESSION: 1. Acute infarcts in the left frontal and parietal lobes, with the largest  area involving the left posteroinferior frontal cortex. Multiple punctate infarcts in multiple vascular territories suggest embolic etiology. 2. No intracranial large vessel occlusion. 3. No hemodynamically significant stenosis in the neck. Imaging results were communicated on 07/27/2021 at 11:09 pm to provider [facility] via secure text paging. Electronically Signed   By: Merilyn Baba M.D.   On: 07/27/2021 23:10   MR ANGIO NECK WO CONTRAST  Result Date: 07/27/2021 CLINICAL DATA:  Stroke code follow-up EXAM: MRI HEAD WITHOUT CONTRAST MRA HEAD WITHOUT CONTRAST MRA NECK WITHOUT CONTRAST TECHNIQUE: Multiplanar, multi-echo pulse sequences of the brain and surrounding structures were acquired without intravenous contrast. Angiographic images of the Circle of Willis were acquired using MRA technique without intravenous contrast. Angiographic images of the neck were acquired using MRA technique without intravenous contrast. Carotid stenosis measurements (when applicable) are obtained utilizing NASCET criteria, using the distal internal carotid diameter as the denominator. COMPARISON:  03/18/2015 MRI/MRA done correlation is made with same day CT head FINDINGS: MRI HEAD FINDINGS Brain: Multiple areas of restricted diffusion, the largest of which is an area of posterior inferior left frontal cortex (  series 2, image 29), additional punctate areas of restricted diffusion are seen in the anterior and posterior left frontal lobe (series 2, images 30-34 and 41) and left parietal lobe (series 2, images 37 and 38). No acute hemorrhage, hydrocephalus, extra-axial collection, mass, mass effect, or midline shift. Vascular: See MRA findings, below. Skull and upper cervical spine: Normal marrow signal. Sinuses/Orbits: No acute or significant finding. Other: Trace fluid in right mastoid air cells. MRA HEAD FINDINGS Anterior circulation: Both internal carotid arteries are patent to the termini, without stenosis or other abnormality. A1  segments patent. Normal anterior communicating artery. Anterior cerebral arteries are patent to their distal aspects. No M1 stenosis or occlusion. Normal MCA bifurcations. Distal MCA branches perfused and symmetric. Posterior circulation: Vertebral arteries widely patent to the vertebrobasilar junction without stenosis. Posterior inferior cerebral arteries patent bilaterally. Basilar patent to its distal aspect. Superior cerebral arteries patent bilaterally. PCAs well perfused to their distal aspects without stenosis. Patent right posterior communicating artery. Anatomic variants: None significant MRA NECK FINDINGS Aortic arch: Not included in the field of view. Right carotid system: No stenosis (greater than 50%), dissection, or occlusion in the imaged right carotid artery. Left carotid system: No stenosis (greater than 50%), dissection, or occlusion in the imaged left carotid artery. Vertebral arteries: Right dominant. No stenosis or occlusion in the imaged portions. Other: None IMPRESSION: 1. Acute infarcts in the left frontal and parietal lobes, with the largest area involving the left posteroinferior frontal cortex. Multiple punctate infarcts in multiple vascular territories suggest embolic etiology. 2. No intracranial large vessel occlusion. 3. No hemodynamically significant stenosis in the neck. Imaging results were communicated on 07/27/2021 at 11:09 pm to provider [facility] via secure text paging. Electronically Signed   By: Merilyn Baba M.D.   On: 07/27/2021 23:10   MR BRAIN WO CONTRAST  Result Date: 07/27/2021 CLINICAL DATA:  Stroke code follow-up EXAM: MRI HEAD WITHOUT CONTRAST MRA HEAD WITHOUT CONTRAST MRA NECK WITHOUT CONTRAST TECHNIQUE: Multiplanar, multi-echo pulse sequences of the brain and surrounding structures were acquired without intravenous contrast. Angiographic images of the Circle of Willis were acquired using MRA technique without intravenous contrast. Angiographic images of the neck  were acquired using MRA technique without intravenous contrast. Carotid stenosis measurements (when applicable) are obtained utilizing NASCET criteria, using the distal internal carotid diameter as the denominator. COMPARISON:  03/18/2015 MRI/MRA done correlation is made with same day CT head FINDINGS: MRI HEAD FINDINGS Brain: Multiple areas of restricted diffusion, the largest of which is an area of posterior inferior left frontal cortex (series 2, image 29), additional punctate areas of restricted diffusion are seen in the anterior and posterior left frontal lobe (series 2, images 30-34 and 41) and left parietal lobe (series 2, images 37 and 38). No acute hemorrhage, hydrocephalus, extra-axial collection, mass, mass effect, or midline shift. Vascular: See MRA findings, below. Skull and upper cervical spine: Normal marrow signal. Sinuses/Orbits: No acute or significant finding. Other: Trace fluid in right mastoid air cells. MRA HEAD FINDINGS Anterior circulation: Both internal carotid arteries are patent to the termini, without stenosis or other abnormality. A1 segments patent. Normal anterior communicating artery. Anterior cerebral arteries are patent to their distal aspects. No M1 stenosis or occlusion. Normal MCA bifurcations. Distal MCA branches perfused and symmetric. Posterior circulation: Vertebral arteries widely patent to the vertebrobasilar junction without stenosis. Posterior inferior cerebral arteries patent bilaterally. Basilar patent to its distal aspect. Superior cerebral arteries patent bilaterally. PCAs well perfused to their distal aspects without stenosis. Patent right  posterior communicating artery. Anatomic variants: None significant MRA NECK FINDINGS Aortic arch: Not included in the field of view. Right carotid system: No stenosis (greater than 50%), dissection, or occlusion in the imaged right carotid artery. Left carotid system: No stenosis (greater than 50%), dissection, or occlusion in the  imaged left carotid artery. Vertebral arteries: Right dominant. No stenosis or occlusion in the imaged portions. Other: None IMPRESSION: 1. Acute infarcts in the left frontal and parietal lobes, with the largest area involving the left posteroinferior frontal cortex. Multiple punctate infarcts in multiple vascular territories suggest embolic etiology. 2. No intracranial large vessel occlusion. 3. No hemodynamically significant stenosis in the neck. Imaging results were communicated on 07/27/2021 at 11:09 pm to provider [facility] via secure text paging. Electronically Signed   By: Merilyn Baba M.D.   On: 07/27/2021 23:10   CT HEAD CODE STROKE WO CONTRAST  Result Date: 07/27/2021 CLINICAL DATA:  Code stroke. EXAM: CT HEAD WITHOUT CONTRAST TECHNIQUE: Contiguous axial images were obtained from the base of the skull through the vertex without intravenous contrast. COMPARISON:  None. FINDINGS: Brain: There is no acute intracranial hemorrhage, mass effect, or edema. Gray-white differentiation is preserved. Ventricles and sulci are normal in size and configuration. No extra-axial collection. Vascular: No definite hyperdense vessel. Mild intracranial atherosclerotic calcification at the skull base. Skull: Unremarkable. Sinuses/Orbits: Aerated.  Orbits are unremarkable. Other: Mastoid air cells are clear. ASPECTS ([place] Stroke Program Early CT Score) - Ganglionic level infarction (caudate, lentiform nuclei, internal capsule, insula, M1-M3 cortex): 7 - Supraganglionic infarction (M4-M6 cortex): 3 Total score (0-10 with 10 being normal): 10 IMPRESSION: There is no acute intracranial hemorrhage or evidence of acute infarction. ASPECT score is 10. These results were communicated to Dr. Theda Sers at 8:40 pm on 07/27/2021 by text page via the [facility] messaging system. Electronically Signed   By: Macy Mis M.D.   On: 07/27/2021 20:41    PHYSICAL EXAM  Mental Status: Patient is awake, alert, oriented to person, place,  month, year, and situation. Patient is able to give a clear and coherent history. Speech  fluent, intact comprehension and repetition. No signs of aphasia or neglect. Visual Fields are full. Pupils are equal, round, and reactive to light. EOMI without ptosis or diploplia.  Facial sensation is symmetric to temperature Facial movement is asymmetric, pt has history of right face hemispasm, and uses botox, .  Hearing is intact to voice. Uvula midline and palate elevates symmetrically. Shoulder shrug is symmetric. Tongue is midline without atrophy or fasciculations.  Tone is normal. Bulk is normal. 5/5 strength was present in all four extremities. Sensation is symmetric to light touch and temperature in the arms and legs. Deep Tendon Reflexes: 2+ and symmetric in the biceps and patellae. Toes are downgoing bilaterally. FNF and HKS are intact bilaterally. Gait - Deferred  ASSESSMENT/PLAN Steve [name] is a 70 y.o. male with history of   gastroesophageal reflux disease, hyperlipidemia, prostate cancer (follows with Dr. Diona Fanti S/P Brachytherapy in 09/2020), previous left semiovale stroke in 2016 felt to be cryptogenic with identified PFO, TIA who presents to [facility] emergency department with aphasia via EMS as a code stroke.   Last known well was approximately 8 AM when the patient began to have difficulty speaking.  He describes it as "lots of stuttering."    This difficulty with finding and speaking words continued to persist throughout the day for several hours.  Family also reports an associated right  facial droop however patient reports  that this is chronic due to a history of receiving Botox injections around the right eyelid.  By the afternoon, the family contacted the patient's outpatient neurologist who upon hearing about the patient's symptoms advised the patient immediately go to the emergency department for evaluation.  There was no associated focal weakness, loss  of balance confusion, visual change or headache.   EMS was then contacted and the patient was promptly brought into [facility] emergency department for evaluation.   Patient denies any symptoms of cancer. No weight loss, no taste abnormalities.   Stroke :  left  frontal and parietal lobes infarct embolic, etiology unclear.   MRI  Brain: Acute infarcts in the left frontal and parietal lobes, with the largest area involving the left posteroinferior frontal cortex. Multiple punctate infarcts in multiple vascular territories suggest embolic etiology.  MRA Head and Neck:  No intracranial large vessel occlusion. 3No hemodynamically significant stenosis in the neck. 2D Echo pending    LDL 104 HgbA1c 5.6 VTE prophylaxis - scd     Diet   Diet Heart Room service appropriate? Yes; Fluid consistency: Thin   aspirin 81 mg daily prior to admission, now on aspirin 81 mg daily and clopidogrel 75 mg daily.   Therapy recommendations:  pending Disposition:  pending   Hypertension Permissive hypertension (OK if < 220/120) but gradually normalize in 5-7 days Long-term BP goal normotensive  Hyperlipidemia Home meds:  zocor 40mg ,  resumed in hospital LDL 104, goal < 70 Add    High intensity statin   Continue statin at discharge   HgbA1c 5.6, goal < 7.0 CBGs Recent Labs    07/27/21 2023  GLUCAP 117*    SSI  Other Stroke Risk Factors  Advanced Age >/= 44   Hx stroke/TIA History of cancer    Other Active Problems  hyperlipidemia Continuing home regimen of lipid lowering therapy. We will titrate regimen or switch to a higher intensity statin if LDL is greater than 70     Patent foramen ovale with atrial septal aneurysm, s/p closure 6/19 Known history of PFO since stroke in 2016 s/p closure 03/2018 Will reassess this via echocardiogram tomorrow morning with bubble study   GERD without esophagitis Continuing home regimen of daily PPI therapy.     Malignant neoplasm of  prostate ([place]) Continue [place] Outpatient follow-up with urology      Hospital day # 0     To contact Stroke Continuity provider, please refer to [email or web address] After hours, contact General Neurology

## 2021-07-28 NOTE — Evaluation (Signed)
Physical Therapy Evaluation and Discharge Patient Details Name: Steve [name] MRN: [id number] DOB: Sep 07, 1951 Today's Date: 07/28/2021  History of Present Illness  Pt is a 70 y/o male admitted 10/20 secondary to expressive difficulty and L facial droop. 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 PMH includes CVA and prostate cancer.  Clinical Impression  Patient evaluated by Physical Therapy with no further acute PT needs identified. All education has been completed and the patient has no further questions. Pt overall steady with mobility tasks and at an independent level. Scored 24 on DGI indicating low fall risk. See OT note for more formal cog assessment. See below for any follow-up Physical Therapy or equipment needs. PT is signing off. Thank you for this referral. If needs change, please re-consult.         Recommendations for follow up therapy are one component of a multi-disciplinary discharge planning process, led by the attending physician.  Recommendations may be updated based on patient status, additional functional criteria and insurance authorization.  Follow Up Recommendations No PT follow up    Equipment Recommendations  None recommended by PT    Recommendations for Other Services       Precautions / Restrictions Precautions Precautions: Fall Restrictions Weight Bearing Restrictions: No      Mobility  Bed Mobility Overal bed mobility: Independent                  Transfers Overall transfer level: Independent                  Ambulation/Gait Ambulation/Gait assistance: Independent Gait Distance (Feet): 150 Feet Assistive device: None Gait Pattern/deviations: WFL(Within Functional Limits) Gait velocity: WFL   General Gait Details: Overall independent, with good gait speed. Able to perform DGI tasks without LOB  Stairs Stairs: Yes Stairs assistance: Supervision Stair Management: No rails;Step to pattern Number of  Stairs: 5 General stair comments: Simulated stair navigation using step stool. Pt did not require UE support and no LOB noted.  Wheelchair Mobility    Modified Rankin (Stroke Patients Only) Modified Rankin (Stroke Patients Only) Pre-Morbid Rankin Score: No symptoms Modified Rankin: No significant disability     Balance Overall balance assessment: Independent                               Standardized Balance Assessment Standardized Balance Assessment : Dynamic Gait Index   Dynamic Gait Index Level Surface: Normal Change in Gait Speed: Normal Gait with Horizontal Head Turns: Normal Gait with Vertical Head Turns: Normal Gait and Pivot Turn: Normal Step Over Obstacle: Normal Step Around Obstacles: Normal Steps: Normal Total Score: 24       Pertinent Vitals/Pain Pain Assessment: No/denies pain    Home Living Family/patient expects to be discharged to:: Private residence Living Arrangements: Spouse/significant other Available Help at Discharge: Family Type of Home: House Home Access: Stairs to enter Entrance Stairs-Rails: None Technical brewer of Steps: 1 Home Layout: Two level Home Equipment: Shower seat      Prior Function Level of Independence: Independent         Comments: Works as a Insurance underwriter; still drives. Likes to golf and go to the [employer]        Extremity/Trunk Assessment   Upper Extremity Assessment Upper Extremity Assessment: Defer to OT evaluation    Lower Extremity Assessment Lower Extremity Assessment: Overall WFL for tasks assessed  Cervical / Trunk Assessment Cervical / Trunk Assessment: Normal  Communication   Communication: No difficulties  Cognition Arousal/Alertness: Awake/alert Behavior During Therapy: WFL for tasks assessed/performed Overall Cognitive Status: Within Functional Limits for tasks assessed                                 General Comments: See OT note  for more formal assessment of cognition      General Comments      Exercises     Assessment/Plan    PT Assessment Patent does not need any further PT services  PT Problem List         PT Treatment Interventions      PT Goals (Current goals can be found in the Care Plan section)  Acute Rehab PT Goals Patient Stated Goal: to go home PT Goal Formulation: With patient Time For Goal Achievement: 07/28/21 Potential to Achieve Goals: Good    Frequency     Barriers to discharge        Co-evaluation               AM-PAC PT "6 Clicks" Mobility  Outcome Measure Help needed turning from your back to your side while in a flat bed without using bedrails?: None Help needed moving from lying on your back to sitting on the side of a flat bed without using bedrails?: None Help needed moving to and from a bed to a chair (including a wheelchair)?: None Help needed standing up from a chair using your arms (e.g., wheelchair or bedside chair)?: None Help needed to walk in hospital room?: None Help needed climbing 3-5 steps with a railing? : None 6 Click Score: 24    End of Session Equipment Utilized During Treatment: Gait belt Activity Tolerance: Patient tolerated treatment well Patient left: in bed;with call bell/phone within reach (on stretcher in ED) Nurse Communication: Mobility status PT Visit Diagnosis: Other symptoms and signs involving the nervous system (B70.488)    Time: 8916-9450 PT Time Calculation (min) (ACUTE ONLY): 14 min   Charges:   PT Evaluation $PT Eval Low Complexity: 1 Low          Steve [name], Steve [name]  Acute Rehabilitation Services  Pager: [phone] Office: [phone]   Steve [name] 07/28/2021, 11:07 AM

## 2021-07-28 NOTE — Telephone Encounter (Signed)
Patient was instructed to go to ER. Encounter for [employer]

## 2021-07-28 NOTE — Assessment & Plan Note (Signed)
   Known history of PFO since stroke in 2016 s/p closure 03/2018  Will reassess this via echocardiogram tomorrow morning with bubble study

## 2021-07-28 NOTE — Assessment & Plan Note (Signed)
   Continue Flomax  Outpatient follow-up with urology

## 2021-07-29 ENCOUNTER — Other Ambulatory Visit: Payer: Self-pay | Admitting: Cardiology

## 2021-07-29 DIAGNOSIS — G459 Transient cerebral ischemic attack, unspecified: Secondary | ICD-10-CM

## 2021-07-29 DIAGNOSIS — I63512 Cerebral infarction due to unspecified occlusion or stenosis of left middle cerebral artery: Secondary | ICD-10-CM | POA: Diagnosis not present

## 2021-07-29 DIAGNOSIS — C61 Malignant neoplasm of prostate: Secondary | ICD-10-CM | POA: Diagnosis not present

## 2021-07-29 MED ORDER — CLOPIDOGREL BISULFATE 75 MG PO TABS: 75.0000 mg | ORAL_TABLET | Freq: Every day | ORAL | 0 refills | Status: AC

## 2021-07-29 MED ORDER — SIMVASTATIN 40 MG PO TABS: 40.0000 mg | ORAL_TABLET | Freq: Every day | ORAL | 3 refills | Status: DC

## 2021-07-29 NOTE — Progress Notes (Signed)
Cardiac monitor placed for TIA. PCP cardiologist Dr. Sallyanne Kuster to read.

## 2021-07-29 NOTE — Plan of Care (Signed)
  Problem: Education: Goal: Knowledge of General Education information will improve Description: Including pain rating scale, medication(s)/side effects and non-pharmacologic comfort measures Outcome: Adequate for Discharge   Problem: Health Behavior/Discharge Planning: Goal: Ability to manage health-related needs will improve Outcome: Adequate for Discharge   Problem: Clinical Measurements: Goal: Ability to maintain clinical measurements within normal limits will improve Outcome: Adequate for Discharge Goal: Will remain free from infection Outcome: Adequate for Discharge Goal: Diagnostic test results will improve Outcome: Adequate for Discharge Goal: Respiratory complications will improve Outcome: Adequate for Discharge Goal: Cardiovascular complication will be avoided Outcome: Adequate for Discharge   Problem: Activity: Goal: Risk for activity intolerance will decrease Outcome: Adequate for Discharge   Problem: Nutrition: Goal: Adequate nutrition will be maintained Outcome: Adequate for Discharge   Problem: Coping: Goal: Level of anxiety will decrease Outcome: Adequate for Discharge   Problem: Elimination: Goal: Will not experience complications related to bowel motility Outcome: Adequate for Discharge Goal: Will not experience complications related to urinary retention Outcome: Adequate for Discharge   Problem: Pain Managment: Goal: General experience of comfort will improve Outcome: Adequate for Discharge   Problem: Safety: Goal: Ability to remain free from injury will improve Outcome: Adequate for Discharge   Problem: Skin Integrity: Goal: Risk for impaired skin integrity will decrease Outcome: Adequate for Discharge   Problem: Education: Goal: Knowledge of disease or condition will improve Outcome: Adequate for Discharge Goal: Knowledge of secondary prevention will improve (SELECT ALL) Outcome: Adequate for Discharge Goal: Knowledge of patient specific  risk factors will improve (INDIVIDUALIZE FOR PATIENT) Outcome: Adequate for Discharge   Problem: Health Behavior/Discharge Planning: Goal: Ability to manage health-related needs will improve Outcome: Adequate for Discharge   Problem: Self-Care: Goal: Ability to participate in self-care as condition permits will improve Outcome: Adequate for Discharge   Problem: Ischemic Stroke/TIA Tissue Perfusion: Goal: Complications of ischemic stroke/TIA will be minimized Outcome: Adequate for Discharge

## 2021-07-29 NOTE — Progress Notes (Signed)
Patient discharge home with family transport, discharge summary report reviewed and given to pt. Pt informed that cardiology with mail his Zio patch.

## 2021-07-29 NOTE — Discharge Summary (Signed)
Physician Discharge Summary  Steve [name] CBJ:[id number] DOB: 1950/12/21 DOA: 07/27/2021  PCP: Steve [name]  Admit date: 07/27/2021 Discharge date: 07/29/2021  Admitted From: Home Disposition:  home  Recommendations for Outpatient Follow-up:  Follow up with PCP in 1-2 weeks Please obtain BMP/CBC in one week   Home Health:No Equipment/Devices:None  Discharge Condition:Stable CODE STATUS:Full Diet recommendation: Heart Healthy   Brief/Interim Summary: 70 y.o. male past medical history of GERD prostate cancer status post brachytherapy on 09/27/2021, stroke in 2016 felt to be cryptogenic secondary to PFO which subsequently was closed with an event recorder in place for the last 3 years with no events brought into the ED after strokelike symptoms.  CT of the head was negative for acute intracranial bleeding, MRI of the brain revealed infarct of the left frontal and parietal lobe which were punctuated and multiple vascular territories.  CTA showed no large vessel occlusion.  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 CT scan of the chest abdomen pelvis showed no acute intrathoracic, abdominal or pelvic processes, showed distal diverticulosis. Neurology was consulted recommended to continue aspirin and Plavix for 3 weeks then only aspirin they also recommended a Zio patch to rule out A. fib. Physical therapy evaluated the patient recommended no home health PT.  Mixed dyslipidemia:   continue statin target LDL less than 70.  GERD: Continue PPI.  Malignant neoplasm of the prostate, Continue Flomax follow-up with urology as an outpatient.   Discharge Instructions  Discharge  Instructions     Ambulatory referral to Neurology   Complete by: As directed    Follow up with Steve [name] at [facility] in 4-6 weeks.  Patient is Steve [name] patient.  Thanks.   Diet - low sodium heart healthy   Complete by: As directed    Increase activity slowly   Complete by: As directed       Allergies as of 07/29/2021       Reactions   Pollen Extract Itching   Eyes itch and water        Medication List     STOP taking these medications    ibuprofen 800 MG tablet Commonly known as: ADVIL       TAKE these medications    aspirin 81 MG tablet Take 81 mg by mouth daily.   Botox 100 units Solr injection Generic drug: botulinum toxin Type A Inject 100 Units into the muscle every 3 (three) months.   cholecalciferol 25 MCG (1000 UNIT) tablet Commonly known as: VITAMIN D3 Take 1,000 Units by mouth daily.   clopidogrel 75 MG tablet Commonly known as: PLAVIX Take 1 tablet (75 mg total) by mouth daily for 20 days.   fluticasone 50 MCG/ACT nasal spray Commonly known as: FLONASE ONE SPRAY IN EACH NOSTRIL TWICE DAILY ASNEEDED -USE CROSSOVER TECHNIQUE DISCUSSED What changed: See the new instructions.   pantoprazole 40 MG tablet Commonly known as: PROTONIX TAKE ONE TABLET DAILY   PreserVision AREDS 2 Caps Take 1 capsule by mouth 2 (two) times a day.   simvastatin 40 MG tablet Commonly known as: ZOCOR Take 1 tablet (40 mg total) by mouth at bedtime.   tamsulosin 0.4 MG Caps capsule Commonly known as: FLOMAX  Take 0.4 mg by mouth daily.   vardenafil 20 MG tablet Commonly known as: LEVITRA Take 20 mg by mouth daily as needed for erectile dysfunction.   vitamin B-12 1000 MCG tablet Commonly known as: CYANOCOBALAMIN Take 1,000 mcg by mouth daily.   vitamin C 500 MG tablet Commonly known as: ASCORBIC ACID Take 500 mg by mouth daily.        Follow-up Information     Steve Berthold, DO. Schedule an appointment as soon as possible for a visit in 1 month(s).    Specialty: Neurology Contact information: [address] [place] [id number] [phone]                Allergies  Allergen Reactions   Pollen Extract Itching    Eyes itch and water    Consultations: Neurology   Procedures/Studies: MR ANGIO HEAD WO CONTRAST  Result Date: 07/27/2021 CLINICAL DATA:  Stroke code follow-up EXAM: MRI HEAD WITHOUT CONTRAST MRA HEAD WITHOUT CONTRAST MRA NECK WITHOUT CONTRAST TECHNIQUE: Multiplanar, multi-echo pulse sequences of the brain and surrounding structures were acquired without intravenous contrast. Angiographic images of the Circle of Willis were acquired using MRA technique without intravenous contrast. Angiographic images of the neck were acquired using MRA technique without intravenous contrast. Carotid stenosis measurements (when applicable) are obtained utilizing NASCET criteria, using the distal internal carotid diameter as the denominator. COMPARISON:  03/18/2015 MRI/MRA done correlation is made with same day CT head FINDINGS: MRI HEAD FINDINGS Brain: Multiple areas of restricted diffusion, the largest of which is an area of posterior inferior left frontal cortex (series 2, image 29), additional punctate areas of restricted diffusion are seen in the anterior and posterior left frontal lobe (series 2, images 30-34 and 41) and left parietal lobe (series 2, images 37 and 38). No acute hemorrhage, hydrocephalus, extra-axial collection, mass, mass effect, or midline shift. Vascular: See MRA findings, below. Skull and upper cervical spine: Normal marrow signal. Sinuses/Orbits: No acute or significant finding. Other: Trace fluid in right mastoid air cells. MRA HEAD FINDINGS Anterior circulation: Both internal carotid arteries are patent to the termini, without stenosis or other abnormality. A1 segments patent. Normal anterior communicating artery. Anterior cerebral arteries are patent to their distal aspects. No M1 stenosis or  occlusion. Normal MCA bifurcations. Distal MCA branches perfused and symmetric. Posterior circulation: Vertebral arteries widely patent to the vertebrobasilar junction without stenosis. Posterior inferior cerebral arteries patent bilaterally. Basilar patent to its distal aspect. Superior cerebral arteries patent bilaterally. PCAs well perfused to their distal aspects without stenosis. Patent right posterior communicating artery. Anatomic variants: None significant MRA NECK FINDINGS Aortic arch: Not included in the field of view. Right carotid system: No stenosis (greater than 50%), dissection, or occlusion in the imaged right carotid artery. Left carotid system: No stenosis (greater than 50%), dissection, or occlusion in the imaged left carotid artery. Vertebral arteries: Right dominant. No stenosis or occlusion in the imaged portions. Other: None IMPRESSION: 1. Acute infarcts in the left frontal and parietal lobes, with the largest area involving the left posteroinferior frontal cortex. Multiple punctate infarcts in multiple vascular territories suggest embolic etiology. 2. No intracranial large vessel occlusion. 3. No hemodynamically significant stenosis in the neck. Imaging results were communicated on 07/27/2021 at 11:09 pm to provider [facility] via secure text paging. Electronically Signed   By: Merilyn Baba M.D.   On: 07/27/2021 23:10   MR ANGIO NECK WO CONTRAST  Result Date: 07/27/2021 CLINICAL DATA:  Stroke code follow-up EXAM:  MRI HEAD WITHOUT CONTRAST MRA HEAD WITHOUT CONTRAST MRA NECK WITHOUT CONTRAST TECHNIQUE: Multiplanar, multi-echo pulse sequences of the brain and surrounding structures were acquired without intravenous contrast. Angiographic images of the Circle of Willis were acquired using MRA technique without intravenous contrast. Angiographic images of the neck were acquired using MRA technique without intravenous contrast. Carotid stenosis measurements (when applicable) are obtained  utilizing NASCET criteria, using the distal internal carotid diameter as the denominator. COMPARISON:  03/18/2015 MRI/MRA done correlation is made with same day CT head FINDINGS: MRI HEAD FINDINGS Brain: Multiple areas of restricted diffusion, the largest of which is an area of posterior inferior left frontal cortex (series 2, image 29), additional punctate areas of restricted diffusion are seen in the anterior and posterior left frontal lobe (series 2, images 30-34 and 41) and left parietal lobe (series 2, images 37 and 38). No acute hemorrhage, hydrocephalus, extra-axial collection, mass, mass effect, or midline shift. Vascular: See MRA findings, below. Skull and upper cervical spine: Normal marrow signal. Sinuses/Orbits: No acute or significant finding. Other: Trace fluid in right mastoid air cells. MRA HEAD FINDINGS Anterior circulation: Both internal carotid arteries are patent to the termini, without stenosis or other abnormality. A1 segments patent. Normal anterior communicating artery. Anterior cerebral arteries are patent to their distal aspects. No M1 stenosis or occlusion. Normal MCA bifurcations. Distal MCA branches perfused and symmetric. Posterior circulation: Vertebral arteries widely patent to the vertebrobasilar junction without stenosis. Posterior inferior cerebral arteries patent bilaterally. Basilar patent to its distal aspect. Superior cerebral arteries patent bilaterally. PCAs well perfused to their distal aspects without stenosis. Patent right posterior communicating artery. Anatomic variants: None significant MRA NECK FINDINGS Aortic arch: Not included in the field of view. Right carotid system: No stenosis (greater than 50%), dissection, or occlusion in the imaged right carotid artery. Left carotid system: No stenosis (greater than 50%), dissection, or occlusion in the imaged left carotid artery. Vertebral arteries: Right dominant. No stenosis or occlusion in the imaged portions. Other: None  IMPRESSION: 1. Acute infarcts in the left frontal and parietal lobes, with the largest area involving the left posteroinferior frontal cortex. Multiple punctate infarcts in multiple vascular territories suggest embolic etiology. 2. No intracranial large vessel occlusion. 3. No hemodynamically significant stenosis in the neck. Imaging results were communicated on 07/27/2021 at 11:09 pm to provider [facility] via secure text paging. Electronically Signed   By: Merilyn Baba M.D.   On: 07/27/2021 23:10   MR BRAIN WO CONTRAST  Result Date: 07/27/2021 CLINICAL DATA:  Stroke code follow-up EXAM: MRI HEAD WITHOUT CONTRAST MRA HEAD WITHOUT CONTRAST MRA NECK WITHOUT CONTRAST TECHNIQUE: Multiplanar, multi-echo pulse sequences of the brain and surrounding structures were acquired without intravenous contrast. Angiographic images of the Circle of Willis were acquired using MRA technique without intravenous contrast. Angiographic images of the neck were acquired using MRA technique without intravenous contrast. Carotid stenosis measurements (when applicable) are obtained utilizing NASCET criteria, using the distal internal carotid diameter as the denominator. COMPARISON:  03/18/2015 MRI/MRA done correlation is made with same day CT head FINDINGS: MRI HEAD FINDINGS Brain: Multiple areas of restricted diffusion, the largest of which is an area of posterior inferior left frontal cortex (series 2, image 29), additional punctate areas of restricted diffusion are seen in the anterior and posterior left frontal lobe (series 2, images 30-34 and 41) and left parietal lobe (series 2, images 37 and 38). No acute hemorrhage, hydrocephalus, extra-axial collection, mass, mass effect, or midline shift. Vascular: See MRA findings, below.  Skull and upper cervical spine: Normal marrow signal. Sinuses/Orbits: No acute or significant finding. Other: Trace fluid in right mastoid air cells. MRA HEAD FINDINGS Anterior circulation: Both internal  carotid arteries are patent to the termini, without stenosis or other abnormality. A1 segments patent. Normal anterior communicating artery. Anterior cerebral arteries are patent to their distal aspects. No M1 stenosis or occlusion. Normal MCA bifurcations. Distal MCA branches perfused and symmetric. Posterior circulation: Vertebral arteries widely patent to the vertebrobasilar junction without stenosis. Posterior inferior cerebral arteries patent bilaterally. Basilar patent to its distal aspect. Superior cerebral arteries patent bilaterally. PCAs well perfused to their distal aspects without stenosis. Patent right posterior communicating artery. Anatomic variants: None significant MRA NECK FINDINGS Aortic arch: Not included in the field of view. Right carotid system: No stenosis (greater than 50%), dissection, or occlusion in the imaged right carotid artery. Left carotid system: No stenosis (greater than 50%), dissection, or occlusion in the imaged left carotid artery. Vertebral arteries: Right dominant. No stenosis or occlusion in the imaged portions. Other: None IMPRESSION: 1. Acute infarcts in the left frontal and parietal lobes, with the largest area involving the left posteroinferior frontal cortex. Multiple punctate infarcts in multiple vascular territories suggest embolic etiology. 2. No intracranial large vessel occlusion. 3. No hemodynamically significant stenosis in the neck. Imaging results were communicated on 07/27/2021 at 11:09 pm to provider [facility] via secure text paging. Electronically Signed   By: Merilyn Baba M.D.   On: 07/27/2021 23:10   CT CHEST ABDOMEN PELVIS W CONTRAST  Result Date: 07/28/2021 CLINICAL DATA:  Prostate cancer, diverticulitis, embolic stroke EXAM: CT CHEST, ABDOMEN, AND PELVIS WITH CONTRAST TECHNIQUE: Multidetector CT imaging of the chest, abdomen and pelvis was performed following the standard protocol during bolus administration of intravenous contrast. CONTRAST:  115mL  OMNIPAQUE IOHEXOL 350 MG/ML SOLN COMPARISON:  01/25/2021 FINDINGS: CT CHEST FINDINGS Cardiovascular: ASD occlusion device identified within the interatrial septum. Heart is otherwise unremarkable without pericardial effusion. No evidence of thoracic aortic aneurysm or dissection. While not optimized for evaluation of the pulmonary vasculature, I do not identify any filling defects or pulmonary emboli on this exam. Mediastinum/Nodes: No enlarged mediastinal, hilar, or axillary lymph nodes. Thyroid gland, trachea, and esophagus demonstrate no significant findings. Small hiatal hernia. Lungs/Pleura: No acute airspace disease, effusion, or pneumothorax. Central airways are patent. Musculoskeletal: No acute or destructive bony lesions. Right shoulder arthroplasty is identified. Reconstructed images demonstrate no additional findings. CT ABDOMEN PELVIS FINDINGS Hepatobiliary: Mild diffuse hepatic steatosis. No focal liver abnormality. No intrahepatic duct dilation. Gallbladder is unremarkable. Pancreas: Unremarkable. No pancreatic ductal dilatation or surrounding inflammatory changes. Spleen: Normal in size without focal abnormality. Adrenals/Urinary Tract: Adrenal glands are unremarkable. Kidneys are normal, without renal calculi, focal lesion, or hydronephrosis. Bladder is unremarkable. Stomach/Bowel: No bowel obstruction or ileus. Normal appendix right lower quadrant. Diverticulosis of the descending and sigmoid colon without diverticulitis. No bowel wall thickening or inflammatory change. Vascular/Lymphatic: Aortic atherosclerosis. No enlarged abdominal or pelvic lymph nodes. Reproductive: Brachytherapy seeds are seen within the prostate bed, stable. No focal abnormality. Other: No free fluid or free intraperitoneal gas. No abdominal wall hernia. Musculoskeletal: No acute or destructive bony lesions. Reconstructed images demonstrate no additional findings. IMPRESSION: 1. No acute intrathoracic, intra-abdominal, or  intrapelvic process. 2. Small hiatal hernia. 3. Hepatic steatosis. 4. Distal colonic diverticulosis without diverticulitis. 5.  Aortic Atherosclerosis (ICD10-I70.0). 6. Amplatzer occlusion device within the interatrial septum. Electronically Signed   By: Randa Ngo M.D.   On: 07/28/2021 19:56  VAS [place] TRANSCRANIAL DOPPLER W BUBBLES  Result Date: 07/28/2021  Transcranial Doppler with Bubble Patient Name:  Steve [name]  Date of Exam:   07/28/2021 Medical Rec #: [id number]           Accession #:    [id number] Date of Birth: 09-19-1951            Patient Gender: M Patient Age:   39 years Exam Location:  [facility] Procedure:      VAS [place] TRANSCRANIAL DOPPLER W BUBBLES Referring Phys: Cornelius Moras XU --------------------------------------------------------------------------------  Indications: Embolic stroke, s/p PFO closure. History: 03-27-2018 Patent foramen ovale closure. 03-27-2018 Limited echo: The device appears well-seated, no evidence for flow across the device. 05-12-2019 Echo complete: PFO closure device in place. No obvious shunting by color doppler.  Performing Technologist: Darlin Coco RDMS, RVT  Examination Guidelines: A complete evaluation includes B-mode imaging, spectral Doppler, color Doppler, and power Doppler as needed of all accessible portions of each vessel. Bilateral testing is considered an integral part of a complete examination. Limited examinations for reoccurring indications may be performed as noted.  Summary: No HITS at rest or during Valsalva. Negative transcranial Doppler Bubble study with no evidence of right to left intracardiac communication.  A vascular evaluation was performed. The right carotid siphon was studied. An IV was inserted into the patient's left antecubital. Verbal informed consent was obtained.  *See table(s) above for TCD measurements and observations.    Preliminary    ECHOCARDIOGRAM COMPLETE  Result Date: 07/28/2021    ECHOCARDIOGRAM REPORT    Patient Name:   Steve [name] Date of Exam: 07/28/2021 Medical Rec #:  [id number]          Height:       75.0 in Accession #:    [id number]         Weight:       199.0 lb Date of Birth:  [birth year]           BSA:          2.190 m Patient Age:    51 years           BP:           131/82 mmHg Patient Gender: M                  HR:           74 bpm. Exam Location:  Inpatient Procedure: 2D Echo                              MODIFIED REPORT: This report was modified by Kirk Ruths [name] on 07/28/2021 due to Change.  Indications:     stroke  History:         Patient has prior history of Echocardiogram examinations, most                  recent 05/12/2019. Risk Factors:Dyslipidemia. Septal Repair:PFO                  closure on 03/27/18.  Sonographer:     [name] RDCS Referring Phys:  [id number] [place] Diagnosing Phys: Kirk Ruths [name] IMPRESSIONS  1. PFO closure device noted; no obvious residual shunt.  2. Left ventricular ejection fraction, by estimation, is 60 to 65%. The left ventricle has normal function. The left ventricle has no regional wall motion abnormalities. Left ventricular diastolic parameters are consistent with  Grade I diastolic dysfunction (impaired relaxation).  3. Right ventricular systolic function is normal. The right ventricular size is normal.  4. The mitral valve is normal in structure. Trivial mitral valve regurgitation. No evidence of mitral stenosis.  5. The aortic valve is normal in structure. Aortic valve regurgitation is not visualized. No aortic stenosis is present.  6. The inferior vena cava is normal in size with greater than 50% respiratory variability, suggesting right atrial pressure of 3 mmHg. FINDINGS  Left Ventricle: Left ventricular ejection fraction, by estimation, is 60 to 65%. The left ventricle has normal function. The left ventricle has no regional wall motion abnormalities. The left ventricular internal cavity size was normal in size. There is  no left  ventricular hypertrophy. Left ventricular diastolic parameters are consistent with Grade I diastolic dysfunction (impaired relaxation). Right Ventricle: The right ventricular size is normal. Right ventricular systolic function is normal. Left Atrium: Left atrial size was normal in size. Right Atrium: Right atrial size was normal in size. Pericardium: There is no evidence of pericardial effusion. Mitral Valve: The mitral valve is normal in structure. Trivial mitral valve regurgitation. No evidence of mitral valve stenosis. Tricuspid Valve: The tricuspid valve is normal in structure. Tricuspid valve regurgitation is trivial. No evidence of tricuspid stenosis. Aortic Valve: The aortic valve is normal in structure. Aortic valve regurgitation is not visualized. No aortic stenosis is present. Pulmonic Valve: The pulmonic valve was normal in structure. Pulmonic valve regurgitation is not visualized. No evidence of pulmonic stenosis. Aorta: The aortic root is normal in size and structure. Venous: The inferior vena cava is normal in size with greater than 50% respiratory variability, suggesting right atrial pressure of 3 mmHg. IAS/Shunts: No atrial level shunt detected by color flow Doppler. Additional Comments: PFO closure device noted; no obvious residual shunt.  LEFT VENTRICLE PLAX 2D LVIDd:         3.80 cm Diastology LVIDs:         2.60 cm LV e' medial:    6.31 cm/s LV PW:         1.10 cm LV E/e' medial:  9.9 LV IVS:        1.10 cm LV e' lateral:   9.57 cm/s                        LV E/e' lateral: 6.5  RIGHT VENTRICLE             IVC RV S prime:     12.80 cm/s  IVC diam: 1.70 cm TAPSE (M-mode): 2.1 cm LEFT ATRIUM             Index        RIGHT ATRIUM           Index LA diam:        3.40 cm 1.55 cm/m   RA Area:     14.60 cm LA Vol (A2C):   50.0 ml 22.83 ml/m  RA Volume:   35.50 ml  16.21 ml/m LA Vol (A4C):   47.5 ml 21.69 ml/m LA Biplane Vol: 48.9 ml 22.33 ml/m  AORTIC VALVE LVOT Vmax:   98.50 cm/s LVOT Vmean:   65.600 cm/s LVOT VTI:    0.189 m  AORTA Ao Asc diam: 3.20 cm MITRAL VALVE MV Area (PHT): 2.99 cm    SHUNTS MV Decel Time: 254 msec    Systemic VTI: 0.19 m MV E velocity: 62.60 cm/s MV A velocity: 84.40 cm/s MV E/A  ratio:  0.74 Kirk Ruths [name] Electronically signed by Kirk Ruths [name] Signature Date/Time: 07/28/2021/6:10:16 PM    Final (Updated)    CT HEAD CODE STROKE WO CONTRAST  Result Date: 07/27/2021 CLINICAL DATA:  Code stroke. EXAM: CT HEAD WITHOUT CONTRAST TECHNIQUE: Contiguous axial images were obtained from the base of the skull through the vertex without intravenous contrast. COMPARISON:  None. FINDINGS: Brain: There is no acute intracranial hemorrhage, mass effect, or edema. Gray-white differentiation is preserved. Ventricles and sulci are normal in size and configuration. No extra-axial collection. Vascular: No definite hyperdense vessel. Mild intracranial atherosclerotic calcification at the skull base. Skull: Unremarkable. Sinuses/Orbits: Aerated.  Orbits are unremarkable. Other: Mastoid air cells are clear. ASPECTS ([place] Stroke Program Early CT Score) - Ganglionic level infarction (caudate, lentiform nuclei, internal capsule, insula, M1-M3 cortex): 7 - Supraganglionic infarction (M4-M6 cortex): 3 Total score (0-10 with 10 being normal): 10 IMPRESSION: There is no acute intracranial hemorrhage or evidence of acute infarction. ASPECT score is 10. These results were communicated to Dr. Theda Sers at 8:40 pm on 07/27/2021 by text page via the [facility] messaging system. Electronically Signed   By: Macy Mis M.D.   On: 07/27/2021 20:41   (Echo, Carotid, EGD, Colonoscopy, ERCP)    Subjective: No complaints  Discharge Exam: Vitals:   07/29/21 0309 07/29/21 0730  BP: 130/75 (!) 139/91  Pulse: 64 71  Resp: 18 17  Temp: 97.6 F (36.4 C) 97.8 F (36.6 C)  SpO2: 97% 96%   Vitals:   07/28/21 2000 07/28/21 2323 07/29/21 0309 07/29/21 0730  BP: (!) 143/79 134/71 130/75 (!) 139/91   Pulse: 73 62 64 71  Resp: 18 18 18 17   Temp: 98.2 F (36.8 C) 97.9 F (36.6 C) 97.6 F (36.4 C) 97.8 F (36.6 C)  TempSrc: Oral Oral Oral Oral  SpO2: 97% 97% 97% 96%  Weight:      Height:        General: Pt is alert, awake, not in acute distress Cardiovascular: RRR, S1/S2 +, no rubs, no gallops Respiratory: CTA bilaterally, no wheezing, no rhonchi Abdominal: Soft, NT, ND, bowel sounds + Extremities: no edema, no cyanosis    The results of significant diagnostics from this hospitalization (including imaging, microbiology, ancillary and laboratory) are listed below for reference.     Microbiology: Recent Results (from the past 240 hour(s))  Resp Panel by RT-PCR (Flu A&B, Covid) Nasopharyngeal Swab     Status: Abnormal   Collection Time: 07/28/21  2:44 PM   Specimen: Nasopharyngeal Swab; Nasopharyngeal(NP) swabs in vial transport medium  Result Value Ref Range Status   SARS Coronavirus 2 by RT PCR POSITIVE (A) NEGATIVE Final    Comment: RESULT CALLED TO, READ BACK BY AND VERIFIED WITH: D,GLEBER RN @1645  07/28/21 EB (NOTE) SARS-CoV-2 target nucleic acids are DETECTED.  The SARS-CoV-2 RNA is generally detectable in upper respiratory specimens during the acute phase of infection. Positive results are indicative of the presence of the identified virus, but do not rule out bacterial infection or co-infection with other pathogens not detected by the test. Clinical correlation with patient history and other diagnostic information is necessary to determine patient infection status. The expected result is Negative.  Fact Sheet for Patients: EntrepreneurPulse.com.au  Fact Sheet for Healthcare Providers: IncredibleEmployment.be  This test is not yet approved or cleared by the [place] FDA and  has been authorized for detection and/or diagnosis of SARS-CoV-2 by FDA under an Emergency Use Authorization (EUA).  This EUA will remain in  effect (meaning this test can be use d) for the duration of  the COVID-19 declaration under Section 564(b)(1) of the Act, 21 U.S.C. section 360bbb-3(b)(1), unless the authorization is terminated or revoked sooner.     Influenza A by PCR NEGATIVE NEGATIVE Final   Influenza B by PCR NEGATIVE NEGATIVE Final    Comment: (NOTE) The Xpert Xpress SARS-CoV-2/FLU/RSV plus assay is intended as an aid in the diagnosis of influenza from Nasopharyngeal swab specimens and should not be used as a sole basis for treatment. Nasal washings and aspirates are unacceptable for Xpert Xpress SARS-CoV-2/FLU/RSV testing.  Fact Sheet for Patients: EntrepreneurPulse.com.au  Fact Sheet for Healthcare Providers: IncredibleEmployment.be  This test is not yet approved or cleared by the [place] FDA and has been authorized for detection and/or diagnosis of SARS-CoV-2 by FDA under an Emergency Use Authorization (EUA). This EUA will remain in effect (meaning this test can be used) for the duration of the COVID-19 declaration under Section 564(b)(1) of the Act, 21 U.S.C. section 360bbb-3(b)(1), unless the authorization is terminated or revoked.  Performed at [facility], [place] [address], [place], [place] [address]      Labs: BNP (last 3 results) No results for input(s): BNP in the last 8760 hours. Basic Metabolic Panel: Recent Labs  Lab 07/27/21 2022 07/27/21 2028 07/28/21 0330  NA 138 140 139  K 3.5 3.5 4.5  CL 105 105 106  CO2 22  --  28  GLUCOSE 113* 110* 107*  BUN 16 17 14   CREATININE 0.97 1.00 1.06  CALCIUM 9.1  --  8.9  MG  --   --  2.3   Liver Function Tests: Recent Labs  Lab 07/27/21 2022 07/28/21 0330  AST 30 30  ALT 39 36  ALKPHOS 74 62  BILITOT 0.6 0.6  PROT 7.2 6.7  ALBUMIN 4.1 3.7   No results for input(s): LIPASE, AMYLASE in the last 168 hours. No results for input(s): AMMONIA in the last 168 hours. CBC: Recent Labs  Lab  07/27/21 2022 07/27/21 2028 07/28/21 0330  WBC 6.1  --  5.4  NEUTROABS 3.5  --  3.1  HGB 15.8 15.6 14.9  HCT 47.4 46.0 43.6  MCV 94.8  --  92.4  PLT 162  --  166   Cardiac Enzymes: No results for input(s): CKTOTAL, CKMB, CKMBINDEX, TROPONINI in the last 168 hours. BNP: Invalid input(s): POCBNP CBG: Recent Labs  Lab 07/27/21 2023  GLUCAP 117*   D-Dimer No results for input(s): DDIMER in the last 72 hours. Hgb A1c Recent Labs    07/28/21 0330  HGBA1C 5.6   Lipid Profile Recent Labs    07/28/21 0330  CHOL 166  HDL 47  LDLCALC 104*  TRIG 74  CHOLHDL 3.5   Thyroid function studies No results for input(s): TSH, T4TOTAL, T3FREE, THYROIDAB in the last 72 hours.  Invalid input(s): FREET3 Anemia work up No results for input(s): VITAMINB12, FOLATE, FERRITIN, TIBC, IRON, RETICCTPCT in the last 72 hours. Urinalysis    Component Value Date/Time   COLORURINE YELLOW 12/04/2020 1008   APPEARANCEUR CLEAR 12/04/2020 1008   LABSPEC 1.017 12/04/2020 1008   PHURINE 5.0 12/04/2020 1008   GLUCOSEU NEGATIVE 12/04/2020 1008   HGBUR NEGATIVE 12/04/2020 1008   BILIRUBINUR NEGATIVE 12/04/2020 1008   BILIRUBINUR neg 03/23/2015 1707   KETONESUR 5 (A) 12/04/2020 1008   PROTEINUR NEGATIVE 12/04/2020 1008   UROBILINOGEN 0.2 03/23/2015 1707   NITRITE NEGATIVE 12/04/2020 1008   LEUKOCYTESUR NEGATIVE 12/04/2020 1008  Sepsis Labs Invalid input(s): PROCALCITONIN,  WBC,  LACTICIDVEN Microbiology Recent Results (from the past 240 hour(s))  Resp Panel by RT-PCR (Flu A&B, Covid) Nasopharyngeal Swab     Status: Abnormal   Collection Time: 07/28/21  2:44 PM   Specimen: Nasopharyngeal Swab; Nasopharyngeal(NP) swabs in vial transport medium  Result Value Ref Range Status   SARS Coronavirus 2 by RT PCR POSITIVE (A) NEGATIVE Final    Comment: RESULT CALLED TO, READ BACK BY AND VERIFIED WITH: D,GLEBER RN @1645  07/28/21 EB (NOTE) SARS-CoV-2 target nucleic acids are DETECTED.  The SARS-CoV-2  RNA is generally detectable in upper respiratory specimens during the acute phase of infection. Positive results are indicative of the presence of the identified virus, but do not rule out bacterial infection or co-infection with other pathogens not detected by the test. Clinical correlation with patient history and other diagnostic information is necessary to determine patient infection status. The expected result is Negative.  Fact Sheet for Patients: EntrepreneurPulse.com.au  Fact Sheet for Healthcare Providers: IncredibleEmployment.be  This test is not yet approved or cleared by the [place] FDA and  has been authorized for detection and/or diagnosis of SARS-CoV-2 by FDA under an Emergency Use Authorization (EUA).  This EUA will remain in effect (meaning this test can be use d) for the duration of  the COVID-19 declaration under Section 564(b)(1) of the Act, 21 U.S.C. section 360bbb-3(b)(1), unless the authorization is terminated or revoked sooner.     Influenza A by PCR NEGATIVE NEGATIVE Final   Influenza B by PCR NEGATIVE NEGATIVE Final    Comment: (NOTE) The Xpert Xpress SARS-CoV-2/FLU/RSV plus assay is intended as an aid in the diagnosis of influenza from Nasopharyngeal swab specimens and should not be used as a sole basis for treatment. Nasal washings and aspirates are unacceptable for Xpert Xpress SARS-CoV-2/FLU/RSV testing.  Fact Sheet for Patients: EntrepreneurPulse.com.au  Fact Sheet for Healthcare Providers: IncredibleEmployment.be  This test is not yet approved or cleared by the [place] FDA and has been authorized for detection and/or diagnosis of SARS-CoV-2 by FDA under an Emergency Use Authorization (EUA). This EUA will remain in effect (meaning this test can be used) for the duration of the COVID-19 declaration under Section 564(b)(1) of the Act, 21 U.S.C. section  360bbb-3(b)(1), unless the authorization is terminated or revoked.  Performed at [facility], [place] [address] [place], [place] [address]    SIGNED:   Charlynne Cousins, [name]  [facility] 07/29/2021, 9:20 AM Pager   If 7PM-7AM, please contact night-coverage [email or web address] Password [email or web address]

## 2021-07-31 ENCOUNTER — Other Ambulatory Visit: Payer: Self-pay

## 2021-07-31 ENCOUNTER — Telehealth: Payer: Self-pay | Admitting: Medical

## 2021-07-31 ENCOUNTER — Encounter: Payer: Self-pay | Admitting: Internal Medicine

## 2021-07-31 ENCOUNTER — Other Ambulatory Visit (INDEPENDENT_AMBULATORY_CARE_PROVIDER_SITE_OTHER): Payer: Commercial Managed Care - PPO

## 2021-07-31 ENCOUNTER — Telehealth: Payer: Self-pay | Admitting: Cardiovascular Disease

## 2021-07-31 DIAGNOSIS — E782 Mixed hyperlipidemia: Secondary | ICD-10-CM | POA: Diagnosis not present

## 2021-07-31 DIAGNOSIS — R7303 Prediabetes: Secondary | ICD-10-CM

## 2021-07-31 DIAGNOSIS — E559 Vitamin D deficiency, unspecified: Secondary | ICD-10-CM

## 2021-07-31 DIAGNOSIS — E538 Deficiency of other specified B group vitamins: Secondary | ICD-10-CM | POA: Diagnosis not present

## 2021-07-31 DIAGNOSIS — Z0001 Encounter for general adult medical examination with abnormal findings: Secondary | ICD-10-CM | POA: Diagnosis not present

## 2021-07-31 LAB — LIPID PANEL
Cholesterol: 159 mg/dL (ref 0–200)
HDL: 43.8 mg/dL (ref >39.00)
LDL Cholesterol: 96 mg/dL (ref 0–99)
NonHDL: 115.45
Total CHOL/HDL Ratio: 4
Triglycerides: 98 mg/dL (ref 0.0–149.0)
VLDL: 19.6 mg/dL (ref 0.0–40.0)

## 2021-07-31 LAB — CBC WITH DIFFERENTIAL/PLATELET
Basophils Absolute: 0 10*3/uL (ref 0.0–0.1)
Basophils Relative: 0.9 % (ref 0.0–3.0)
Eosinophils Absolute: 0.1 10*3/uL (ref 0.0–0.7)
Eosinophils Relative: 2.7 % (ref 0.0–5.0)
HCT: 44.8 % (ref 39.0–52.0)
Hemoglobin: 15.3 g/dL (ref 13.0–17.0)
Lymphocytes Relative: 25.6 % (ref 12.0–46.0)
Lymphs Abs: 1.2 10*3/uL (ref 0.7–4.0)
MCHC: 34 g/dL (ref 30.0–36.0)
MCV: 93 fl (ref 78.0–100.0)
Monocytes Absolute: 0.6 10*3/uL (ref 0.1–1.0)
Monocytes Relative: 12.6 % — ABNORMAL HIGH (ref 3.0–12.0)
Neutro Abs: 2.7 10*3/uL (ref 1.4–7.7)
Neutrophils Relative %: 58.2 % (ref 43.0–77.0)
Platelets: 174 10*3/uL (ref 150.0–400.0)
RBC: 4.82 Mil/uL (ref 4.22–5.81)
RDW: 12.9 % (ref 11.5–15.5)
WBC: 4.7 10*3/uL (ref 4.0–10.5)

## 2021-07-31 LAB — COMPREHENSIVE METABOLIC PANEL
ALT: 35 U/L (ref 0–53)
AST: 28 U/L (ref 0–37)
Albumin: 4.6 g/dL (ref 3.5–5.2)
Alkaline Phosphatase: 69 U/L (ref 39–117)
BUN: 14 mg/dL (ref 6–23)
CO2: 29 mEq/L (ref 19–32)
Calcium: 9.4 mg/dL (ref 8.4–10.5)
Chloride: 100 mEq/L (ref 96–112)
Creatinine, Ser: 1.07 mg/dL (ref 0.40–1.50)
GFR: 69.79 mL/min (ref >60.00)
Glucose, Bld: 103 mg/dL — ABNORMAL HIGH (ref 70–99)
Potassium: 3.8 mEq/L (ref 3.5–5.1)
Sodium: 138 mEq/L (ref 135–145)
Total Bilirubin: 0.8 mg/dL (ref 0.2–1.2)
Total Protein: 7.4 g/dL (ref 6.0–8.3)

## 2021-07-31 LAB — VITAMIN B12: Vitamin B-12: 861 pg/mL (ref 211–911)

## 2021-07-31 LAB — VITAMIN D 25 HYDROXY (VIT D DEFICIENCY, FRACTURES): VITD: 54.98 ng/mL (ref 30.00–100.00)

## 2021-07-31 LAB — TSH: TSH: 4.5 u[IU]/mL (ref 0.35–5.50)

## 2021-07-31 LAB — HEMOGLOBIN A1C: Hgb A1c MFr Bld: 5.8 % (ref 4.6–6.5)

## 2021-07-31 NOTE — Telephone Encounter (Signed)
Patient hasn't received monitor.  He wants to make sure you have one available.  Otherwise should he cancel the appt his has for tomorrow with you.  Would like a call back.

## 2021-07-31 NOTE — H&P (View-Only) (Signed)
Cardiology Office Note   Date:  08/02/2021   ID:  Steve [name], DOB December 21, 1950, MRN [id number]  PCP:  Binnie Rail, MD  Cardiologist:  Sanda Klein, MD EP: None  Chief Complaint  Patient presents with   Hospitalization Follow-up    Recent CVA      History of Present Illness: Steve [name] is a 70 y.o. 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  He was last evaluated by cardiology via a telemedicine visit with Nell Range, PA-C 05/2019.  He was doing quite well from a cardiac standpoint at that time.  He is followed with cardiology since 2016 following a cryptogenic CVA for which an implantable loop recorder did not show any evidence of atrial fibrillation.  He underwent TEE in 2019 which showed a large PFO with left to right and bidirectional flow at rest with associated atrial septal aneurysm.  He underwent PFO closure with Dr. Burt Knack in 2019.  He has not been seen by cardiology in the past 2 years.  Unfortunately he was admitted to the hospital 07/27/2021 - 07/29/2021 for recurrent CVA after presenting with word finding difficulty and left facial droop.  Echocardiogram showed EF 60-65%, G1DD, no RWMA, s/p PFO closure with no obvious residual shunt, and no significant valvular abnormalities.  He was seen by neurology and started on aspirin and Plavix x3 weeks then aspirin alone.  He was recommended to undergo cardiac monitoring outpatient.  He presents today for post hospital follow-up.  Thankfully he has had no neurologic deficits following recent CVA.  He has had no issues with chest pain, shortness of breath, palpitations, dizziness, lightheadedness, syncope, orthopnea, PND, lower extremity edema.  We discussed need for repeat TEE to r/o clot and ensure PFO is stable s/p closure. He  is planning to have a 30-day cardiac monitor placed in our [employer] office later today.  We discussed potential need for repeat implantable loop recorder if 30-day monitor is unrevealing.  Had tentatively planned for TEE 08/09/2021 with Dr. Sallyanne Kuster, however patient has an important business meeting that day that he cannot miss.  We will plan for TEE 08/11/2021 with Dr. Vira Agar.  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   Sleep apnea    Resolved   Squamous cell skin cancer    Dr.King [place]   Stricture esophagus 2013    dilation X 1   Stroke ([place]) 2016   left semiovale stroke    TIA (transient ischemic attack) 01/2015   Ulcerative esophagitis 2013    Past Surgical History:  Procedure Laterality Date   COLONOSCOPY W/ POLYPECTOMY  2013, 2018 last done   tubular adenoma   COLONOSCOPY W/ POLYPECTOMY  2008  hyperplastic   CYSTOSCOPY N/A 09/19/2020   Procedure: CYSTOSCOPY FLEXIBLE;  Surgeon: Franchot Gallo, MD;  Location: [facility];  Service: Urology;  Laterality: N/A;   EP IMPLANTABLE DEVICE N/A 01/03/2016   Procedure: Loop Recorder Insertion;  Surgeon: Sanda Klein, MD;  Location: [facility];  Service: Cardiovascular;  Laterality: N/A;   MOHS SURGERY     x 3   NASAL SINUS SURGERY  15 yrs ago   PATENT FORAMEN OVALE(PFO) CLOSURE N/A 03/27/2018   Procedure: PATENT FORAMEN OVALE (PFO) CLOSURE;  Surgeon: Sherren Mocha, MD;  Location: [facility];  Service: Cardiovascular;   Laterality: N/A;   PROSTATE BIOPSY  2021   RADIOACTIVE SEED IMPLANT N/A 09/19/2020   Procedure: RADIOACTIVE SEED IMPLANT/BRACHYTHERAPY IMPLANT;  Surgeon: Franchot Gallo, MD;  Location: [facility];  Service: Urology;  Laterality: N/A;  90 MINS   REVERSE SHOULDER ARTHROPLASTY Right 03/10/2019   Procedure: REVERSE SHOULDER ARTHROPLASTY;  Surgeon: Marchia Bond, MD;  Location: [facility];  Service: Orthopedics;  Laterality: Right;   ROTATOR CUFF REPAIR Left yrs ago   SPACE OAR INSTILLATION N/A 09/19/2020   Procedure: SPACE OAR INSTILLATION;  Surgeon: Franchot Gallo, MD;  Location: [facility];  Service: Urology;  Laterality: N/A;   TEE WITHOUT CARDIOVERSION N/A 01/06/2018   Procedure: TRANSESOPHAGEAL ECHOCARDIOGRAM (TEE);  Surgeon: Sanda Klein, MD;  Location: [facility];  Service: Cardiovascular;  Laterality: N/A;   torn biceps tendon Left yrs ago   surgical repair   uvulectomy & tonsillectomy  2008   Dr Wilburn Cornelia     Current Outpatient Medications  Medication Sig Dispense Refill   aspirin 81 MG tablet Take 81 mg by mouth daily.     botulinum toxin Type A (BOTOX) 100 units SOLR injection Inject 100 Units into the muscle every 3 (three) months.     cholecalciferol (VITAMIN D3) 25 MCG (1000 UNIT) tablet Take 1,000 Units by mouth daily.     clopidogrel (PLAVIX) 75 MG tablet Take 1 tablet (75 mg total) by mouth daily for 20 days. 20 tablet 0   fluticasone (FLONASE) 50 MCG/ACT nasal spray ONE SPRAY IN EACH NOSTRIL TWICE DAILY ASNEEDED -USE CROSSOVER TECHNIQUE DISCUSSED (Patient taking differently: Place 1 spray into both nostrils 2 (two) times daily as needed for allergies or rhinitis.) 16 g 0   Multiple Vitamins-Minerals (PRESERVISION AREDS 2) CAPS Take 1 capsule by mouth 2 (two) times a day.     pantoprazole (PROTONIX) 40 MG tablet TAKE ONE TABLET DAILY (Patient taking differently: Take 40 mg by mouth daily.) 30 tablet 2   rosuvastatin (CRESTOR) 20 MG tablet  Take 1 tablet (20 mg total) by mouth daily. 90 tablet 3   tamsulosin (FLOMAX) 0.4 MG CAPS capsule Take 0.4 mg by mouth daily.     vardenafil (LEVITRA) 20 MG tablet Take 20 mg by mouth daily as needed for erectile dysfunction.     vitamin B-12 (CYANOCOBALAMIN) 1000 MCG tablet Take 1,000 mcg by mouth daily.     vitamin C (ASCORBIC ACID) 500 MG tablet Take 500 mg by mouth daily.     No current facility-administered medications for this visit.    Allergies:   Pollen extract    Social History:  The patient  reports that he quit smoking about 39 years ago. His smoking use included cigarettes. He has a 5.00 pack-year smoking history. He has never used smokeless tobacco. He reports current alcohol use of about 5.0 - 6.0 standard drinks per week. He reports  that he does not use drugs.   Family History:  The patient's family history includes Breast cancer in his mother; Diabetes in his daughter; Healthy in his brother; Heart attack in his maternal grandfather; Hyperlipidemia in his son; Prostate cancer in his father; Stroke in his father and paternal uncle; Uterine cancer in his mother.    ROS:  Please see the history of present illness.   Otherwise, review of systems are positive for none.   All other systems are reviewed and negative.    PHYSICAL EXAM: VS:  BP 122/78   Pulse 80   Ht 6\' 3"  (1.905 m)   Wt 199 lb 6.4 oz (90.4 kg)   SpO2 96%   BMI 24.92 kg/m  , BMI Body mass index is 24.92 kg/m. GEN: Well nourished, well developed, in no acute distress HEENT: normal Neck: no JVD, carotid bruits, or masses Cardiac: RRR; no murmurs, rubs, or gallops, no edema  Respiratory:  clear to auscultation bilaterally, normal work of breathing GI: soft, nontender, nondistended, + BS MS: no deformity or atrophy Skin: warm and dry, no rash Neuro:  Strength and sensation are intact Psych: euthymic mood, full affect   EKG:  EKG is not ordered today.   Recent Labs: 07/28/2021: Magnesium  2.3 07/31/2021: ALT 35; BUN 14; Creatinine, Ser 1.07; Hemoglobin 15.3; Platelets 174.0; Potassium 3.8; Sodium 138; TSH 4.50    Lipid Panel    Component Value Date/Time   CHOL 159 07/31/2021 0818   CHOL 183 02/07/2015 1535   TRIG 98.0 07/31/2021 0818   TRIG 81 02/07/2015 1535   HDL 43.80 07/31/2021 0818   HDL 63 02/07/2015 1535   CHOLHDL 4 07/31/2021 0818   VLDL 19.6 07/31/2021 0818   LDLCALC 96 07/31/2021 0818   LDLCALC 104 (H) 02/07/2015 1535   LDLDIRECT 172.0 07/24/2007 0000      Wt Readings from Last 3 Encounters:  08/02/21 199 lb 6.4 oz (90.4 kg)  07/28/21 195 lb (88.5 kg)  07/26/21 199 lb (90.3 kg)      Other studies Reviewed: Additional studies/ records that were reviewed today include:   Echocardiogram 07/2021: 1. PFO closure device noted; no obvious residual shunt.   2. Left ventricular ejection fraction, by estimation, is 60 to 65%. The  left ventricle has normal function. The left ventricle has no regional  wall motion abnormalities. Left ventricular diastolic parameters are  consistent with Grade I diastolic  dysfunction (impaired relaxation).   3. Right ventricular systolic function is normal. The right ventricular  size is normal.   4. The mitral valve is normal in structure. Trivial mitral valve  regurgitation. No evidence of mitral stenosis.   5. The aortic valve is normal in structure. Aortic valve regurgitation is  not visualized. No aortic stenosis is present.   6. The inferior vena cava is normal in size with greater than 50%  respiratory variability, suggesting right atrial pressure of 3 mmHg.     ASSESSMENT AND PLAN:   1. 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  Prior history in 2016 with unrevealing implantable loop recorder.  He underwent PFO closure in 2019 after TEE revealed large PFO.  No symptoms to suggest atrial fibrillation.   We will have a cardiac monitor placed later this morning for further evaluation for arrhythmia. - We will plan for TEE to evaluate for cardiac etiology of recent CVA - Shared Decision Making/Informed Consent{ The risks [  esophageal damage, perforation (1:10,000 risk), bleeding, pharyngeal hematoma as well as other potential complications associated with conscious sedation including aspiration, arrhythmia, respiratory failure and death], benefits (treatment guidance and diagnostic support) and alternatives of a transesophageal echocardiogram were discussed in detail with Steve [name] and he is willing to proceed.  - Scheduled for 08/11/21 at 08:30pm with Dr. Audie Box - Continue aspirin and plavix x3 weeks, then aspirin monotherapy - continue statin  2. HLD: LDL 96 07/31/21.  PCP plan to transition to Crestor 20 mg daily - Agree with transition to high intensity statin - continue Crestor 20 mg daily - Will need repeat FLP/LFTs in 6 to 8 weeks    Current medicines are reviewed at length with the patient today.  The patient does not have concerns regarding medicines.  The following changes have been made:  As above  Labs/ tests ordered today include:   Orders Placed This Encounter  Procedures   CBC   Basic Metabolic Panel (BMET)   Lipid panel      Disposition:   FU with Dr. Sallyanne Kuster in 2 months  Signed, Abigail Butts, PA-C  08/02/2021 8:42 AM

## 2021-07-31 NOTE — Telephone Encounter (Signed)
Returned call to patient who was calling to see if he could possibly have his heart monitor placed at his appointment on Wednesday 10/26 with Roby Lofts PA-C. Advised patient that we do not place the monitors here at the [facility] but that I would send a message to our [address] office and monitor department to see if he could have the monitor placed at their office. Advised patient to also keep his appointment for 10/26 with Roby Lofts PA-C. Patient verbalized understanding.

## 2021-07-31 NOTE — Telephone Encounter (Signed)
Patient was in the hospital this weekend due to having a stroke. I was able to get him scheduled to see Steve [name] on 10/26 at 8 am. He still would like for the nurse to give him a call to explain what happened

## 2021-07-31 NOTE — Telephone Encounter (Signed)
Patient scheduled to have monitor serial # [id number] from office inventory applied at the [facility] office on Wednesday, 08/02/21, at 11:30 AM..  We will notify [employer] to attach this serial # to his 07/31/21 enrollment.

## 2021-07-31 NOTE — Progress Notes (Signed)
Cardiology Office Note   Date:  08/02/2021   ID:  GASTON DASE, DOB 09-Feb-1951, MRN [id number]  PCP:  Binnie Rail, MD  Cardiologist:  Sanda Klein, MD EP: None  Chief Complaint  Patient presents with   Hospitalization Follow-up    Recent CVA      History of Present Illness: Steve [name] is a 70 y.o. 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  He was last evaluated by cardiology via a telemedicine visit with Nell Range, PA-C 05/2019.  He was doing quite well from a cardiac standpoint at that time.  He is followed with cardiology since 2016 following a cryptogenic CVA for which an implantable loop recorder did not show any evidence of atrial fibrillation.  He underwent TEE in 2019 which showed a large PFO with left to right and bidirectional flow at rest with associated atrial septal aneurysm.  He underwent PFO closure with Dr. Burt Knack in 2019.  He has not been seen by cardiology in the past 2 years.  Unfortunately he was admitted to the hospital 07/27/2021 - 07/29/2021 for recurrent CVA after presenting with word finding difficulty and left facial droop.  Echocardiogram showed EF 60-65%, G1DD, no RWMA, s/p PFO closure with no obvious residual shunt, and no significant valvular abnormalities.  He was seen by neurology and started on aspirin and Plavix x3 weeks then aspirin alone.  He was recommended to undergo cardiac monitoring outpatient.  He presents today for post hospital follow-up.  Thankfully he has had no neurologic deficits following recent CVA.  He has had no issues with chest pain, shortness of breath, palpitations, dizziness, lightheadedness, syncope, orthopnea, PND, lower extremity edema.  We discussed need for repeat TEE to r/o clot and ensure PFO is stable s/p closure. He  is planning to have a 30-day cardiac monitor placed in our [employer] office later today.  We discussed potential need for repeat implantable loop recorder if 30-day monitor is unrevealing.  Had tentatively planned for TEE 08/09/2021 with Dr. Sallyanne Kuster, however patient has an important business meeting that day that he cannot miss.  We will plan for TEE 08/11/2021 with Dr. Vira Agar.  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   Sleep apnea    Resolved   Squamous cell skin cancer    Dr.King [place]   Stricture esophagus 2013    dilation X 1   Stroke ([place]) 2016   left semiovale stroke    TIA (transient ischemic attack) 01/2015   Ulcerative esophagitis 2013    Past Surgical History:  Procedure Laterality Date   COLONOSCOPY W/ POLYPECTOMY  2013, 2018 last done   tubular adenoma   COLONOSCOPY W/ POLYPECTOMY  2008  hyperplastic   CYSTOSCOPY N/A 09/19/2020   Procedure: CYSTOSCOPY FLEXIBLE;  Surgeon: Franchot Gallo, MD;  Location: [facility];  Service: Urology;  Laterality: N/A;   EP IMPLANTABLE DEVICE N/A 01/03/2016   Procedure: Loop Recorder Insertion;  Surgeon: Sanda Klein, MD;  Location: [place] [facility];  Service: Cardiovascular;  Laterality: N/A;   MOHS SURGERY     x 3   NASAL SINUS SURGERY  15 yrs ago   PATENT FORAMEN OVALE(PFO) CLOSURE N/A 03/27/2018   Procedure: PATENT FORAMEN OVALE (PFO) CLOSURE;  Surgeon: Sherren Mocha, MD;  Location: [place] [facility];  Service: Cardiovascular;   Laterality: N/A;   PROSTATE BIOPSY  2021   RADIOACTIVE SEED IMPLANT N/A 09/19/2020   Procedure: RADIOACTIVE SEED IMPLANT/BRACHYTHERAPY IMPLANT;  Surgeon: Franchot Gallo, MD;  Location: [facility];  Service: Urology;  Laterality: N/A;  90 MINS   REVERSE SHOULDER ARTHROPLASTY Right 03/10/2019   Procedure: REVERSE SHOULDER ARTHROPLASTY;  Surgeon: Marchia Bond, MD;  Location: [facility];  Service: Orthopedics;  Laterality: Right;   ROTATOR CUFF REPAIR Left yrs ago   SPACE OAR INSTILLATION N/A 09/19/2020   Procedure: SPACE OAR INSTILLATION;  Surgeon: Franchot Gallo, MD;  Location: [facility];  Service: Urology;  Laterality: N/A;   TEE WITHOUT CARDIOVERSION N/A 01/06/2018   Procedure: TRANSESOPHAGEAL ECHOCARDIOGRAM (TEE);  Surgeon: Sanda Klein, MD;  Location: [facility];  Service: Cardiovascular;  Laterality: N/A;   torn biceps tendon Left yrs ago   surgical repair   uvulectomy & tonsillectomy  2008   Dr Wilburn Cornelia     Current Outpatient Medications  Medication Sig Dispense Refill   aspirin 81 MG tablet Take 81 mg by mouth daily.     botulinum toxin Type A (BOTOX) 100 units SOLR injection Inject 100 Units into the muscle every 3 (three) months.     cholecalciferol (VITAMIN D3) 25 MCG (1000 UNIT) tablet Take 1,000 Units by mouth daily.     clopidogrel (PLAVIX) 75 MG tablet Take 1 tablet (75 mg total) by mouth daily for 20 days. 20 tablet 0   fluticasone (FLONASE) 50 MCG/ACT nasal spray ONE SPRAY IN EACH NOSTRIL TWICE DAILY ASNEEDED -USE CROSSOVER TECHNIQUE DISCUSSED (Patient taking differently: Place 1 spray into both nostrils 2 (two) times daily as needed for allergies or rhinitis.) 16 g 0   Multiple Vitamins-Minerals (PRESERVISION AREDS 2) CAPS Take 1 capsule by mouth 2 (two) times a day.     pantoprazole (PROTONIX) 40 MG tablet TAKE ONE TABLET DAILY (Patient taking differently: Take 40 mg by mouth daily.) 30 tablet 2   rosuvastatin (CRESTOR) 20 MG tablet  Take 1 tablet (20 mg total) by mouth daily. 90 tablet 3   tamsulosin (FLOMAX) 0.4 MG CAPS capsule Take 0.4 mg by mouth daily.     vardenafil (LEVITRA) 20 MG tablet Take 20 mg by mouth daily as needed for erectile dysfunction.     vitamin B-12 (CYANOCOBALAMIN) 1000 MCG tablet Take 1,000 mcg by mouth daily.     vitamin C (ASCORBIC ACID) 500 MG tablet Take 500 mg by mouth daily.     No current facility-administered medications for this visit.    Allergies:   Pollen extract    Social History:  The patient  reports that he quit smoking about 39 years ago. His smoking use included cigarettes. He has a 5.00 pack-year smoking history. He has never used smokeless tobacco. He reports current alcohol use of about 5.0 - 6.0 standard drinks per week. He reports  that he does not use drugs.   Family History:  The patient's family history includes Breast cancer in his mother; Diabetes in his daughter; Healthy in his brother; Heart attack in his maternal grandfather; Hyperlipidemia in his son; Prostate cancer in his father; Stroke in his father and paternal uncle; Uterine cancer in his mother.    ROS:  Please see the history of present illness.   Otherwise, review of systems are positive for none.   All other systems are reviewed and negative.    PHYSICAL EXAM: VS:  BP 122/78   Pulse 80   Ht 6\' 3"  (1.905 m)   Wt 199 lb 6.4 oz (90.4 kg)   SpO2 96%   BMI 24.92 kg/m  , BMI Body mass index is 24.92 kg/m. GEN: Well nourished, well developed, in no acute distress HEENT: normal Neck: no JVD, carotid bruits, or masses Cardiac: RRR; no murmurs, rubs, or gallops, no edema  Respiratory:  clear to auscultation bilaterally, normal work of breathing GI: soft, nontender, nondistended, + BS MS: no deformity or atrophy Skin: warm and dry, no rash Neuro:  Strength and sensation are intact Psych: euthymic mood, full affect   EKG:  EKG is not ordered today.   Recent Labs: 07/28/2021: Magnesium  2.3 07/31/2021: ALT 35; BUN 14; Creatinine, Ser 1.07; Hemoglobin 15.3; Platelets 174.0; Potassium 3.8; Sodium 138; TSH 4.50    Lipid Panel    Component Value Date/Time   CHOL 159 07/31/2021 0818   CHOL 183 02/07/2015 1535   TRIG 98.0 07/31/2021 0818   TRIG 81 02/07/2015 1535   HDL 43.80 07/31/2021 0818   HDL 63 02/07/2015 1535   CHOLHDL 4 07/31/2021 0818   VLDL 19.6 07/31/2021 0818   LDLCALC 96 07/31/2021 0818   LDLCALC 104 (H) 02/07/2015 1535   LDLDIRECT 172.0 07/24/2007 0000      Wt Readings from Last 3 Encounters:  08/02/21 199 lb 6.4 oz (90.4 kg)  07/28/21 195 lb (88.5 kg)  07/26/21 199 lb (90.3 kg)      Other studies Reviewed: Additional studies/ records that were reviewed today include:   Echocardiogram 07/2021: 1. PFO closure device noted; no obvious residual shunt.   2. Left ventricular ejection fraction, by estimation, is 60 to 65%. The  left ventricle has normal function. The left ventricle has no regional  wall motion abnormalities. Left ventricular diastolic parameters are  consistent with Grade I diastolic  dysfunction (impaired relaxation).   3. Right ventricular systolic function is normal. The right ventricular  size is normal.   4. The mitral valve is normal in structure. Trivial mitral valve  regurgitation. No evidence of mitral stenosis.   5. The aortic valve is normal in structure. Aortic valve regurgitation is  not visualized. No aortic stenosis is present.   6. The inferior vena cava is normal in size with greater than 50%  respiratory variability, suggesting right atrial pressure of 3 mmHg.     ASSESSMENT AND PLAN:   1. 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  Prior history in 2016 with unrevealing implantable loop recorder.  He underwent PFO closure in 2019 after TEE revealed large PFO.  No symptoms to suggest atrial fibrillation.   We will have a cardiac monitor placed later this morning for further evaluation for arrhythmia. - We will plan for TEE to evaluate for cardiac etiology of recent CVA - Shared Decision Making/Informed Consent{ The risks [  esophageal damage, perforation (1:10,000 risk), bleeding, pharyngeal hematoma as well as other potential complications associated with conscious sedation including aspiration, arrhythmia, respiratory failure and death], benefits (treatment guidance and diagnostic support) and alternatives of a transesophageal echocardiogram were discussed in detail with Mr. Baca and he is willing to proceed.  - Scheduled for 08/11/21 at 08:30pm with Dr. Audie Box - Continue aspirin and plavix x3 weeks, then aspirin monotherapy - continue statin  2. HLD: LDL 96 07/31/21.  PCP plan to transition to Crestor 20 mg daily - Agree with transition to high intensity statin - continue Crestor 20 mg daily - Will need repeat FLP/LFTs in 6 to 8 weeks    Current medicines are reviewed at length with the patient today.  The patient does not have concerns regarding medicines.  The following changes have been made:  As above  Labs/ tests ordered today include:   Orders Placed This Encounter  Procedures   CBC   Basic Metabolic Panel (BMET)   Lipid panel      Disposition:   FU with Dr. Sallyanne Kuster in 2 months  Signed, Abigail Butts, PA-C  08/02/2021 8:42 AM

## 2021-07-31 NOTE — Telephone Encounter (Signed)
Called patient, he would like to just make Dr.C aware he was in the hospital. They had mentioned doing a heart monitor- but he would like to discuss with someone before doing this.   Patient has appointment on Wednesday- 10/26. He states he will wait to discuss with her.   Will route to MD to make aware.   Thanks!

## 2021-07-31 NOTE — Telephone Encounter (Signed)
See previous triage message.  Patient given MD response below.  Thanks!

## 2021-08-01 ENCOUNTER — Encounter: Payer: Self-pay | Admitting: *Deleted

## 2021-08-01 NOTE — Progress Notes (Signed)
Patient ID: Steve [name], male   DOB: 03/15/1951, 70 y.o.   MRN: [id number] Patient was enrolled 07/31/21 for 30 day cardiac event monitor to be shipped to his home.  Patient told [employer] to hold off on shipping because he wanted monitor applied in office Wednesday, 08/02/21. Evelena Asa from [employer] called 08/01/21 to discuss.  He will change 07/31/21 enrollment from home delivery to office hookup and assign office inventory serial # [id number] to that enrollment. Patient scheduled to have [employer] 30 day cardiac event monitor serial # [id number] applied on 08/02/21 at 11:30AM at [facility] office.

## 2021-08-02 ENCOUNTER — Encounter: Payer: Self-pay | Admitting: Medical

## 2021-08-02 ENCOUNTER — Other Ambulatory Visit: Payer: Self-pay | Admitting: Cardiology

## 2021-08-02 ENCOUNTER — Ambulatory Visit (INDEPENDENT_AMBULATORY_CARE_PROVIDER_SITE_OTHER): Payer: 59

## 2021-08-02 ENCOUNTER — Other Ambulatory Visit: Payer: Self-pay

## 2021-08-02 ENCOUNTER — Encounter (HOSPITAL_COMMUNITY): Payer: Self-pay | Admitting: Cardiovascular Disease

## 2021-08-02 ENCOUNTER — Ambulatory Visit (INDEPENDENT_AMBULATORY_CARE_PROVIDER_SITE_OTHER): Payer: 59 | Admitting: Medical

## 2021-08-02 VITALS — BP 122/78 | HR 80 | Ht 75.0 in | Wt 199.4 lb

## 2021-08-02 DIAGNOSIS — I440 Atrioventricular block, first degree: Secondary | ICD-10-CM | POA: Diagnosis not present

## 2021-08-02 DIAGNOSIS — Q2112 Patent foramen ovale: Secondary | ICD-10-CM

## 2021-08-02 DIAGNOSIS — G459 Transient cerebral ischemic attack, unspecified: Secondary | ICD-10-CM | POA: Diagnosis not present

## 2021-08-02 DIAGNOSIS — I4891 Unspecified atrial fibrillation: Secondary | ICD-10-CM

## 2021-08-02 DIAGNOSIS — E782 Mixed hyperlipidemia: Secondary | ICD-10-CM

## 2021-08-02 DIAGNOSIS — Z79899 Other long term (current) drug therapy: Secondary | ICD-10-CM

## 2021-08-02 DIAGNOSIS — I63512 Cerebral infarction due to unspecified occlusion or stenosis of left middle cerebral artery: Secondary | ICD-10-CM | POA: Diagnosis not present

## 2021-08-02 DIAGNOSIS — I253 Aneurysm of heart: Secondary | ICD-10-CM

## 2021-08-02 LAB — CBC
Hematocrit: 47.4 % (ref 37.5–51.0)
Hemoglobin: 16.2 g/dL (ref 13.0–17.7)
MCH: 31.5 pg (ref 26.6–33.0)
MCHC: 34.2 g/dL (ref 31.5–35.7)
MCV: 92 fL (ref 79–97)
Platelets: 190 10*3/uL (ref 150–450)
RBC: 5.15 x10E6/uL (ref 4.14–5.80)
RDW: 12.1 % (ref 11.6–15.4)
WBC: 5.2 10*3/uL (ref 3.4–10.8)

## 2021-08-02 LAB — BASIC METABOLIC PANEL
BUN/Creatinine Ratio: 14 (ref 10–24)
BUN: 14 mg/dL (ref 8–27)
CO2: 25 mmol/L (ref 20–29)
Calcium: 9.3 mg/dL (ref 8.6–10.2)
Chloride: 102 mmol/L (ref 96–106)
Creatinine, Ser: 0.98 mg/dL (ref 0.76–1.27)
Glucose: 104 mg/dL — ABNORMAL HIGH (ref 70–99)
Potassium: 4.7 mmol/L (ref 3.5–5.2)
Sodium: 140 mmol/L (ref 134–144)
eGFR: 82 mL/min/{1.73_m2} (ref >59)

## 2021-08-02 MED ORDER — ROSUVASTATIN CALCIUM 20 MG PO TABS: 20.0000 mg | ORAL_TABLET | Freq: Every day | ORAL | 3 refills | Status: DC

## 2021-08-02 NOTE — Patient Instructions (Addendum)
Medication Instructions:  Continue current medications  *If you need a refill on your cardiac medications before your next appointment, please call your pharmacy*   Lab Work: CBC and BMP  If you have labs (blood work) drawn today and your tests are completely normal, you will receive your results only by: [place] (if you have MyChart) OR A paper copy in the mail If you have any lab test that is abnormal or we need to change your treatment, we will call you to review the results.   Testing/Procedures: TEE on Thursday November 4th   Follow-Up: At [facility], you and your health needs are our priority.  As part of our continuing mission to provide you with exceptional heart care, we have created designated Provider Care Teams.  These Care Teams include your primary Cardiologist (physician) and Advanced Practice Providers (APPs -  Physician Assistants and Nurse Practitioners) who all work together to provide you with the care you need, when you need it.  We recommend signing up for the patient portal called "MyChart".  Sign up information is provided on this After Visit Summary.  MyChart is used to connect with patients for Virtual Visits (Telemedicine).  Patients are able to view lab/test results, encounter notes, upcoming appointments, etc.  Non-urgent messages can be sent to your provider as well.   To learn more about what you can do with MyChart, go to [email or web address].    Your next appointment:   Thursday December 22nd @ 8:30 am  The format for your next appointment:   In Person  Provider:   You may see Sanda Klein, MD or one of the following Advanced Practice Providers on your designated Care Team:   Almyra Deforest, PA-C Fabian Sharp, [place] or  Roby Lofts, [place]   Other Instructions Dear Steve [name]   You are scheduled for a  TEE on November 4th with Dr. Audie Box.  Please arrive at the [facility] (Main Entrance A) at [facility] [place], [place] [address] at 8:30 am. (1 hour prior to procedure unless lab work is needed; if lab work is needed arrive 1.5 hours ahead)  DIET: Nothing to eat or drink after midnight except a sip of water with medications (see medication instructions below)  FYI: For your safety, and to allow [place] to monitor your vital signs accurately during the surgery/procedure we request that   if you have artificial nails, gel coating, SNS etc. Please have those removed prior to your surgery/procedure. Not having the nail coverings /polish removed may result in cancellation or delay of your surgery/procedure.   Medication Instructions:             Continue your anticoagulant:  You will need to continue your anticoagulant after your procedure until you  are told by your  Provider that it is safe to stop   Labs: CBC and BMP  Come to: [address]   You must have a responsible person to drive you home and stay in the waiting area during your procedure. Failure to do so could result in cancellation.  Bring your insurance cards.  *Special Note: Every effort is made to have your procedure done on time. Occasionally there are emergencies that occur at the hospital that may cause delays. Please be patient if a delay does occur.

## 2021-08-02 NOTE — Progress Notes (Signed)
Attempted to obtain medical history via telephone, unable to reach at this time. I left a voicemail to return pre surgical testing department's phone call.  

## 2021-08-11 ENCOUNTER — Ambulatory Visit (HOSPITAL_COMMUNITY): Payer: Commercial Managed Care - PPO | Admitting: Anesthesiology

## 2021-08-11 ENCOUNTER — Ambulatory Visit (HOSPITAL_COMMUNITY)
Admission: RE | Admit: 2021-08-11 | Discharge: 2021-08-11 | Disposition: A | Discharge status: Home / Self Care | Payer: Commercial Managed Care - PPO | Attending: Cardiovascular Disease | Admitting: Cardiovascular Disease

## 2021-08-11 ENCOUNTER — Encounter (HOSPITAL_COMMUNITY)
Admission: RE | Disposition: A | Discharge status: Home / Self Care | Payer: Self-pay | Source: Home / Self Care | Attending: Cardiovascular Disease

## 2021-08-11 ENCOUNTER — Ambulatory Visit (HOSPITAL_BASED_OUTPATIENT_CLINIC_OR_DEPARTMENT_OTHER): Payer: Commercial Managed Care - PPO

## 2021-08-11 ENCOUNTER — Other Ambulatory Visit: Payer: Self-pay

## 2021-08-11 ENCOUNTER — Encounter (HOSPITAL_COMMUNITY): Payer: Self-pay | Admitting: Cardiovascular Disease

## 2021-08-11 DIAGNOSIS — E785 Hyperlipidemia, unspecified: Secondary | ICD-10-CM | POA: Insufficient documentation

## 2021-08-11 DIAGNOSIS — Z79899 Other long term (current) drug therapy: Secondary | ICD-10-CM | POA: Diagnosis not present

## 2021-08-11 DIAGNOSIS — Z7902 Long term (current) use of antithrombotics/antiplatelets: Secondary | ICD-10-CM | POA: Diagnosis not present

## 2021-08-11 DIAGNOSIS — I639 Cerebral infarction, unspecified: Secondary | ICD-10-CM

## 2021-08-11 DIAGNOSIS — Z7982 Long term (current) use of aspirin: Secondary | ICD-10-CM | POA: Diagnosis not present

## 2021-08-11 DIAGNOSIS — Z87891 Personal history of nicotine dependence: Secondary | ICD-10-CM | POA: Insufficient documentation

## 2021-08-11 DIAGNOSIS — I6930 Unspecified sequelae of cerebral infarction: Secondary | ICD-10-CM

## 2021-08-11 HISTORY — PX: BUBBLE STUDY: SHX6837

## 2021-08-11 HISTORY — PX: TEE WITHOUT CARDIOVERSION: SHX5443

## 2021-08-11 SURGERY — ECHOCARDIOGRAM, TRANSESOPHAGEAL
Anesthesia: Monitor Anesthesia Care

## 2021-08-11 MED ORDER — PROPOFOL 500 MG/50ML IV EMUL
INTRAVENOUS | Status: DC | PRN
  Administered 2021-08-11: 125 ug/kg/min via INTRAVENOUS

## 2021-08-11 MED ORDER — SODIUM CHLORIDE 0.9 % IV SOLN
INTRAVENOUS | Status: DC
  Administered 2021-08-11: 500 mL via INTRAVENOUS

## 2021-08-11 MED ORDER — PROPOFOL 10 MG/ML IV BOLUS
INTRAVENOUS | Status: DC | PRN
Start: 2021-08-11 — End: 2021-08-11
  Administered 2021-08-11: 10 mg via INTRAVENOUS
  Administered 2021-08-11: 20 mg via INTRAVENOUS

## 2021-08-11 MED ORDER — PHENYLEPHRINE 40 MCG/ML (10ML) SYRINGE FOR IV PUSH (FOR BLOOD PRESSURE SUPPORT)
PREFILLED_SYRINGE | INTRAVENOUS | Status: DC | PRN
  Administered 2021-08-11: 80 ug via INTRAVENOUS

## 2021-08-11 NOTE — CV Procedure (Signed)
Brief TEE Note  LVEF 60-65% No LA/LAA thrombus or masses PFO closure device well-seated.  There is no atrial shunting by color flow Doppler or saline microcavitation study.  For additional details see full report.   Sonal Dorwart C. Oval Linsey, MD, [facility] 08/11/2021 9:16 AM

## 2021-08-11 NOTE — Anesthesia Procedure Notes (Signed)
Procedure Name: MAC Date/Time: 08/11/2021 8:57 AM Performed by: Erick Colace, CRNA Pre-anesthesia Checklist: Patient identified, Emergency Drugs available, Suction available and Patient being monitored Patient Re-evaluated:Patient Re-evaluated prior to induction Oxygen Delivery Method: Nasal cannula Preoxygenation: Pre-oxygenation with 100% oxygen Induction Type: IV induction

## 2021-08-11 NOTE — Transfer of Care (Signed)
Immediate Anesthesia Transfer of Care Note  Patient: Steve [name]  Procedure(s) Performed: TRANSESOPHAGEAL ECHOCARDIOGRAM (TEE) BUBBLE STUDY  Patient Location: PACU  Anesthesia Type:MAC  Level of Consciousness: awake  Airway & Oxygen Therapy: Patient Spontanous Breathing and Patient connected to nasal cannula oxygen  Post-op Assessment: Report given to RN and Post -op Vital signs reviewed and stable  Post vital signs: Reviewed and stable  Last Vitals:  Vitals Value Taken Time  BP 126/78 08/11/21 0919  Temp    Pulse 63 08/11/21 0921  Resp 12 08/11/21 0921  SpO2 96 % 08/11/21 0921  Vitals shown include unvalidated device data.  Last Pain:  Vitals:   08/11/21 0755  TempSrc: Temporal  PainSc: 0-No pain         Complications: No notable events documented.

## 2021-08-11 NOTE — Anesthesia Preprocedure Evaluation (Addendum)
Anesthesia Evaluation  Patient identified by MRN, date of birth, ID band Patient awake    Reviewed: Allergy & Precautions, NPO status , Patient's Chart, lab work & pertinent test results  Airway Mallampati: III  TM Distance: >3 FB Neck ROM: Full    Dental no notable dental hx. (+) Teeth Intact, Dental Advisory Given   Pulmonary sleep apnea , former smoker,    Pulmonary exam normal breath sounds clear to auscultation       Cardiovascular negative cardio ROS Normal cardiovascular exam Rhythm:Regular Rate:Normal  TTE 2022 1. PFO closure device noted; no obvious residual shunt.  2. Left ventricular ejection fraction, by estimation, is 60 to 65%. The  left ventricle has normal function. The left ventricle has no regional  wall motion abnormalities. Left ventricular diastolic parameters are  consistent with Grade I diastolic  dysfunction (impaired relaxation).  3. Right ventricular systolic function is normal. The right ventricular  size is normal.  4. The mitral valve is normal in structure. Trivial mitral valve  regurgitation. No evidence of mitral stenosis.  5. The aortic valve is normal in structure. Aortic valve regurgitation is  not visualized. No aortic stenosis is present.  6. The inferior vena cava is normal in size with greater than 50%  respiratory variability, suggesting right atrial pressure of 3 mmHg.    Neuro/Psych Right facial droop at baseline TIACVA, No Residual Symptoms negative psych ROS   GI/Hepatic Neg liver ROS, PUD, GERD  Medicated and Controlled,  Endo/Other  negative endocrine ROS  Renal/GU negative Renal ROS  negative genitourinary   Musculoskeletal  (+) Arthritis ,   Abdominal   Peds  Hematology negative hematology ROS (+)   Anesthesia Other Findings   Reproductive/Obstetrics                            Anesthesia Physical Anesthesia Plan  ASA:  3  Anesthesia Plan: MAC   Post-op Pain Management:    Induction: Intravenous  PONV Risk Score and Plan: Propofol infusion and Treatment may vary due to age or medical condition  Airway Management Planned: Natural Airway  Additional Equipment:   Intra-op Plan:   Post-operative Plan:   Informed Consent: I have reviewed the patients History and Physical, chart, labs and discussed the procedure including the risks, benefits and alternatives for the proposed anesthesia with the patient or authorized representative who has indicated his/her understanding and acceptance.     Dental advisory given  Plan Discussed with: CRNA  Anesthesia Plan Comments:         Anesthesia Quick Evaluation

## 2021-08-11 NOTE — Interval H&P Note (Signed)
History and Physical Interval Note:  08/11/2021 8:42 AM  Steve [name]  has presented today for surgery, with the diagnosis of STROKE.  The various methods of treatment have been discussed with the patient and family. After consideration of risks, benefits and other options for treatment, the patient has consented to  Procedure(s): TRANSESOPHAGEAL ECHOCARDIOGRAM (TEE) (N/A) as a surgical intervention.  The patient's history has been reviewed, patient examined, no change in status, stable for surgery.  I have reviewed the patient's chart and labs.  Questions were answered to the patient's satisfaction.     Skeet Latch, MD

## 2021-08-11 NOTE — Anesthesia Postprocedure Evaluation (Signed)
Anesthesia Post Note  Patient: Steve [name]  Procedure(s) Performed: TRANSESOPHAGEAL ECHOCARDIOGRAM (TEE) BUBBLE STUDY     Patient location during evaluation: PACU Anesthesia Type: MAC Level of consciousness: awake and alert Pain management: pain level controlled Vital Signs Assessment: post-procedure vital signs reviewed and stable Respiratory status: spontaneous breathing, nonlabored ventilation, respiratory function stable and patient connected to nasal cannula oxygen Cardiovascular status: stable and blood pressure returned to baseline Postop Assessment: no apparent nausea or vomiting Anesthetic complications: no   No notable events documented.  Last Vitals:  Vitals:   08/11/21 0920 08/11/21 0935  BP: 126/78 (!) 137/93  Pulse: 69 62  Resp: 14 15  Temp: 36.4 C   SpO2: 95% 98%    Last Pain:  Vitals:   08/11/21 0935  TempSrc:   PainSc: 0-No pain                 Reeta Kuk L K[facility] [place]

## 2021-08-13 ENCOUNTER — Encounter (HOSPITAL_COMMUNITY): Payer: Self-pay | Admitting: Cardiovascular Disease

## 2021-08-22 ENCOUNTER — Telehealth: Payer: Self-pay | Admitting: Cardiovascular Disease

## 2021-08-22 NOTE — Telephone Encounter (Signed)
   Aaron Edelman with [employer] calling to give critical result

## 2021-08-22 NOTE — Telephone Encounter (Signed)
Spoke with Aaron Edelman he states at 246 pm [place] time. that pt had a 9 beat run of SVT at 192 then immediately back to 70 BPM sinus. Aaron Edelman states that he tried to call pt but was not able to speak with pt so he left a voice mail.   Monitor was ordered for TIA to rule out AFIB. Related dx: TIA (transient ischemic attack) [G45.9] First degree atrioventricular block [I44.0] Atrial fibrillation, unspecified type ([place]) [I48.91]   Pt states that he did not have any sx and he states that he was driving and did not feel anything. And continued to drive.  DOD unavailable, discussed with Dr Percival Spanish. No changes, no new orders.

## 2021-08-23 ENCOUNTER — Ambulatory Visit: Payer: 59 | Admitting: Neurology

## 2021-08-26 ENCOUNTER — Encounter: Payer: Self-pay | Admitting: Internal Medicine

## 2021-09-05 ENCOUNTER — Other Ambulatory Visit: Payer: Self-pay

## 2021-09-05 ENCOUNTER — Other Ambulatory Visit (INDEPENDENT_AMBULATORY_CARE_PROVIDER_SITE_OTHER): Payer: 59

## 2021-09-05 ENCOUNTER — Encounter: Payer: Self-pay | Admitting: Internal Medicine

## 2021-09-05 DIAGNOSIS — E782 Mixed hyperlipidemia: Secondary | ICD-10-CM | POA: Diagnosis not present

## 2021-09-05 LAB — HEPATIC FUNCTION PANEL
ALT: 27 U/L (ref 0–53)
AST: 24 U/L (ref 0–37)
Albumin: 4.5 g/dL (ref 3.5–5.2)
Alkaline Phosphatase: 65 U/L (ref 39–117)
Bilirubin, Direct: 0.1 mg/dL (ref 0.0–0.3)
Total Bilirubin: 0.5 mg/dL (ref 0.2–1.2)
Total Protein: 7.3 g/dL (ref 6.0–8.3)

## 2021-09-05 LAB — LIPID PANEL
Cholesterol: 139 mg/dL (ref 0–200)
HDL: 49.3 mg/dL (ref >39.00)
LDL Cholesterol: 61 mg/dL (ref 0–99)
NonHDL: 90.05
Total CHOL/HDL Ratio: 3
Triglycerides: 143 mg/dL (ref 0.0–149.0)
VLDL: 28.6 mg/dL (ref 0.0–40.0)

## 2021-09-05 NOTE — Telephone Encounter (Signed)
Sent message to patient

## 2021-09-13 ENCOUNTER — Other Ambulatory Visit: Payer: Self-pay

## 2021-09-13 ENCOUNTER — Encounter: Payer: Self-pay | Admitting: Neurology

## 2021-09-13 ENCOUNTER — Ambulatory Visit: Payer: Commercial Managed Care - PPO | Admitting: Neurology

## 2021-09-13 VITALS — BP 130/76 | HR 92 | Ht 75.0 in | Wt 201.0 lb

## 2021-09-13 DIAGNOSIS — I639 Cerebral infarction, unspecified: Secondary | ICD-10-CM

## 2021-09-13 DIAGNOSIS — I779 Disorder of arteries and arterioles, unspecified: Secondary | ICD-10-CM | POA: Diagnosis not present

## 2021-09-13 DIAGNOSIS — Z8673 Personal history of transient ischemic attack (TIA), and cerebral infarction without residual deficits: Secondary | ICD-10-CM

## 2021-09-13 NOTE — Patient Instructions (Signed)
Return to clinic in 3 months

## 2021-09-13 NOTE — Progress Notes (Signed)
d   Follow-up Visit   Date: 2021-09-14    Steve [name] MRN: [id number] DOB: [birth year]   Interim History: Steve [name] is a 70 y.o. right-handed Caucasian male with right hemifacial spasm, hyperlipidemia, BPH, GERD, and left semiovale stroke (2016, manifesting with right arm weakness and facial numbness), prostate cancer (2021) returning to the clinic for with new left embolic stroke ([id number]/[id number]).  History of present illness: On April 23rd 2015, he woke up with right arm heaviness/weakness and right sided paresthesias. There was no weakness of the right lower extremity or the face.  Symptoms resolved within about 6 hours.  He has noticed that his signature and hand writing is showing subtle differences.  He stopped taking aspirin several years ago and restarted aspirin 81mg  when these symptoms started.  MRI brain showed small left centrum semiovale stroke.  There is no evidence of large vessel occlusion. Echocardiogram is normal except moderate right-to-left shunt without atrial septal aneurysm. He had PFO closure on 03/27/18.  He has seen Dr. Erling Cruz in the late 1990s for hemifacial spasm and gets botox injected.  In 2019, he began having left sided tightness of the thigh . Sometimes, he has difficulty raising the left leg, such as with wearing pants.  There is some associated low back pain.  He has seen [facility] in the past and had MRI lumbar spine which showed degenerative changes at L4-5.  UPDATE 07/03/2021:  He is here for 1 year follow-up visit.  He has completed treatment for prostate cancer and doing well.  Treatment was complicated with infection and pain.  From a neurological standpoint, he is doing well and has no new complaints.  No interval TIAs or stroke.  He continues to have his loop recorder and is thinking of having it removed since the battery has died.   UPDATE 14-Sep-2021:  He was admitted to [facility] in October with acute onset of word-finding difficulty, which  he noticed especially when trying to type emails.  He went to the ER where MRI confirmed scattered left hemisphere embolic stroke.  TEE was stable, no shunt.  Zio patch monitor did not detect atrial fibrillation.  MRA did not show any large vessel stenosis/occlusion.  He had self discontinued aspirin, but was planning on starting it that day.  Since discharge, he has completed 3 weeks of ASA 81mg  + plavix 75mg  and now on aspirin 81mg  daily.  Statin therapy was also changed to crestor 20mg  which has improved his LDL from 104 down to 61.  He has noticed some improvement, but is not back to baseline. He did seek second opinion at [facility] and was seen by Dr. Rogelio Seen who recommended repeat MRA neck with plaque protocol due to concern of possible plaque at the left carotid bifurcation.  MRA performed at [facility] on 12/2 showed intraplaque hemorrhage within the distal left common carotid extending to the proximal ICA, narrowing the lumen <40%. He was suggested to increase aspirin to 325mg  and continue crestor.   Medications:  Current Outpatient Medications on File Prior to Visit  Medication Sig Dispense Refill   aspirin 81 MG tablet Take 81 mg by mouth daily.     botulinum toxin Type A (BOTOX) 100 units SOLR injection Inject 100 Units into the muscle every 3 (three) months.     cholecalciferol (VITAMIN D3) 25 MCG (1000 UNIT) tablet Take 1,000 Units by mouth daily.     fluticasone (FLONASE) 50 MCG/ACT nasal spray ONE SPRAY IN EACH NOSTRIL  TWICE DAILY ASNEEDED -USE CROSSOVER TECHNIQUE DISCUSSED (Patient taking differently: Place 1 spray into both nostrils 2 (two) times daily as needed for allergies or rhinitis.) 16 g 0   Multiple Vitamins-Minerals (PRESERVISION AREDS 2) CAPS Take 1 capsule by mouth 2 (two) times a day.     pantoprazole (PROTONIX) 40 MG tablet TAKE ONE TABLET DAILY (Patient taking differently: Take 40 mg by mouth daily.) 30 tablet 2   Polyethyl Glycol-Propyl Glycol (SYSTANE) 0.4-0.3 % GEL  ophthalmic gel Place 1 application into both eyes at bedtime.     rosuvastatin (CRESTOR) 20 MG tablet Take 1 tablet (20 mg total) by mouth daily. 90 tablet 3   tamsulosin (FLOMAX) 0.4 MG CAPS capsule Take 0.4 mg by mouth daily.     vardenafil (LEVITRA) 20 MG tablet Take 20 mg by mouth daily as needed for erectile dysfunction.     vitamin B-12 (CYANOCOBALAMIN) 1000 MCG tablet Take 1,000 mcg by mouth daily.     vitamin C (ASCORBIC ACID) 500 MG tablet Take 500 mg by mouth daily.     No current facility-administered medications on file prior to visit.    Allergies:  Allergies  Allergen Reactions   Pollen Extract Itching    Eyes itch and water     Vital Signs:  BP 130/76   Pulse 92   Ht 6\' 3"  (1.905 m)   Wt 201 lb (91.2 kg)   SpO2 100%   BMI 25.12 kg/m   Neurological Exam: MENTAL STATUS including orientation to time, place, person, recent and remote memory, attention span and concentration, language, and fund of knowledge is normal.  Mild word-finding difficulty, no dysarthria. Naming and repetition intact.  CRANIAL NERVES:   Normal conjugate, extra-ocular eye movements in all directions of gaze. Right facial asymmetry involving the forehead and cheek, asymmetrical smile on the right (due to botox administration last month for hemifacial spasm).  Mild right ptosis.  Rare right hemifacial spasm.  Palate elevates symmetrically.    MOTOR:  Motor strength is 5/5 in all extremities.  No pronator drift.  Tone is normal.    SENSORY: Intact vibration throughout  REFLEX: Reflexes are 2+/4 throughout  COORDINATION/GAIT:  Normal finger-to- nose-finger.  Intact rapid alternating movements bilaterally.  Gait narrow based and stable. Tandem gait intact.  Data: MRI/A brain and neck 03/18/2015: 1. Mild white matter signal changes in the left posterior centrum semiovale with subtle diffusion signal changes suggestive of subacute white matter infarct. No mass effect or hemorrhage. 2. Otherwise  normal for age noncontrast MRI appearance of the brain. 3. Left greater than right carotid bifurcation atherosclerosis with no hemodynamically significant stenosis in the neck. 4.  Negative intracranial MRA.  Lab Results  Component Value Date   CHOL 139 09/05/2021   HDL 49.30 09/05/2021   LDLCALC 61 09/05/2021   LDLDIRECT 172.0 07/24/2007   TRIG 143.0 09/05/2021   CHOLHDL 3 09/05/2021   Lab Results  Component Value Date   HGBA1C 5.8 07/31/2021   Echocardiogram 03/14/2015: - Left ventricle: The cavity size was normal. Systolic function was   normal. The estimated ejection fraction was in the range of 55%  to 60%. Wall motion was normal; there were no regional wall motion abnormalities. Left ventricular diastolic function   parameters were normal. - Atrial septum: There was a moderate right-to-left atrial level shunt, in the baseline state. There was redundancy of the septum,   with borderline criteria for aneurysm.  TEE 01/13/2018: - Left ventricle: The cavity size was normal. Wall  thickness was normal. Systolic function was normal. The estimated ejection   fraction was in the range of 55% to 60%. Wall motion was normal; there were no regional wall motion abnormalities.  - Left atrium: No evidence of thrombus in the atrial cavity or appendage. - Right atrium: No evidence of thrombus in the atrial cavity or appendage. - Atrial septum: There was a small secundum atrial septal defect. Doppler showed a small bidirectional atrial level shunt, in the   baseline state. 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 Acute infarcts in the left frontal and parietal lobes, with the largest  area involving the left posteroinferior frontal cortex. Multiple punctate infarcts in multiple vascular territories suggest embolic etiology. 2. No intracranial large vessel occlusion. 3. No hemodynamically significant stenosis in the neck.   IMPRESSION/PLAN: 1.  Embolic stroke involving the left hemisphere due to LICA intraplaque hemorrhage as seen on dedicated plaque protocol MRA at the [facility]. Of note, stroke occurred in the setting of being off antiplatelet therapy. With this new information, I personally reviewed his MRA from 07/27/2021 with neuroradiology and we agree that there is mild intralumen hyperintensity which in hindsight corresponds to area intraplaque hemorrage.  Cardiac monitor does not show atrial fibrillation and there is no intracranial stenosis.  He has completed dual antiplatelet therapy and now on aspirin 81mg  daily. I have no objection to him increasing the dose to aspirin 325mg , if suggested by Dr. Rogelio Seen.  He is also on crestor 20mg , which has nicely lowered his LDL. The goal is plaque stabilization. We have the option of medical management which is what we are currently doing or seek vascular surgery opinion for left CEA/stenting.   He is scheduled to review his results with Dr. Rogelio Seen on 12/19 and based on this, will let me know whether to pursue surgical evaluation.  2. History of small left posterior centrum semiovale stroke manifesting with transient R arm weakness and hemisensory loss in April 2016. 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  He is s/p PFO closure in 2019.  No cardiac arrhythmia on loop recorder. I did go back and review his MRA neck from 2016, however there is motion artifact making it difficult to see subtle changes but I do not see convincing changes that shows left ICA plaque.   3. Right hemifacial spasm, followed by Dr. Zonia Kief in [place], [place]  Return to clinic in 1 year  Total time spent reviewing  records, interview, history/exam, documentation, counseling, and coordination of care on day of encounter:  45 min   Thank you for allowing me to participate in patient's care.  If I can answer any additional questions, I would be pleased to do so.    Sincerely,    Cheyna Retana K. Posey Pronto, DO

## 2021-09-20 ENCOUNTER — Other Ambulatory Visit: Payer: Self-pay | Admitting: Internal Medicine

## 2021-09-27 ENCOUNTER — Encounter: Payer: Self-pay | Admitting: Cardiovascular Disease

## 2021-09-28 ENCOUNTER — Other Ambulatory Visit: Payer: Self-pay

## 2021-09-28 ENCOUNTER — Encounter: Payer: Self-pay | Admitting: Cardiovascular Disease

## 2021-09-28 ENCOUNTER — Ambulatory Visit (INDEPENDENT_AMBULATORY_CARE_PROVIDER_SITE_OTHER): Payer: Commercial Managed Care - PPO | Admitting: Cardiovascular Disease

## 2021-09-28 VITALS — BP 132/84 | HR 76 | Ht 75.0 in | Wt 204.0 lb

## 2021-09-28 DIAGNOSIS — I6522 Occlusion and stenosis of left carotid artery: Secondary | ICD-10-CM | POA: Diagnosis not present

## 2021-09-28 DIAGNOSIS — I253 Aneurysm of heart: Secondary | ICD-10-CM

## 2021-09-28 DIAGNOSIS — I6930 Unspecified sequelae of cerebral infarction: Secondary | ICD-10-CM | POA: Diagnosis not present

## 2021-09-28 DIAGNOSIS — Z8673 Personal history of transient ischemic attack (TIA), and cerebral infarction without residual deficits: Secondary | ICD-10-CM

## 2021-09-28 DIAGNOSIS — Q2112 Patent foramen ovale: Secondary | ICD-10-CM | POA: Diagnosis not present

## 2021-09-28 DIAGNOSIS — E782 Mixed hyperlipidemia: Secondary | ICD-10-CM

## 2021-09-28 DIAGNOSIS — Z4509 Encounter for adjustment and management of other cardiac device: Secondary | ICD-10-CM

## 2021-09-28 NOTE — Progress Notes (Signed)
Patient ID: Steve [name], male   DOB: [birth year], 70 y.o.   MRN: [id number]     Cardiology Office Note    Date:  09/28/2021   ID:  Steve [name], DOB 15-Apr-1951, MRN [id number]  PCP:  Binnie Rail, MD  Cardiologist:   Sanda Klein, MD   Chief Complaint  Patient presents with   history of stroke    History of Present Illness:  Steve [name] is a 70 y.o. male who presents in follow-up after closure (Amplatzer 25 mm, Dr. Burt Knack,  in June 2019) of a large PFO with atrial septal aneurysm felt to be the cause of his previous ischemic stroke that occurred in 2016.  Implantable loop recorder monitoring has not shown evidence of atrial fibrillation.  He had a new left MCA ischemic event in October.  This manifested as difficulty finding words and difficulty spelling words correctly white while typing on his computer.  For the most part the complaints have resolved.  His echocardiogram shows that the PFO closure is still effective, without any right to left shunt.  A wearable monitor showed no evidence of arrhythmia (previous monitoring with an implantable loop recorder for 3 years did not show atrial fibrillation either).  MRI did show minimal stenosis of the left common carotid artery, around 40% at the bifurcation extending slightly into the internal carotid.  He went to the [facility] for further evaluation.  They thought that the left carotid artery plaque showed evidence of instability and hemorrhage within the plaque, suggesting that this was indeed the reason for his recent ischemic event.  He is on increased dose of statin and the [facility] specialist recommended increasing the aspirin to 325 mg daily.  He has not had any new neurological events.  The patient specifically denies any chest pain at rest exertion, dyspnea at rest or with exertion, orthopnea, paroxysmal nocturnal dyspnea, syncope, palpitations, focal neurological deficits, intermittent claudication, lower extremity  edema, unexplained weight gain, cough, hemoptysis or wheezing.  His loop recorder has detected a single 3-second asymptomatic pause during sleep in May 2018.  Atrial fibrillation was never detected by the device.  It has reached end of service.  He would like to have it extracted in the near future.  Past Medical History:  Diagnosis Date   Arthritis    Right shoulder   Diverticulosis 2013   Moderate    First degree AV block 12/09/2018   noted on EKG   GERD (gastroesophageal reflux disease)    Grade I diastolic dysfunction 97/41/6384   Noted on ECHO    Hemifacial spasm    History of loop recorder    Hx of colonic polyp 2008 & 2013   X2   Hyperlipidemia    Internal hemorrhoids 2013   PFO (patent foramen ovale)    History of    Prostate cancer (HCC)    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   Sleep apnea    Resolved   Squamous cell skin cancer    Dr.King [place]   Stricture esophagus 2013    dilation X 1   Stroke ([place]) 2016   left semiovale stroke    TIA (transient ischemic attack) 01/2015   Ulcerative esophagitis 2013    Past Surgical History:  Procedure Laterality Date   BUBBLE STUDY  08/11/2021   Procedure: BUBBLE STUDY;  Surgeon: Skeet Latch, MD;  Location: [place];  Service: Cardiovascular;;   COLONOSCOPY W/ POLYPECTOMY  2013, 2018 last done   tubular  adenoma   COLONOSCOPY W/ POLYPECTOMY  2008   hyperplastic   CYSTOSCOPY N/A 09/19/2020   Procedure: CYSTOSCOPY FLEXIBLE;  Surgeon: Franchot Gallo, MD;  Location: [facility];  Service: Urology;  Laterality: N/A;   EP IMPLANTABLE DEVICE N/A 01/03/2016   Procedure: Loop Recorder Insertion;  Surgeon: Sanda Klein, MD;  Location: [place] [facility];  Service: Cardiovascular;  Laterality: N/A;   MOHS SURGERY     x 3   NASAL SINUS SURGERY  15 yrs ago   PATENT FORAMEN OVALE(PFO) CLOSURE N/A 03/27/2018   Procedure: PATENT FORAMEN OVALE (PFO) CLOSURE;  Surgeon: Sherren Mocha, MD;   Location: [place] [facility];  Service: Cardiovascular;  Laterality: N/A;   PROSTATE BIOPSY  2021   RADIOACTIVE SEED IMPLANT N/A 09/19/2020   Procedure: RADIOACTIVE SEED IMPLANT/BRACHYTHERAPY IMPLANT;  Surgeon: Franchot Gallo, MD;  Location: [facility];  Service: Urology;  Laterality: N/A;  90 MINS   REVERSE SHOULDER ARTHROPLASTY Right 03/10/2019   Procedure: REVERSE SHOULDER ARTHROPLASTY;  Surgeon: Marchia Bond, MD;  Location: [facility];  Service: Orthopedics;  Laterality: Right;   ROTATOR CUFF REPAIR Left yrs ago   SPACE OAR INSTILLATION N/A 09/19/2020   Procedure: SPACE OAR INSTILLATION;  Surgeon: Franchot Gallo, MD;  Location: [facility];  Service: Urology;  Laterality: N/A;   TEE WITHOUT CARDIOVERSION N/A 01/06/2018   Procedure: TRANSESOPHAGEAL ECHOCARDIOGRAM (TEE);  Surgeon: Sanda Klein, MD;  Location: [place];  Service: Cardiovascular;  Laterality: N/A;   TEE WITHOUT CARDIOVERSION N/A 08/11/2021   Procedure: TRANSESOPHAGEAL ECHOCARDIOGRAM (TEE);  Surgeon: Skeet Latch, MD;  Location: [facility];  Service: Cardiovascular;  Laterality: N/A;   torn biceps tendon Left yrs ago   surgical repair   uvulectomy & tonsillectomy  2008   Dr Wilburn Cornelia    Outpatient Medications Prior to Visit  Medication Sig Dispense Refill   aspirin 325 MG tablet Take 325 mg by mouth daily. 1 Tablet Daily in The Morning     botulinum toxin Type A (BOTOX) 100 units SOLR injection Inject 100 Units into the muscle every 3 (three) months.     cholecalciferol (VITAMIN D3) 25 MCG (1000 UNIT) tablet Take 1,000 Units by mouth daily.     fluticasone (FLONASE) 50 MCG/ACT nasal spray ONE SPRAY IN EACH NOSTRIL TWICE DAILY ASNEEDED -USE CROSSOVER TECHNIQUE DISCUSSED (Patient taking differently: Place 1 spray into both nostrils 2 (two) times daily as needed for allergies or rhinitis.) 16 g 0   Multiple Vitamins-Minerals (PRESERVISION AREDS 2) CAPS Take 1 capsule by mouth 2 (two)  times a day.     pantoprazole (PROTONIX) 40 MG tablet TAKE ONE TABLET DAILY 30 tablet 2   Polyethyl Glycol-Propyl Glycol (SYSTANE) 0.4-0.3 % GEL ophthalmic gel Place 1 application into both eyes at bedtime.     rosuvastatin (CRESTOR) 20 MG tablet Take 1 tablet (20 mg total) by mouth daily. 90 tablet 3   vardenafil (LEVITRA) 20 MG tablet Take 20 mg by mouth daily as needed for erectile dysfunction.     vitamin B-12 (CYANOCOBALAMIN) 1000 MCG tablet Take 1,000 mcg by mouth daily.     vitamin C (ASCORBIC ACID) 500 MG tablet Take 500 mg by mouth daily.     aspirin 81 MG tablet Take 81 mg by mouth daily.     tamsulosin (FLOMAX) 0.4 MG CAPS capsule Take 0.4 mg by mouth daily.     No facility-administered medications prior to visit.     Allergies:   Pollen extract   Social  History   Socioeconomic History   Marital status: Married    Spouse name: Not on file   Number of children: Not on file   Years of education: Not on file   Highest education level: Not on file  Occupational History   Occupation: [employer]  Tobacco Use   Smoking status: Former    Packs/day: 0.50    Years: 10.00    Pack years: 5.00    Types: Cigarettes    Quit date: 19    Years since quitting: 40.0   Smokeless tobacco: Never   Tobacco comments:    smoked 1972-1982, up to 1/2 ppd  Vaping Use   Vaping Use: Never used  Substance and Sexual Activity   Alcohol use: Yes    Alcohol/week: 5.0 - 6.0 standard drinks    Types: 5 - 6 Standard drinks or equivalent per week    Comment:  2 -3 drinks per week   Drug use: No   Sexual activity: Not Currently  Other Topics Concern   Not on file  Social History Narrative   GETS REG EXERCISE 4x weekly   NO DIET   Lives with wife in a 2 story home.  Has 2 children.     Works as a Insurance underwriter.     Education: college   Right Handed   Drinks Caffeine    Lives in a two story home   Social Determinants of Health   Financial Resource Strain: Not on  file  Food Insecurity: Not on file  Transportation Needs: Not on file  Physical Activity: Not on file  Stress: Not on file  Social Connections: Not on file     Family History:  The patient's family history includes Breast cancer in his mother; Diabetes in his daughter; Healthy in his brother; Heart attack in his maternal grandfather; Hyperlipidemia in his son; Prostate cancer in his father; Stroke in his father and paternal uncle; Uterine cancer in his mother.   ROS:   Please see the history of present illness.    ROS All other systems are reviewed and are negative   PHYSICAL EXAM:   VS:  BP 132/84    Pulse 76    Ht 6\' 3"  (1.905 m)    Wt 204 lb (92.5 kg)    SpO2 98%    BMI 25.50 kg/m      General: Alert, oriented x3, no distress, appears well, younger than stated age. Head: no evidence of trauma, PERRL, EOMI, no exophtalmos or lid lag, no myxedema, no xanthelasma; normal ears, nose and oropharynx Neck: normal jugular venous pulsations and no hepatojugular reflux; brisk carotid pulses without delay and no carotid bruits Chest: clear to auscultation, no signs of consolidation by percussion or palpation, normal fremitus, symmetrical and full respiratory excursions Cardiovascular: normal position and quality of the apical impulse, regular rhythm, normal first and second heart sounds, no murmurs, rubs or gallops Abdomen: no tenderness or distention, no masses by palpation, no abnormal pulsatility or arterial bruits, normal bowel sounds, no hepatosplenomegaly Extremities: no clubbing, cyanosis or edema; 2+ radial, ulnar and brachial pulses bilaterally; 2+ right femoral, posterior tibial and dorsalis pedis pulses; 2+ left femoral, posterior tibial and dorsalis pedis pulses; no subclavian or femoral bruits Neurological: grossly nonfocal Psych: Normal mood and affect   Wt Readings from Last 3 Encounters:  09/28/21 204 lb (92.5 kg)  09/13/21 201 lb (91.2 kg)  08/11/21 193 lb (87.5 kg)       Studies/Labs  Reviewed:   TEE 08/11/2021:  1. Left ventricular ejection fraction, by estimation, is 60 to 65%. The  left ventricle has normal function. The left ventricle has no regional  wall motion abnormalities.   2. Right ventricular systolic function is normal. The right ventricular  size is normal.   3. No left atrial/left atrial appendage thrombus was detected.   4. There is an Amplatzer closure device in place with no evidence of  atrial shunting or thrombus. Agitated saline contrast bubble study was  negative, with no evidence of any interatrial shunt.   5. The mitral valve is normal in structure. Trivial mitral valve  regurgitation. No evidence of mitral stenosis.   6. The aortic valve is tricuspid. Aortic valve regurgitation is not  visualized. No aortic stenosis is present.   7. The inferior vena cava is normal in size with greater than 50%  respiratory variability, suggesting right atrial pressure of 3 mmHg.   Conclusion(s)/Recommendation(s): Normal biventricular function without  evidence of hemodynamically significant valvular heart disease.   EKG:  EKG is  ordered today.  Normal sinus rhythm with first-degree AV block (PR interval around 220 ms), otherwise normal tracing, QTC 436 ms Recent Labs: 07/28/2021: Magnesium 2.3 07/31/2021: TSH 4.50 08/02/2021: BUN 14; Creatinine, Ser 0.98; Hemoglobin 16.2; Platelets 190; Potassium 4.7; Sodium 140 09/05/2021: ALT 27   Lipid Panel    Component Value Date/Time   CHOL 139 09/05/2021 0802   CHOL 183 02/07/2015 1535   TRIG 143.0 09/05/2021 0802   TRIG 81 02/07/2015 1535   HDL 49.30 09/05/2021 0802   HDL 63 02/07/2015 1535   CHOLHDL 3 09/05/2021 0802   VLDL 28.6 09/05/2021 0802   LDLCALC 61 09/05/2021 0802   LDLCALC 104 (H) 02/07/2015 1535   LDLDIRECT 172.0 07/24/2007 0000    ASSESSMENT:    1. Carotid artery plaque, left   2. Patent foramen ovale with atrial septal aneurysm, s/p closure 6/19   3. Chronic left  arterial ischemic stroke, ICA (internal carotid artery)   4. Mixed hyperlipidemia   5. Encounter for loop recorder at end of battery life      PLAN:  In order of problems listed above:   Common carotid artery stenosis: Despite minor stenosis, there is evidence of plaque vulnerability/intraplaque hemorrhage.  On higher dose aspirin.  Statin dose has also been increased.   PFO s/p closure 2019: Asymptomatic.  The closure remains effective.  MRI safe device.  Does not require endocarditis prophylaxis anymore. Stroke: Recurrent left carotid distribution stroke likely due to vulnerable left common carotid plaque, manifesting as transient expressive aphasia/agraphia. HLP: recent LDL 61 on the current dose of statin.  Continue. ILR: The device is no longer of service.  He did express a desire to have this removed in the near future.  We will schedule the procedure for him.    Medication Adjustments/Labs and Tests Ordered: Current medicines are reviewed at length with the patient today.  Concerns regarding medicines are outlined above.  Medication changes, Labs and Tests ordered today are listed in the Patient Instructions below. Patient Instructions  Medication Instructions:  No changes *If you need a refill on your cardiac medications before your next appointment, please call your pharmacy*   Lab Work: None ordered If you have labs (blood work) drawn today and your tests are completely normal, you will receive your results only by: [place] (if you have MyChart) OR A paper copy in the mail If you have any lab test that is abnormal  or we need to change your treatment, we will call you to review the results.   Testing/Procedures: None ordered   Follow-Up: At [facility], you and your health needs are our priority.  As part of our continuing mission to provide you with exceptional heart care, we have created designated Provider Care Teams.  These Care Teams include your  primary Cardiologist (physician) and Advanced Practice Providers (APPs -  Physician Assistants and Nurse Practitioners) who all work together to provide you with the care you need, when you need it.  We recommend signing up for the patient portal called "MyChart".  Sign up information is provided on this After Visit Summary.  MyChart is used to connect with patients for Virtual Visits (Telemedicine).  Patients are able to view lab/test results, encounter notes, upcoming appointments, etc.  Non-urgent messages can be sent to your provider as well.   To learn more about what you can do with MyChart, go to [email or web address].    Your next appointment:   12 month(s)  The format for your next appointment:   In Person  Provider:   Sanda Klein, MD      [facility] [place], [address] [place]  [place], [place] [address]  Phone: [phone] Fax: [phone]   You are scheduled for a Loop Recorder Explant on 10/23/2021  at [facility] with Dr. Sallyanne Kuster. This will take place at [address], [address].    Please arrive at your appointment 15-20 minutes early.   You do not need to be fasting.   The procedure is performed with local anesthesia. You will not receive sedatives nor will an IV be placed.   Wash your chest and neck with the surgical soap the evening before and the morning of your procedure. Please following the washing instructions provided.   As with all surgical implants, there is a small risk of infection. If an infection occurs, the device will be removed. To help reduce the risk, please use the surgical scrub provided. Additional antiseptic precautions will be taken at the time of the procedure.   Please bring your insurance cards.   *Please note that scheduled loop recorder implants may need to be rescheduled if Dr. Sallyanne Kuster has a procedure urgently added on at the hospital   Preparing for the Procedure  Before the procedure, you  can play an important role. Because skin is not sterile, your skin needs to be as free of germs as possible. You can reduce the number of germs on your skin by washing with CHG (chlorhexidine gluconate) Soap before the procedure. CHG is an antiseptic cleaner which kills germs and bonds with the skin to continue killing germs even after washing.  Please do not use if you have an allergy to CHG or antibacterial soaps. If your skin becomes reddened/irritated, STOP using the CHG.  DO NOT SHAVE (including legs and underarms) for at least 48 hours prior to first CHG shower. It is OK to shave your face.  Please follow these instructions carefully: Shower the night before the procedure and the morning of with CHG Soap. If you chose to wash your hair, wash your hair first as usual with your normal shampoo/conditioner. After you shampoo/condition, rinse you hair and body thoroughly to remove shampoo/conditioner. Use CHG as you would any other liquid soap. You can apply CHG directly to the skin and wash gently with a loofah or a clean washcloth. Apply the CHG Soap to your body ONLY FROM  THE NECK DOWN. Do not use on open wounds or open sores. Avoid contact with your eyes, ears, mouth, and genitals (private parts).  Wash thoroughly, paying special attention to the area where your surgery will be performed. Thoroughly rinse your body with warm water from the neck down. DO NOT shower/wash with your normal soap after using and rinsing off the CHG Soap. Pat yourself dry with a clean towel. Wear clean pajamas to bed. Place clean sheets on your bed the night of your first shower and do not sleep with pets..  Day of Surgery: Shower with the CHG Soap following the instructions listed above. DO NOT apply deodorants or lotions.        Signed, Sanda Klein, MD  09/28/2021 5:41 PM    [place] [address], [place], [place]  [address] Phone: [phone]; Fax: [phone]

## 2021-09-28 NOTE — Patient Instructions (Signed)
Medication Instructions:  No changes *If you need a refill on your cardiac medications before your next appointment, please call your pharmacy*   Lab Work: None ordered If you have labs (blood work) drawn today and your tests are completely normal, you will receive your results only by: [place] (if you have MyChart) OR A paper copy in the mail If you have any lab test that is abnormal or we need to change your treatment, we will call you to review the results.   Testing/Procedures: None ordered   Follow-Up: At [facility], you and your health needs are our priority.  As part of our continuing mission to provide you with exceptional heart care, we have created designated Provider Care Teams.  These Care Teams include your primary Cardiologist (physician) and Advanced Practice Providers (APPs -  Physician Assistants and Nurse Practitioners) who all work together to provide you with the care you need, when you need it.  We recommend signing up for the patient portal called "MyChart".  Sign up information is provided on this After Visit Summary.  MyChart is used to connect with patients for Virtual Visits (Telemedicine).  Patients are able to view lab/test results, encounter notes, upcoming appointments, etc.  Non-urgent messages can be sent to your provider as well.   To learn more about what you can do with MyChart, go to [email or web address].    Your next appointment:   12 month(s)  The format for your next appointment:   In Person  Provider:   Sanda Klein, MD      [facility] [place], [place]  [place], [place] [address]  Phone: [phone] Fax: [phone]   You are scheduled for a Loop Recorder Explant on 10/23/2021  at [facility] with Dr. Sallyanne Kuster. This will take place at [place], [address].    Please arrive at your appointment 15-20 minutes early.   You do not need to be fasting.   The procedure is  performed with local anesthesia. You will not receive sedatives nor will an IV be placed.   Wash your chest and neck with the surgical soap the evening before and the morning of your procedure. Please following the washing instructions provided.   As with all surgical implants, there is a small risk of infection. If an infection occurs, the device will be removed. To help reduce the risk, please use the surgical scrub provided. Additional antiseptic precautions will be taken at the time of the procedure.   Please bring your insurance cards.   *Please note that scheduled loop recorder implants may need to be rescheduled if Dr. Sallyanne Kuster has a procedure urgently added on at the hospital   Preparing for the Procedure  Before the procedure, you can play an important role. Because skin is not sterile, your skin needs to be as free of germs as possible. You can reduce the number of germs on your skin by washing with CHG (chlorhexidine gluconate) Soap before the procedure. CHG is an antiseptic cleaner which kills germs and bonds with the skin to continue killing germs even after washing.  Please do not use if you have an allergy to CHG or antibacterial soaps. If your skin becomes reddened/irritated, STOP using the CHG.  DO NOT SHAVE (including legs and underarms) for at least 48 hours prior to first CHG shower. It is OK to shave your face.  Please follow these instructions carefully: Shower the night before the procedure and the  morning of with CHG Soap. If you chose to wash your hair, wash your hair first as usual with your normal shampoo/conditioner. After you shampoo/condition, rinse you hair and body thoroughly to remove shampoo/conditioner. Use CHG as you would any other liquid soap. You can apply CHG directly to the skin and wash gently with a loofah or a clean washcloth. Apply the CHG Soap to your body ONLY FROM THE NECK DOWN. Do not use on open wounds or open sores. Avoid contact with your  eyes, ears, mouth, and genitals (private parts).  Wash thoroughly, paying special attention to the area where your surgery will be performed. Thoroughly rinse your body with warm water from the neck down. DO NOT shower/wash with your normal soap after using and rinsing off the CHG Soap. Pat yourself dry with a clean towel. Wear clean pajamas to bed. Place clean sheets on your bed the night of your first shower and do not sleep with pets..  Day of Surgery: Shower with the CHG Soap following the instructions listed above. DO NOT apply deodorants or lotions.

## 2021-09-29 ENCOUNTER — Encounter: Payer: Self-pay | Admitting: Neurology

## 2021-10-23 ENCOUNTER — Ambulatory Visit: Payer: Commercial Managed Care - PPO | Admitting: Cardiovascular Disease

## 2021-10-23 ENCOUNTER — Other Ambulatory Visit: Payer: Self-pay

## 2021-10-23 DIAGNOSIS — Z95818 Presence of other cardiac implants and grafts: Secondary | ICD-10-CM | POA: Diagnosis not present

## 2021-10-23 DIAGNOSIS — Z4509 Encounter for adjustment and management of other cardiac device: Secondary | ICD-10-CM

## 2021-10-23 MED ORDER — LIDOCAINE-EPINEPHRINE 2 %-1:100000 IJ SOLN: 10.0000 mL | Freq: Once | INTRAMUSCULAR | Status: DC

## 2021-10-23 NOTE — Patient Instructions (Signed)
Discharge Instructions for  Loop Recorder Explant/Implant    Follow up: Keep your wound check appointment on 11/06/21 at 11:40. This will be a virtual visit only.  If you have any questions or concerns, please call the office at [phone].  ACTIVITY No restrictions. DO wear your seatbelt, even if it crosses over the site.   WOUND CARE Keep the wound area clean and dry.  Remove the dressing the day after (usually 24 hours after the procedure). DO NOT SUBMERGE UNDER WATER UNTIL FULLY HEALED (no tub baths, hot tubs, swimming pools, etc.).  You  may shower or take a sponge bath after the dressing is removed. DO NOT SOAK the area and do not allow the shower to directly spray on the site. If you have tape/steri-strips on your wound, these will fall off; do not pull them off prematurely.   No bandage is needed on the site.  DO  NOT apply any creams, oils, or ointments to the wound area. If you notice any drainage or discharge from the wound, any swelling, excessive redness or bruising at the site, or if you develop a fever > 101? F, call the office at once at [phone].

## 2021-10-23 NOTE — Progress Notes (Signed)
Patient presented for implantable loop recorder extraction (please refer to 09/28/2021 note) .  The risk and benefits of the procedure were discussed in detail and he agreed to proceed.  The left parasternal area was prepped and draped in sterile fashion.  The site of the loop recorder, and the fourth intercostal space of the left of the sternum was anesthetized with 1% lidocaine with 1/100,000 epinephrine, 10 mL.  A 1 cm incision was made with the scalpel, in the vicinity of the previous implantation scar.  The loop recorder was easily captured with a forceps and extracted.  Hemostasis was achieved with manual pressure.  The wound was closed with Steri-Strips over which a sterile dressing was placed.  No immediate complications occurred.  Estimated blood loss 0.  Wound care instructions were provided.

## 2021-11-06 ENCOUNTER — Telehealth (INDEPENDENT_AMBULATORY_CARE_PROVIDER_SITE_OTHER): Payer: Commercial Managed Care - PPO | Admitting: Cardiovascular Disease

## 2021-11-06 DIAGNOSIS — Z4509 Encounter for adjustment and management of other cardiac device: Secondary | ICD-10-CM

## 2021-11-06 NOTE — Progress Notes (Signed)
Follow-up visit for wound check after extraction of implantable loop recorder.  The site has healed well.  Steri-Strips are off.  There is a barely visible scar and there is no evidence of hematoma, redness, discharge or swelling.

## 2021-12-12 NOTE — Progress Notes (Signed)
d   Follow-up Visit   Date: 12/13/21    Steve [name] MRN: [id number] DOB: [birth year]   Interim History: Steve [name] is a 71 y.o. right-handed Caucasian male with right hemifacial spasm, hyperlipidemia, BPH, GERD, and left semiovale stroke (2016, manifesting with right arm weakness and facial numbness), prostate cancer (2021) returning to the clinic for with left embolic stroke (42/3536).  History of present illness: On April 23rd 2015, he woke up with right arm heaviness/weakness and right sided paresthesias. There was no weakness of the right lower extremity or the face.  Symptoms resolved within about 6 hours.  He has noticed that his signature and hand writing is showing subtle differences.  He stopped taking aspirin several years ago and restarted aspirin '81mg'$  when these symptoms started.  MRI brain showed small left centrum semiovale stroke.  There is no evidence of large vessel occlusion. Echocardiogram is normal except moderate right-to-left shunt without atrial septal aneurysm. He had PFO closure on 03/27/18.  He has seen Dr. Erling Cruz in the late 1990s for hemifacial spasm and gets botox injected.  In 2019, he began having left sided tightness of the thigh . Sometimes, he has difficulty raising the left leg, such as with wearing pants.  There is some associated low back pain.  He has seen [facility] in the past and had MRI lumbar spine which showed degenerative changes at L4-5.  UPDATE 07/03/2021:  He is here for 1 year follow-up visit.  He has completed treatment for prostate cancer and doing well.  Treatment was complicated with infection and pain.  From a neurological standpoint, he is doing well and has no new complaints.  No interval TIAs or stroke.  He continues to have his loop recorder and is thinking of having it removed since the battery has died.   UPDATE Sep 16, 2021:  He was admitted to [facility] in October with acute onset of word-finding difficulty, which he  noticed especially when trying to type emails.  He went to the ER where MRI confirmed scattered left hemisphere embolic stroke.  TEE was stable, no shunt.  Zio patch monitor did not detect atrial fibrillation.  MRA did not show any large vessel stenosis/occlusion.  He had self discontinued aspirin, but was planning on starting it that day.  Since discharge, he has completed 3 weeks of ASA '81mg'$  + plavix '75mg'$  and now on aspirin '81mg'$  daily.  Statin therapy was also changed to crestor '20mg'$  which has improved his LDL from 104 down to 61.  He has noticed some improvement, but is not back to baseline. He did seek second opinion at [facility] and was seen by Dr. Rogelio Seen who recommended repeat MRA neck with plaque protocol due to concern of possible plaque at the left carotid bifurcation.  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 He was suggested to increase aspirin to '325mg'$  and continue crestor.   UPDATE 12/12/2021:  He is here for follow-up visit.  He continues to have mild difficulty with word-finding, but overall is doing very well.  No new neurological symptoms or complaints.  He is taking aspirin '325mg'$  daily and crestor '20mg'$  daily. Blood pressure is well-controlled.  He will be planning to have repeat MRA plaque protocol at [facility] in May/June.  His daughter is getting married on 4/29, which he is looking forward to.   Medications:  Current Outpatient Medications on  File Prior to Visit  Medication Sig Dispense Refill   aspirin 325 MG tablet Take 325 mg by mouth daily. 1 Tablet Daily in The Morning     botulinum toxin Type A (BOTOX) 100 units SOLR injection Inject 100 Units into the muscle every 3 (three) months.     cholecalciferol (VITAMIN D3) 25 MCG (1000 UNIT) tablet Take 1,000 Units by mouth daily.     fluticasone (FLONASE) 50 MCG/ACT nasal spray ONE SPRAY IN EACH NOSTRIL TWICE DAILY  ASNEEDED -USE CROSSOVER TECHNIQUE DISCUSSED (Patient taking differently: Place 1 spray into both nostrils 2 (two) times daily as needed for allergies or rhinitis.) 16 g 0   Multiple Vitamins-Minerals (PRESERVISION AREDS 2) CAPS Take 1 capsule by mouth 2 (two) times a day.     pantoprazole (PROTONIX) 40 MG tablet TAKE ONE TABLET DAILY 30 tablet 2   Polyethyl Glycol-Propyl Glycol (SYSTANE) 0.4-0.3 % GEL ophthalmic gel Place 1 application into both eyes at bedtime.     rosuvastatin (CRESTOR) 20 MG tablet Take 1 tablet (20 mg total) by mouth daily. 90 tablet 3   vardenafil (LEVITRA) 20 MG tablet Take 20 mg by mouth daily as needed for erectile dysfunction.     vitamin B-12 (CYANOCOBALAMIN) 1000 MCG tablet Take 1,000 mcg by mouth daily.     vitamin C (ASCORBIC ACID) 500 MG tablet Take 500 mg by mouth daily.     Current Facility-Administered Medications on File Prior to Visit  Medication Dose Route Frequency Provider Last Rate Last Admin   lidocaine-EPINEPHrine (XYLOCAINE W/EPI) 2 %-1:100000 (with pres) injection 10 mL  10 mL Intradermal Once Croitoru, Mihai, MD        Allergies:  Allergies  Allergen Reactions   Pollen Extract Itching    Eyes itch and water     Vital Signs:  BP 130/77    Pulse 73    Ht '6\' 3"'$  (1.905 m)    Wt 200 lb (90.7 kg)    SpO2 96%    BMI 25.00 kg/m   Neurological Exam: MENTAL STATUS including orientation to time, place, person, recent and remote memory, attention span and concentration, language, and fund of knowledge is normal.  No dysarthria.   CRANIAL NERVES:   Normal conjugate, extra-ocular eye movements in all directions of gaze. Right facial asymmetry involving the forehead and cheek, asymmetrical smile on the right (due to botox administration for hemifacial spasm).  Mild right ptosis.  No right hemifacial spasm observed today.    MOTOR:  Motor strength is 5/5 in all extremities.  No pronator drift.  Tone is normal.    SENSORY: Intact vibration  throughout  REFLEX: Reflexes are 2+/4 throughout  COORDINATION/GAIT:  Normal finger-to- nose-finger.  Intact rapid alternating movements bilaterally.  Gait narrow based and stable. Tandem and stressed gait intact.  Data: MRI/A brain and neck 03/18/2015: 1. Mild white matter signal changes in the left posterior centrum semiovale with subtle diffusion signal changes suggestive of subacute white matter infarct. No mass effect or hemorrhage. 2. Otherwise normal for age noncontrast MRI appearance of the brain. 3. Left greater than right carotid bifurcation atherosclerosis with no hemodynamically significant stenosis in the neck. 4.  Negative intracranial MRA.  Lab Results  Component Value Date   CHOL 139 09/05/2021   HDL 49.30 09/05/2021   LDLCALC 61 09/05/2021   LDLDIRECT 172.0 07/24/2007   TRIG 143.0 09/05/2021   CHOLHDL 3 09/05/2021   Lab Results  Component Value Date   HGBA1C 5.8 07/31/2021   Echocardiogram  03/14/2015: - Left ventricle: The cavity size was normal. Systolic function was   normal. The estimated ejection fraction was in the range of 55%  to 60%. Wall motion was normal; there were no regional wall motion abnormalities. Left ventricular diastolic function   parameters were normal. - Atrial septum: There was a moderate right-to-left atrial level shunt, in the baseline state. There was redundancy of the septum,   with borderline criteria for aneurysm.  TEE 01/13/2018: - Left ventricle: The cavity size was normal. Wall thickness was normal. Systolic function was normal. The estimated ejection   fraction was in the range of 55% to 60%. Wall motion was normal; there were no regional wall motion abnormalities.  - Left atrium: No evidence of thrombus in the atrial cavity or appendage. - Right atrium: No evidence of thrombus in the atrial cavity or appendage. - Atrial septum: There was a small secundum atrial septal defect. Doppler showed a small bidirectional atrial level shunt,  in the   baseline state. 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 Acute infarcts in the left frontal and parietal lobes, with the largest area involving the left posteroinferior frontal cortex. Multiple punctate infarcts in multiple vascular territories suggest embolic etiology. 2. No intracranial large vessel occlusion. 3. No hemodynamically significant stenosis in the neck.   IMPRESSION/PLAN: 1.  Left hemisphere embolic stroke (64/1583) manifesting with expressive aphasia.  Etiology - LICA intraplaque hemorrhage as seen on dedicated plaque protocol MRA at the [facility] in the setting of being off antiplatelet therapy.  Clinically, he is doing very well with mild word-finding difficulty.  He completed dual antiplatelet therapy and has been on monotherapy with aspirin.  - Continue aspirin '325mg'$  daily  - Continue crestor '20mg'$  daily (LDL 61)  - Repeat MRA neck plaque protocol at [facility] in May/June.  He will contact Dr. Edwyna Ready office to schedule this   - If there is stabilization of LICA plaque, continue medical management  - If there continues to be intraplaque hemorrage that is expanding or he has new neurological symptoms, refer to vascular surgery for left CEA/stenting.    2. History of small left posterior centrum semiovale stroke manifesting with transient R arm weakness and hemisensory loss in April 2016. 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  He is s/p PFO closure in 2019.  No cardiac arrhythmia on loop recorder.  3. Right hemifacial spasm, followed by Dr. Zonia Kief in [place], [place]  Return to clinic in 6 months   Thank you  for allowing me to participate in patient's care.  If I can answer any additional questions, I would be pleased to do so.    Sincerely,    Athen Riel K. Posey Pronto, DO

## 2021-12-13 ENCOUNTER — Other Ambulatory Visit: Payer: Self-pay

## 2021-12-13 ENCOUNTER — Ambulatory Visit: Payer: Commercial Managed Care - PPO | Admitting: Neurology

## 2021-12-13 ENCOUNTER — Encounter: Payer: Self-pay | Admitting: Neurology

## 2021-12-13 VITALS — BP 130/77 | HR 73 | Ht 75.0 in | Wt 200.0 lb

## 2021-12-13 DIAGNOSIS — Z8673 Personal history of transient ischemic attack (TIA), and cerebral infarction without residual deficits: Secondary | ICD-10-CM

## 2021-12-13 DIAGNOSIS — I779 Disorder of arteries and arterioles, unspecified: Secondary | ICD-10-CM

## 2021-12-13 NOTE — Patient Instructions (Signed)
Return to clinic in September  ?

## 2021-12-22 ENCOUNTER — Other Ambulatory Visit: Payer: Self-pay | Admitting: Internal Medicine

## 2022-01-03 ENCOUNTER — Encounter: Payer: Self-pay | Admitting: Podiatry

## 2022-01-03 ENCOUNTER — Ambulatory Visit: Payer: Commercial Managed Care - PPO | Admitting: Podiatry

## 2022-01-03 ENCOUNTER — Other Ambulatory Visit: Payer: Self-pay

## 2022-01-03 DIAGNOSIS — M2042 Other hammer toe(s) (acquired), left foot: Secondary | ICD-10-CM | POA: Diagnosis not present

## 2022-01-03 DIAGNOSIS — M79674 Pain in right toe(s): Secondary | ICD-10-CM

## 2022-01-03 DIAGNOSIS — B351 Tinea unguium: Secondary | ICD-10-CM | POA: Diagnosis not present

## 2022-01-03 DIAGNOSIS — M79675 Pain in left toe(s): Secondary | ICD-10-CM | POA: Diagnosis not present

## 2022-01-03 NOTE — Progress Notes (Signed)
Subjective:  ? ?Patient ID: Steve [name], male   DOB: 71 y.o.   MRN: [id number]  ? ?HPI ?Patient presents stating he has had thickened nails on both feet which become irritated and he has tried to take care of them himself is concerned about function fungus and the possibility for discomfort.  Patient does not smoke likes to be active ? ? ?Review of Systems  ?All other systems reviewed and are negative. ? ? ?   ?Objective:  ?Physical Exam ?Vitals and nursing note reviewed.  ?Constitutional:   ?   Appearance: He is well-developed.  ?Pulmonary:  ?   Effort: Pulmonary effort is normal.  ?Musculoskeletal:     ?   General: Normal range of motion.  ?Skin: ?   General: Skin is warm.  ?Neurological:  ?   Mental Status: He is alert.  ?  ?Neurovascular status was found to be intact muscle strength was found to be adequate range of motion adequate.  Patient is noted to have thick yellow brittle nailbeds that are present on both feet with approximate 3 nails on both feet being involved and does have dystrophic changes along with digital deformities digit 3 for the left foot with thickened nails present they are ? ?   ?Assessment:  ?Combination of mycotic nail infection along with trauma secondary to structural position of the toes and activity levels the patient.  Noted to have thickened nails hallux third fourth bilateral with discomfort ? ?   ?Plan:  ?H&P reviewed condition debrided all nails with no iatrogenic bleeding and advised this can be done as needed and patient will be seen back as needed but also is encouraged to do pedicures ?   ? ? ?

## 2022-01-22 ENCOUNTER — Encounter: Payer: Self-pay | Admitting: Internal Medicine

## 2022-01-22 NOTE — Progress Notes (Signed)
? ? ?Subjective:  ? ? Patient ID: Steve [name], male    DOB: 01-28-1951, 71 y.o.   MRN: [id number] ? ?This visit occurred during the SARS-CoV-2 public health emergency.  Safety protocols were in place, including screening questions prior to the visit, additional usage of staff PPE, and extensive cleaning of exam room while observing appropriate contact time as indicated for disinfecting solutions. ? ? ? ?HPI ?Steve [name] is here for  ?Chief Complaint  ?Patient presents with  ? Dizziness  ? ? ?April 2015 - RUE weakness, paresthesia, resolved after 6 hrs.  MRI confirmed stroke.  Echo showed PFO - closed 03/2018. ? ?October 2022 admitted for word finding difficulty. MRI - scattered left hemisphere embolic stroke.  TEE stable w/o shunt.  Ziopatch w/o afib.  He took 3 weeks of ASA 81 mg + plavix and then ASA only.  He went to [facility] clinic - repeat MRA neck w/ plaque protocol - intraplaque hemorrhage w/in distal left common carotid extending to the proximal ICA, naorrowing lumen <40%.  Advised to increase ASA 325 mg and continue crestor.  ? ?Was planning on having repeat MRA plaque protocol at [facility] clinic in may/June.   ? ?Sunday night he got up to go to the restroom and he was dizzy - his eyes were spinning.  His eyes would not focus - they did in a few minutes.  If he laid back down it would happen again.  He only had dizziness - no other symptoms.  He called EMS.  BP was 180/90 -> 160/85.   ? ?? Vertigo - he looked up epley manuver and did it on himself. Yesterday it was better, but would still get diziy with bending over.  Today is better, but balance still off.  He has had vertigo one other time years ago.   ? ? ?Medications and allergies reviewed with patient and updated if appropriate. ? ?Current Outpatient Medications on File Prior to Visit  ?Medication Sig Dispense Refill  ? aspirin 325 MG tablet Take 325 mg by mouth daily. 1 Tablet Daily in The Morning    ? botulinum toxin Type A (BOTOX) 100 units SOLR injection  Inject 100 Units into the muscle every 3 (three) months.    ? CARAC 0.5 % cream Apply topically.    ? cholecalciferol (VITAMIN D3) 25 MCG (1000 UNIT) tablet Take 1,000 Units by mouth daily.    ? fluticasone (FLONASE) 50 MCG/ACT nasal spray ONE SPRAY IN EACH NOSTRIL TWICE DAILY ASNEEDED -USE CROSSOVER TECHNIQUE DISCUSSED (Patient taking differently: Place 1 spray into both nostrils 2 (two) times daily as needed for allergies or rhinitis.) 16 g 0  ? Multiple Vitamins-Minerals (PRESERVISION AREDS 2) CAPS Take 1 capsule by mouth 2 (two) times a day.    ? pantoprazole (PROTONIX) 40 MG tablet TAKE ONE TABLET DAILY 30 tablet 2  ? Polyethyl Glycol-Propyl Glycol (SYSTANE) 0.4-0.3 % GEL ophthalmic gel Place 1 application into both eyes at bedtime.    ? rosuvastatin (CRESTOR) 20 MG tablet Take 1 tablet (20 mg total) by mouth daily. 90 tablet 3  ? vardenafil (LEVITRA) 20 MG tablet Take 20 mg by mouth daily as needed for erectile dysfunction.    ? vitamin B-12 (CYANOCOBALAMIN) 1000 MCG tablet Take 1,000 mcg by mouth daily.    ? vitamin C (ASCORBIC ACID) 500 MG tablet Take 500 mg by mouth daily.    ? ?Current Facility-Administered Medications on File Prior to Visit  ?Medication Dose Route Frequency Provider Last Rate Last  Admin  ? lidocaine-EPINEPHrine (XYLOCAINE W/EPI) 2 %-1:100000 (with pres) injection 10 mL  10 mL Intradermal Once Croitoru, Mihai, MD      ? ? ?Review of Systems  ?Constitutional:  Negative for fever.  ?HENT:  Negative for congestion and trouble swallowing.   ?Eyes:  Negative for visual disturbance.  ?Respiratory:  Negative for cough, shortness of breath and wheezing.   ?Cardiovascular:  Negative for chest pain and palpitations.  ?Neurological:  Negative for speech difficulty, weakness, numbness and headaches.  ? ?   ?Objective:  ? ?Vitals:  ? 01/23/22 1413  ?BP: 130/70  ?Pulse: 80  ?Temp: 98 ?F (36.7 ?C)  ?SpO2: 98%  ? ?BP Readings from Last 3 Encounters:  ?01/23/22 130/70  ?12/13/21 130/77  ?09/28/21 132/84   ? ?Wt Readings from Last 3 Encounters:  ?01/23/22 200 lb (90.7 kg)  ?12/13/21 200 lb (90.7 kg)  ?09/28/21 204 lb (92.5 kg)  ? ?Body mass index is 25 kg/m?. ? ?  ?Physical Exam ?Constitutional:   ?   General: He is not in acute distress. ?   Appearance: Normal appearance. He is not ill-appearing.  ?HENT:  ?   Head: Normocephalic.  ?   Right Ear: Tympanic membrane, ear canal and external ear normal. There is no impacted cerumen.  ?   Left Ear: Tympanic membrane, ear canal and external ear normal. There is no impacted cerumen.  ?   Mouth/Throat:  ?   Mouth: Mucous membranes are moist.  ?   Pharynx: No oropharyngeal exudate or posterior oropharyngeal erythema.  ?Eyes:  ?   Extraocular Movements: Extraocular movements intact.  ?   Conjunctiva/sclera: Conjunctivae normal.  ?Neck:  ?   Vascular: No carotid bruit.  ?Cardiovascular:  ?   Rate and Rhythm: Normal rate and regular rhythm.  ?Pulmonary:  ?   Effort: Pulmonary effort is normal. No respiratory distress.  ?   Breath sounds: Normal breath sounds. No wheezing or rales.  ?Musculoskeletal:  ?   Cervical back: Neck supple. No tenderness.  ?   Right lower leg: No edema.  ?   Left lower leg: No edema.  ?Lymphadenopathy:  ?   Cervical: No cervical adenopathy.  ?Skin: ?   General: Skin is warm and dry.  ?   Findings: No rash.  ?Neurological:  ?   Mental Status: He is alert. Mental status is at baseline.  ?   Cranial Nerves: No cranial nerve deficit (except for chronic weakness in right face - botox injections).  ?   Sensory: No sensory deficit.  ?   Motor: No weakness.  ?Psychiatric:     ?   Mood and Affect: Mood normal.  ? ?   ? ? ? ? ? ?Assessment & Plan:  ? ? ?See Problem List for Assessment and Plan of chronic medical problems.  ? ? ? ? ?

## 2022-01-23 ENCOUNTER — Ambulatory Visit: Payer: Commercial Managed Care - PPO | Admitting: Internal Medicine

## 2022-01-23 VITALS — BP 130/70 | HR 80 | Temp 98.0°F | Ht 75.0 in | Wt 200.0 lb

## 2022-01-23 DIAGNOSIS — R42 Dizziness and giddiness: Secondary | ICD-10-CM

## 2022-01-23 NOTE — Assessment & Plan Note (Signed)
Acute ?Started two nights ago - has gotten a little better ?Likely vertigo - possible BPPV, but given h/o of strokes in the past and unstable plaque in left common carotid artery will get MRI of brain stat - if positive will discuss with neuro RE change in a/c ?

## 2022-01-23 NOTE — Patient Instructions (Addendum)
? ? ? ?  [place] -- [phone] ? ? ?An MRI of your brain was ordered STAT. ? ? ?Medications changes include :   none ? ? ?

## 2022-01-25 ENCOUNTER — Telehealth: Payer: Self-pay | Admitting: Internal Medicine

## 2022-01-25 ENCOUNTER — Encounter: Payer: Self-pay | Admitting: Internal Medicine

## 2022-01-25 ENCOUNTER — Ambulatory Visit
Admission: RE | Admit: 2022-01-25 | Discharge: 2022-01-25 | Disposition: A | Discharge status: Home / Self Care | Payer: Commercial Managed Care - PPO | Source: Ambulatory Visit | Attending: Internal Medicine | Admitting: Internal Medicine

## 2022-01-25 DIAGNOSIS — R42 Dizziness and giddiness: Secondary | ICD-10-CM

## 2022-01-25 MED ORDER — MECLIZINE HCL 25 MG PO TABS: 25.0000 mg | ORAL_TABLET | Freq: Three times a day (TID) | ORAL | 2 refills | Status: DC | PRN

## 2022-01-25 NOTE — Telephone Encounter (Signed)
Message left for patient today and script sent in. ? ?Asked him to call back if he was interested in pursing PT as an option. ?

## 2022-01-25 NOTE — Telephone Encounter (Signed)
Meclizine is pending-not sure which pharmacy to send to.  Let him know this is similar to Benadryl and can make him a little drowsy or tired.  This should help with the dizziness. ? ?We can also refer him for physical therapy.  He can try doing the Epley maneuver. ?

## 2022-01-25 NOTE — Telephone Encounter (Signed)
Connected to [facility] 4.17.2023. ? ?Caller states he is having dizziness. Paramedics came to his house yesterday and cleared him of a stroke. If he moves quickly or leans over he gets dizzy.  ? ?Advised to see HCP with 4 hours.  ?

## 2022-01-28 ENCOUNTER — Encounter: Payer: Self-pay | Admitting: Internal Medicine

## 2022-03-28 ENCOUNTER — Other Ambulatory Visit: Payer: Self-pay | Admitting: Internal Medicine

## 2022-05-30 ENCOUNTER — Encounter: Payer: Self-pay | Admitting: Internal Medicine

## 2022-06-08 ENCOUNTER — Encounter: Payer: Self-pay | Admitting: Internal Medicine

## 2022-06-08 ENCOUNTER — Ambulatory Visit: Payer: Commercial Managed Care - PPO | Admitting: Neurology

## 2022-06-27 ENCOUNTER — Other Ambulatory Visit: Payer: Self-pay | Admitting: Internal Medicine

## 2022-07-04 ENCOUNTER — Ambulatory Visit: Payer: 59 | Admitting: Neurology

## 2022-07-06 ENCOUNTER — Ambulatory Visit: Payer: 59 | Admitting: Neurology

## 2022-07-31 ENCOUNTER — Encounter: Payer: Commercial Managed Care - PPO | Admitting: Internal Medicine

## 2022-08-02 ENCOUNTER — Encounter: Payer: 59 | Admitting: Internal Medicine

## 2022-08-06 ENCOUNTER — Other Ambulatory Visit: Payer: Self-pay | Admitting: Internal Medicine

## 2022-08-08 ENCOUNTER — Encounter: Payer: Commercial Managed Care - PPO | Admitting: Internal Medicine

## 2022-08-10 ENCOUNTER — Ambulatory Visit (AMBULATORY_SURGERY_CENTER): Payer: Self-pay

## 2022-08-10 VITALS — Ht 75.0 in | Wt 200.0 lb

## 2022-08-10 DIAGNOSIS — Z86010 Personal history of colonic polyps: Secondary | ICD-10-CM

## 2022-08-10 MED ORDER — NA SULFATE-K SULFATE-MG SULF 17.5-3.13-1.6 GM/177ML PO SOLN: 1.0000 | Freq: Once | ORAL | 0 refills | Status: AC

## 2022-08-10 NOTE — Progress Notes (Signed)

## 2022-08-20 ENCOUNTER — Encounter: Payer: Self-pay | Admitting: Internal Medicine

## 2022-08-20 NOTE — Patient Instructions (Addendum)
Blood work was ordered.   The lab is on the first floor.    Medications changes include :   none    Return in about 1 year (around 08/22/2023) for Physical Exam.   Health Maintenance, Male Adopting a healthy lifestyle and getting preventive care are important in promoting health and wellness. Ask your health care provider about: The right schedule for you to have regular tests and exams. Things you can do on your own to prevent diseases and keep yourself healthy. What should I know about diet, weight, and exercise? Eat a healthy diet  Eat a diet that includes plenty of vegetables, fruits, low-fat dairy products, and lean protein. Do not eat a lot of foods that are high in solid fats, added sugars, or sodium. Maintain a healthy weight Body mass index (BMI) is a measurement that can be used to identify possible weight problems. It estimates body fat based on height and weight. Your health care provider can help determine your BMI and help you achieve or maintain a healthy weight. Get regular exercise Get regular exercise. This is one of the most important things you can do for your health. Most adults should: Exercise for at least 150 minutes each week. The exercise should increase your heart rate and make you sweat (moderate-intensity exercise). Do strengthening exercises at least twice a week. This is in addition to the moderate-intensity exercise. Spend less time sitting. Even light physical activity can be beneficial. Watch cholesterol and blood lipids Have your blood tested for lipids and cholesterol at 72 years of age, then have this test every 5 years. You may need to have your cholesterol levels checked more often if: Your lipid or cholesterol levels are high. You are older than 72 years of age. You are at high risk for heart disease. What should I know about cancer screening? Many types of cancers can be detected early and may often be prevented. Depending on your  health history and family history, you may need to have cancer screening at various ages. This may include screening for: Colorectal cancer. Prostate cancer. Skin cancer. Lung cancer. What should I know about heart disease, diabetes, and high blood pressure? Blood pressure and heart disease High blood pressure causes heart disease and increases the risk of stroke. This is more likely to develop in people who have high blood pressure readings or are overweight. Talk with your health care provider about your target blood pressure readings. Have your blood pressure checked: Every 3-5 years if you are 29-67 years of age. Every year if you are 65 years old or older. If you are between the ages of 80 and 2 and are a current or former smoker, ask your health care provider if you should have a one-time screening for abdominal aortic aneurysm (AAA). Diabetes Have regular diabetes screenings. This checks your fasting blood sugar level. Have the screening done: Once every three years after age 66 if you are at a normal weight and have a low risk for diabetes. More often and at a younger age if you are overweight or have a high risk for diabetes. What should I know about preventing infection? Hepatitis B If you have a higher risk for hepatitis B, you should be screened for this virus. Talk with your health care provider to find out if you are at risk for hepatitis B infection. Hepatitis C Blood testing is recommended for: Everyone born from [birth year] through 1966. Anyone with known risk factors  for hepatitis C. Sexually transmitted infections (STIs) You should be screened each year for STIs, including gonorrhea and chlamydia, if: You are sexually active and are younger than 72 years of age. You are older than 72 years of age and your health care provider tells you that you are at risk for this type of infection. Your sexual activity has changed since you were last screened, and you are at increased risk  for chlamydia or gonorrhea. Ask your health care provider if you are at risk. Ask your health care provider about whether you are at high risk for HIV. Your health care provider may recommend a prescription medicine to help prevent HIV infection. If you choose to take medicine to prevent HIV, you should first get tested for HIV. You should then be tested every 3 months for as long as you are taking the medicine. Follow these instructions at home: Alcohol use Do not drink alcohol if your health care provider tells you not to drink. If you drink alcohol: Limit how much you have to 0-2 drinks a day. Know how much alcohol is in your drink. In the [place] one drink equals one 12 oz bottle of beer (355 mL), one 5 oz glass of wine (148 mL), or one 1 oz glass of hard liquor (44 mL). Lifestyle Do not use any products that contain nicotine or tobacco. These products include cigarettes, chewing tobacco, and vaping devices, such as e-cigarettes. If you need help quitting, ask your health care provider. Do not use street drugs. Do not share needles. Ask your health care provider for help if you need support or information about quitting drugs. General instructions Schedule regular health, dental, and eye exams. Stay current with your vaccines. Tell your health care provider if: You often feel depressed. You have ever been abused or do not feel safe at home. Summary Adopting a healthy lifestyle and getting preventive care are important in promoting health and wellness. Follow your health care provider's instructions about healthy diet, exercising, and getting tested or screened for diseases. Follow your health care provider's instructions on monitoring your cholesterol and blood pressure. This information is not intended to replace advice given to you by your health care provider. Make sure you discuss any questions you have with your health care provider. Document Revised: 02/13/2021 Document Reviewed:  02/13/2021 Elsevier Patient Education  [employer].

## 2022-08-20 NOTE — Progress Notes (Unsigned)
Subjective:    Patient ID: Steve [name], male    DOB: 1951/04/27, 71 y.o.   MRN: [id number]     HPI Zarek is here for a physical exam.   2 weeks ago - hip pain and anterior right leg pain - did PT, massage - no change.  Saw Dr Percell Miller - had steroid injections and a pack of prednisone - reduced pain a little.  To have MRI.    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  Current Outpatient Medications on File Prior to Visit  Medication Sig Dispense Refill   aspirin 325 MG tablet Take 325 mg by mouth daily. 1 Tablet Daily in The Morning     botulinum toxin Type A (BOTOX) 100 units SOLR injection Inject 100 Units into the muscle every 3 (three) months.     cholecalciferol (VITAMIN D3) 25 MCG (1000 UNIT) tablet Take 1,000 Units by mouth daily.     fluticasone (FLONASE) 50 MCG/ACT nasal spray ONE SPRAY IN EACH NOSTRIL TWICE DAILY ASNEEDED -USE CROSSOVER TECHNIQUE DISCUSSED (Patient taking differently: Place 1 spray into both nostrils 2 (two) times daily as needed for allergies or rhinitis.) 16 g 0   pantoprazole (PROTONIX) 40 MG tablet TAKE ONE TABLET BY MOUTH EVERY DAY 30 tablet 2   Polyethyl Glycol-Propyl Glycol (SYSTANE) 0.4-0.3 % GEL ophthalmic gel Place 1 application into both eyes at bedtime.     rosuvastatin (CRESTOR) 20 MG tablet TAKE ONE TABLET DAILY 90 tablet 3   vardenafil (LEVITRA) 20 MG tablet Take 20 mg by mouth daily as needed for erectile dysfunction.     vitamin B-12 (CYANOCOBALAMIN) 1000 MCG tablet Take 1,000 mcg by mouth daily.     vitamin C (ASCORBIC ACID) 500 MG tablet Take 500 mg by mouth daily.     Current Facility-Administered Medications on File Prior to Visit  Medication Dose Route Frequency Provider Last Rate Last Admin   lidocaine-EPINEPHrine (XYLOCAINE W/EPI) 2 %-1:100000 (with pres) injection 10 mL  10 mL  Intradermal Once Croitoru, Mihai, MD        Review of Systems  Constitutional:  Negative for chills and fever.  Eyes:  Negative for visual disturbance.  Respiratory:  Negative for cough, shortness of breath and wheezing.   Cardiovascular:  Negative for chest pain, palpitations and leg swelling.  Gastrointestinal:  Negative for abdominal pain, blood in stool, constipation, diarrhea and nausea.       Gerd controlled  Genitourinary:  Positive for frequency. Negative for difficulty urinating, dysuria and hematuria.  Musculoskeletal:  Positive for back pain. Negative for arthralgias.       Right leg pain  Skin:  Negative for rash.  Neurological:  Negative for light-headedness and headaches.  Psychiatric/Behavioral:  Negative for dysphoric mood. The patient is not nervous/anxious.        Objective:   Vitals:   08/21/22 1525  BP: 132/72  Pulse: (!) 58  Temp: 98 F (36.7 C)  SpO2: 98%   Filed Weights   08/21/22 1525  Weight: 197 lb 12.8 oz (89.7 kg)   Body mass index is 24.72 kg/m.  BP Readings from Last 3 Encounters:  08/21/22 132/72  01/23/22 130/70  12/13/21 130/77    Wt Readings from Last 3 Encounters:  08/21/22 197 lb 12.8 oz (89.7 kg)  08/10/22 200 lb (90.7 kg)  01/23/22 200 lb (90.7 kg)      Physical Exam Constitutional: He appears well-developed and well-nourished. No distress.  HENT:  Head: Normocephalic and atraumatic.  Right Ear: External ear normal.  Left Ear: External ear normal.  Mouth/Throat: Oropharynx is clear and moist.  Eyes: Conjunctivae and EOM are normal.  Neck: Neck supple. No tracheal deviation present. No thyromegaly present.  No carotid bruit  Cardiovascular: Normal rate, regular rhythm, normal heart sounds and intact distal pulses.   No murmur heard. Pulmonary/Chest: Effort normal and breath sounds normal. No respiratory distress. He has no wheezes. He has no rales.  Abdominal: Soft.  Ventral hernia-reducible.  He exhibits no  distension. There is no tenderness.  Genitourinary: deferred  Musculoskeletal: He exhibits no edema.  Lymphadenopathy:   He has no cervical adenopathy.  Skin: Skin is warm and dry. He is not diaphoretic.  Psychiatric: He has a normal mood and affect. His behavior is normal.         Assessment & Plan:   Physical exam: Screening blood work  ordered Exercise    yoga,pilates, golf Weight  good Substance abuse   none   Reviewed recommended immunizations.   Health Maintenance  Topic Date Due   COVID-19 Vaccine (4 - Pfizer risk series) 04/20/2021   INFLUENZA VACCINE  05/08/2022   COLONOSCOPY (Pts 45-73yr Insurance coverage will need to be confirmed)  07/02/2022   TETANUS/TDAP  03/09/2023   Pneumonia Vaccine 72 Years old  Completed   Hepatitis C Screening  Completed   Zoster Vaccines- Shingrix  Completed   HPV VACCINES  Aged Out     See Problem List for Assessment and Plan of chronic medical problems.

## 2022-08-21 ENCOUNTER — Ambulatory Visit (INDEPENDENT_AMBULATORY_CARE_PROVIDER_SITE_OTHER): Payer: Commercial Managed Care - PPO | Admitting: Internal Medicine

## 2022-08-21 VITALS — BP 132/72 | HR 58 | Temp 98.0°F | Ht 75.0 in | Wt 197.8 lb

## 2022-08-21 DIAGNOSIS — R7303 Prediabetes: Secondary | ICD-10-CM | POA: Diagnosis not present

## 2022-08-21 DIAGNOSIS — E782 Mixed hyperlipidemia: Secondary | ICD-10-CM

## 2022-08-21 DIAGNOSIS — E538 Deficiency of other specified B group vitamins: Secondary | ICD-10-CM

## 2022-08-21 DIAGNOSIS — K219 Gastro-esophageal reflux disease without esophagitis: Secondary | ICD-10-CM

## 2022-08-21 DIAGNOSIS — Z23 Encounter for immunization: Secondary | ICD-10-CM | POA: Diagnosis not present

## 2022-08-21 DIAGNOSIS — Z0000 Encounter for general adult medical examination without abnormal findings: Secondary | ICD-10-CM

## 2022-08-21 DIAGNOSIS — Z8673 Personal history of transient ischemic attack (TIA), and cerebral infarction without residual deficits: Secondary | ICD-10-CM

## 2022-08-21 DIAGNOSIS — E559 Vitamin D deficiency, unspecified: Secondary | ICD-10-CM

## 2022-08-21 NOTE — Assessment & Plan Note (Signed)
Chronic Check lipid panel  Continue crestor 20 mg  Regular exercise and healthy diet encouraged

## 2022-08-21 NOTE — Assessment & Plan Note (Signed)
Chronic Check a1c Low sugar / carb diet Stressed regular exercise  

## 2022-08-21 NOTE — Assessment & Plan Note (Signed)
Chronic Check B12 level Continue B12 supplementation

## 2022-08-21 NOTE — Assessment & Plan Note (Signed)
Chronic ?Check vitamin d level ?

## 2022-08-21 NOTE — Assessment & Plan Note (Signed)
Chronic GERD controlled Continue pantoprazole 40 mg daily given history of esophageal stricture

## 2022-09-05 ENCOUNTER — Encounter: Payer: Self-pay | Admitting: Internal Medicine

## 2022-09-06 ENCOUNTER — Other Ambulatory Visit (INDEPENDENT_AMBULATORY_CARE_PROVIDER_SITE_OTHER): Payer: Commercial Managed Care - PPO

## 2022-09-06 DIAGNOSIS — E538 Deficiency of other specified B group vitamins: Secondary | ICD-10-CM

## 2022-09-06 DIAGNOSIS — E782 Mixed hyperlipidemia: Secondary | ICD-10-CM

## 2022-09-06 DIAGNOSIS — E559 Vitamin D deficiency, unspecified: Secondary | ICD-10-CM

## 2022-09-06 DIAGNOSIS — R7303 Prediabetes: Secondary | ICD-10-CM

## 2022-09-06 DIAGNOSIS — Z0000 Encounter for general adult medical examination without abnormal findings: Secondary | ICD-10-CM

## 2022-09-06 LAB — LIPID PANEL
Cholesterol: 136 mg/dL (ref 0–200)
HDL: 55.7 mg/dL (ref >39.00)
LDL Cholesterol: 65 mg/dL (ref 0–99)
NonHDL: 79.8
Total CHOL/HDL Ratio: 2
Triglycerides: 74 mg/dL (ref 0.0–149.0)
VLDL: 14.8 mg/dL (ref 0.0–40.0)

## 2022-09-06 LAB — COMPREHENSIVE METABOLIC PANEL
ALT: 36 U/L (ref 0–53)
AST: 25 U/L (ref 0–37)
Albumin: 4.6 g/dL (ref 3.5–5.2)
Alkaline Phosphatase: 67 U/L (ref 39–117)
BUN: 21 mg/dL (ref 6–23)
CO2: 30 mEq/L (ref 19–32)
Calcium: 9.4 mg/dL (ref 8.4–10.5)
Chloride: 99 mEq/L (ref 96–112)
Creatinine, Ser: 1.01 mg/dL (ref 0.40–1.50)
GFR: 74.22 mL/min (ref >60.00)
Glucose, Bld: 107 mg/dL — ABNORMAL HIGH (ref 70–99)
Potassium: 4.3 mEq/L (ref 3.5–5.1)
Sodium: 136 mEq/L (ref 135–145)
Total Bilirubin: 0.5 mg/dL (ref 0.2–1.2)
Total Protein: 7.4 g/dL (ref 6.0–8.3)

## 2022-09-06 LAB — CBC WITH DIFFERENTIAL/PLATELET
Basophils Absolute: 0 10*3/uL (ref 0.0–0.1)
Basophils Relative: 0.5 % (ref 0.0–3.0)
Eosinophils Absolute: 0.1 10*3/uL (ref 0.0–0.7)
Eosinophils Relative: 1.8 % (ref 0.0–5.0)
HCT: 46.1 % (ref 39.0–52.0)
Hemoglobin: 15.9 g/dL (ref 13.0–17.0)
Lymphocytes Relative: 24.5 % (ref 12.0–46.0)
Lymphs Abs: 1.6 10*3/uL (ref 0.7–4.0)
MCHC: 34.6 g/dL (ref 30.0–36.0)
MCV: 93 fl (ref 78.0–100.0)
Monocytes Absolute: 0.6 10*3/uL (ref 0.1–1.0)
Monocytes Relative: 9.4 % (ref 3.0–12.0)
Neutro Abs: 4.2 10*3/uL (ref 1.4–7.7)
Neutrophils Relative %: 63.8 % (ref 43.0–77.0)
Platelets: 172 10*3/uL (ref 150.0–400.0)
RBC: 4.95 Mil/uL (ref 4.22–5.81)
RDW: 13.4 % (ref 11.5–15.5)
WBC: 6.6 10*3/uL (ref 4.0–10.5)

## 2022-09-06 LAB — VITAMIN D 25 HYDROXY (VIT D DEFICIENCY, FRACTURES): VITD: 54.97 ng/mL (ref 30.00–100.00)

## 2022-09-06 LAB — VITAMIN B12: Vitamin B-12: 1323 pg/mL — ABNORMAL HIGH (ref 211–911)

## 2022-09-06 LAB — HEMOGLOBIN A1C: Hgb A1c MFr Bld: 6.1 % (ref 4.6–6.5)

## 2022-09-07 ENCOUNTER — Ambulatory Visit (AMBULATORY_SURGERY_CENTER): Payer: Commercial Managed Care - PPO | Admitting: Internal Medicine

## 2022-09-07 ENCOUNTER — Encounter: Payer: Self-pay | Admitting: Internal Medicine

## 2022-09-07 VITALS — BP 112/72 | HR 77 | Temp 97.7°F | Resp 11 | Ht 75.0 in | Wt 200.0 lb

## 2022-09-07 DIAGNOSIS — Z86010 Personal history of colonic polyps: Secondary | ICD-10-CM | POA: Diagnosis not present

## 2022-09-07 DIAGNOSIS — Z09 Encounter for follow-up examination after completed treatment for conditions other than malignant neoplasm: Secondary | ICD-10-CM

## 2022-09-07 MED ORDER — SODIUM CHLORIDE 0.9 % IV SOLN: 500.0000 mL | Freq: Once | INTRAVENOUS | Status: DC

## 2022-09-07 NOTE — Progress Notes (Signed)
Report to PACU, RN, vss, BBS= Clear.  

## 2022-09-07 NOTE — Patient Instructions (Signed)
Resume previous diet and medications. Repeat Colonoscopy in 5 years for surveillance. Handouts provided on Hemorrhoids and Diverticulosis  YOU HAD AN ENDOSCOPIC PROCEDURE TODAY AT [facility]:   Refer to the procedure report that was given to you for any specific questions about what was found during the examination.  If the procedure report does not answer your questions, please call your gastroenterologist to clarify.  If you requested that your care partner not be given the details of your procedure findings, then the procedure report has been included in a sealed envelope for you to review at your convenience later.  YOU SHOULD EXPECT: Some feelings of bloating in the abdomen. Passage of more gas than usual.  Walking can help get rid of the air that was put into your GI tract during the procedure and reduce the bloating. If you had a lower endoscopy (such as a colonoscopy or flexible sigmoidoscopy) you may notice spotting of blood in your stool or on the toilet paper. If you underwent a bowel prep for your procedure, you may not have a normal bowel movement for a few days.  Please Note:  You might notice some irritation and congestion in your nose or some drainage.  This is from the oxygen used during your procedure.  There is no need for concern and it should clear up in a day or so.  SYMPTOMS TO REPORT IMMEDIATELY:  Following lower endoscopy (colonoscopy or flexible sigmoidoscopy):  Excessive amounts of blood in the stool  Significant tenderness or worsening of abdominal pains  Swelling of the abdomen that is new, acute  Fever of 100F or higher  For urgent or emergent issues, a gastroenterologist can be reached at any hour by calling [phone]. Do not use MyChart messaging for urgent concerns.    DIET:  We do recommend a small meal at first, but then you may proceed to your regular diet.  Drink plenty of fluids but you should avoid alcoholic beverages for 24  hours.  ACTIVITY:  You should plan to take it easy for the rest of today and you should NOT DRIVE or use heavy machinery until tomorrow (because of the sedation medicines used during the test).    FOLLOW UP: Our staff will call the number listed on your records the next business day following your procedure.  We will call around 7:15- 8:00 am to check on you and address any questions or concerns that you may have regarding the information given to you following your procedure. If we do not reach you, we will leave a message.     If any biopsies were taken you will be contacted by phone or by letter within the next 1-3 weeks.  Please call us at [phone] if you have not heard about the biopsies in 3 weeks.    SIGNATURES/CONFIDENTIALITY: You and/or your care partner have signed paperwork which will be entered into your electronic medical record.  These signatures attest to the fact that that the information above on your After Visit Summary has been reviewed and is understood.  Full responsibility of the confidentiality of this discharge information lies with you and/or your care-partner.

## 2022-09-07 NOTE — Op Note (Signed)
[facility] Patient Name: Draken Farrior Procedure Date: 09/07/2022 10:52 AM MRN: [id number] Endoscopist: Docia Chuck. Henrene Pastor , MD, [id number] Age: 71 Referring MD:  Date of Birth: 30-Oct-1950 Gender: Male Account #: [id number] Procedure:                Colonoscopy Indications:              High risk colon cancer surveillance: Personal                            history of multiple adenomas. Previous examinations                            2003, 2008, 2013, 2018 Medicines:                Monitored Anesthesia Care Procedure:                Pre-Anesthesia Assessment:                           - Prior to the procedure, a History and Physical                            was performed, and patient medications and                            allergies were reviewed. The patient's tolerance of                            previous anesthesia was also reviewed. The risks                            and benefits of the procedure and the sedation                            options and risks were discussed with the patient.                            All questions were answered, and informed consent                            was obtained. Prior Anticoagulants: The patient has                            taken no anticoagulant or antiplatelet agents. ASA                            Grade Assessment: III - A patient with severe                            systemic disease. After reviewing the risks and                            benefits, the patient was deemed in satisfactory  condition to undergo the procedure.                           After obtaining informed consent, the colonoscope                            was passed under direct vision. Throughout the                            procedure, the patient's blood pressure, pulse, and                            oxygen saturations were monitored continuously. The                            Olympus CF-HQ190L (Serial# [id number])  Colonoscope was                            introduced through the anus and advanced to the the                            cecum, identified by appendiceal orifice and                            ileocecal valve. The ileocecal valve, appendiceal                            orifice, and rectum were photographed. The quality                            of the bowel preparation was excellent. The                            colonoscopy was performed without difficulty. The                            patient tolerated the procedure well. The bowel                            preparation used was SUPREP via split dose                            instruction. Scope In: 11:09:22 AM Scope Out: 11:22:20 AM Scope Withdrawal Time: 0 hours 9 minutes 12 seconds  Total Procedure Duration: 0 hours 12 minutes 58 seconds  Findings:                 Multiple diverticula were found in the left colon.                           Internal hemorrhoids were found during                            retroflexion. The hemorrhoids were small.  The exam was otherwise without abnormality on                            direct and retroflexion views. Complications:            No immediate complications. Estimated blood loss:                            None. Estimated Blood Loss:     Estimated blood loss: none. Impression:               - Diverticulosis in the left colon.                           - Internal hemorrhoids.                           - The examination was otherwise normal on direct                            and retroflexion views.                           - No specimens collected. Recommendation:           - Repeat colonoscopy in 5 years for surveillance.                           - Patient has a contact number available for                            emergencies. The signs and symptoms of potential                            delayed complications were discussed with the                             patient. Return to normal activities tomorrow.                            Written discharge instructions were provided to the                            patient.                           - Resume previous diet.                           - Continue present medications. Docia Chuck. Henrene Pastor, MD 09/07/2022 11:29:54 AM This report has been signed electronically.

## 2022-09-07 NOTE — Progress Notes (Signed)
Pt's states no medical or surgical changes since previsit or office visit. 

## 2022-09-07 NOTE — Progress Notes (Signed)
HISTORY OF PRESENT ILLNESS:  Steve [name] is a 71 y.o. male with a history of adenomatous colon polyps.  Presents today for surveillance colonoscopy.  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   Sleep apnea    Resolved   Squamous cell skin cancer    Dr.King [place]   Stricture esophagus 2013    dilation X 1   Stroke ([place]) 2016   left semiovale stroke    TIA (transient ischemic attack) 01/2015   Ulcerative esophagitis 2013    Past Surgical History:  Procedure Laterality Date   BUBBLE STUDY  08/11/2021   Procedure: BUBBLE STUDY;  Surgeon: Skeet Latch, MD;  Location: [facility];  Service: Cardiovascular;;   COLONOSCOPY W/ POLYPECTOMY  2013, 2018 last done   tubular adenoma   COLONOSCOPY W/ POLYPECTOMY  2008   hyperplastic   CYSTOSCOPY N/A 09/19/2020   Procedure: CYSTOSCOPY FLEXIBLE;  Surgeon: Franchot Gallo, MD;  Location: [facility];  Service: Urology;  Laterality: N/A;   EP IMPLANTABLE DEVICE N/A 01/03/2016   Procedure: Loop Recorder Insertion;  Surgeon: Sanda Klein, MD;  Location: [facility];  Service: Cardiovascular;  Laterality: N/A;   MOHS SURGERY     x 3   NASAL SINUS SURGERY  15 yrs ago   PATENT FORAMEN OVALE(PFO) CLOSURE N/A 03/27/2018   Procedure: PATENT FORAMEN OVALE (PFO) CLOSURE;  Surgeon: Sherren Mocha, MD;  Location: [facility];   Service: Cardiovascular;  Laterality: N/A;   PROSTATE BIOPSY  2021   RADIOACTIVE SEED IMPLANT N/A 09/19/2020   Procedure: RADIOACTIVE SEED IMPLANT/BRACHYTHERAPY IMPLANT;  Surgeon: Franchot Gallo, MD;  Location: [facility];  Service: Urology;  Laterality: N/A;  90 MINS   REVERSE SHOULDER ARTHROPLASTY Right 03/10/2019   Procedure: REVERSE SHOULDER ARTHROPLASTY;  Surgeon: Marchia Bond, MD;  Location: [facility];  Service: Orthopedics;  Laterality: Right;   ROTATOR CUFF REPAIR Left yrs ago   SPACE OAR INSTILLATION N/A 09/19/2020   Procedure: SPACE OAR INSTILLATION;  Surgeon: Franchot Gallo, MD;  [facility];  Service: Urology;  Laterality: N/A;   TEE WITHOUT CARDIOVERSION N/A 01/06/2018   Procedure: TRANSESOPHAGEAL ECHOCARDIOGRAM (TEE);  Surgeon: Sanda Klein, MD;  Location: [facility];  Service: Cardiovascular;  Laterality: N/A;   TEE WITHOUT CARDIOVERSION N/A 08/11/2021   Procedure: TRANSESOPHAGEAL ECHOCARDIOGRAM (TEE);  Surgeon: Skeet Latch, MD;  Location: [facility];  Service: Cardiovascular;  Laterality: N/A;   torn biceps tendon Left yrs ago   surgical repair   uvulectomy & tonsillectomy  2008   Dr Wilburn Cornelia    Social History [name]  reports that he quit smoking about 40 years ago. His smoking use included cigarettes. He has a 5.00 pack-year smoking history. He has never used smokeless  tobacco. He reports current alcohol use of about 5.0 - 6.0 standard drinks of alcohol per week. He reports that he does not use drugs.  family history includes Breast cancer in his mother; Colon cancer in his mother; Colon polyps in his mother; Diabetes in his daughter; Healthy in his brother; Heart attack in his maternal grandfather; Hyperlipidemia in his son; Prostate cancer in his father; Stroke in his father and paternal uncle; Uterine cancer in his mother.  Allergies  Allergen Reactions   Pollen Extract Itching    Eyes itch and water        PHYSICAL EXAMINATION: Vital signs: BP (!) 150/90   Pulse 90   Temp 97.7 F (36.5 C)   Ht '6\' 3"'$  (1.905 m)   Wt 200 lb (90.7 kg)   SpO2 96%   BMI 25.00 kg/m  General: Well-developed, well-nourished, no acute distress HEENT: Sclerae are anicteric, conjunctiva pink. Oral mucosa intact Lungs: Clear Heart: Regular Abdomen: soft, nontender, nondistended, no obvious ascites, no peritoneal signs, normal bowel sounds. No organomegaly. Extremities: No edema Psychiatric: alert and oriented x3. Cooperative     ASSESSMENT:  History of adenomatous colon polyps   PLAN:   Surveillance colonoscopy

## 2022-09-10 ENCOUNTER — Telehealth: Payer: Self-pay | Admitting: *Deleted

## 2022-09-10 NOTE — Telephone Encounter (Signed)
  Follow up Call-     09/07/2022   10:13 AM  Call back number  Post procedure Call Back phone  # [phone]  Permission to leave phone message Yes     Patient questions:  Do you have a fever, pain , or abdominal swelling? No. Pain Score  0 *  Have you tolerated food without any problems? No.  Have you been able to return to your normal activities? No.  Do you have any questions about your discharge instructions: Diet   No. Medications  No. Follow up visit  No.  Do you have questions or concerns about your Care? No.  Actions: * If pain score is 4 or above: No action needed, pain <4.

## 2022-09-12 ENCOUNTER — Encounter: Payer: Self-pay | Admitting: Neurology

## 2022-09-12 ENCOUNTER — Other Ambulatory Visit (INDEPENDENT_AMBULATORY_CARE_PROVIDER_SITE_OTHER): Payer: Commercial Managed Care - PPO

## 2022-09-12 ENCOUNTER — Ambulatory Visit: Payer: Commercial Managed Care - PPO | Admitting: Neurology

## 2022-09-12 VITALS — BP 127/73 | HR 85 | Ht 75.0 in | Wt 195.0 lb

## 2022-09-12 DIAGNOSIS — Z8673 Personal history of transient ischemic attack (TIA), and cerebral infarction without residual deficits: Secondary | ICD-10-CM

## 2022-09-12 DIAGNOSIS — R251 Tremor, unspecified: Secondary | ICD-10-CM

## 2022-09-12 DIAGNOSIS — I639 Cerebral infarction, unspecified: Secondary | ICD-10-CM | POA: Diagnosis not present

## 2022-09-12 DIAGNOSIS — I779 Disorder of arteries and arterioles, unspecified: Secondary | ICD-10-CM

## 2022-09-12 LAB — TSH: TSH: 2.27 u[IU]/mL (ref 0.35–5.50)

## 2022-09-12 MED ORDER — PROPRANOLOL HCL 20 MG PO TABS: 20.0000 mg | ORAL_TABLET | Freq: Two times a day (BID) | ORAL | 5 refills | Status: DC

## 2022-09-12 NOTE — Patient Instructions (Signed)
Check labs  Start propranolol '20mg'$  twice daily.  Send a MyChart message in 2 weeks with an update  Return to clinic in 6 months

## 2022-09-12 NOTE — Progress Notes (Signed)
d   Follow-up Visit   Date: 09/12/22    Steve [name]: [id number] DOB: 10/17/1950   Interim History: Steve [name] is a 71 y.o. right-handed Caucasian male with right hemifacial spasm, hyperlipidemia, BPH, GERD, and left semiovale stroke (2016, manifesting with right arm weakness and facial numbness), prostate cancer (2021) returning to the clinic for with left embolic stroke (40/9811).  History of present illness: On April 23rd 2015, he woke up with right arm heaviness/weakness and right sided paresthesias. There was no weakness of the right lower extremity or the face.  Symptoms resolved within about 6 hours.  He has noticed that his signature and hand writing is showing subtle differences.  He stopped taking aspirin several years ago and restarted aspirin '81mg'$  when these symptoms started.  MRI brain showed small left centrum semiovale stroke.  There is no evidence of large vessel occlusion. Echocardiogram is normal except moderate right-to-left shunt without atrial septal aneurysm. He had PFO closure on 03/27/18.  He has seen Dr. Erling Cruz in the late 1990s for hemifacial spasm and gets botox injected.  In 2019, he began having left sided tightness of the thigh . Sometimes, he has difficulty raising the left leg, such as with wearing pants.  There is some associated low back pain.  He has seen [facility] in the past and had MRI lumbar spine which showed degenerative changes at L4-5.  UPDATE 09/13/2021:  He was admitted to [facility] in October with acute onset of word-finding difficulty, which he noticed especially when trying to type emails.  He went to the ER where MRI confirmed scattered left hemisphere embolic stroke.  TEE was stable, no shunt.  Zio patch monitor did not detect atrial fibrillation.  MRA did not show any large vessel stenosis/occlusion.  He had self discontinued aspirin, but was planning on starting it that day.  Since discharge, he has completed 3 weeks of  ASA '81mg'$  + plavix '75mg'$  and now on aspirin '81mg'$  daily.  Statin therapy was also changed to crestor '20mg'$  which has improved his LDL from 104 down to 61.  He has noticed some improvement, but is not back to baseline. He did seek second opinion at [facility] and was seen by Dr. Rogelio Seen who recommended repeat MRA neck with plaque protocol due to concern of possible plaque at the left carotid bifurcation.  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 He was suggested to increase aspirin to '325mg'$  and continue crestor.   UPDATE 09/12/2022:  He is here for follow-up visit.  In April, he had one day of dizziness and unsteadiness.  He had MRI brain which did not show acute changes.  He also had repeat MRA neck plaque protocol at [facility] which showed stable intraplaque hemorrhage.  He was suggested to have repeat scan in 6-7 months.  No interval TIA or stroke.   Over the past several months, he has noticed greater difficulty with writing because of right hand tremor.  Tremor mild but it is bothersome.  No family history of tremors.    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 Because of this pain, he is unable to enjoy outdoor activities, such as golf.   Medications:  Current Outpatient Medications on File Prior  to Visit  Medication Sig Dispense Refill   aspirin 325 MG tablet Take 325 mg by mouth daily. 1 Tablet Daily in The Morning     botulinum toxin Type A (BOTOX) 100 units SOLR injection Inject 100 Units into the muscle every 3 (three) months.     cholecalciferol (VITAMIN D3) 25 MCG (1000 UNIT) tablet Take 1,000 Units by mouth daily.     fluticasone (FLONASE) 50 MCG/ACT nasal spray ONE SPRAY IN EACH NOSTRIL TWICE DAILY ASNEEDED -USE CROSSOVER  TECHNIQUE DISCUSSED (Patient taking differently: Place 1 spray into both nostrils 2 (two) times daily as needed for allergies or rhinitis.) 16 g 0   pantoprazole (PROTONIX) 40 MG tablet TAKE ONE TABLET BY MOUTH EVERY DAY 30 tablet 2   Polyethyl Glycol-Propyl Glycol (SYSTANE) 0.4-0.3 % GEL ophthalmic gel Place 1 application into both eyes at bedtime.     rosuvastatin (CRESTOR) 20 MG tablet TAKE ONE TABLET DAILY 90 tablet 3   SOLIFENACIN SUCCINATE PO Take 10 mg by mouth daily.     vardenafil (LEVITRA) 20 MG tablet Take 20 mg by mouth daily as needed for erectile dysfunction.     vitamin B-12 (CYANOCOBALAMIN) 1000 MCG tablet Take 1,000 mcg by mouth daily.     vitamin C (ASCORBIC ACID) 500 MG tablet Take 500 mg by mouth daily.     Current Facility-Administered Medications on File Prior to Visit  Medication Dose Route Frequency Provider Last Rate Last Admin   lidocaine-EPINEPHrine (XYLOCAINE W/EPI) 2 %-1:100000 (with pres) injection 10 mL  10 mL Intradermal Once Croitoru, Mihai, MD        Allergies:  Allergies  Allergen Reactions   Pollen Extract Itching    Eyes itch and water     Vital Signs:  BP 127/73   Pulse 85   Ht '6\' 3"'$  (1.905 m)   Wt 195 lb (88.5 kg)   SpO2 98%   BMI 24.37 kg/m   Neurological Exam: MENTAL STATUS including orientation to time, place, person, recent and remote memory, attention span and concentration, language, and fund of knowledge is normal.  No dysarthria.   CRANIAL NERVES:   Normal conjugate, extra-ocular eye movements in all directions of gaze. Right facial asymmetry involving the forehead and cheek, asymmetrical smile on the right (due to botox administration for hemifacial spasm).  Mild right ptosis.  Intermittent right hemifacial spasm  MOTOR:  Motor strength is 5/5 in all extremities. Fine tremor of the hands when outstretched and worse with finger to nose testing at endpoint.  No rest tremor.    No pronator drift.  Tone is normal.    SENSORY: Intact  vibration throughout  REFLEX: Reflexes are 2+/4 throughout  COORDINATION/GAIT:  Normal finger-to- nose-finger.  Intact rapid alternating movements bilaterally.  Gait narrow based and stable.  Data: MRI/A brain and neck 03/18/2015: 1. Mild white matter signal changes in the left posterior centrum semiovale with subtle diffusion signal changes suggestive of subacute white matter infarct. No mass effect or hemorrhage. 2. Otherwise normal for age noncontrast MRI appearance of the brain. 3. Left greater than right carotid bifurcation atherosclerosis with no hemodynamically significant stenosis in the neck. 4.  Negative intracranial MRA.  Lab Results  Component Value Date   CHOL 136 09/06/2022   HDL 55.70 09/06/2022   LDLCALC 65 09/06/2022   LDLDIRECT 172.0 07/24/2007   TRIG 74.0 09/06/2022   CHOLHDL 2 09/06/2022   Lab Results  Component Value Date   HGBA1C 6.1 09/06/2022   Echocardiogram 03/14/2015: - Left  ventricle: The cavity size was normal. Systolic function was   normal. The estimated ejection fraction was in the range of 55%  to 60%. Wall motion was normal; there were no regional wall motion abnormalities. Left ventricular diastolic function   parameters were normal. - Atrial septum: There was a moderate right-to-left atrial level shunt, in the baseline state. There was redundancy of the septum,   with borderline criteria for aneurysm.  TEE 01/13/2018: - Left ventricle: The cavity size was normal. Wall thickness was normal. Systolic function was normal. The estimated ejection   fraction was in the range of 55% to 60%. Wall motion was normal; there were no regional wall motion abnormalities.  - Left atrium: No evidence of thrombus in the atrial cavity or appendage. - Right atrium: No evidence of thrombus in the atrial cavity or appendage. - Atrial septum: There was a small secundum atrial septal defect. Doppler showed a small bidirectional atrial level shunt, in the   baseline  state. 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 Acute infarcts in the left frontal and parietal lobes, with the largest area involving the left posteroinferior frontal cortex. Multiple punctate infarcts in multiple vascular territories suggest embolic etiology. 2. No intracranial large vessel occlusion. 3. No hemodynamically significant stenosis in the neck.  MRI brain wo contrast 01/25/2022: 1. No recent insult or specific cause for symptoms. 2. Remote left cerebral infarcts.   IMPRESSION/PLAN: 1.  Left hemisphere embolic stroke (62/6948) manifesting with expressive aphasia.  Etiology - LICA intraplaque hemorrhage as seen on dedicated plaque protocol MRA at the [facility] [place] in the setting of being off antiplatelet therapy. Repeat imaging shows stable plaque. Clinically, he is doing very well with mild word-finding difficulty.  He completed dual antiplatelet therapy and has been on monotherapy with aspirin.  - Continue aspirin '325mg'$  daily  - Continue crestor '20mg'$  daily (LDL 65)  - If there continues to be intraplaque hemorrage that is expanding or he has new neurological symptoms, refer to vascular surgery for left CEA/stenting.    2. History of small left posterior centrum semiovale stroke manifesting with transient R arm weakness and hemisensory loss in April 2016. 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  He is s/p PFO closure in 2019.  No cardiac arrhythmia on loop recorder.  3.  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 Right hemifacial spasm, followed by Dr. Zonia Kief  in [place], [place]  Return to clinic in 6 months   Thank you for allowing me to participate in patient's care.  If I can answer any additional questions, I would be pleased to do so.    Sincerely,    Steve [name] K. Posey Pronto, DO

## 2022-09-27 ENCOUNTER — Other Ambulatory Visit: Payer: Self-pay | Admitting: Internal Medicine

## 2022-12-17 ENCOUNTER — Ambulatory Visit: Payer: Commercial Managed Care - PPO | Attending: Cardiovascular Disease | Admitting: Cardiovascular Disease

## 2022-12-17 ENCOUNTER — Encounter: Payer: Self-pay | Admitting: Cardiovascular Disease

## 2022-12-17 VITALS — BP 143/89 | HR 71 | Ht 75.0 in | Wt 200.0 lb

## 2022-12-17 DIAGNOSIS — E782 Mixed hyperlipidemia: Secondary | ICD-10-CM | POA: Diagnosis not present

## 2022-12-17 DIAGNOSIS — I6522 Occlusion and stenosis of left carotid artery: Secondary | ICD-10-CM | POA: Diagnosis not present

## 2022-12-17 DIAGNOSIS — R7303 Prediabetes: Secondary | ICD-10-CM

## 2022-12-17 DIAGNOSIS — Z8774 Personal history of (corrected) congenital malformations of heart and circulatory system: Secondary | ICD-10-CM

## 2022-12-17 DIAGNOSIS — Z8673 Personal history of transient ischemic attack (TIA), and cerebral infarction without residual deficits: Secondary | ICD-10-CM | POA: Diagnosis not present

## 2022-12-17 NOTE — Patient Instructions (Signed)
Medication Instructions:  No changes *If you need a refill on your cardiac medications before your next appointment, please call your pharmacy*  Follow-Up: At [facility], you and your health needs are our priority.  As part of our continuing mission to provide you with exceptional heart care, we have created designated Provider Care Teams.  These Care Teams include your primary Cardiologist (physician) and Advanced Practice Providers (APPs -  Physician Assistants and Nurse Practitioners) who all work together to provide you with the care you need, when you need it.  We recommend signing up for the patient portal called "MyChart".  Sign up information is provided on this After Visit Summary.  MyChart is used to connect with patients for Virtual Visits (Telemedicine).  Patients are able to view lab/test results, encounter notes, upcoming appointments, etc.  Non-urgent messages can be sent to your provider as well.   To learn more about what you can do with MyChart, go to [email or web address].    Your next appointment:   1 year(s)  Provider:   Sanda Klein, MD     Other Instructions Please keep a log of your BP for one week and send in via MyChart

## 2022-12-17 NOTE — Progress Notes (Signed)
Patient ID: Steve [name], male   DOB: [birth year], 72 y.o.   MRN: [id number]      Cardiology Office Note    Date:  12/17/2022   ID:  Steve [name], DOB [birth year], MRN [id number]  PCP:  Binnie Rail, MD  Cardiologist:   Sanda Klein, MD   Chief Complaint  Patient presents with   Tremors    History of Present Illness:  Steve [name] is a 72 y.o. male who presents in follow-up after closure (Amplatzer 25 mm, Dr. Burt Knack,  in June 2019) of a large PFO with atrial septal aneurysm felt to be the cause of his previous ischemic stroke that occurred in 2016.  Implantable loop recorder monitoring has not shown evidence of atrial fibrillation (ILR extracted at end of service).  He had a new left MCA ischemic event in October 2022 (mildly aphasic, problem resolved).  Extensive evaluation including a visit to the [facility] suggested that his stroke was due to a unstable plaque in his left carotid artery, with a mild degree of stenosis at 40%.  He has developed a fine tremor in his hands which she notices alters his handwriting, but no other neurological complaints.  He took propranolol for about a month, but did not like the way it made him feel so he stopped it.  The tremor is not too bad.  The patient specifically denies any chest pain at rest exertion, dyspnea at rest or with exertion, orthopnea, paroxysmal nocturnal dyspnea, syncope, palpitations, focal neurological deficits, intermittent claudication, lower extremity edema, unexplained weight gain, cough, hemoptysis or wheezing.   He is exercising 5 or 6 days a week.  3 days a week he goes to the gym and rides the elliptical for 30 minutes achieving a peak heart rate of 150 bpm and then lift some weights.  Other days he will walk, go to yoga or Pilates.  Sounds like he is getting well over 3 hours of exercise a week.  Metabolic parameters are generally good HDL 55, LDL 65, A1c borderline elevated at 6.1%.  He has normal renal  function.  His blood pressure is slightly high for him today but this is atypical.  In the past his blood pressure has usually been in the 120s/70s.  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   Sleep apnea    Resolved   Squamous cell skin cancer    Dr.[place]   Stricture esophagus 2013    dilation X 1   Stroke ([place]) 2016   left semiovale stroke    TIA (transient ischemic attack) 01/2015   Ulcerative esophagitis 2013    Past Surgical History:  Procedure Laterality Date   BUBBLE STUDY  08/11/2021   Procedure: BUBBLE STUDY;  Surgeon: Skeet Latch, MD;  Location: [facility];  Service: Cardiovascular;;   COLONOSCOPY W/ POLYPECTOMY  2013, 2018 last done  tubular adenoma   COLONOSCOPY W/ POLYPECTOMY  2008   hyperplastic   CYSTOSCOPY N/A 09/19/2020   Procedure: CYSTOSCOPY FLEXIBLE;  Surgeon: Franchot Gallo, MD;  Location: [facility];  Service: Urology;  Laterality: N/A;   EP IMPLANTABLE DEVICE N/A 01/03/2016   Procedure: Loop Recorder Insertion;  Surgeon: Sanda Klein, MD;  Location: [facility];  Service: Cardiovascular;  Laterality: N/A;   MOHS SURGERY     x 3   NASAL SINUS SURGERY  15 yrs ago   PATENT FORAMEN OVALE(PFO) CLOSURE N/A 03/27/2018   Procedure: PATENT FORAMEN OVALE (PFO) CLOSURE;  Surgeon: Sherren Mocha, MD;  Location: [facility];  Service: Cardiovascular;  Laterality: N/A;   PROSTATE BIOPSY  2021   RADIOACTIVE SEED IMPLANT N/A 09/19/2020   Procedure:  RADIOACTIVE SEED IMPLANT/BRACHYTHERAPY IMPLANT;  Surgeon: Franchot Gallo, MD;  [facility];  Service: Urology;  Laterality: N/A;  90 MINS   REVERSE SHOULDER ARTHROPLASTY Right 03/10/2019   Procedure: REVERSE SHOULDER ARTHROPLASTY;  Surgeon: Marchia Bond, MD;  Location: [facility];  Service: Orthopedics;  Laterality: Right;   ROTATOR CUFF REPAIR Left yrs ago   SPACE OAR INSTILLATION N/A 09/19/2020   Procedure: SPACE OAR INSTILLATION;  Surgeon: Franchot Gallo, MD;  Location: [facility];  Service: Urology;  Laterality: N/A;   TEE WITHOUT CARDIOVERSION N/A 01/06/2018   Procedure: TRANSESOPHAGEAL ECHOCARDIOGRAM (TEE);  Surgeon: Sanda Klein, MD;  Location: [facility];  Service: Cardiovascular;  Laterality: N/A;   TEE WITHOUT CARDIOVERSION N/A 08/11/2021   Procedure: TRANSESOPHAGEAL ECHOCARDIOGRAM (TEE);  Surgeon: Skeet Latch, MD;  Location: [facility];  Service: Cardiovascular;  Laterality: N/A;   torn biceps tendon Left yrs ago   surgical repair   uvulectomy & tonsillectomy  2008   Dr Wilburn Cornelia    Outpatient Medications Prior to Visit  Medication Sig Dispense Refill   aspirin 325 MG tablet Take 325 mg by mouth daily. 1 Tablet Daily in The Morning     botulinum toxin Type A (BOTOX) 100 units SOLR injection Inject 100 Units into the muscle every 3 (three) months.     cholecalciferol (VITAMIN D3) 25 MCG (1000 UNIT) tablet Take 1,000 Units by mouth daily.     fluticasone (FLONASE) 50 MCG/ACT nasal spray ONE SPRAY IN EACH NOSTRIL TWICE DAILY ASNEEDED -USE CROSSOVER TECHNIQUE DISCUSSED (Patient taking differently: Place 1 spray into both nostrils 2 (two) times daily as needed for allergies or rhinitis.) 16 g 0   Multiple Vitamins-Minerals (PRESERVISION AREDS PO) Take 1 capsule by mouth 2 (two) times daily.     pantoprazole (PROTONIX) 40 MG tablet TAKE ONE TABLET BY MOUTH EVERY DAY 30 tablet 2   Polyethyl Glycol-Propyl Glycol (SYSTANE) 0.4-0.3 % GEL  ophthalmic gel Place 1 application into both eyes at bedtime.     rosuvastatin (CRESTOR) 40 MG tablet 40 mg daily.     SOLIFENACIN SUCCINATE PO Take 10 mg by mouth daily.     vardenafil (LEVITRA) 20 MG tablet Take 20 mg by mouth daily as needed for erectile dysfunction.     vitamin B-12 (CYANOCOBALAMIN) 1000 MCG tablet Take 1,000 mcg by mouth daily.     vitamin C (ASCORBIC ACID) 500 MG tablet Take 500 mg by mouth daily.     propranolol (INDERAL) 20 MG tablet Take 1 tablet (20 mg total) by mouth 2 (two) times daily. 60 tablet 5   rosuvastatin (CRESTOR) 20 MG tablet TAKE ONE TABLET DAILY (Patient not taking: Reported on 12/17/2022) 90 tablet 3  Facility-Administered Medications Prior to Visit  Medication Dose Route Frequency Provider Last Rate Last Admin   lidocaine-EPINEPHrine (XYLOCAINE W/EPI) 2 %-1:100000 (with pres) injection 10 mL  10 mL Intradermal Once Trinidad Petron, MD         Allergies:   Pollen extract   Social History   Socioeconomic History   Marital status: Married    Spouse name: Not on file   Number of children: Not on file   Years of education: Not on file   Highest education level: Not on file  Occupational History   Occupation: [employer]IDENT [employer]  Tobacco Use   Smoking status: Former    Packs/day: 0.50    Years: 10.00    Total pack years: 5.00    Types: Cigarettes    Quit date: 88    Years since quitting: 41.2   Smokeless tobacco: Never   Tobacco comments:    smoked 1972-1982, up to 1/2 ppd  Vaping Use   Vaping Use: Never used  Substance and Sexual Activity   Alcohol use: Yes    Alcohol/week: 5.0 - 6.0 standard drinks of alcohol    Types: 5 - 6 Standard drinks or equivalent per week    Comment:  2 -3 drinks per week   Drug use: No   Sexual activity: Not Currently  Other Topics Concern   Not on file  Social History Narrative   GETS REG EXERCISE 4x weekly   NO DIET   Lives with wife in a 2 story home.  Has 2 children.     Works as a  Insurance underwriter.     Education: college   Right Handed   Drinks Caffeine    Lives in a two story home   Social Determinants of Health   Financial Resource Strain: Not on file  Food Insecurity: Not on file  Transportation Needs: Not on file  Physical Activity: Not on file  Stress: Not on file  Social Connections: Not on file     Family History:  The patient's family history includes Breast cancer in his mother; Colon cancer in his mother; Colon polyps in his mother; Diabetes in his daughter; Healthy in his brother; Heart attack in his maternal grandfather; Hyperlipidemia in his son; Prostate cancer in his father; Stroke in his father and paternal uncle; Uterine cancer in his mother.   ROS:   Please see the history of present illness.    ROS All other systems are reviewed and are negative   PHYSICAL EXAM:   VS:  BP (!) 143/89   Pulse 71   Ht '6\' 3"'$  (1.905 m)   Wt 200 lb (90.7 kg)   SpO2 97%   BMI 25.00 kg/m      General: Alert, oriented x3, no distress, appears fit, but does have abdominal adiposity. Head: no evidence of trauma, PERRL, EOMI, no exophtalmos or lid lag, no myxedema, no xanthelasma; normal ears, nose and oropharynx Neck: normal jugular venous pulsations and no hepatojugular reflux; brisk carotid pulses without delay and no carotid bruits Chest: clear to auscultation, no signs of consolidation by percussion or palpation, normal fremitus, symmetrical and full respiratory excursions Cardiovascular: normal position and quality of the apical impulse, regular rhythm, normal first and second heart sounds, no murmurs, rubs or gallops Abdomen: no tenderness or distention, no masses by palpation, no abnormal pulsatility or arterial bruits, normal bowel sounds, no hepatosplenomegaly Extremities: no clubbing, cyanosis or edema; 2+ radial, ulnar and brachial pulses bilaterally; 2+ right femoral,  posterior tibial and dorsalis pedis pulses; 2+ left femoral, posterior tibial and  dorsalis pedis pulses; no subclavian or femoral bruits Neurological: grossly nonfocal Psych: Normal mood and affect    Wt Readings from Last 3 Encounters:  12/17/22 200 lb (90.7 kg)  09/12/22 195 lb (88.5 kg)  09/07/22 200 lb (90.7 kg)      Studies/Labs Reviewed:   TEE 08/11/2021:  1. Left ventricular ejection fraction, by estimation, is 60 to 65%. The  left ventricle has normal function. The left ventricle has no regional  wall motion abnormalities.   2. Right ventricular systolic function is normal. The right ventricular  size is normal.   3. No left atrial/left atrial appendage thrombus was detected.   4. There is an Amplatzer closure device in place with no evidence of  atrial shunting or thrombus. Agitated saline contrast bubble study was  negative, with no evidence of any interatrial shunt.   5. The mitral valve is normal in structure. Trivial mitral valve  regurgitation. No evidence of mitral stenosis.   6. The aortic valve is tricuspid. Aortic valve regurgitation is not  visualized. No aortic stenosis is present.   7. The inferior vena cava is normal in size with greater than 50%  respiratory variability, suggesting right atrial pressure of 3 mmHg.   Conclusion(s)/Recommendation(s): Normal biventricular function without  evidence of hemodynamically significant valvular heart disease.   EKG:  EKG is  ordered today.  It shows normal sinus rhythm with first-degree AV block (PR interval 260 ms), mild left axis deviation, normal QTc 428 ms.  No ischemic changes. Recent Labs: 09/06/2022: ALT 36; BUN 21; Creatinine, Ser 1.01; Hemoglobin 15.9; Platelets 172.0; Potassium 4.3; Sodium 136 09/12/2022: TSH 2.27   Lipid Panel    Component Value Date/Time   CHOL 136 09/06/2022 0820   CHOL 183 02/07/2015 1535   TRIG 74.0 09/06/2022 0820   TRIG 81 02/07/2015 1535   HDL 55.70 09/06/2022 0820   HDL 63 02/07/2015 1535   CHOLHDL 2 09/06/2022 0820   VLDL 14.8 09/06/2022 0820    LDLCALC 65 09/06/2022 0820   LDLCALC 104 (H) 02/07/2015 1535   LDLDIRECT 172.0 07/24/2007 0000    ASSESSMENT:    1. Chronic left arterial ischemic stroke, ICA (internal carotid artery)   2. Carotid stenosis, left   3. Status post percutaneous patent foramen ovale closure   4. Mixed hyperlipidemia   5. Prediabetes       PLAN:  In order of problems listed above:   Common carotid artery stenosis: He is planning a follow-up visit to the [facility] to reevaluate this with some type of imaging that can assess plaque composition.  It was felt that this was a source of her stroke, despite minor stenosis, there was evidence of plaque vulnerability/intraplaque hemorrhage.  Tolerating full dose aspirin no GI side effects. PFO s/p closure 2019: Asymptomatic.  The closure remains effective.  MRI safe device.  Does not require endocarditis prophylaxis anymore. HLP: recent LDL 65 on the current dose of statin.  Continue. Prediabetes: The patient states     Medication Adjustments/Labs and Tests Ordered: Current medicines are reviewed at length with the patient today.  Concerns regarding medicines are outlined above.  Medication changes, Labs and Tests ordered today are listed in the Patient Instructions below. Patient Instructions  Medication Instructions:  No changes *If you need a refill on your cardiac medications before your next appointment, please call your pharmacy*  Follow-Up: At [facility], you and your  health needs are our priority.  As part of our continuing mission to provide you with exceptional heart care, we have created designated Provider Care Teams.  These Care Teams include your primary Cardiologist (physician) and Advanced Practice Providers (APPs -  Physician Assistants and Nurse Practitioners) who all work together to provide you with the care you need, when you need it.  We recommend signing up for the patient portal called "MyChart".  Sign up information is  provided on this After Visit Summary.  MyChart is used to connect with patients for Virtual Visits (Telemedicine).  Patients are able to view lab/test results, encounter notes, upcoming appointments, etc.  Non-urgent messages can be sent to your provider as well.   To learn more about what you can do with MyChart, go to [email or web address].    Your next appointment:   1 year(s)  Provider:   Sanda Klein, MD     Other Instructions Please keep a log of your BP for one week and send in via MyChart       Signed, Sanda Klein, MD  12/17/2022 9:25 AM    [address], [place], [place]  [address] Phone: [phone]; Fax: [phone]

## 2022-12-18 NOTE — Addendum Note (Signed)
Addended by: Orma Render on: 12/18/2022 07:34 AM   Modules accepted: Orders

## 2022-12-18 NOTE — Addendum Note (Signed)
Addended by: Sharee Holster on: 12/18/2022 02:55 PM   Modules accepted: Orders

## 2022-12-26 ENCOUNTER — Other Ambulatory Visit: Payer: Self-pay | Admitting: Internal Medicine

## 2023-03-18 ENCOUNTER — Ambulatory Visit: Payer: Commercial Managed Care - PPO | Admitting: Neurology

## 2023-03-18 ENCOUNTER — Encounter: Payer: Self-pay | Admitting: Neurology

## 2023-03-18 VITALS — BP 136/82 | HR 67 | Ht 75.0 in | Wt 202.0 lb

## 2023-03-18 DIAGNOSIS — R251 Tremor, unspecified: Secondary | ICD-10-CM

## 2023-03-18 DIAGNOSIS — Z8673 Personal history of transient ischemic attack (TIA), and cerebral infarction without residual deficits: Secondary | ICD-10-CM | POA: Diagnosis not present

## 2023-03-18 DIAGNOSIS — I779 Disorder of arteries and arterioles, unspecified: Secondary | ICD-10-CM | POA: Diagnosis not present

## 2023-03-18 NOTE — Progress Notes (Signed)
d   Follow-up Visit   Date: 03/18/23    Steve [name] MRN: [id number] DOB: [birth year]   Interim History: Steve [name] is a 72 y.o. right-handed Caucasian male with right hemifacial spasm, hyperlipidemia, BPH, GERD, and left semiovale stroke (2016, manifesting with right arm weakness and facial numbness), prostate cancer (2021) returning to the clinic for with left embolic stroke (69/6295).  History of present illness: On April 23rd 2015, he woke up with right arm heaviness/weakness and right sided paresthesias. There was no weakness of the right lower extremity or the face.  Symptoms resolved within about 6 hours.  He has noticed that his signature and hand writing is showing subtle differences.  He stopped taking aspirin several years ago and restarted aspirin 81mg  when these symptoms started.  MRI brain showed small left centrum semiovale stroke.  There is no evidence of large vessel occlusion. Echocardiogram is normal except moderate right-to-left shunt without atrial septal aneurysm. He had PFO closure on 03/27/18.  He has seen Dr. Sandria Manly in the late 1990s for hemifacial spasm and gets botox injected.  In 2019, he began having left sided tightness of the thigh . Sometimes, he has difficulty raising the left leg, such as with wearing pants.  There is some associated low back pain.  He has seen [facility] in the past and had MRI lumbar spine which showed degenerative changes at L4-5.  UPDATE 09/13/2021:  He was admitted to [facility] in October with acute onset of word-finding difficulty, which he noticed especially when trying to type emails.  He went to the ER where MRI confirmed scattered left hemisphere embolic stroke.  TEE was stable, no shunt.  Zio patch monitor did not detect atrial fibrillation.  MRA did not show any large vessel stenosis/occlusion.  He had self discontinued aspirin, but was planning on starting it that day.  Since discharge, he has completed 3 weeks of  ASA 81mg  + plavix 75mg  and now on aspirin 81mg  daily.  Statin therapy was also changed to crestor 20mg  which has improved his LDL from 104 down to 61.  He has noticed some improvement, but is not back to baseline. He did seek second opinion at [facility] and was seen by Dr. Maurie Boettcher who recommended repeat MRA neck with plaque protocol due to concern of possible plaque at the left carotid bifurcation.  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 He was suggested to increase aspirin to 325mg  and continue crestor.   UPDATE 09/12/2022:  He is here for follow-up visit.  In April, he had one day of dizziness and unsteadiness.  He had MRI brain which did not show acute changes.  He also had repeat MRA neck plaque protocol at [facility] which showed stable intraplaque hemorrhage.  He was suggested to have repeat scan in 6-7 months.  No interval TIA or stroke.   Over the past several months, he has noticed greater difficulty with writing because of right hand tremor.  Tremor mild but it is bothersome.  No family history of tremors.    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 Because of this pain, he is unable to enjoy outdoor activities, such as golf.  UPDATE 03/18/2023:  He is here for 6 month  follow-up visit.  He is doing wel and denies any new neurological symptoms.  At his last visit, I started propranolol 20mg  BID for tremor, however, the medication made him tired and wife said he was depressed, so he stopped it.  He continues to have intermittent tremor of the right hand which is bothersome when he is trying to drink coffee or place a golf ball on the tee, but symptoms are not all the time.  His blood pressure is elevated today 147/80 and he tells me  that the last time he saw his cardiology it was elevated too.  He was told to monitor BP for 5 days, but admits that he has not done this.  He is scheduled to have follow-up MRA neck at [facility] next week.   Medications:  Current Outpatient Medications on File Prior to Visit  Medication Sig Dispense Refill   aspirin 325 MG tablet Take 325 mg by mouth daily. 1 Tablet Daily in The Morning     botulinum toxin Type A (BOTOX) 100 units SOLR injection Inject 100 Units into the muscle every 3 (three) months.     cholecalciferol (VITAMIN D3) 25 MCG (1000 UNIT) tablet Take 1,000 Units by mouth daily.     fluticasone (FLONASE) 50 MCG/ACT nasal spray ONE SPRAY IN EACH NOSTRIL TWICE DAILY ASNEEDED -USE CROSSOVER TECHNIQUE DISCUSSED (Patient taking differently: Place 1 spray into both nostrils 2 (two) times daily as needed for allergies or rhinitis.) 16 g 0   Multiple Vitamins-Minerals (PRESERVISION AREDS PO) Take 1 capsule by mouth 2 (two) times daily.     pantoprazole (PROTONIX) 40 MG tablet TAKE ONE TABLET BY MOUTH EVERY DAY 30 tablet 2   Polyethyl Glycol-Propyl Glycol (SYSTANE) 0.4-0.3 % GEL ophthalmic gel Place 1 application into both eyes at bedtime.     rosuvastatin (CRESTOR) 40 MG tablet 40 mg daily.     SOLIFENACIN SUCCINATE PO Take 10 mg by mouth daily.     vardenafil (LEVITRA) 20 MG tablet Take 20 mg by mouth daily as needed for erectile dysfunction.     vitamin B-12 (CYANOCOBALAMIN) 1000 MCG tablet Take 1,000 mcg by mouth daily.     vitamin C (ASCORBIC ACID) 500 MG tablet Take 500 mg by mouth daily.     Current Facility-Administered Medications on File Prior to Visit  Medication Dose Route Frequency Provider Last Rate Last Admin   lidocaine-EPINEPHrine (XYLOCAINE W/EPI) 2 %-1:100000 (with pres) injection 10 mL  10 mL Intradermal Once Croitoru, Mihai, MD        Allergies:  Allergies  Allergen Reactions   Pollen Extract Itching    Eyes itch and water     Vital Signs:  BP 136/82    Pulse 67   Ht 6\' 3"  (1.905 m)   Wt 202 lb (91.6 kg)   SpO2 91%   BMI 25.25 kg/m   Neurological Exam: MENTAL STATUS including orientation to time, place, person, recent and remote memory, attention span and concentration, language, and fund of knowledge is normal.  No dysarthria.   CRANIAL NERVES:   Normal conjugate, extra-ocular eye movements in all directions of gaze. Right facial asymmetry involving the forehead and cheek, asymmetrical smile on the right (due to botox administration for hemifacial spasm).  Mild right ptosis.  Intermittent right hemifacial spasm  MOTOR:  Motor strength is 5/5 in all extremities. Fine tremor of the hands when outstretched and worse with finger to nose testing at endpoint.  No rest tremor.    No pronator  drift.  Tone is normal.    SENSORY: Intact vibration throughout  REFLEX: Reflexes are 2+/4 throughout  COORDINATION/GAIT:  Normal finger-to- nose-finger.  Intact rapid alternating movements bilaterally.  Gait narrow based and stable.  Data: MRI/A brain and neck 03/18/2015: 1. Mild white matter signal changes in the left posterior centrum semiovale with subtle diffusion signal changes suggestive of subacute white matter infarct. No mass effect or hemorrhage. 2. Otherwise normal for age noncontrast MRI appearance of the brain. 3. Left greater than right carotid bifurcation atherosclerosis with no hemodynamically significant stenosis in the neck. 4.  Negative intracranial MRA.  Lab Results  Component Value Date   CHOL 136 09/06/2022   HDL 55.70 09/06/2022   LDLCALC 65 09/06/2022   LDLDIRECT 172.0 07/24/2007   TRIG 74.0 09/06/2022   CHOLHDL 2 09/06/2022   Lab Results  Component Value Date   HGBA1C 6.1 09/06/2022   Lab Results  Component Value Date   TSH 2.27 09/12/2022    Echocardiogram 03/14/2015: - Left ventricle: The cavity size was normal. Systolic function was   normal. The estimated ejection fraction was in the range of 55%  to 60%. Wall  motion was normal; there were no regional wall motion abnormalities. Left ventricular diastolic function   parameters were normal. - Atrial septum: There was a moderate right-to-left atrial level shunt, in the baseline state. There was redundancy of the septum,   with borderline criteria for aneurysm.  TEE 01/13/2018: - Left ventricle: The cavity size was normal. Wall thickness was normal. Systolic function was normal. The estimated ejection   fraction was in the range of 55% to 60%. Wall motion was normal; there were no regional wall motion abnormalities.  - Left atrium: No evidence of thrombus in the atrial cavity or appendage. - Right atrium: No evidence of thrombus in the atrial cavity or appendage. - Atrial septum: There was a small secundum atrial septal defect. Doppler showed a small bidirectional atrial level shunt, in the   baseline state. 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 Acute infarcts in the left frontal and parietal lobes, with the largest area involving the left posteroinferior frontal cortex. Multiple punctate infarcts in multiple vascular territories suggest embolic etiology. 2. No intracranial large vessel occlusion. 3. No hemodynamically significant stenosis in the neck.  MRI brain wo contrast 01/25/2022: 1. No recent insult or specific cause for symptoms. 2. Remote left cerebral infarcts.   IMPRESSION/PLAN: 1.  Left hemisphere embolic stroke (21/3086) manifesting with expressive aphasia.  Etiology - LICA intraplaque hemorrhage as seen on dedicated plaque protocol MRA at the [facility] in the setting of being off antiplatelet therapy. Repeat imaging  shows stable plaque. Clinically, he is doing very well.  He completed dual antiplatelet therapy and has been on monotherapy with aspirin.  - Continue aspirin 325mg  daily  - Continue crestor 20mg  daily (LDL 65)  - Surveillance MRI neck scheduled next week at [facility]. If there continues to be intraplaque hemorrage that is expanding or he has new neurological symptoms, refer to vascular surgery for left CEA/stenting.    2. History of small left posterior centrum semiovale stroke manifesting with transient R arm weakness and hemisensory loss in April 2016. 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  He is s/p PFO closure in 2019.  No cardiac arrhythmia on loop recorder.  3.  Essential tremor of the right hand,intermittent  - Previously tried propranolol 20mg  BID, but developed fatigue and depression, so stopped it  - Monitor off medication for now  4. Elevated blood pressure.  - Monitor BP at home and share with cardiologist  5.  Right hemifacial spasm, followed by Dr. Calton Dach in [place], [place]  Return to clinic in 6 months  Total time spent reviewing records, interview, history/exam, documentation, and coordination of care on day of encounter:  20 min    Thank you for allowing me to participate in patient's care.  If I can answer any additional questions, I would be pleased to do so.    Sincerely,    Bless Belshe K. Allena Katz, DO

## 2023-03-18 NOTE — Patient Instructions (Signed)
Monitor your blood pressure at home and share with your cardiologist.    I will see you back in 6 months

## 2023-03-20 ENCOUNTER — Other Ambulatory Visit: Payer: Self-pay | Admitting: Internal Medicine

## 2023-04-12 ENCOUNTER — Other Ambulatory Visit: Payer: Self-pay | Admitting: Internal Medicine

## 2023-07-01 ENCOUNTER — Other Ambulatory Visit: Payer: Self-pay | Admitting: Internal Medicine

## 2023-08-19 ENCOUNTER — Other Ambulatory Visit: Payer: Self-pay | Admitting: Internal Medicine

## 2023-08-19 ENCOUNTER — Telehealth: Payer: Self-pay

## 2023-08-19 ENCOUNTER — Other Ambulatory Visit: Payer: Self-pay

## 2023-08-19 MED ORDER — ROSUVASTATIN CALCIUM 20 MG PO TABS: 20.0000 mg | ORAL_TABLET | Freq: Every day | ORAL | 3 refills | Status: DC

## 2023-08-20 ENCOUNTER — Telehealth: Payer: Self-pay | Admitting: Internal Medicine

## 2023-08-20 NOTE — Telephone Encounter (Signed)
20 mg sent in for patient yesterday.

## 2023-08-20 NOTE — Telephone Encounter (Signed)
Patient said he was returning a call from [place]. He would like a call back at [phone].

## 2023-09-17 ENCOUNTER — Ambulatory Visit: Payer: Commercial Managed Care - PPO | Admitting: Neurology

## 2023-09-18 ENCOUNTER — Encounter: Payer: Self-pay | Admitting: Family Medicine

## 2023-09-18 ENCOUNTER — Ambulatory Visit: Payer: Commercial Managed Care - PPO | Admitting: Family Medicine

## 2023-09-18 ENCOUNTER — Telehealth: Payer: Self-pay | Admitting: Cardiovascular Disease

## 2023-09-18 VITALS — BP 108/70 | HR 90 | Temp 98.6°F | Ht 75.0 in | Wt 202.1 lb

## 2023-09-18 DIAGNOSIS — I440 Atrioventricular block, first degree: Secondary | ICD-10-CM | POA: Diagnosis not present

## 2023-09-18 DIAGNOSIS — N400 Enlarged prostate without lower urinary tract symptoms: Secondary | ICD-10-CM | POA: Diagnosis not present

## 2023-09-18 DIAGNOSIS — I959 Hypotension, unspecified: Secondary | ICD-10-CM | POA: Diagnosis not present

## 2023-09-18 DIAGNOSIS — I6930 Unspecified sequelae of cerebral infarction: Secondary | ICD-10-CM

## 2023-09-18 DIAGNOSIS — R42 Dizziness and giddiness: Secondary | ICD-10-CM

## 2023-09-18 LAB — CBC WITH DIFFERENTIAL/PLATELET
Basophils Absolute: 0.1 10*3/uL (ref 0.0–0.1)
Basophils Relative: 0.7 % (ref 0.0–3.0)
Eosinophils Absolute: 0.1 10*3/uL (ref 0.0–0.7)
Eosinophils Relative: 1.3 % (ref 0.0–5.0)
HCT: 45.7 % (ref 39.0–52.0)
Hemoglobin: 15.5 g/dL (ref 13.0–17.0)
Lymphocytes Relative: 19.1 % (ref 12.0–46.0)
Lymphs Abs: 1.4 10*3/uL (ref 0.7–4.0)
MCHC: 33.9 g/dL (ref 30.0–36.0)
MCV: 94 fL (ref 78.0–100.0)
Monocytes Absolute: 0.5 10*3/uL (ref 0.1–1.0)
Monocytes Relative: 7.4 % (ref 3.0–12.0)
Neutro Abs: 5.2 10*3/uL (ref 1.4–7.7)
Neutrophils Relative %: 71.5 % (ref 43.0–77.0)
Platelets: 201 10*3/uL (ref 150.0–400.0)
RBC: 4.87 Mil/uL (ref 4.22–5.81)
RDW: 13.4 % (ref 11.5–15.5)
WBC: 7.3 10*3/uL (ref 4.0–10.5)

## 2023-09-18 LAB — COMPREHENSIVE METABOLIC PANEL
ALT: 29 U/L (ref 0–53)
AST: 26 U/L (ref 0–37)
Albumin: 4.5 g/dL (ref 3.5–5.2)
Alkaline Phosphatase: 71 U/L (ref 39–117)
BUN: 14 mg/dL (ref 6–23)
CO2: 30 meq/L (ref 19–32)
Calcium: 9.3 mg/dL (ref 8.4–10.5)
Chloride: 102 meq/L (ref 96–112)
Creatinine, Ser: 1.01 mg/dL (ref 0.40–1.50)
GFR: 73.69 mL/min (ref >60.00)
Glucose, Bld: 117 mg/dL — ABNORMAL HIGH (ref 70–99)
Potassium: 4.7 meq/L (ref 3.5–5.1)
Sodium: 138 meq/L (ref 135–145)
Total Bilirubin: 0.4 mg/dL (ref 0.2–1.2)
Total Protein: 7.3 g/dL (ref 6.0–8.3)

## 2023-09-18 NOTE — Telephone Encounter (Signed)
Called and spoke to patient who is calling about his BP being low this morning. Patient states he was at the gym about 7 am this morning and he became lightheaded. He also reported blurred vision in his right eye. Pt deny CP, SOB, headache, dizziness or any other symptoms at this time. His BP was 93/62 HR 105 at that time. After about 20 minutes he rechecked his BP and it was 115/75 HR 85. Patient is not on any BP medication. Advised patient to drink more fluids, elevate his feet and do not get up to fast. He did call his PCP and is scheduled to see them at 10 am this morning. Please advise.

## 2023-09-18 NOTE — Telephone Encounter (Signed)
Called patient and below message relayed. No further questions at this time.   Croitoru, Rachelle Hora, MD  Physician Cardiology  Dr. Lawerance Bach will advise of next steps.

## 2023-09-18 NOTE — Patient Instructions (Addendum)
EKG today shows a 1st degree heart block, but is unchanged from your last.  We are checking labs today, will be in contact with any results that require further attention  I would also have you check in with cardiology to follow-up on this as well.  Follow-up with me for new or worsening symptoms.

## 2023-09-18 NOTE — Telephone Encounter (Signed)
Dr. Lawerance Bach will advise of next steps.

## 2023-09-18 NOTE — Telephone Encounter (Signed)
Pt c/o BP issue: STAT if pt c/o blurred vision, one-sided weakness or slurred speech  1. What are your last 5 BP readings? 93/64  2. Are you having any other symptoms (ex. Dizziness, headache, blurred vision, passed out)? Dizziness and lightheadedness  3. What is your BP issue? Pt was exercising this morning and after he finished his cardio workout he had concerns about his BP. Pt is requesting a callback regarding this. Please advise

## 2023-09-18 NOTE — Progress Notes (Signed)
Acute Office Visit  Subjective:     Patient ID: Steve [name], male    DOB: 1951-03-11, 72 y.o.   MRN: [id number]  Chief Complaint  Patient presents with   Medical Management of Chronic Issues    After work out, was dizzy and BP was taken (93/65) And heart rate was 105  115/75, normally low than his normal     HPI Patient is in today for evaluation of episode of dizziness with low blood pressure 90s over 60s, with a heart rate of 105.   Reports that he was at the gym and this occurred after leg presses.  States that he does not normally eat before he goes to the gym, usually eats when he gets home.  Reports that he has some cloudy vision from the right eye during an episode as well.  States that he sat down for about 20 minutes and blood pressure came back up to 110s/70s. Denies numbness, tingling, headache, nausea, vomiting, diarrhea, rash, other symptoms. Reports compliance with medication regimen. Denies other concerns today. Medical hx as outlined below.  ROS Per HPI      Objective:    BP 108/70   Pulse 90   Temp 98.6 F (37 C) (Temporal)   Ht 6\' 3"  (1.905 m)   Wt 202 lb 2 oz (91.7 kg)   SpO2 94%   BMI 25.26 kg/m    Physical Exam Vitals and nursing note reviewed.  Constitutional:      Appearance: Normal appearance.  HENT:     Head: Normocephalic and atraumatic.     Comments: R facial paralysis to eyelid and lips Eyes:     Extraocular Movements: Extraocular movements intact.  Neck:     Vascular: No carotid bruit.  Cardiovascular:     Rate and Rhythm: Normal rate and regular rhythm.     Pulses: Normal pulses.     Heart sounds: Normal heart sounds.  Pulmonary:     Effort: Pulmonary effort is normal.     Breath sounds: Normal breath sounds.  Musculoskeletal:        General: Normal range of motion.     Cervical back: Normal range of motion.     Right lower leg: No edema.     Left lower leg: No edema.  Skin:    General: Skin is warm and dry.   Neurological:     General: No focal deficit present.     Mental Status: He is alert and oriented to person, place, and time.  Psychiatric:        Mood and Affect: Mood normal.        Behavior: Behavior normal.     No results found for any visits on 09/18/23.      Assessment & Plan:  1. Dizziness  - EKG 12-Lead shows first-degree AV block, unchanged from previous - CBC with Differential/Platelet - Comprehensive metabolic panel -Discussed that this could likely be related to a vasovagal response doing leg presses, orthostatic changes, possibly dehydration -Discussed increasing fluids and having at least a snack before he goes and works out  2. Hypotension, unspecified hypotension type  - EKG 12-Lead - CBC with Differential/Platelet - Comprehensive metabolic panel - orthostatic VS unrevealing -If this occurs again, or hypotension continues, discussed for him to follow-up with cardiology as well -Discussed wearing compression socks, especially working out to help stabilize blood pressures  3. Benign prostatic hyperplasia, unspecified whether lower urinary tract symptoms present  - silodosin (RAPAFLO) 8 MG  CAPS capsule; Take 8 mg by mouth daily with breakfast.  4. First degree AV block  -EKG unchanged from previous, interpreted by me today -Results discussed with patient in office -Will f/u with cardiology as scheduled  5. Hx CVA with residual deficit  - R facial paralysis - [facility] to [facility] as scheduled  No orders of the defined types were placed in this encounter.   Return if symptoms worsen or fail to improve.  Moshe Cipro, FNP

## 2023-09-19 NOTE — Telephone Encounter (Signed)
Completed.

## 2023-09-24 NOTE — Progress Notes (Unsigned)
Subjective:    Patient ID: Steve [name], male    DOB: 1951/02/26, 72 y.o.   MRN: [id number]     HPI Champ is here for a physical exam and his chronic medical problems.  Overall doing well.  He would like to get his weight down a little bit.  He is exercising regularly and thinks he may need to increase his cardio.   Medications and allergies reviewed with patient and updated if appropriate.  Current Outpatient Medications on File Prior to Visit  Medication Sig Dispense Refill   aspirin 325 MG tablet Take 325 mg by mouth daily. 1 Tablet Daily in The Morning     botulinum toxin Type A (BOTOX) 100 units SOLR injection Inject 100 Units into the muscle every 3 (three) months.     cholecalciferol (VITAMIN D3) 25 MCG (1000 UNIT) tablet Take 1,000 Units by mouth daily.     fluticasone (FLONASE) 50 MCG/ACT nasal spray ONE SPRAY IN EACH NOSTRIL TWICE DAILY ASNEEDED -USE CROSSOVER TECHNIQUE DISCUSSED (Patient taking differently: Place 1 spray into both nostrils 2 (two) times daily as needed for allergies or rhinitis.) 16 g 0   Multiple Vitamins-Minerals (PRESERVISION AREDS PO) Take 1 capsule by mouth 2 (two) times daily.     pantoprazole (PROTONIX) 40 MG tablet TAKE ONE TABLET BY MOUTH EVERY DAY 30 tablet 2   Polyethyl Glycol-Propyl Glycol (SYSTANE) 0.4-0.3 % GEL ophthalmic gel Place 1 application into both eyes at bedtime.     rosuvastatin (CRESTOR) 20 MG tablet Take 1 tablet (20 mg total) by mouth daily. 90 tablet 3   silodosin (RAPAFLO) 8 MG CAPS capsule Take 8 mg by mouth daily with breakfast.     SOLIFENACIN SUCCINATE PO Take 10 mg by mouth daily.     tamsulosin (FLOMAX) 0.4 MG CAPS capsule Take 0.4 mg by mouth daily.     vardenafil (LEVITRA) 20 MG tablet Take 20 mg by mouth daily as needed for erectile dysfunction.     vitamin B-12 (CYANOCOBALAMIN) 1000 MCG tablet Take 1,000 mcg by mouth daily.     vitamin C (ASCORBIC ACID) 500 MG tablet Take 500 mg by mouth daily.     Current  Facility-Administered Medications on File Prior to Visit  Medication Dose Route Frequency Provider Last Rate Last Admin   lidocaine-EPINEPHrine (XYLOCAINE W/EPI) 2 %-1:100000 (with pres) injection 10 mL  10 mL Intradermal Once Croitoru, Mihai, MD        Review of Systems  Constitutional:  Negative for fever.  HENT:  Positive for sinus pressure (left maxillary sinus).   Eyes:  Negative for visual disturbance.  Respiratory:  Negative for cough, shortness of breath and wheezing.   Cardiovascular:  Negative for chest pain, palpitations and leg swelling.  Gastrointestinal:  Negative for abdominal pain, blood in stool, constipation and diarrhea.       No gerd  Genitourinary:  Negative for difficulty urinating.  Musculoskeletal:  Negative for arthralgias and back pain.  Skin:  Negative for rash.  Neurological:  Negative for light-headedness and headaches.  Psychiatric/Behavioral:  Negative for dysphoric mood. The patient is not nervous/anxious.        Objective:   Vitals:   09/25/23 1521  BP: 110/76  Pulse: 74  Temp: 98 F (36.7 C)  SpO2: 97%   Filed Weights   09/25/23 1521  Weight: 201 lb (91.2 kg)   Body mass index is 25.12 kg/m.  BP Readings from Last 3 Encounters:  09/25/23 110/76  09/18/23 108/70  03/18/23 136/82    Wt Readings from Last 3 Encounters:  09/25/23 201 lb (91.2 kg)  09/18/23 202 lb 2 oz (91.7 kg)  03/18/23 202 lb (91.6 kg)      Physical Exam Constitutional: He appears well-developed and well-nourished. No distress.  HENT:  Head: Normocephalic and atraumatic.  Right Ear: External ear normal.  Left Ear: External ear normal.  Normal ear canals and TM b/l  Mouth/Throat: Oropharynx is clear and moist. Eyes: Conjunctivae and EOM are normal.  Neck: Neck supple. No tracheal deviation present. No thyromegaly present.  No carotid bruit  Cardiovascular: Normal rate, regular rhythm, normal heart sounds and intact distal pulses.   No murmur heard.  No  lower extremity edema. Pulmonary/Chest: Effort normal and breath sounds normal. No respiratory distress. He has no wheezes. He has no rales.  Abdominal: Soft.  Ventral hernia-reducible and nontender.  He exhibits no distension. There is no tenderness.  Genitourinary: deferred  Lymphadenopathy:   He has no cervical adenopathy.  Skin: Skin is warm and dry. He is not diaphoretic.  Psychiatric: He has a normal mood and affect. His behavior is normal.         Assessment & Plan:   Physical exam: Screening blood work  ordered Exercise   regular Weight  normal Substance abuse   none   Reviewed recommended immunizations.  Flu immunization administered today.     Health Maintenance  Topic Date Due   COVID-19 Vaccine (4 - 2024-25 season) 10/11/2023 (Originally 06/09/2023)   DTaP/Tdap/Td (3 - Td or Tdap) 09/24/2024 (Originally 03/09/2023)   Colonoscopy  09/08/2027   Pneumonia Vaccine 28+ Years old  Completed   INFLUENZA VACCINE  Completed   Hepatitis C Screening  Completed   Zoster Vaccines- Shingrix  Completed   HPV VACCINES  Aged Out     See Problem List for Assessment and Plan of chronic medical problems.

## 2023-09-24 NOTE — Patient Instructions (Addendum)
Flu immunization administered today.     Blood work was ordered.       Medications changes include :   amoxicillin     Return in about 1 year (around 09/24/2024) for Physical Exam.    Health Maintenance, Male Adopting a healthy lifestyle and getting preventive care are important in promoting health and wellness. Ask your health care provider about: The right schedule for you to have regular tests and exams. Things you can do on your own to prevent diseases and keep yourself healthy. What should I know about diet, weight, and exercise? Eat a healthy diet  Eat a diet that includes plenty of vegetables, fruits, low-fat dairy products, and lean protein. Do not eat a lot of foods that are high in solid fats, added sugars, or sodium. Maintain a healthy weight Body mass index (BMI) is a measurement that can be used to identify possible weight problems. It estimates body fat based on height and weight. Your health care provider can help determine your BMI and help you achieve or maintain a healthy weight. Get regular exercise Get regular exercise. This is one of the most important things you can do for your health. Most adults should: Exercise for at least 150 minutes each week. The exercise should increase your heart rate and make you sweat (moderate-intensity exercise). Do strengthening exercises at least twice a week. This is in addition to the moderate-intensity exercise. Spend less time sitting. Even light physical activity can be beneficial. Watch cholesterol and blood lipids Have your blood tested for lipids and cholesterol at 73 years of age, then have this test every 5 years. You may need to have your cholesterol levels checked more often if: Your lipid or cholesterol levels are high. You are older than 74 years of age. You are at high risk for heart disease. What should I know about cancer screening? Many types of cancers can be detected early and may often be  prevented. Depending on your health history and family history, you may need to have cancer screening at various ages. This may include screening for: Colorectal cancer. Prostate cancer. Skin cancer. Lung cancer. What should I know about heart disease, diabetes, and high blood pressure? Blood pressure and heart disease High blood pressure causes heart disease and increases the risk of stroke. This is more likely to develop in people who have high blood pressure readings or are overweight. Talk with your health care provider about your target blood pressure readings. Have your blood pressure checked: Every 3-5 years if you are 23-2 years of age. Every year if you are 31 years old or older. If you are between the ages of 109 and 75 and are a current or former smoker, ask your health care provider if you should have a one-time screening for abdominal aortic aneurysm (AAA). Diabetes Have regular diabetes screenings. This checks your fasting blood sugar level. Have the screening done: Once every three years after age 27 if you are at a normal weight and have a low risk for diabetes. More often and at a younger age if you are overweight or have a high risk for diabetes. What should I know about preventing infection? Hepatitis B If you have a higher risk for hepatitis B, you should be screened for this virus. Talk with your health care provider to find out if you are at risk for hepatitis B infection. Hepatitis C Blood testing is recommended for: Everyone born from [birth year] through 1966. Anyone with known  risk factors for hepatitis C. Sexually transmitted infections (STIs) You should be screened each year for STIs, including gonorrhea and chlamydia, if: You are sexually active and are younger than 73 years of age. You are older than 73 years of age and your health care provider tells you that you are at risk for this type of infection. Your sexual activity has changed since you were last screened,  and you are at increased risk for chlamydia or gonorrhea. Ask your health care provider if you are at risk. Ask your health care provider about whether you are at high risk for HIV. Your health care provider may recommend a prescription medicine to help prevent HIV infection. If you choose to take medicine to prevent HIV, you should first get tested for HIV. You should then be tested every 3 months for as long as you are taking the medicine. Follow these instructions at home: Alcohol use Do not drink alcohol if your health care provider tells you not to drink. If you drink alcohol: Limit how much you have to 0-2 drinks a day. Know how much alcohol is in your drink. In the [place] one drink equals one 12 oz bottle of beer (355 mL), one 5 oz glass of wine (148 mL), or one 1 oz glass of hard liquor (44 mL). Lifestyle Do not use any products that contain nicotine or tobacco. These products include cigarettes, chewing tobacco, and vaping devices, such as e-cigarettes. If you need help quitting, ask your health care provider. Do not use street drugs. Do not share needles. Ask your health care provider for help if you need support or information about quitting drugs. General instructions Schedule regular health, dental, and eye exams. Stay current with your vaccines. Tell your health care provider if: You often feel depressed. You have ever been abused or do not feel safe at home. Summary Adopting a healthy lifestyle and getting preventive care are important in promoting health and wellness. Follow your health care provider's instructions about healthy diet, exercising, and getting tested or screened for diseases. Follow your health care provider's instructions on monitoring your cholesterol and blood pressure. This information is not intended to replace advice given to you by your health care provider. Make sure you discuss any questions you have with your health care provider. Document Revised:  02/13/2021 Document Reviewed: 02/13/2021 Elsevier Patient Education  2024 [employer].

## 2023-09-25 ENCOUNTER — Ambulatory Visit: Payer: Commercial Managed Care - PPO | Admitting: Internal Medicine

## 2023-09-25 ENCOUNTER — Encounter: Payer: Self-pay | Admitting: Internal Medicine

## 2023-09-25 VITALS — BP 110/76 | HR 74 | Temp 98.0°F | Ht 75.0 in | Wt 201.0 lb

## 2023-09-25 DIAGNOSIS — R7303 Prediabetes: Secondary | ICD-10-CM

## 2023-09-25 DIAGNOSIS — Z0000 Encounter for general adult medical examination without abnormal findings: Secondary | ICD-10-CM | POA: Diagnosis not present

## 2023-09-25 DIAGNOSIS — E782 Mixed hyperlipidemia: Secondary | ICD-10-CM | POA: Diagnosis not present

## 2023-09-25 DIAGNOSIS — Z23 Encounter for immunization: Secondary | ICD-10-CM | POA: Diagnosis not present

## 2023-09-25 DIAGNOSIS — K219 Gastro-esophageal reflux disease without esophagitis: Secondary | ICD-10-CM

## 2023-09-25 DIAGNOSIS — E538 Deficiency of other specified B group vitamins: Secondary | ICD-10-CM

## 2023-09-25 DIAGNOSIS — I63239 Cerebral infarction due to unspecified occlusion or stenosis of unspecified carotid arteries: Secondary | ICD-10-CM

## 2023-09-25 DIAGNOSIS — N400 Enlarged prostate without lower urinary tract symptoms: Secondary | ICD-10-CM

## 2023-09-25 DIAGNOSIS — E559 Vitamin D deficiency, unspecified: Secondary | ICD-10-CM

## 2023-09-25 DIAGNOSIS — Z136 Encounter for screening for cardiovascular disorders: Secondary | ICD-10-CM

## 2023-09-25 DIAGNOSIS — I6930 Unspecified sequelae of cerebral infarction: Secondary | ICD-10-CM

## 2023-09-25 MED ORDER — AMOXICILLIN 500 MG PO CAPS: 500.0000 mg | ORAL_CAPSULE | Freq: Three times a day (TID) | ORAL | 0 refills | Status: AC

## 2023-09-25 NOTE — Assessment & Plan Note (Signed)
Chronic Check lipid panel, CMP, CBC Continue crestor 20 mg  Regular exercise and healthy diet encouraged

## 2023-09-25 NOTE — Assessment & Plan Note (Signed)
Chronic Check B12 level Continue B12 supplementation 

## 2023-09-25 NOTE — Assessment & Plan Note (Addendum)
History of CVA Deficit in fine motor skills and right hand Continue Crestor 20 mg, aspirin 325 mg daily

## 2023-09-25 NOTE — Assessment & Plan Note (Signed)
Chronic Management per urology

## 2023-09-25 NOTE — Assessment & Plan Note (Signed)
Chronic GERD controlled Continue pantoprazole 40 mg daily given history of esophageal stricture

## 2023-09-25 NOTE — Assessment & Plan Note (Deleted)
History of CVA Deficit in fine motor skills and right hand Continue Crestor 20 mg daily, aspirin 325 mg daily

## 2023-09-25 NOTE — Assessment & Plan Note (Signed)
Chronic Taking vitamin D supplementation daily Check vitamin d level

## 2023-09-25 NOTE — Assessment & Plan Note (Signed)
Chronic Following at the [facility] and with cardiology Does not have significant stenosis, but there was evidence on imaging that a piece of plaque broke off from the carotid artery and that is what caused her stroke Being monitored at the [facility] annually

## 2023-09-25 NOTE — Assessment & Plan Note (Signed)
Chronic Lab Results  Component Value Date   HGBA1C 6.1 09/06/2022   Check a1c Low sugar / carb diet Stressed regular exercise

## 2023-09-30 ENCOUNTER — Other Ambulatory Visit: Payer: Self-pay | Admitting: Internal Medicine

## 2023-10-15 ENCOUNTER — Other Ambulatory Visit (INDEPENDENT_AMBULATORY_CARE_PROVIDER_SITE_OTHER): Payer: Commercial Managed Care - PPO

## 2023-10-15 DIAGNOSIS — E538 Deficiency of other specified B group vitamins: Secondary | ICD-10-CM | POA: Diagnosis not present

## 2023-10-15 DIAGNOSIS — E559 Vitamin D deficiency, unspecified: Secondary | ICD-10-CM | POA: Diagnosis not present

## 2023-10-15 DIAGNOSIS — R7303 Prediabetes: Secondary | ICD-10-CM | POA: Diagnosis not present

## 2023-10-15 DIAGNOSIS — E782 Mixed hyperlipidemia: Secondary | ICD-10-CM

## 2023-10-15 LAB — LIPID PANEL
Cholesterol: 149 mg/dL (ref 0–200)
HDL: 48.8 mg/dL (ref >39.00)
LDL Cholesterol: 73 mg/dL (ref 0–99)
NonHDL: 100.06
Total CHOL/HDL Ratio: 3
Triglycerides: 133 mg/dL (ref 0.0–149.0)
VLDL: 26.6 mg/dL (ref 0.0–40.0)

## 2023-10-15 LAB — COMPREHENSIVE METABOLIC PANEL
ALT: 33 U/L (ref 0–53)
AST: 28 U/L (ref 0–37)
Albumin: 4.7 g/dL (ref 3.5–5.2)
Alkaline Phosphatase: 66 U/L (ref 39–117)
BUN: 17 mg/dL (ref 6–23)
CO2: 30 meq/L (ref 19–32)
Calcium: 9.3 mg/dL (ref 8.4–10.5)
Chloride: 99 meq/L (ref 96–112)
Creatinine, Ser: 1.09 mg/dL (ref 0.40–1.50)
GFR: 67.21 mL/min (ref >60.00)
Glucose, Bld: 110 mg/dL — ABNORMAL HIGH (ref 70–99)
Potassium: 4.8 meq/L (ref 3.5–5.1)
Sodium: 137 meq/L (ref 135–145)
Total Bilirubin: 0.7 mg/dL (ref 0.2–1.2)
Total Protein: 7.5 g/dL (ref 6.0–8.3)

## 2023-10-15 LAB — VITAMIN D 25 HYDROXY (VIT D DEFICIENCY, FRACTURES): VITD: 46.5 ng/mL (ref 30.00–100.00)

## 2023-10-15 LAB — VITAMIN B12: Vitamin B-12: 580 pg/mL (ref 211–911)

## 2023-10-15 LAB — HEMOGLOBIN A1C: Hgb A1c MFr Bld: 6.2 % (ref 4.6–6.5)

## 2023-10-18 ENCOUNTER — Encounter: Payer: Self-pay | Admitting: Internal Medicine

## 2023-10-21 ENCOUNTER — Encounter: Payer: Self-pay | Admitting: Neurology

## 2023-10-21 ENCOUNTER — Ambulatory Visit: Payer: Commercial Managed Care - PPO | Admitting: Neurology

## 2023-10-21 VITALS — BP 132/78 | HR 70 | Ht 75.0 in | Wt 206.0 lb

## 2023-10-21 DIAGNOSIS — R251 Tremor, unspecified: Secondary | ICD-10-CM

## 2023-10-21 DIAGNOSIS — Z8673 Personal history of transient ischemic attack (TIA), and cerebral infarction without residual deficits: Secondary | ICD-10-CM | POA: Diagnosis not present

## 2023-10-21 DIAGNOSIS — I639 Cerebral infarction, unspecified: Secondary | ICD-10-CM | POA: Diagnosis not present

## 2023-10-21 DIAGNOSIS — I779 Disorder of arteries and arterioles, unspecified: Secondary | ICD-10-CM | POA: Diagnosis not present

## 2023-10-21 NOTE — Progress Notes (Signed)
 d   Follow-up Visit   Date: 10/21/23    Steve [name] MRN: [id number] DOB: 1951/01/15   Interim History: Steve [name] is a 73 y.o. right-handed Caucasian male with right hemifacial spasm, hyperlipidemia, BPH, GERD, and left semiovale stroke (2016, manifesting with right arm weakness and facial numbness), prostate cancer (2021) returning to the clinic for with left embolic stroke (89/7977).  History of present illness: On April 23rd 2015, he woke up with right arm heaviness/weakness and right sided paresthesias. There was no weakness of the right lower extremity or the face.  Symptoms resolved within about 6 hours.  He has noticed that his signature and hand writing is showing subtle differences.  He stopped taking aspirin  several years ago and restarted aspirin  81mg  when these symptoms started.  MRI brain showed small left centrum semiovale stroke.  There is no evidence of large vessel occlusion. Echocardiogram is normal except moderate right-to-left shunt without atrial septal aneurysm. He had PFO closure on 03/27/18.  He has seen Dr. Maurice in the late 1990s for hemifacial spasm and gets botox injected.  In 2019, he began having left sided tightness of the thigh . Sometimes, he has difficulty raising the left leg, such as with wearing pants.  There is some associated low back pain.  He has seen [facility] in the past and had MRI lumbar spine which showed degenerative changes at L4-5.  UPDATE 09/13/2021:  He was admitted to [facility] in October with acute onset of word-finding difficulty, which he noticed especially when trying to type emails.  He went to the ER where MRI confirmed scattered left hemisphere embolic stroke.  TEE was stable, no shunt.  Zio patch monitor did not detect atrial fibrillation.  MRA did not show any large vessel stenosis/occlusion.  He had self discontinued aspirin , but was planning on starting it that day.  Since discharge, he has completed 3 weeks of  ASA 81mg  + plavix  75mg  and now on aspirin  81mg  daily.  Statin therapy was also changed to crestor  20mg  which has improved his LDL from 104 down to 61.  He has noticed some improvement, but is not back to baseline. He did seek second opinion at [facility] and was seen by Dr. Sheliah who recommended repeat MRA neck with plaque protocol due to concern of possible plaque at the left carotid bifurcation.  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 He was suggested to increase aspirin  to 325mg  and continue crestor .   UPDATE 09/12/2022:  He is here for follow-up visit.  In April, he had one day of dizziness and unsteadiness.  He had MRI brain which did not show acute changes.  He also had repeat MRA neck plaque protocol at [facility] which showed stable intraplaque hemorrhage.  He was suggested to have repeat scan in 6-7 months.  No interval TIA or stroke.   Over the past several months, he has noticed greater difficulty with writing because of right hand tremor.  Tremor mild but it is bothersome.  No family history of tremors.    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 Because of this pain, he is unable to enjoy outdoor activities, such as golf.  UPDATE 03/18/2023:  He is here for 6 month  follow-up visit.  He is doing well and denies any new neurological symptoms.  At his last visit, I started propranolol  20mg  BID for tremor, however, the medication made him tired and wife said he was depressed, so he stopped it.  He continues to have intermittent tremor of the right hand which is bothersome when he is trying to drink coffee or place a golf ball on the tee, but symptoms are not all the time.  His blood pressure is elevated today 147/80 and he tells  me that the last time he saw his cardiology it was elevated too.  He was told to monitor BP for 5 days, but admits that he has not done this.  He is scheduled to have follow-up MRA neck at [facility] next week.  UPDATE 10/21/2023:  He is here for 6 month visit.  He is doing well.  Repeat imaging of the neck at [facility] showed stable left common carotid artery.  Fortunately, he has not had any new neurological symptoms.  His tremor has also become less bothersome and had writing is better than before.  No new complaints.   Medications:  Current Outpatient Medications on File Prior to Visit  Medication Sig Dispense Refill   aspirin  325 MG tablet Take 325 mg by mouth daily. 1 Tablet Daily in The Morning     botulinum toxin Type A (BOTOX) 100 units SOLR injection Inject 100 Units into the muscle every 3 (three) months.     cholecalciferol (VITAMIN D3) 25 MCG (1000 UNIT) tablet Take 1,000 Units by mouth daily.     fluticasone  (FLONASE ) 50 MCG/ACT nasal spray ONE SPRAY IN EACH NOSTRIL TWICE DAILY ASNEEDED -USE CROSSOVER TECHNIQUE DISCUSSED (Patient taking differently: Place 1 spray into both nostrils 2 (two) times daily as needed for allergies or rhinitis.) 16 g 0   Multiple Vitamins-Minerals (PRESERVISION AREDS PO) Take 1 capsule by mouth 2 (two) times daily.     pantoprazole  (PROTONIX ) 40 MG tablet TAKE ONE TABLET BY MOUTH EVERY DAY 30 tablet 2   Polyethyl Glycol-Propyl Glycol (SYSTANE) 0.4-0.3 % GEL ophthalmic gel Place 1 application into both eyes at bedtime.     rosuvastatin  (CRESTOR ) 20 MG tablet Take 1 tablet (20 mg total) by mouth daily. 90 tablet 3   silodosin (RAPAFLO) 8 MG CAPS capsule Take 8 mg by mouth daily with breakfast.     SOLIFENACIN SUCCINATE PO Take 10 mg by mouth daily.     vardenafil (LEVITRA) 20 MG tablet Take 20 mg by mouth daily as needed for erectile dysfunction.     vitamin B-12 (CYANOCOBALAMIN ) 1000 MCG tablet Take 1,000 mcg by mouth daily.     vitamin C (ASCORBIC ACID)  500 MG tablet Take 500 mg by mouth daily.     Current Facility-Administered Medications on File Prior to Visit  Medication Dose Route Frequency Provider Last Rate Last Admin   lidocaine -EPINEPHrine  (XYLOCAINE  W/EPI) 2 %-1:100000 (with pres) injection 10 mL  10 mL Intradermal Once Croitoru, Mihai, MD        Allergies:  Allergies  Allergen Reactions   Bee Pollen Itching    Eyes itch and water    Pollen Extract Itching    Eyes itch and water      Vital Signs:  BP (!) 148/87 (BP Location: Right Arm, Patient Position: Sitting)   Pulse 70   Ht 6' 3 (1.905 m)   Wt 206 lb (93.4 kg)   SpO2 98%   BMI 25.75 kg/m   Neurological Exam: MENTAL  STATUS including orientation to time, place, person, recent and remote memory, attention span and concentration, language, and fund of knowledge is normal.  No dysarthria.   CRANIAL NERVES:   Normal conjugate, extra-ocular eye movements in all directions of gaze. Right facial asymmetry involving the forehead and cheek, asymmetrical smile on the right (due to botox administration for hemifacial spasm).  Mild right ptosis.  No right hemifacial spasm seen today.  MOTOR:  Motor strength is 5/5 in all extremities. Fine tremor of the hands when outstretched, trace on exam today.  No rest tremor.    No pronator drift.  Tone is normal.    SENSORY: Intact vibration throughout  REFLEX: Reflexes are 2+/4 throughout  COORDINATION/GAIT:  Normal finger-to- nose-finger.  Intact rapid alternating movements bilaterally.  Gait narrow based and stable.  Data: MRI/A brain and neck 03/18/2015: 1. Mild white matter signal changes in the left posterior centrum semiovale with subtle diffusion signal changes suggestive of subacute white matter infarct. No mass effect or hemorrhage. 2. Otherwise normal for age noncontrast MRI appearance of the brain. 3. Left greater than right carotid bifurcation atherosclerosis with no hemodynamically significant stenosis in the neck. 4.   Negative intracranial MRA.  Lab Results  Component Value Date   CHOL 149 10/15/2023   HDL 48.80 10/15/2023   LDLCALC 73 10/15/2023   LDLDIRECT 172.0 07/24/2007   TRIG 133.0 10/15/2023   CHOLHDL 3 10/15/2023   Lab Results  Component Value Date   HGBA1C 6.2 10/15/2023   Lab Results  Component Value Date   TSH 2.27 09/12/2022    Echocardiogram 03/14/2015: - Left ventricle: The cavity size was normal. Systolic function was   normal. The estimated ejection fraction was in the range of 55%  to 60%. Wall motion was normal; there were no regional wall motion abnormalities. Left ventricular diastolic function   parameters were normal. - Atrial septum: There was a moderate right-to-left atrial level shunt, in the baseline state. There was redundancy of the septum,   with borderline criteria for aneurysm.  TEE 01/13/2018: - Left ventricle: The cavity size was normal. Wall thickness was normal. Systolic function was normal. The estimated ejection   fraction was in the range of 55% to 60%. Wall motion was normal; there were no regional wall motion abnormalities.  - Left atrium: No evidence of thrombus in the atrial cavity or appendage. - Right atrium: No evidence of thrombus in the atrial cavity or appendage. - Atrial septum: There was a small secundum atrial septal defect. Doppler showed a small bidirectional atrial level shunt, in the   baseline state. 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 Acute infarcts in the left frontal and parietal lobes, with the largest area involving the left posteroinferior frontal cortex. Multiple punctate infarcts in multiple vascular territories  suggest embolic etiology. 2. No intracranial large vessel occlusion. 3. No hemodynamically significant stenosis in the neck.  MRI brain wo contrast 01/25/2022: 1. No recent insult or specific cause for symptoms. 2. Remote left cerebral infarcts.   IMPRESSION/PLAN: 1.  Left hemisphere embolic stroke (89/7977) manifesting with expressive aphasia.  Etiology - LICA intraplaque hemorrhage as seen on dedicated plaque protocol MRA at the [facility] in the setting  of being off antiplatelet therapy. Repeat imaging has shown stable plaque. Clinically, he is doing very well.  He completed dual antiplatelet therapy and has been on monotherapy with aspirin .  - Continue aspirin  325mg  daily  - Continue crestor  20mg  daily (LDL 65)  - He will be having repeat imaging at [facility] in the summer  2. History of small left posterior centrum semiovale stroke manifesting with transient R arm weakness and hemisensory loss in April 2016. 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  He is s/p PFO closure in 2019.  No cardiac arrhythmia on loop recorder.  3.  Essential tremor of the right hand, minimal on exam today.  He previously tried propranolol  which was discontinue due to depressed mood  4.  Right hemifacial spasm, followed by Dr. Tandy Riding in [place], [place]  Return to clinic in 6 months  Total time spent reviewing records, interview, history/exam, documentation, and coordination of care on day of encounter:  20 min    Thank you for allowing me to participate in patient's care.  If I can answer any additional questions, I would be pleased to do so.    Sincerely,    Georgianne Gritz K. Tobie, DO

## 2023-10-30 ENCOUNTER — Other Ambulatory Visit (HOSPITAL_BASED_OUTPATIENT_CLINIC_OR_DEPARTMENT_OTHER): Payer: Commercial Managed Care - PPO

## 2023-11-28 ENCOUNTER — Encounter: Payer: Self-pay | Admitting: Internal Medicine

## 2023-11-28 ENCOUNTER — Ambulatory Visit (HOSPITAL_BASED_OUTPATIENT_CLINIC_OR_DEPARTMENT_OTHER)
Admission: RE | Admit: 2023-11-28 | Discharge: 2023-11-28 | Disposition: A | Discharge status: Home / Self Care | Payer: Self-pay | Source: Ambulatory Visit | Attending: Internal Medicine | Admitting: Internal Medicine

## 2023-11-28 DIAGNOSIS — Z136 Encounter for screening for cardiovascular disorders: Secondary | ICD-10-CM | POA: Insufficient documentation

## 2023-12-31 ENCOUNTER — Other Ambulatory Visit: Payer: Self-pay | Admitting: Internal Medicine

## 2024-01-10 ENCOUNTER — Ambulatory Visit: Payer: Commercial Managed Care - PPO | Admitting: Cardiovascular Disease

## 2024-04-06 ENCOUNTER — Encounter (HOSPITAL_BASED_OUTPATIENT_CLINIC_OR_DEPARTMENT_OTHER): Payer: Self-pay

## 2024-04-08 ENCOUNTER — Ambulatory Visit: Attending: Cardiology | Admitting: Cardiovascular Disease

## 2024-04-08 ENCOUNTER — Encounter: Payer: Self-pay | Admitting: Cardiovascular Disease

## 2024-04-08 VITALS — BP 108/64 | HR 89 | Ht 75.0 in | Wt 201.0 lb

## 2024-04-08 DIAGNOSIS — Z8774 Personal history of (corrected) congenital malformations of heart and circulatory system: Secondary | ICD-10-CM | POA: Diagnosis present

## 2024-04-08 DIAGNOSIS — E7800 Pure hypercholesterolemia, unspecified: Secondary | ICD-10-CM | POA: Insufficient documentation

## 2024-04-08 DIAGNOSIS — I6522 Occlusion and stenosis of left carotid artery: Secondary | ICD-10-CM | POA: Insufficient documentation

## 2024-04-08 DIAGNOSIS — Z8673 Personal history of transient ischemic attack (TIA), and cerebral infarction without residual deficits: Secondary | ICD-10-CM | POA: Diagnosis present

## 2024-04-08 DIAGNOSIS — R7303 Prediabetes: Secondary | ICD-10-CM | POA: Insufficient documentation

## 2024-04-08 MED ORDER — EZETIMIBE 10 MG PO TABS: 10.0000 mg | ORAL_TABLET | Freq: Every day | ORAL | 3 refills | Status: DC

## 2024-04-08 NOTE — Patient Instructions (Signed)
 Medication Instructions:  Ezetemibe (Zetia) 10 mg daily *If you need a refill on your cardiac medications before your next appointment, please call your pharmacy*  Lab Work: Lipid panel- have drawn with you PCP in 3 months If you have labs (blood work) drawn today and your tests are completely normal, you will receive your results only by: MyChart Message (if you have MyChart) OR A paper copy in the mail If you have any lab test that is abnormal or we need to change your treatment, we will call you to review the results.  Testing/Procedures: None ordered  Follow-Up: At [facility], you and your health needs are our priority.  As part of our continuing mission to provide you with exceptional heart care, our providers are all part of one team.  This team includes your primary Cardiologist (physician) and Advanced Practice Providers or APPs (Physician Assistants and Nurse Practitioners) who all work together to provide you with the care you need, when you need it.  Your next appointment:   1 year(s)  Provider:   Jerel Balding, MD    We recommend signing up for the patient portal called MyChart.  Sign up information is provided on this After Visit Summary.  MyChart is used to connect with patients for Virtual Visits (Telemedicine).  Patients are able to view lab/test results, encounter notes, upcoming appointments, etc.  Non-urgent messages can be sent to your provider as well.   To learn more about what you can do with MyChart, go to [email or web address].

## 2024-04-08 NOTE — Progress Notes (Unsigned)
 Patient ID: Steve [name], male   DOB: 1950/10/28, 73 y.o.   MRN: [id number]      Cardiology Office Note    Date:  04/09/2024   ID:  DAIMON KEAN, DOB 11/24/1950, MRN [id number]  PCP:  Geofm Glade PARAS, MD  Cardiologist:   Jerel Balding, MD   No chief complaint on file.   History of Present Illness:  Steve [name] is a 73 y.o. male who presents in follow-up after closure (Amplatzer 25 mm, Dr. Wonda,  in June 2019) of a large PFO with atrial septal aneurysm felt to be the cause of his previous ischemic stroke that occurred in 2016.  Implantable loop recorder monitoring has not shown evidence of atrial fibrillation (ILR extracted at end of service).  He had a new left MCA ischemic event in October 2022 (mildly aphasic, problem resolved).  Extensive evaluation including a visit to the [facility] suggested that his stroke was due to a unstable plaque in his left carotid artery, with a mild degree of stenosis.  He has not had any new neurological complaints.  He was taking propranolol  for a fine hand tremor, but he was feeling depressed and tired and has stopped the medication with improvement in the symptoms.  He is going back to the [facility] later this month for his follow-up, but an interim ultrasound scan shows some regression in the severity of the carotid plaque.  He continues to exercise almost every day of the week either by riding the elliptical or going to the gym to lift some weights.  He also walks in his neighborhood and does yoga and Pilates.  The patient specifically denies any chest pain at rest or with exertion, dyspnea at rest or with exertion, orthopnea, paroxysmal nocturnal dyspnea, syncope, palpitations, focal neurological deficits, intermittent claudication, lower extremity edema, unexplained weight gain, cough, hemoptysis or wheezing.   A recent coronary calcium  score showed average atherosclerotic plaque with calcium  score placing him in the 57th  percentile.  Metabolic control is fair with a hemoglobin A1c of 6.2%, LDL 73 and HDL 49, although he does not quite meet target LDL less than 70.  He is taking rosuvastatin  20 mg daily.  His blood pressure is slightly high for him today but this is atypical.  In the past his blood pressure has usually been in the 120s/70s.  Past Medical History:  Diagnosis Date   Arthritis    Right shoulder   Diverticulosis 2013   Moderate    First degree AV block 12/09/2018   noted on EKG   GERD (gastroesophageal reflux disease)    Grade I diastolic dysfunction 09/25/2018   Noted on ECHO    Hemifacial spasm    History of loop recorder    Hx of colonic polyp 2008 & 2013   X2   Hyperlipidemia    Internal hemorrhoids 2013   PFO (patent foramen ovale)    History of    Prostate cancer (HCC)    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   Sleep apnea    Resolved   Squamous cell skin cancer    Dr.King [place]   Stricture esophagus 2013    dilation X 1   Stroke (HCC) 2016   left semiovale stroke    TIA (transient ischemic attack) 01/2015   Ulcerative esophagitis 2013    Past Surgical History:  Procedure Laterality Date   BUBBLE STUDY  08/11/2021   Procedure: BUBBLE STUDY;  Surgeon: Raford Riggs, MD;  Location: [facility];  Service: Cardiovascular;;   COLONOSCOPY W/ POLYPECTOMY  2013, 2018 last done   tubular adenoma   COLONOSCOPY W/ POLYPECTOMY  2008   hyperplastic   CYSTOSCOPY N/A 09/19/2020   Procedure: CYSTOSCOPY FLEXIBLE;  Surgeon: Matilda Senior, MD;  Location: [facility];  Service: Urology;  Laterality: N/A;   EP IMPLANTABLE DEVICE N/A 01/03/2016   Procedure: Loop Recorder Insertion;  Surgeon: Jerel Balding, MD;  Location: [facility] INVASIVE CV LAB;  Service: Cardiovascular;  Laterality: N/A;   MOHS SURGERY     x 3   NASAL SINUS SURGERY  15 yrs ago   PATENT FORAMEN OVALE(PFO) CLOSURE N/A 03/27/2018   Procedure: PATENT FORAMEN OVALE (PFO) CLOSURE;  Surgeon:  Wonda Sharper, MD;  Location: [facility] INVASIVE CV LAB;  Service: Cardiovascular;  Laterality: N/A;   PROSTATE BIOPSY  2021   RADIOACTIVE SEED IMPLANT N/A 09/19/2020   Procedure: RADIOACTIVE SEED IMPLANT/BRACHYTHERAPY IMPLANT;  Surgeon: Matilda Senior, MD;  Location: [facility];  Service: Urology;  Laterality: N/A;  90 MINS   REVERSE SHOULDER ARTHROPLASTY Right 03/10/2019   Procedure: REVERSE SHOULDER ARTHROPLASTY;  Surgeon: Josefina Chew, MD;  Location: [facility];  Service: Orthopedics;  Laterality: Right;   ROTATOR CUFF REPAIR Left yrs ago   SPACE OAR INSTILLATION N/A 09/19/2020   Procedure: SPACE OAR INSTILLATION;  Surgeon: Matilda Senior, MD;  Location[facility];  Service: Urology;  Laterality: N/A;   TEE WITHOUT CARDIOVERSION N/A 01/06/2018   Procedure: TRANSESOPHAGEAL ECHOCARDIOGRAM (TEE);  Surgeon: Balding Jerel, MD;  Location: [facility] ENDOSCOPY;  Service: Cardiovascular;  Laterality: N/A;   TEE WITHOUT CARDIOVERSION N/A 08/11/2021   Procedure: TRANSESOPHAGEAL ECHOCARDIOGRAM (TEE);  Surgeon: Raford Riggs, MD;  Location: [facility] ENDOSCOPY;  Service: Cardiovascular;  Laterality: N/A;   torn biceps tendon Left yrs ago   surgical repair   uvulectomy & tonsillectomy  2008   Dr Mable    Outpatient Medications Prior to Visit  Medication Sig Dispense Refill   aspirin  325 MG tablet Take 325 mg by mouth daily. 1 Tablet Daily in The Morning     botulinum toxin Type A (BOTOX) 100 units SOLR injection Inject 100 Units into the muscle every 3 (three) months.     cholecalciferol (VITAMIN D3) 25 MCG (1000 UNIT) tablet Take 1,000 Units by mouth daily.     fluticasone  (FLONASE ) 50 MCG/ACT nasal spray ONE SPRAY IN EACH NOSTRIL TWICE DAILY ASNEEDED -USE CROSSOVER TECHNIQUE DISCUSSED (Patient taking differently: Place 1 spray into both nostrils 2 (two) times daily as needed for allergies or rhinitis.) 16 g 0   Multiple Vitamins-Minerals (PRESERVISION AREDS PO) Take 1  capsule by mouth 2 (two) times daily.     pantoprazole  (PROTONIX ) 40 MG tablet TAKE ONE TABLET BY MOUTH EVERY DAY 90 tablet 3   Polyethyl Glycol-Propyl Glycol (SYSTANE) 0.4-0.3 % GEL ophthalmic gel Place 1 application into both eyes at bedtime.     propranolol  (INDERAL ) 20 MG tablet Take 20 mg by mouth 2 (two) times daily.     rosuvastatin  (CRESTOR ) 20 MG tablet Take 1 tablet (20 mg total) by mouth daily. 90 tablet 3   sildenafil (REVATIO) 20 MG tablet Take 40 mg by mouth as needed.     silodosin (RAPAFLO) 8 MG CAPS capsule Take 8 mg by mouth daily with breakfast.     SOLIFENACIN SUCCINATE PO Take 10 mg by mouth daily.     vardenafil (LEVITRA) 20 MG tablet Take 20 mg by mouth daily as needed for erectile dysfunction.  vitamin B-12 (CYANOCOBALAMIN ) 1000 MCG tablet Take 1,000 mcg by mouth daily.     vitamin C (ASCORBIC ACID) 500 MG tablet Take 500 mg by mouth daily.     Facility-Administered Medications Prior to Visit  Medication Dose Route Frequency Provider Last Rate Last Admin   lidocaine -EPINEPHrine  (XYLOCAINE  W/EPI) 2 %-1:100000 (with pres) injection 10 mL  10 mL Intradermal Once Nicodemus Denk, MD         Allergies:   Bee pollen and Pollen extract   Social History   Socioeconomic History   Marital status: Married    Spouse name: Not on file   Number of children: Not on file   Years of education: Not on file   Highest education level: Bachelor's degree (e.g., BA, AB, BS)  Occupational History   Occupation: PRESIDENT OF [employer]  Tobacco Use   Smoking status: Former    Current packs/day: 0.00    Average packs/day: 0.5 packs/day for 10.0 years (5.0 ttl pk-yrs)    Types: Cigarettes    Start date: 67    Quit date: 1983    Years since quitting: 42.5   Smokeless tobacco: Never   Tobacco comments:    smoked 1972-1982, up to 1/2 ppd  Vaping Use   Vaping status: Never Used  Substance and Sexual Activity   Alcohol use: Yes    Alcohol/week: 5.0 - 6.0 standard drinks  of alcohol    Types: 5 - 6 Standard drinks or equivalent per week    Comment:  2 -3 drinks per week   Drug use: No   Sexual activity: Not Currently  Other Topics Concern   Not on file  Social History Narrative   GETS REG EXERCISE 4x weekly   NO DIET   Lives with wife in a 2 story home.  Has 2 children.     Works as a Special educational needs teacher.     Education: college   Right Handed   Drinks Caffeine    Lives in a two story home   Social Drivers of Health   Financial Resource Strain: Low Risk  (09/23/2023)   Overall Financial Resource Strain (CARDIA)    Difficulty of Paying Living Expenses: Not hard at all  Food Insecurity: No Food Insecurity (09/23/2023)   Hunger Vital Sign    Worried About Running Out of Food in the Last Year: Never true    Ran Out of Food in the Last Year: Never true  Transportation Needs: No Transportation Needs (09/23/2023)   PRAPARE - Administrator, Civil Service (Medical): No    Lack of Transportation (Non-Medical): No  Physical Activity: Sufficiently Active (09/23/2023)   Exercise Vital Sign    Days of Exercise per Week: 4 days    Minutes of Exercise per Session: 80 min  Stress: No Stress Concern Present (09/23/2023)   [employer] of Occupational Health - Occupational Stress Questionnaire    Feeling of Stress : Not at all  Social Connections: Socially Integrated (09/23/2023)   Social Connection and Isolation Panel    Frequency of Communication with Friends and Family: More than three times a week    Frequency of Social Gatherings with Friends and Family: Once a week    Attends Religious Services: More than 4 times per year    Active Member of [employer] or Organizations: Yes    Attends Engineer, structural: More than 4 times per year    Marital Status: Married     Family History:  The  patient's family history includes Breast cancer in his mother; Colon cancer in his mother; Colon polyps in his mother; Diabetes in his daughter; Healthy  in his brother; Heart attack in his maternal grandfather; Hyperlipidemia in his son; Prostate cancer in his father; Stroke in his father and paternal uncle; Uterine cancer in his mother.   ROS:   Please see the history of present illness.    ROS All other systems are reviewed and are negative   PHYSICAL EXAM:   VS:  BP 108/64 (BP Location: Left Arm, Patient Position: Sitting, Cuff Size: Normal)   Pulse 89   Ht 6' 3 (1.905 m)   Wt 201 lb (91.2 kg)   SpO2 97%   BMI 25.12 kg/m      General: Alert, oriented x3, no distress, appears fit, but does have abdominal adiposity. Head: no evidence of trauma, PERRL, EOMI, no exophtalmos or lid lag, no myxedema, no xanthelasma; normal ears, nose and oropharynx Neck: normal jugular venous pulsations and no hepatojugular reflux; brisk carotid pulses without delay and no carotid bruits Chest: clear to auscultation, no signs of consolidation by percussion or palpation, normal fremitus, symmetrical and full respiratory excursions Cardiovascular: normal position and quality of the apical impulse, regular rhythm, normal first and second heart sounds, no murmurs, rubs or gallops Abdomen: no tenderness or distention, no masses by palpation, no abnormal pulsatility or arterial bruits, normal bowel sounds, no hepatosplenomegaly Extremities: no clubbing, cyanosis or edema; 2+ radial, ulnar and brachial pulses bilaterally; 2+ right femoral, posterior tibial and dorsalis pedis pulses; 2+ left femoral, posterior tibial and dorsalis pedis pulses; no subclavian or femoral bruits Neurological: Right palpable spasm, otherwise nonfocal.  Hand tremor is barely noticeable. Psych: Normal mood and affect    Wt Readings from Last 3 Encounters:  04/08/24 201 lb (91.2 kg)  10/21/23 206 lb (93.4 kg)  09/25/23 201 lb (91.2 kg)      Studies/Labs Reviewed:   TEE 08/11/2021:  1. Left ventricular ejection fraction, by estimation, is 60 to 65%. The  left ventricle has  normal function. The left ventricle has no regional  wall motion abnormalities.   2. Right ventricular systolic function is normal. The right ventricular  size is normal.   3. No left atrial/left atrial appendage thrombus was detected.   4. There is an Amplatzer closure device in place with no evidence of  atrial shunting or thrombus. Agitated saline contrast bubble study was  negative, with no evidence of any interatrial shunt.   5. The mitral valve is normal in structure. Trivial mitral valve  regurgitation. No evidence of mitral stenosis.   6. The aortic valve is tricuspid. Aortic valve regurgitation is not  visualized. No aortic stenosis is present.   7. The inferior vena cava is normal in size with greater than 50%  respiratory variability, suggesting right atrial pressure of 3 mmHg.   Conclusion(s)/Recommendation(s): Normal biventricular function without  evidence of hemodynamically significant valvular heart disease.   EKG:    EKG Interpretation Date/Time:  Wednesday April 08 2024 09:08:40 EDT Ventricular Rate:  89 PR Interval:  232 QRS Duration:  92 QT Interval:  378 QTC Calculation: 459 R Axis:   -39  Text Interpretation: Sinus rhythm with 1st degree A-V block Left axis deviation When compared with ECG of 27-Jul-2021 20:37, No significant change since last tracing Confirmed by Toryn Mcclinton ([id number]) on 04/08/2024 9:20:54 AM        Recent Labs: 09/18/2023: Hemoglobin 15.5; Platelets 201.0 10/15/2023: ALT 33; BUN  17; Creatinine, Ser 1.09; Potassium 4.8; Sodium 137   Lipid Panel    Component Value Date/Time   CHOL 149 10/15/2023 0816   CHOL 183 02/07/2015 1535   TRIG 133.0 10/15/2023 0816   TRIG 81 02/07/2015 1535   HDL 48.80 10/15/2023 0816   HDL 63 02/07/2015 1535   CHOLHDL 3 10/15/2023 0816   VLDL 26.6 10/15/2023 0816   LDLCALC 73 10/15/2023 0816   LDLCALC 104 (H) 02/07/2015 1535   LDLDIRECT 172.0 07/24/2007 0000    ASSESSMENT:    1. Chronic left arterial  ischemic stroke, ICA (internal carotid artery)   2. Carotid stenosis, left   3. History of surgical closure of patent foramen ovale (PFO)   4. Hypercholesterolemia   5. Prediabetes       PLAN:  In order of problems listed above:   Common carotid artery stenosis: Despite not having severe stenosis, it was felt that his stroke was likely due to an ulcerated plaque, which has since shown some signs of healing/regression. PFO s/p closure 2019: Asymptomatic.  The closure remains effective.  MRI safe device.  Does not require endocarditis prophylaxis anymore. HLP: LDL is above target at 73 even though he is taking rosuvastatin  20 mg daily and maintains a healthy lifestyle.  Will add ezetimibe 10 mg daily, rather than increasing rosuvastatin  dose, which might worsen his glucose intolerance. Prediabetes: Fair hemoglobin A1c at 6.2%.  His BMI is right at 25.  He expresses a desire to lose about 10 pounds.  He exercises regularly.     Medication Adjustments/Labs and Tests Ordered: Current medicines are reviewed at length with the patient today.  Concerns regarding medicines are outlined above.  Medication changes, Labs and Tests ordered today are listed in the Patient Instructions below. Patient Instructions  Medication Instructions:  Ezetemibe (Zetia) 10 mg daily *If you need a refill on your cardiac medications before your next appointment, please call your pharmacy*  Lab Work: Lipid panel- have drawn with you PCP in 3 months If you have labs (blood work) drawn today and your tests are completely normal, you will receive your results only by: MyChart Message (if you have MyChart) OR A paper copy in the mail If you have any lab test that is abnormal or we need to change your treatment, we will call you to review the results.  Testing/Procedures: None ordered  Follow-Up: At [facility], you and your health needs are our priority.  As part of our continuing mission to provide  you with exceptional heart care, our providers are all part of one team.  This team includes your primary Cardiologist (physician) and Advanced Practice Providers or APPs (Physician Assistants and Nurse Practitioners) who all work together to provide you with the care you need, when you need it.  Your next appointment:   1 year(s)  Provider:   Jerel Balding, MD    We recommend signing up for the patient portal called MyChart.  Sign up information is provided on this After Visit Summary.  MyChart is used to connect with patients for Virtual Visits (Telemedicine).  Patients are able to view lab/test results, encounter notes, upcoming appointments, etc.  Non-urgent messages can be sent to your provider as well.   To learn more about what you can do with MyChart, go to [email or web address].             Signed, Jerel Balding, MD  04/09/2024 2:58 PM    [facility] [address] [place],  [place], [place]  [address] Phone: [phone]; Fax: [phone]

## 2024-04-28 ENCOUNTER — Ambulatory Visit (INDEPENDENT_AMBULATORY_CARE_PROVIDER_SITE_OTHER): Payer: Commercial Managed Care - PPO | Admitting: Neurology

## 2024-04-28 ENCOUNTER — Encounter: Payer: Self-pay | Admitting: Neurology

## 2024-04-28 VITALS — BP 148/78 | HR 65 | Ht 75.0 in | Wt 203.0 lb

## 2024-04-28 DIAGNOSIS — Z8673 Personal history of transient ischemic attack (TIA), and cerebral infarction without residual deficits: Secondary | ICD-10-CM | POA: Diagnosis not present

## 2024-04-28 DIAGNOSIS — I779 Disorder of arteries and arterioles, unspecified: Secondary | ICD-10-CM

## 2024-04-28 DIAGNOSIS — R251 Tremor, unspecified: Secondary | ICD-10-CM

## 2024-04-28 NOTE — Progress Notes (Signed)
 Follow-up Visit   Date: 04/28/24    Steve [name] MRN: [id number] DOB: 18-Aug-1951   Interim History: Steve [name] is a 73 y.o. 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  History of present illness: On April 23rd 2015, he woke up with right arm heaviness/weakness and right sided paresthesias. There was no weakness of the right lower extremity or the face.  Symptoms resolved within about 6 hours.  He has noticed that his signature and hand writing is showing subtle differences.  He stopped taking aspirin  several years ago and restarted aspirin  81mg  when these symptoms started.  MRI brain showed small left centrum semiovale stroke.  There is no evidence of large vessel occlusion. Echocardiogram is normal except moderate right-to-left shunt without atrial septal aneurysm. He had PFO closure on 03/27/18.  He has seen Dr. Maurice in the late 1990s for hemifacial spasm and gets botox injected.  In 2019, he began having left sided tightness of the thigh . Sometimes, he has difficulty raising the left leg, such as with wearing pants.  There is some associated low back pain.  He has seen [facility] in the past and had MRI lumbar spine which showed degenerative changes at L4-5.  UPDATE 09/13/2021:  He was admitted to [facility] in October with acute onset of word-finding difficulty, which he noticed especially when trying to type emails.  He went to the ER where MRI confirmed scattered left hemisphere embolic stroke.  TEE was stable, no shunt.  Zio patch monitor did not detect atrial fibrillation.  MRA did not show any large vessel stenosis/occlusion.  He had self discontinued aspirin , but was planning on starting it that day.  Since discharge, he has completed 3 weeks of ASA 81mg  +  plavix  75mg  and now on aspirin  81mg  daily.  Statin therapy was also changed to crestor  20mg  which has improved his LDL from 104 down to 61.  He has noticed some improvement, but is not back to baseline. He did seek second opinion at [facility] and was seen by Dr. Sheliah who recommended repeat MRA neck with plaque protocol due to concern of possible plaque at the left carotid bifurcation.  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 He was suggested to increase aspirin  to 325mg  and continue crestor .   UPDATE 09/12/2022:  He is here for follow-up visit.  In April, he had one day of dizziness and unsteadiness.  He had MRI brain which did not show acute changes.  He also had repeat MRA neck plaque protocol at [facility] which showed stable intraplaque hemorrhage.  He was suggested to have repeat scan in 6-7 months.  No interval TIA or stroke.   Over the past several months, he has noticed greater difficulty with writing because of right hand tremor.  Tremor mild but it is bothersome.  No family history of tremors.    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 Because of this pain, he is unable to enjoy outdoor activities, such as golf.  UPDATE 03/18/2023:  He is here for 6 month follow-up visit.  He is doing well and denies any new neurological symptoms.  At his last visit, I started propranolol  20mg  BID for tremor, however, the medication made him tired and wife said he was depressed, so he stopped it.  He continues to have intermittent tremor of the right hand which is bothersome when he is trying to drink coffee or place a golf ball on the tee, but symptoms are not all the time.  His blood pressure is elevated today 147/80 and he tells me that the  last time he saw his cardiology it was elevated too.  He was told to monitor BP for 5 days, but admits that he has not done this.  He is scheduled to have follow-up MRA neck at [facility] next week.  UPDATE 10/21/2023:  He is here for 6 month visit.  He is doing well.  Repeat imaging of the neck at [facility] showed stable left common carotid artery.  Fortunately, he has not had any new neurological symptoms.  His tremor has also become less bothersome and had writing is better than before.  No new complaints.   UPDATE 04/28/2024:  He is here for 6 month visit.  No new complaints today.  He is scheduled to have repeat MRA neck tomorrow and has follow-up with Dr. Sheliah on Thursday.  He is doing well.  He continues to have right hand tremor which mostly bothers him when writing.  He stopped propranolol  because of worsening mood.   Medications:  Current Outpatient Medications on File Prior to Visit  Medication Sig Dispense Refill   aspirin  325 MG tablet Take 325 mg by mouth daily. 1 Tablet Daily in The Morning     botulinum toxin Type A (BOTOX) 100 units SOLR injection Inject 100 Units into the muscle every 3 (three) months.     cholecalciferol (VITAMIN D3) 25 MCG (1000 UNIT) tablet Take 1,000 Units by mouth daily.     ezetimibe  (ZETIA ) 10 MG tablet Take 1 tablet (10 mg total) by mouth daily. 90 tablet 3   fluticasone  (FLONASE ) 50 MCG/ACT nasal spray ONE SPRAY IN EACH NOSTRIL TWICE DAILY ASNEEDED -USE CROSSOVER TECHNIQUE DISCUSSED 16 g 0   Multiple Vitamins-Minerals (PRESERVISION AREDS PO) Take 1 capsule by mouth 2 (two) times daily.     pantoprazole  (PROTONIX ) 40 MG tablet TAKE ONE TABLET BY MOUTH EVERY DAY 90 tablet 3   Polyethyl Glycol-Propyl Glycol (SYSTANE) 0.4-0.3 % GEL ophthalmic gel Place 1 application into both eyes at bedtime.     rosuvastatin  (CRESTOR ) 20 MG tablet Take 1 tablet (20 mg total) by mouth daily. 90 tablet 3   sildenafil (REVATIO) 20 MG tablet Take 40 mg by mouth as needed.  (Patient taking differently: Take 40 mg by mouth as needed. Taking 10 mg)     silodosin (RAPAFLO) 8 MG CAPS capsule Take 8 mg by mouth daily with breakfast.     SOLIFENACIN SUCCINATE PO Take 10 mg by mouth daily.     vardenafil (LEVITRA) 20 MG tablet Take 20 mg by mouth daily as needed for erectile dysfunction.     vitamin B-12 (CYANOCOBALAMIN ) 1000 MCG tablet Take 1,000 mcg by mouth daily.     vitamin C (ASCORBIC ACID) 500 MG tablet Take 500 mg by mouth daily.     Current Facility-Administered Medications on File Prior to Visit  Medication Dose Route Frequency Provider Last Rate Last Admin   lidocaine -EPINEPHrine  (XYLOCAINE  W/EPI) 2 %-1:100000 (with pres) injection 10 mL  10 mL Intradermal Once Croitoru,  Jerel, MD        Allergies:  Allergies  Allergen Reactions   Bee Pollen Itching    Eyes itch and water    Pollen Extract Itching    Eyes itch and water      Vital Signs:  BP (!) 148/78   Pulse 65   Ht 6' 3 (1.905 m)   Wt 203 lb (92.1 kg)   SpO2 96%   BMI 25.37 kg/m   Neurological Exam: MENTAL STATUS including orientation to time, place, person, recent and remote memory, attention span and concentration, language, and fund of knowledge is normal.  No dysarthria.   CRANIAL NERVES:   Normal conjugate, extra-ocular eye movements in all directions of gaze. Right facial asymmetry involving the forehead and cheek, asymmetrical smile on the right (due to botox administration for hemifacial spasm).  Moderate right ptosis.  Intermittent right hemifacial spasm .  MOTOR:  Motor strength is 5/5 in all extremities. Fine tremor of the right hand when outstretched.  No rest tremor.    No pronator drift.  Tone is normal.    SENSORY: Intact vibration throughout  REFLEX: Reflexes are 2+/4 throughout  COORDINATION/GAIT:  Normal finger-to- nose-finger.  Intact rapid alternating movements bilaterally.  Gait narrow based and stable.  Data: MRI/A brain and neck 03/18/2015: 1. Mild white matter  signal changes in the left posterior centrum semiovale with subtle diffusion signal changes suggestive of subacute white matter infarct. No mass effect or hemorrhage. 2. Otherwise normal for age noncontrast MRI appearance of the brain. 3. Left greater than right carotid bifurcation atherosclerosis with no hemodynamically significant stenosis in the neck. 4.  Negative intracranial MRA.  Lab Results  Component Value Date   CHOL 149 10/15/2023   HDL 48.80 10/15/2023   LDLCALC 73 10/15/2023   LDLDIRECT 172.0 07/24/2007   TRIG 133.0 10/15/2023   CHOLHDL 3 10/15/2023   Lab Results  Component Value Date   HGBA1C 6.2 10/15/2023   Lab Results  Component Value Date   TSH 2.27 09/12/2022    Echocardiogram 03/14/2015: - Left ventricle: The cavity size was normal. Systolic function was   normal. The estimated ejection fraction was in the range of 55%  to 60%. Wall motion was normal; there were no regional wall motion abnormalities. Left ventricular diastolic function   parameters were normal. - Atrial septum: There was a moderate right-to-left atrial level shunt, in the baseline state. There was redundancy of the septum,   with borderline criteria for aneurysm.  TEE 01/13/2018: - Left ventricle: The cavity size was normal. Wall thickness was normal. Systolic function was normal. The estimated ejection   fraction was in the range of 55% to 60%. Wall motion was normal; there were no regional wall motion abnormalities.  - Left atrium: No evidence of thrombus in the atrial cavity or appendage. - Right atrium: No evidence of thrombus in the atrial cavity or appendage. - Atrial septum: There was a small secundum atrial septal defect. Doppler showed a small bidirectional atrial level shunt, in the   baseline state. 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  MRA neck 09/07/2021 [facility]:   Intraplaque hemorrhage within the distal left common carotid artery extending into the proximal LICAnarrowingthe lumen less than 40%  MRI/A head and MRA neck 07/27/2021: 1. Acute infarcts in the left frontal and parietal lobes, with the largest area involving the left posteroinferior frontal cortex. Multiple  punctate infarcts in multiple vascular territories suggest embolic etiology. 2. No intracranial large vessel occlusion. 3. No hemodynamically significant stenosis in the neck.  MRI brain wo contrast 01/25/2022: 1. No recent insult or specific cause for symptoms. 2. Remote left cerebral infarcts.    IMPRESSION/PLAN: 1.  Left hemisphere embolic stroke (89/7977) manifesting with expressive aphasia (no residual deficits).  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 Repeat imaging has shown stable plaque. Clinically, he is doing very well.  - Continue aspirin  325mg  daily  - Continue crestor  20mg  daily (LDL 73) and zetia  10mg   - He is scheduled to have repeat imaging at [facility] tomorrow.    2. History of small left posterior centrum semiovale stroke manifesting with transient R arm weakness and hemisensory loss in April 2016. 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  He is s/p PFO closure in 2019.  No cardiac arrhythmia on loop recorder.  3.  Essential tremor of the right hand, trace.  Previously tried propranolol  (depressed mood).  If symptoms get worse, consider primidone.   4.  Right hemifacial spasm, followed by Dr. Tandy Riding in [place], [place]  Return to clinic in 6 months  Total time spent reviewing records, interview, history/exam, documentation, and coordination of care on day of encounter:  20 minutes    Thank you for allowing me to participate in patient's care.  If I can answer any additional questions, I would be pleased to  do so.    Sincerely,    Maliah Pyles K. Tobie, DO

## 2024-05-25 NOTE — Patient Instructions (Addendum)
    Steve [name] , Thank you for taking time to come for your Medicare Wellness Visit. I appreciate your ongoing commitment to your health goals. Please review the following plan we discussed and let me know if I can assist you in the future.   These are the goals we discussed:  Goals   None     This is a list of the screening recommended for you and due dates:  Health Maintenance  Topic Date Due   Medicare Annual Wellness Visit  Never done   COVID-19 Vaccine (4 - 2024-25 season) 06/09/2023   Flu Shot  05/08/2024   DTaP/Tdap/Td vaccine (3 - Td or Tdap) 09/24/2024*   Colon Cancer Screening  09/08/2027   Pneumococcal Vaccine for age over 30  Completed   Hepatitis C Screening  Completed   Zoster (Shingles) Vaccine  Completed   HPV Vaccine  Aged Out   Meningitis B Vaccine  Aged Out   Pneumococcal Vaccine  Discontinued  *Topic was postponed. The date shown is not the original due date.       Blood work was ordered.       Medications changes include :   None    Return in about 1 year (around 05/26/2025) for follow up.

## 2024-05-25 NOTE — Assessment & Plan Note (Signed)
History of prostate cancer Following with urology No evidence of recurrence 

## 2024-05-25 NOTE — Progress Notes (Unsigned)
 Annual Wellness Visit     Patient: Steve [name], Male    DOB: 04-02-1951, 73 y.o.   MRN: [id number]  Subjective  No chief complaint on file.   Steve [name] is a 73 y.o. male who presents today for his Annual Wellness Visit. He reports consuming a {diet types:17450} diet. {Exercise:19826} He generally feels {well/fairly well/poorly:18703}. He reports sleeping {well/fairly well/poorly:18703}. He {does/does not:200015} have additional problems to discuss today.   HPI  {VISON DENTAL STD PSA (Optional):27386}   {History (Optional):23778}  Medications: Outpatient Medications Prior to Visit  Medication Sig   aspirin  325 MG tablet Take 325 mg by mouth daily. 1 Tablet Daily in The Morning   botulinum toxin Type A (BOTOX) 100 units SOLR injection Inject 100 Units into the muscle every 3 (three) months.   cholecalciferol (VITAMIN D3) 25 MCG (1000 UNIT) tablet Take 1,000 Units by mouth daily.   ezetimibe  (ZETIA ) 10 MG tablet Take 1 tablet (10 mg total) by mouth daily.   fluticasone  (FLONASE ) 50 MCG/ACT nasal spray ONE SPRAY IN EACH NOSTRIL TWICE DAILY ASNEEDED -USE CROSSOVER TECHNIQUE DISCUSSED   Multiple Vitamins-Minerals (PRESERVISION AREDS PO) Take 1 capsule by mouth 2 (two) times daily.   pantoprazole  (PROTONIX ) 40 MG tablet TAKE ONE TABLET BY MOUTH EVERY DAY   Polyethyl Glycol-Propyl Glycol (SYSTANE) 0.4-0.3 % GEL ophthalmic gel Place 1 application into both eyes at bedtime.   rosuvastatin  (CRESTOR ) 20 MG tablet Take 1 tablet (20 mg total) by mouth daily.   sildenafil (REVATIO) 20 MG tablet Take 40 mg by mouth as needed. (Patient taking differently: Take 40 mg by mouth as needed. Taking 10 mg)   silodosin (RAPAFLO) 8 MG CAPS capsule Take 8 mg by mouth daily with breakfast.   SOLIFENACIN SUCCINATE PO Take 10 mg by mouth daily.   vardenafil (LEVITRA) 20 MG tablet Take 20 mg by mouth daily as needed for erectile dysfunction.   vitamin B-12 (CYANOCOBALAMIN ) 1000 MCG tablet Take  1,000 mcg by mouth daily.   vitamin C (ASCORBIC ACID) 500 MG tablet Take 500 mg by mouth daily.   Facility-Administered Medications Prior to Visit  Medication Dose Route Frequency Provider   lidocaine -EPINEPHrine  (XYLOCAINE  W/EPI) 2 %-1:100000 (with pres) injection 10 mL  10 mL Intradermal Once Croitoru, Mihai, MD    Allergies  Allergen Reactions   Bee Pollen Itching    Eyes itch and water    Pollen Extract Itching    Eyes itch and water     Patient Care Team: Geofm Glade PARAS, MD as PCP - General (Internal Medicine) Croitoru, Jerel, MD as PCP - Cardiology (Cardiology) Patel, Donika K, DO as Consulting Physician (Neurology) Sheliah Jakob  MD  Neurology at [facility] Dr Geofm Riding  [facility] Cleopatra Cheree Lorrene Ozell  MD  Ophthalmology  ROS      Objective  There were no vitals taken for this visit. {Vitals History (Optional):23777}  Physical Exam    Most recent functional status assessment:    05/25/2024   12:35 PM  In your present state of health, do you have any difficulty performing the following activities:  Hearing? 0  Vision? 0  Difficulty concentrating or making decisions? 0  Walking or climbing stairs? 0  Dressing or bathing? 0  Doing errands, shopping? 0  Preparing Food and eating ? N  Using the Toilet? N  In the past six months, have you accidently leaked urine? Y  Do you have problems with loss of bowel control? N  Managing your Medications? N  Managing your Finances? N  Housekeeping or managing your Housekeeping? N   Most recent fall risk assessment:    05/25/2024   12:35 PM  Fall Risk   Falls in the past year? 0  Number falls in past yr: 0  Injury with Fall? 0    Most recent depression screenings:    08/21/2022    3:33 PM 07/26/2021    2:30 PM  PHQ 2/9 Scores  PHQ - 2 Score 0 0  PHQ- 9 Score  0   Most recent cognitive screening:     No data to display         Most recent Audit-C alcohol use screening     05/25/2024   12:40 PM  Alcohol Use Disorder Test (AUDIT)  1. How often do you have a drink containing alcohol? 3  2. How many drinks containing alcohol do you have on a typical day when you are drinking? 0  3. How often do you have six or more drinks on one occasion? 0  AUDIT-C Score 3   4. How often during the last year have you found that you were not able to stop drinking once you had started? 0  5. How often during the last year have you failed to do what was normally expected from you because of drinking? 0  6. How often during the last year have you needed a first drink in the morning to get yourself going after a heavy drinking session? 0  7. How often during the last year have you had a feeling of guilt of remorse after drinking? 0  8. How often during the last year have you been unable to remember what happened the night before because you had been drinking? 0  9. Have you or someone else been injured as a result of your drinking? 0  10. Has a relative or friend or a doctor or another health worker been concerned about your drinking or suggested you cut down? 0  Alcohol Use Disorder Identification Test Final Score (AUDIT) 3      Patient-reported   A score of 3 or more in women, and 4 or more in men indicates increased risk for alcohol abuse, EXCEPT if all of the points are from question 1   Vision/Hearing Screen: No results found.  {Labs (Optional):23779}  No results found for any visits on 05/26/24.    Assessment & Plan   Annual wellness visit done today including the all of the following: Reviewed patient's Family Medical History Reviewed and updated list of patient's medical providers Assessment of cognitive impairment was done Assessed patient's functional ability Established a written schedule for health screening services Health Risk Assessent Completed and Reviewed  Exercise Activities and Dietary recommendations  Goals   None     Immunization History   Administered Date(s) Administered   Fluad Quad(high Dose 65+) 07/11/2019, 08/21/2022   Fluad Trivalent(High Dose 65+) 09/25/2023   Influenza Whole 06/07/2010, 07/11/2013   Influenza, High Dose Seasonal PF 09/30/2015   Influenza, Seasonal, Injecte, Preservative Fre 11/04/2012   Influenza,trivalent, recombinat, inj, PF 08/23/2018   Influenza-Unspecified 07/02/2014, 07/25/2016, 07/27/2017, 07/25/2021   PFIZER Comirnaty(Gray Top)Covid-19 Tri-Sucrose Vaccine 02/23/2021   PFIZER(Purple Top)SARS-COV-2 Vaccination 10/29/2019, 11/16/2019   Pneumococcal Conjugate-13 11/09/2015   Pneumococcal Polysaccharide-23 08/06/2017   Td 02/28/2009   Tdap 03/08/2013   Zoster Recombinant(Shingrix ) 08/07/2018, 10/22/2018   Zoster, Live 04/20/2013    Health Maintenance  Topic Date Due   Medicare Annual  Wellness (AWV)  Never done   COVID-19 Vaccine (4 - 2024-25 season) 06/09/2023   INFLUENZA VACCINE  05/08/2024   DTaP/Tdap/Td (3 - Td or Tdap) 09/24/2024 (Originally 03/09/2023)   Colonoscopy  09/08/2027   Pneumococcal Vaccine: 50+ Years  Completed   Hepatitis C Screening  Completed   Zoster Vaccines- Shingrix   Completed   HPV VACCINES  Aged Out   Meningococcal B Vaccine  Aged Out   Pneumococcal Vaccine  Discontinued     Discussed health benefits of physical activity, and encouraged him to engage in regular exercise appropriate for his age and condition.    Problem List Items Addressed This Visit   None   No follow-ups on file.     Glade JINNY Hope, MD

## 2024-05-26 ENCOUNTER — Ambulatory Visit (INDEPENDENT_AMBULATORY_CARE_PROVIDER_SITE_OTHER): Admitting: Internal Medicine

## 2024-05-26 ENCOUNTER — Ambulatory Visit: Payer: Self-pay | Admitting: Internal Medicine

## 2024-05-26 VITALS — BP 130/80 | HR 66 | Temp 98.2°F | Ht 75.0 in | Wt 194.0 lb

## 2024-05-26 DIAGNOSIS — E782 Mixed hyperlipidemia: Secondary | ICD-10-CM

## 2024-05-26 DIAGNOSIS — Z85828 Personal history of other malignant neoplasm of skin: Secondary | ICD-10-CM

## 2024-05-26 DIAGNOSIS — R7303 Prediabetes: Secondary | ICD-10-CM

## 2024-05-26 DIAGNOSIS — E538 Deficiency of other specified B group vitamins: Secondary | ICD-10-CM

## 2024-05-26 DIAGNOSIS — Z0000 Encounter for general adult medical examination without abnormal findings: Secondary | ICD-10-CM | POA: Diagnosis not present

## 2024-05-26 DIAGNOSIS — N400 Enlarged prostate without lower urinary tract symptoms: Secondary | ICD-10-CM

## 2024-05-26 DIAGNOSIS — E559 Vitamin D deficiency, unspecified: Secondary | ICD-10-CM | POA: Diagnosis not present

## 2024-05-26 DIAGNOSIS — I6930 Unspecified sequelae of cerebral infarction: Secondary | ICD-10-CM

## 2024-05-26 DIAGNOSIS — K219 Gastro-esophageal reflux disease without esophagitis: Secondary | ICD-10-CM

## 2024-05-26 DIAGNOSIS — Z8673 Personal history of transient ischemic attack (TIA), and cerebral infarction without residual deficits: Secondary | ICD-10-CM

## 2024-05-26 DIAGNOSIS — Z8546 Personal history of malignant neoplasm of prostate: Secondary | ICD-10-CM

## 2024-05-26 LAB — CBC WITH DIFFERENTIAL/PLATELET
Basophils Absolute: 0 K/uL (ref 0.0–0.1)
Basophils Relative: 0.6 % (ref 0.0–3.0)
Eosinophils Absolute: 0.2 K/uL (ref 0.0–0.7)
Eosinophils Relative: 2.2 % (ref 0.0–5.0)
HCT: 49.4 % (ref 39.0–52.0)
Hemoglobin: 16.6 g/dL (ref 13.0–17.0)
Lymphocytes Relative: 21.1 % (ref 12.0–46.0)
Lymphs Abs: 1.5 K/uL (ref 0.7–4.0)
MCHC: 33.6 g/dL (ref 30.0–36.0)
MCV: 93.8 fl (ref 78.0–100.0)
Monocytes Absolute: 0.7 K/uL (ref 0.1–1.0)
Monocytes Relative: 9.3 % (ref 3.0–12.0)
Neutro Abs: 4.7 K/uL (ref 1.4–7.7)
Neutrophils Relative %: 66.8 % (ref 43.0–77.0)
Platelets: 191 K/uL (ref 150.0–400.0)
RBC: 5.27 Mil/uL (ref 4.22–5.81)
RDW: 13.6 % (ref 11.5–15.5)
WBC: 7 K/uL (ref 4.0–10.5)

## 2024-05-26 LAB — LIPID PANEL
Cholesterol: 128 mg/dL (ref 0–200)
HDL: 72.2 mg/dL (ref >39.00)
LDL Cholesterol: 42 mg/dL (ref 0–99)
NonHDL: 55.4
Total CHOL/HDL Ratio: 2
Triglycerides: 67 mg/dL (ref 0.0–149.0)
VLDL: 13.4 mg/dL (ref 0.0–40.0)

## 2024-05-26 LAB — COMPREHENSIVE METABOLIC PANEL WITH GFR
ALT: 51 U/L (ref 0–53)
AST: 36 U/L (ref 0–37)
Albumin: 4.8 g/dL (ref 3.5–5.2)
Alkaline Phosphatase: 64 U/L (ref 39–117)
BUN: 14 mg/dL (ref 6–23)
CO2: 29 meq/L (ref 19–32)
Calcium: 9.5 mg/dL (ref 8.4–10.5)
Chloride: 101 meq/L (ref 96–112)
Creatinine, Ser: 1.06 mg/dL (ref 0.40–1.50)
GFR: 69.2 mL/min (ref >60.00)
Glucose, Bld: 110 mg/dL — ABNORMAL HIGH (ref 70–99)
Potassium: 5 meq/L (ref 3.5–5.1)
Sodium: 139 meq/L (ref 135–145)
Total Bilirubin: 0.7 mg/dL (ref 0.2–1.2)
Total Protein: 7.7 g/dL (ref 6.0–8.3)

## 2024-05-26 LAB — VITAMIN D 25 HYDROXY (VIT D DEFICIENCY, FRACTURES): VITD: 67.63 ng/mL (ref 30.00–100.00)

## 2024-05-26 LAB — HEMOGLOBIN A1C: Hgb A1c MFr Bld: 6.2 % (ref 4.6–6.5)

## 2024-05-26 LAB — TSH: TSH: 2.86 u[IU]/mL (ref 0.35–5.50)

## 2024-05-26 LAB — VITAMIN B12: Vitamin B-12: 627 pg/mL (ref 211–911)

## 2024-05-26 NOTE — Assessment & Plan Note (Signed)
Follows with dermatology twice a year. ?

## 2024-05-26 NOTE — Assessment & Plan Note (Signed)
 Chronic Taking vitamin D supplementation daily Check vitamin d level

## 2024-05-26 NOTE — Assessment & Plan Note (Signed)
 Chronic Lab Results  Component Value Date   HGBA1C 6.2 10/15/2023   Check a1c Low sugar / carb diet Stressed regular exercise

## 2024-05-26 NOTE — Assessment & Plan Note (Signed)
 Chronic On rapaflo, solifenacin Management per urology

## 2024-05-26 NOTE — Assessment & Plan Note (Signed)
 History of CVA Deficit in fine motor skills and right hand Continue Crestor 20 mg, aspirin 325 mg daily

## 2024-05-26 NOTE — Assessment & Plan Note (Signed)
 Chronic Check lipid panel, CMP, CBC, tsh Continue crestor  20 mg  Regular exercise and healthy diet encouraged

## 2024-05-26 NOTE — Assessment & Plan Note (Signed)
Chronic Check B12 level Continue B12 supplementation 

## 2024-05-26 NOTE — Assessment & Plan Note (Signed)
Chronic GERD controlled Continue pantoprazole 40 mg daily given history of esophageal stricture

## 2024-05-28 ENCOUNTER — Telehealth: Payer: Self-pay | Admitting: Cardiovascular Disease

## 2024-05-28 DIAGNOSIS — E7800 Pure hypercholesterolemia, unspecified: Secondary | ICD-10-CM

## 2024-05-28 NOTE — Telephone Encounter (Signed)
 Called patient with Dr. Tyrone message. Patient will get lipids checked in 3 months and see what his LDL is looking like. Placed order for lipids.

## 2024-05-28 NOTE — Telephone Encounter (Signed)
 Called patient back about message. Patient complaining of cramping last 3 weeks, and he decided to stop his Zetia  4 days ago and cramping has stopped. Patient stated 2 days ago his PCP, Dr. Geofm did lab work and his LDL is 42. Patient wants to know if this is okay. Patient stated he has had no problems with the Crestor  20 mg. Patient wanted to see if there is something else he could take besides the Zetia . Will send to Dr. Francyne for advisement.

## 2024-05-28 NOTE — Telephone Encounter (Signed)
 Please continue the rosuvastatin  as a stand-alone medication.  We were trying to get a target LDL cholesterol less than 70 and he was just barely above that (73) when we started the Zetia .  Let's see what his next lipid profile is in several months.  If necessary we can increase the rosuvastatin  to 40 mg once daily, but hope this will be necessary.

## 2024-05-28 NOTE — Telephone Encounter (Signed)
 Pt c/o medication issue:  1. Name of Medication:   ezetimibe  (ZETIA ) 10 MG tablet   2. How are you currently taking this medication (dosage and times per day)?   As prescribed  3. Are you having a reaction (difficulty breathing--STAT)?   4. What is your medication issue?   Patient stated that this medication has been giving him cramps and he stopped it for the last 4 days and the cramping has gone away.  Patient wants to get alternate medication.

## 2024-08-05 ENCOUNTER — Telehealth: Payer: Self-pay

## 2024-08-05 ENCOUNTER — Encounter: Payer: Self-pay | Admitting: Internal Medicine

## 2024-08-05 ENCOUNTER — Telehealth (HOSPITAL_BASED_OUTPATIENT_CLINIC_OR_DEPARTMENT_OTHER): Payer: Self-pay

## 2024-08-05 ENCOUNTER — Encounter: Payer: Self-pay | Admitting: Neurology

## 2024-08-05 ENCOUNTER — Telehealth: Payer: Self-pay | Admitting: Neurology

## 2024-08-05 NOTE — Telephone Encounter (Signed)
 Preop tele appt now scheduled, med rec and consent done.

## 2024-08-05 NOTE — Telephone Encounter (Signed)
 Sylvia called in to follow up on clearance with [facility].   FYI: Informed Jos that the provider is out and Nurse will follow up tomorrow.

## 2024-08-05 NOTE — Telephone Encounter (Signed)
 We got a fax from [facility] for preoperative clearance put it in patel mailbox

## 2024-08-05 NOTE — Telephone Encounter (Signed)
   Name: Steve [name]  DOB: Sep 27, 1951  MRN: [id number]  Primary Cardiologist: Jerel Balding, MD   Preoperative team, please contact this patient and set up a phone call appointment for further preoperative risk assessment. Please obtain consent and complete medication review. Thank you for your help.  I confirm that guidance regarding antiplatelet and oral anticoagulation therapy has been completed and, if necessary, noted below.  I also confirmed the patient resides in the state of [place] . As per [facility] telemedicine laws, the patient must reside in the state in which the provider is licensed.   [name], NP 08/05/2024, 11:18 AM [facility]

## 2024-08-05 NOTE — Telephone Encounter (Signed)
   Pre-operative Risk Assessment    Patient Name: Steve [name]  DOB: Oct 04, 1951 MRN: [id number]   Date of last office visit: 04/08/24 with Dr. Francyne Date of next office visit: NA  Request for Surgical Clearance    Procedure:  Left reverse total shoulder arthroplasty   Date of Surgery:  Clearance TBD                                 Surgeon:  Dr. Josefina Surgeon's Group or Practice Name:  [facility] Phone number:  [phone] Fax number:  [phone]   Type of Clearance Requested:   - Medical  - Pharmacy:  Hold Aspirin  not indicated   Type of Anesthesia:  General and interscalene block   Additional requests/questions:    SignedAugustin JONETTA Daring   08/05/2024, 11:08 AM

## 2024-08-05 NOTE — Telephone Encounter (Signed)
  Patient Consent for Virtual Visit        Steve [name] has provided verbal consent on 08/05/2024 for a virtual visit (video or telephone).   CONSENT FOR VIRTUAL VISIT FOR:  Steve [name]  By participating in this virtual visit I agree to the following:  I hereby voluntarily request, consent and authorize [facility] and its employed or contracted physicians, physician assistants, nurse practitioners or other licensed health care professionals (the Practitioner), to provide me with telemedicine health care services (the "Services) as deemed necessary by the treating Practitioner. I acknowledge and consent to receive the Services by the Practitioner via telemedicine. I understand that the telemedicine visit will involve communicating with the Practitioner through live audiovisual communication technology and the disclosure of certain medical information by electronic transmission. I acknowledge that I have been given the opportunity to request an in-person assessment or other available alternative prior to the telemedicine visit and am voluntarily participating in the telemedicine visit.  I understand that I have the right to withhold or withdraw my consent to the use of telemedicine in the course of my care at any time, without affecting my right to future care or treatment, and that the Practitioner or I may terminate the telemedicine visit at any time. I understand that I have the right to inspect all information obtained and/or recorded in the course of the telemedicine visit and may receive copies of available information for a reasonable fee.  I understand that some of the potential risks of receiving the Services via telemedicine include:  Delay or interruption in medical evaluation due to technological equipment failure or disruption; Information transmitted may not be sufficient (e.g. poor resolution of images) to allow for appropriate medical decision making by the  Practitioner; and/or  In rare instances, security protocols could fail, causing a breach of personal health information.  Furthermore, I acknowledge that it is my responsibility to provide information about my medical history, conditions and care that is complete and accurate to the best of my ability. I acknowledge that Practitioner's advice, recommendations, and/or decision may be based on factors not within their control, such as incomplete or inaccurate data provided by me or distortions of diagnostic images or specimens that may result from electronic transmissions. I understand that the practice of medicine is not an exact science and that Practitioner makes no warranties or guarantees regarding treatment outcomes. I acknowledge that a copy of this consent can be made available to me via my patient portal [facility] MyChart), or I can request a printed copy by calling the office of [facility].    I understand that my insurance will be billed for this visit.   I have read or had this consent read to me. I understand the contents of this consent, which adequately explains the benefits and risks of the Services being provided via telemedicine.  I have been provided ample opportunity to ask questions regarding this consent and the Services and have had my questions answered to my satisfaction. I give my informed consent for the services to be provided through the use of telemedicine in my medical care

## 2024-08-05 NOTE — Telephone Encounter (Unsigned)
 Copied from CRM #[id number]. Topic: General - Other >> Aug 05, 2024  3:36 PM Thersia BROCKS wrote: Reason for CRM: Patient called in regarding clearance paperwork signed, informed patient the timeframe ,  would like to know if Dr.Burns could get that signed as soon as possible

## 2024-08-06 NOTE — Telephone Encounter (Signed)
 Patient notified on Mychart

## 2024-08-06 NOTE — Telephone Encounter (Signed)
 Completed.

## 2024-08-06 NOTE — Telephone Encounter (Signed)
 Form retrieved from box and placed on Dr. Geofm desk for review.

## 2024-08-07 ENCOUNTER — Ambulatory Visit: Attending: Cardiovascular Disease | Admitting: Emergency Medicine

## 2024-08-07 DIAGNOSIS — Z01810 Encounter for preprocedural cardiovascular examination: Secondary | ICD-10-CM | POA: Diagnosis not present

## 2024-08-07 NOTE — Progress Notes (Signed)
 Virtual Visit via Telephone Note   Because of Steve [name] co-morbid illnesses, he is at least at moderate risk for complications without adequate follow up.  This format is felt to be most appropriate for this patient at this time.  Due to technical limitations with video connection (technology), today's appointment will be conducted as an audio only telehealth visit, and Steve [name] verbally agreed to proceed in this manner.   All issues noted in this document were discussed and addressed.  No physical exam could be performed with this format.  Evaluation Performed:  Preoperative cardiovascular risk assessment _____________   Date:  08/07/2024   Patient ID:  Steve [name], DOB January 19, 1951, MRN [id number] Patient Location:  Home Provider location:   Office  Primary Care Provider:  Geofm Glade PARAS, MD Primary Cardiologist:  Jerel Balding, MD  Chief Complaint / Patient Profile   73 y.o. 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  History of Present Illness    Steve [name] is a 73 y.o. male who presents via audio/video conferencing for a telehealth visit today.  Pt was last seen in cardiology clinic on 04/08/2024 by Dr. Balding.  At that time Steve [name] was doing well.  The patient is now pending procedure as outlined above. Since his last visit, He denies chest pain, palpitations, dyspnea, orthopnea, n, v,  dark/tarry/bloody stools, hematuria, dizziness, syncope, edema, weight gain.   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   Sleep apnea    Resolved   Squamous cell skin cancer    Dr.King [place]   Stricture esophagus 2013    dilation X 1   Stroke (HCC) 2016   left semiovale stroke    TIA (transient ischemic attack) 01/2015   Ulcerative esophagitis 2013   Past Surgical History:  Procedure Laterality Date   BUBBLE STUDY  08/11/2021   Procedure: BUBBLE STUDY;  Surgeon: Raford Riggs, MD;  Location: [facility] ENDOSCOPY;  Service: Cardiovascular;;   COLONOSCOPY W/ POLYPECTOMY  2013, 2018 last done   tubular adenoma   COLONOSCOPY W/ POLYPECTOMY  2008   hyperplastic   CYSTOSCOPY N/A 09/19/2020   Procedure: CYSTOSCOPY FLEXIBLE;  Surgeon: Matilda Senior, MD;  Location: [facility];  Service: Urology;  Laterality: N/A;   EP IMPLANTABLE DEVICE N/A 01/03/2016   Procedure: Loop Recorder Insertion;  Surgeon: Jerel Balding, MD;  Location: [facility] INVASIVE CV LAB;  Service: Cardiovascular;  Laterality: N/A;   MOHS SURGERY     x 3   NASAL SINUS SURGERY  15 yrs ago   PATENT FORAMEN OVALE(PFO) CLOSURE N/A  03/27/2018   Procedure: PATENT FORAMEN OVALE (PFO) CLOSURE;  Surgeon: Wonda Sharper, MD;  Location: [facility] INVASIVE CV LAB;  Service: Cardiovascular;  Laterality: N/A;   PROSTATE BIOPSY  2021   RADIOACTIVE SEED IMPLANT N/A 09/19/2020   Procedure: RADIOACTIVE SEED IMPLANT/BRACHYTHERAPY IMPLANT;  Surgeon: Matilda Senior, MD;  Location[facility];  Service: Urology;  Laterality: N/A;  90 MINS   REVERSE SHOULDER ARTHROPLASTY Right 03/10/2019   Procedure: REVERSE SHOULDER ARTHROPLASTY;  Surgeon: Josefina Chew, MD;  Location: [facility] ORS;  Service: Orthopedics;   Laterality: Right;   ROTATOR CUFF REPAIR Left yrs ago   SPACE OAR INSTILLATION N/A 09/19/2020   Procedure: SPACE OAR INSTILLATION;  Surgeon: Matilda Senior, MD;  Location: [facility];  Service: Urology;  Laterality: N/A;   TEE WITHOUT CARDIOVERSION N/A 01/06/2018   Procedure: TRANSESOPHAGEAL ECHOCARDIOGRAM (TEE);  Surgeon: Francyne Headland, MD;  Location: [facility] ENDOSCOPY;  Service: Cardiovascular;  Laterality: N/A;   TEE WITHOUT CARDIOVERSION N/A 08/11/2021   Procedure: TRANSESOPHAGEAL ECHOCARDIOGRAM (TEE);  Surgeon: Raford Riggs, MD;  Location: [facility] ENDOSCOPY;  Service: Cardiovascular;  Laterality: N/A;   torn biceps tendon Left yrs ago   surgical repair   uvulectomy & tonsillectomy  2008   Dr Mable    Allergies  Allergies  Allergen Reactions   Bee Pollen Itching    Eyes itch and water    Pollen Extract Itching    Eyes itch and water     Home Medications    Prior to Admission medications   Medication Sig Start Date End Date Taking? Authorizing Provider  aspirin  325 MG tablet Take 325 mg by mouth daily. 1 Tablet Daily in The Morning    [provider]  botulinum toxin Type A (BOTOX) 100 units SOLR injection Inject 100 Units into the muscle every 3 (three) months.    [provider]  cholecalciferol (VITAMIN D3) 25 MCG (1000 UNIT) tablet Take 1,000 Units by mouth daily.    [provider]  fluticasone  (FLONASE ) 50 MCG/ACT nasal spray ONE SPRAY IN EACH NOSTRIL TWICE DAILY ASNEEDED -USE CROSSOVER TECHNIQUE DISCUSSED 06/23/21   Geofm Glade PARAS, MD  Multiple Vitamins-Minerals (PRESERVISION AREDS PO) Take 1 capsule by mouth 2 (two) times daily. 05/02/20   [provider]  pantoprazole  (PROTONIX ) 40 MG tablet TAKE ONE TABLET BY MOUTH EVERY DAY 01/01/24   Geofm Glade PARAS, MD  Polyethyl Glycol-Propyl Glycol (SYSTANE) 0.4-0.3 % GEL ophthalmic gel Place 1 application into both eyes at bedtime.    [provider]  rosuvastatin  (CRESTOR )  20 MG tablet Take 1 tablet (20 mg total) by mouth daily. 08/19/23   Geofm Glade PARAS, MD  sildenafil (REVATIO) 20 MG tablet Take 40 mg by mouth as needed. Patient taking differently: Take 40 mg by mouth as needed. Taking 10 mg    [provider]  silodosin (RAPAFLO) 8 MG CAPS capsule Take 8 mg by mouth daily with breakfast. 09/17/23   [provider]  SOLIFENACIN SUCCINATE PO Take 10 mg by mouth daily.    [provider]  vardenafil (LEVITRA) 20 MG tablet Take 20 mg by mouth daily as needed for erectile dysfunction.    [provider]  vitamin B-12 (CYANOCOBALAMIN ) 1000 MCG tablet Take 1,000 mcg by mouth daily.    [provider]  vitamin C (ASCORBIC ACID) 500 MG tablet Take 500 mg by mouth daily.    [provider]    Physical Exam    Vital Signs:  Steve [name] does not have  vital signs available for review today.  Given telephonic nature of communication, physical exam is limited. AAOx3. NAD. Normal affect.  Speech and respirations are unlabored.  Accessory Clinical Findings    None  Assessment & Plan    1.  Preoperative Cardiovascular Risk Assessment: According to the Revised Cardiac Risk Index (RCRI), his Perioperative Risk of Major Cardiac Event is (%): 0.9  His Functional Capacity in METs is: 7.34 according to the Duke Activity Status Index (DASI). Therefore, based on ACC/AHA guidelines, patient would be at acceptable risk for the planned procedure without further cardiovascular testing. I will route this recommendation to the requesting party via Epic fax function.  The patient was advised that if he develops new symptoms prior to surgery to contact our office to arrange for a follow-up visit, and he verbalized understanding.  Regarding ASA therapy, we recommend continuation of ASA throughout the perioperative period.  However, if the surgeon feels that cessation of ASA is required, consult neurology for aspirin  hold  recommendations given prior hx of multiple CVAs and carotid stenosis. From a cardiac standpoint, okay to hold 5-7 days prior to procedure if necessary, to be resumed as soon as safely possible following procedure.   A copy of this note will be routed to requesting surgeon.  Time:   Today, I have spent 10 minutes with the patient with telehealth technology discussing medical history, symptoms, and management plan.     Eimi Viney E Sheera Illingworth, NP  08/07/2024, 7:42 AM

## 2024-08-19 ENCOUNTER — Encounter: Payer: Self-pay | Admitting: Cardiovascular Disease

## 2024-08-19 DIAGNOSIS — E7800 Pure hypercholesterolemia, unspecified: Secondary | ICD-10-CM

## 2024-08-25 LAB — LIPID PANEL
Chol/HDL Ratio: 3 ratio (ref 0.0–5.0)
Cholesterol, Total: 144 mg/dL (ref 100–199)
HDL: 48 mg/dL (ref >39)
LDL Chol Calc (NIH): 77 mg/dL (ref 0–99)
Triglycerides: 101 mg/dL (ref 0–149)
VLDL Cholesterol Cal: 19 mg/dL (ref 5–40)

## 2024-08-26 ENCOUNTER — Ambulatory Visit: Payer: Self-pay | Admitting: Cardiovascular Disease

## 2024-09-09 ENCOUNTER — Other Ambulatory Visit: Payer: Self-pay | Admitting: Internal Medicine

## 2024-09-28 ENCOUNTER — Telehealth: Payer: Self-pay | Admitting: Cardiovascular Disease

## 2024-09-28 ENCOUNTER — Telehealth: Payer: Self-pay | Admitting: Neurology

## 2024-09-28 NOTE — Telephone Encounter (Signed)
 Pt calling in regards to holding Aspirin  7 days prior to eye surgery. Pt stating has hemifacial spasm, they are lifting eyelid so it doesn't cover pupils. Get regular botox injections for eyes as well. Surgery scheduled for Jan 6. Pt wanting Dr. Francyne recommendations before discontinuing Aspirin .  Sending to Dr Francyne for further advisement. Pt verbalizes understanding of plan.

## 2024-09-28 NOTE — Telephone Encounter (Signed)
 Pt c/o medication issue:  1. Name of Medication:   aspirin  325 MG tablet   2. How are you currently taking this medication (dosage and times per day)?   As prescribed  3. Are you having a reaction (difficulty breathing--STAT)?   4. What is your medication issue?   Patient wants a call back to discuss holding this medication prior to eye surgery.

## 2024-09-28 NOTE — Telephone Encounter (Signed)
 Spoke with patient regarding Aspirin  to be held prior to eye surgery. Per Dr. JAYSON: OK to hold Aspirin  for 7 days. Pt also given fax number for pre-op clearance to be sent [phone]. Pt verbalized understanding of plan.

## 2024-09-28 NOTE — Telephone Encounter (Signed)
 Called patient and informed him that it would be okay for him to stop aspirin  7 days prior to surgery. Patient verbalized understanding and had no further questions or concerns.

## 2024-09-28 NOTE — Telephone Encounter (Signed)
 Reason: Having eye surgery on JAN 6. Hank wanted to confirm if he needed to stop taking Asprin before his eye surgery. Provider that is doing the surgery would prefer if he was able to stop taking it by surgery.  He requested a call back to discuss with Tobie or nurse.   PH: [phone]

## 2024-09-28 NOTE — Telephone Encounter (Signed)
 Please let pt know that it would be okay for him to stop aspirin  7 days prior to surgery.

## 2024-09-28 NOTE — Telephone Encounter (Signed)
 OK to hold the aspirin  for 7 days

## 2024-10-05 ENCOUNTER — Telehealth: Payer: Self-pay | Admitting: Cardiovascular Disease

## 2024-10-05 NOTE — Telephone Encounter (Signed)
 OK to continue taking tramadol at bedtime

## 2024-10-05 NOTE — Telephone Encounter (Signed)
 S/w the patient and informed that he may take tramadol at bedtime. He verbalized understanding.

## 2024-10-05 NOTE — Telephone Encounter (Signed)
 Pt had reverse shoulder surgery on 09/10/24.   Took Oxy for 4 days, stopped that. Tramadol was ordered for pain instead. Pt only taking one at night to help with sleep. Hx stroke. Pt wanting to know if he is able to continue taking this for sleep or if Dr. Francyne recommends something different for his regimen. Will send to Dr. Francyne for further advisement. Pt verbalizes understanding and aware that someone will call with update.

## 2024-10-05 NOTE — Telephone Encounter (Signed)
 Pt has been prescribed tramadol by his ortho and would like to make sure it is safe for him to take. Please advise.

## 2024-10-14 ENCOUNTER — Telehealth: Payer: Self-pay | Admitting: Cardiovascular Disease

## 2024-10-14 NOTE — Telephone Encounter (Signed)
 Pt c/o medication issue:  1. Name of Medication:   aspirin  325 MG tablet    2. How are you currently taking this medication (dosage and times per day)? Currently holding   3. Are you having a reaction (difficulty breathing--STAT)? No   4. What is your medication issue? Pt has been holding aspirin  for 7 days for surgery. Now his eye surgeon is asking if it is okay for him to hold for another 5 days. Please advise.

## 2024-10-15 NOTE — Telephone Encounter (Signed)
 Pt calling again to ask if he can continue holding aspirin . He is nervous about holding and requesting a c/b today. Please advise.

## 2024-10-22 ENCOUNTER — Other Ambulatory Visit: Payer: Self-pay

## 2024-10-22 ENCOUNTER — Inpatient Hospital Stay (HOSPITAL_COMMUNITY)

## 2024-10-22 ENCOUNTER — Emergency Department (HOSPITAL_COMMUNITY)

## 2024-10-22 ENCOUNTER — Encounter (HOSPITAL_COMMUNITY)
Admission: EM | Disposition: A | Discharge status: Home / Self Care | Payer: Self-pay | Source: Home / Self Care | Attending: Neurology

## 2024-10-22 ENCOUNTER — Inpatient Hospital Stay (HOSPITAL_COMMUNITY)
Admission: EM | Admit: 2024-10-22 | Discharge: 2024-10-24 | DRG: 024 | Disposition: A | Discharge status: Home / Self Care | Attending: Neurology | Admitting: Neurology

## 2024-10-22 ENCOUNTER — Inpatient Hospital Stay (HOSPITAL_COMMUNITY): Admitting: Certified Registered Nurse Anesthetist

## 2024-10-22 DIAGNOSIS — I63132 Cerebral infarction due to embolism of left carotid artery: Secondary | ICD-10-CM | POA: Diagnosis not present

## 2024-10-22 DIAGNOSIS — Z87891 Personal history of nicotine dependence: Secondary | ICD-10-CM

## 2024-10-22 DIAGNOSIS — Z85828 Personal history of other malignant neoplasm of skin: Secondary | ICD-10-CM

## 2024-10-22 DIAGNOSIS — Z833 Family history of diabetes mellitus: Secondary | ICD-10-CM | POA: Diagnosis not present

## 2024-10-22 DIAGNOSIS — I63232 Cerebral infarction due to unspecified occlusion or stenosis of left carotid arteries: Secondary | ICD-10-CM | POA: Diagnosis not present

## 2024-10-22 DIAGNOSIS — Z79899 Other long term (current) drug therapy: Secondary | ICD-10-CM | POA: Diagnosis not present

## 2024-10-22 DIAGNOSIS — E785 Hyperlipidemia, unspecified: Secondary | ICD-10-CM

## 2024-10-22 DIAGNOSIS — I6523 Occlusion and stenosis of bilateral carotid arteries: Secondary | ICD-10-CM | POA: Diagnosis not present

## 2024-10-22 DIAGNOSIS — I10 Essential (primary) hypertension: Secondary | ICD-10-CM | POA: Diagnosis present

## 2024-10-22 DIAGNOSIS — G4733 Obstructive sleep apnea (adult) (pediatric): Secondary | ICD-10-CM | POA: Diagnosis present

## 2024-10-22 DIAGNOSIS — Z8249 Family history of ischemic heart disease and other diseases of the circulatory system: Secondary | ICD-10-CM

## 2024-10-22 DIAGNOSIS — I63412 Cerebral infarction due to embolism of left middle cerebral artery: Secondary | ICD-10-CM

## 2024-10-22 DIAGNOSIS — I669 Occlusion and stenosis of unspecified cerebral artery: Secondary | ICD-10-CM | POA: Diagnosis not present

## 2024-10-22 DIAGNOSIS — Z96611 Presence of right artificial shoulder joint: Secondary | ICD-10-CM | POA: Diagnosis present

## 2024-10-22 DIAGNOSIS — R29810 Facial weakness: Secondary | ICD-10-CM | POA: Diagnosis present

## 2024-10-22 DIAGNOSIS — Z8546 Personal history of malignant neoplasm of prostate: Secondary | ICD-10-CM

## 2024-10-22 DIAGNOSIS — Z83719 Family history of colon polyps, unspecified: Secondary | ICD-10-CM

## 2024-10-22 DIAGNOSIS — I6389 Other cerebral infarction: Secondary | ICD-10-CM

## 2024-10-22 DIAGNOSIS — F32A Depression, unspecified: Secondary | ICD-10-CM | POA: Diagnosis present

## 2024-10-22 DIAGNOSIS — Z8774 Personal history of (corrected) congenital malformations of heart and circulatory system: Secondary | ICD-10-CM

## 2024-10-22 DIAGNOSIS — I6522 Occlusion and stenosis of left carotid artery: Secondary | ICD-10-CM | POA: Diagnosis present

## 2024-10-22 DIAGNOSIS — Z7982 Long term (current) use of aspirin: Secondary | ICD-10-CM

## 2024-10-22 DIAGNOSIS — H1133 Conjunctival hemorrhage, bilateral: Secondary | ICD-10-CM | POA: Diagnosis present

## 2024-10-22 DIAGNOSIS — Z823 Family history of stroke: Secondary | ICD-10-CM

## 2024-10-22 DIAGNOSIS — I639 Cerebral infarction, unspecified: Principal | ICD-10-CM | POA: Diagnosis present

## 2024-10-22 DIAGNOSIS — Z8719 Personal history of other diseases of the digestive system: Secondary | ICD-10-CM

## 2024-10-22 DIAGNOSIS — Z860100 Personal history of colon polyps, unspecified: Secondary | ICD-10-CM

## 2024-10-22 DIAGNOSIS — Z8673 Personal history of transient ischemic attack (TIA), and cerebral infarction without residual deficits: Secondary | ICD-10-CM

## 2024-10-22 DIAGNOSIS — R4701 Aphasia: Secondary | ICD-10-CM | POA: Diagnosis present

## 2024-10-22 DIAGNOSIS — K219 Gastro-esophageal reflux disease without esophagitis: Secondary | ICD-10-CM | POA: Diagnosis present

## 2024-10-22 DIAGNOSIS — H1130 Conjunctival hemorrhage, unspecified eye: Secondary | ICD-10-CM | POA: Diagnosis not present

## 2024-10-22 DIAGNOSIS — Z8042 Family history of malignant neoplasm of prostate: Secondary | ICD-10-CM | POA: Diagnosis not present

## 2024-10-22 DIAGNOSIS — R29705 NIHSS score 5: Secondary | ICD-10-CM | POA: Diagnosis present

## 2024-10-22 DIAGNOSIS — I440 Atrioventricular block, first degree: Secondary | ICD-10-CM | POA: Diagnosis present

## 2024-10-22 DIAGNOSIS — R29701 NIHSS score 1: Secondary | ICD-10-CM | POA: Diagnosis not present

## 2024-10-22 DIAGNOSIS — I6602 Occlusion and stenosis of left middle cerebral artery: Secondary | ICD-10-CM

## 2024-10-22 DIAGNOSIS — Z9109 Other allergy status, other than to drugs and biological substances: Secondary | ICD-10-CM

## 2024-10-22 DIAGNOSIS — R29703 NIHSS score 3: Secondary | ICD-10-CM | POA: Diagnosis not present

## 2024-10-22 DIAGNOSIS — G5131 Clonic hemifacial spasm, right: Secondary | ICD-10-CM | POA: Diagnosis present

## 2024-10-22 DIAGNOSIS — R030 Elevated blood-pressure reading, without diagnosis of hypertension: Secondary | ICD-10-CM

## 2024-10-22 DIAGNOSIS — R29704 NIHSS score 4: Secondary | ICD-10-CM | POA: Diagnosis not present

## 2024-10-22 HISTORY — PX: IR US GUIDE VASC ACCESS RIGHT: IMG2390

## 2024-10-22 HISTORY — PX: RADIOLOGY WITH ANESTHESIA: SHX6223

## 2024-10-22 HISTORY — PX: IR PERCUTANEOUS ART THROMBECTOMY/INFUSION INTRACRANIAL INC DIAG ANGIO: IMG6087

## 2024-10-22 LAB — PROTIME-INR
INR: 1 (ref 0.8–1.2)
Prothrombin Time: 13.3 s (ref 11.4–15.2)

## 2024-10-22 LAB — ECHOCARDIOGRAM COMPLETE
AR max vel: 2.78 cm2
AV Peak grad: 3.3 mmHg
Ao pk vel: 0.91 m/s
Area-P 1/2: 4.39 cm2
Height: 75 in
MV VTI: 1.86 cm2
S' Lateral: 3.1 cm
Weight: 3352 [oz_av]

## 2024-10-22 LAB — CBC
HCT: 44.8 % (ref 39.0–52.0)
Hemoglobin: 15.6 g/dL (ref 13.0–17.0)
MCH: 32.1 pg (ref 26.0–34.0)
MCHC: 34.8 g/dL (ref 30.0–36.0)
MCV: 92.2 fL (ref 80.0–100.0)
Platelets: 186 K/uL (ref 150–400)
RBC: 4.86 MIL/uL (ref 4.22–5.81)
RDW: 12.2 % (ref 11.5–15.5)
WBC: 5.9 K/uL (ref 4.0–10.5)
nRBC: 0 % (ref 0.0–0.2)

## 2024-10-22 LAB — COMPREHENSIVE METABOLIC PANEL WITH GFR
ALT: 23 U/L (ref 0–44)
AST: 24 U/L (ref 15–41)
Albumin: 4.3 g/dL (ref 3.5–5.0)
Alkaline Phosphatase: 89 U/L (ref 38–126)
Anion gap: 11 (ref 5–15)
BUN: 16 mg/dL (ref 8–23)
CO2: 25 mmol/L (ref 22–32)
Calcium: 9.2 mg/dL (ref 8.9–10.3)
Chloride: 104 mmol/L (ref 98–111)
Creatinine, Ser: 1 mg/dL (ref 0.61–1.24)
GFR, Estimated: >60 mL/min
Glucose, Bld: 128 mg/dL — ABNORMAL HIGH (ref 70–99)
Potassium: 4.4 mmol/L (ref 3.5–5.1)
Sodium: 139 mmol/L (ref 135–145)
Total Bilirubin: 0.3 mg/dL (ref 0.0–1.2)
Total Protein: 6.9 g/dL (ref 6.5–8.1)

## 2024-10-22 LAB — I-STAT CHEM 8, ED
BUN: 17 mg/dL (ref 8–23)
Calcium, Ion: 1.12 mmol/L — ABNORMAL LOW (ref 1.15–1.40)
Chloride: 102 mmol/L (ref 98–111)
Creatinine, Ser: 1 mg/dL (ref 0.61–1.24)
Glucose, Bld: 127 mg/dL — ABNORMAL HIGH (ref 70–99)
HCT: 46 % (ref 39.0–52.0)
Hemoglobin: 15.6 g/dL (ref 13.0–17.0)
Potassium: 4.2 mmol/L (ref 3.5–5.1)
Sodium: 141 mmol/L (ref 135–145)
TCO2: 25 mmol/L (ref 22–32)

## 2024-10-22 LAB — DIFFERENTIAL
Abs Immature Granulocytes: 0.04 K/uL (ref 0.00–0.07)
Basophils Absolute: 0.1 K/uL (ref 0.0–0.1)
Basophils Relative: 1 %
Eosinophils Absolute: 0.2 K/uL (ref 0.0–0.5)
Eosinophils Relative: 3 %
Immature Granulocytes: 1 %
Lymphocytes Relative: 35 %
Lymphs Abs: 2.1 K/uL (ref 0.7–4.0)
Monocytes Absolute: 0.7 K/uL (ref 0.1–1.0)
Monocytes Relative: 11 %
Neutro Abs: 2.9 K/uL (ref 1.7–7.7)
Neutrophils Relative %: 49 %

## 2024-10-22 LAB — CBG MONITORING, ED: Glucose-Capillary: 128 mg/dL — ABNORMAL HIGH (ref 70–99)

## 2024-10-22 LAB — ETHANOL: Alcohol, Ethyl (B): <15 mg/dL

## 2024-10-22 LAB — APTT: aPTT: 34 s (ref 24–36)

## 2024-10-22 MED ORDER — SUGAMMADEX SODIUM 200 MG/2ML IV SOLN
INTRAVENOUS | Status: DC | PRN
  Administered 2024-10-22: 200 mg via INTRAVENOUS

## 2024-10-22 MED ORDER — ACETAMINOPHEN 325 MG PO TABS: 650.0000 mg | ORAL_TABLET | ORAL | Status: DC | PRN

## 2024-10-22 MED ORDER — PHENYLEPHRINE HCL-NACL 20-0.9 MG/250ML-% IV SOLN
INTRAVENOUS | Status: DC | PRN
  Administered 2024-10-22: 40 ug/min via INTRAVENOUS

## 2024-10-22 MED ORDER — LABETALOL HCL 5 MG/ML IV SOLN: 10.0000 mg | Freq: Once | INTRAVENOUS | Status: DC | PRN

## 2024-10-22 MED ORDER — ONDANSETRON HCL 4 MG/2ML IJ SOLN
INTRAMUSCULAR | Status: DC | PRN
  Administered 2024-10-22: 4 mg via INTRAVENOUS

## 2024-10-22 MED ORDER — SODIUM CHLORIDE 0.9 % IV SOLN: INTRAVENOUS | Status: DC

## 2024-10-22 MED ORDER — DEXAMETHASONE SOD PHOSPHATE PF 10 MG/ML IJ SOLN
INTRAMUSCULAR | Status: DC | PRN
  Administered 2024-10-22: 10 mg via INTRAVENOUS

## 2024-10-22 MED ORDER — NICARDIPINE HCL IN NACL 20-0.86 MG/200ML-% IV SOLN: 0.0000 mg/h | INTRAVENOUS | Status: DC | PRN

## 2024-10-22 MED ORDER — SODIUM CHLORIDE 0.9 % IV BOLUS: 250.0000 mL | INTRAVENOUS | Status: AC | PRN

## 2024-10-22 MED ORDER — IOHEXOL 350 MG/ML SOLN
100.0000 mL | Freq: Once | INTRAVENOUS | Status: AC | PRN
  Administered 2024-10-22: 100 mL via INTRAVENOUS

## 2024-10-22 MED ORDER — FENTANYL CITRATE (PF) 250 MCG/5ML IJ SOLN
INTRAMUSCULAR | Status: DC | PRN
  Administered 2024-10-22: 100 ug via INTRAVENOUS

## 2024-10-22 MED ORDER — LABETALOL HCL 5 MG/ML IV SOLN: 10.0000 mg | INTRAVENOUS | Status: DC | PRN

## 2024-10-22 MED ORDER — CLEVIDIPINE BUTYRATE 0.5 MG/ML IV EMUL: 0.0000 mg/h | INTRAVENOUS | Status: DC

## 2024-10-22 MED ORDER — LIDOCAINE 2% (20 MG/ML) 5 ML SYRINGE
INTRAMUSCULAR | Status: DC | PRN
  Administered 2024-10-22: 60 mg via INTRAVENOUS

## 2024-10-22 MED ORDER — SODIUM CHLORIDE 0.9% FLUSH: 3.0000 mL | Freq: Once | INTRAVENOUS | Status: DC

## 2024-10-22 MED ORDER — EPHEDRINE SULFATE-NACL 50-0.9 MG/10ML-% IV SOSY
PREFILLED_SYRINGE | INTRAVENOUS | Status: DC | PRN
  Administered 2024-10-22: 5 mg via INTRAVENOUS

## 2024-10-22 MED ORDER — IOHEXOL 300 MG/ML  SOLN
150.0000 mL | Freq: Once | INTRAMUSCULAR | Status: AC | PRN
  Administered 2024-10-22: 40 mL via INTRA_ARTERIAL

## 2024-10-22 MED ORDER — ACETAMINOPHEN 650 MG RE SUPP: 650.0000 mg | RECTAL | Status: DC | PRN

## 2024-10-22 MED ORDER — PANTOPRAZOLE SODIUM 40 MG IV SOLR
40.0000 mg | Freq: Every day | INTRAVENOUS | Status: DC
  Administered 2024-10-22: 40 mg via INTRAVENOUS
  Filled 2024-10-22: qty 10

## 2024-10-22 MED ORDER — STROKE: EARLY STAGES OF RECOVERY BOOK
Freq: Once | Status: AC
  Filled 2024-10-22: qty 1

## 2024-10-22 MED ORDER — PERFLUTREN LIPID MICROSPHERE
1.0000 mL | INTRAVENOUS | Status: AC | PRN
  Administered 2024-10-22: 6 mL via INTRAVENOUS

## 2024-10-22 MED ORDER — PHENYLEPHRINE 80 MCG/ML (10ML) SYRINGE FOR IV PUSH (FOR BLOOD PRESSURE SUPPORT)
PREFILLED_SYRINGE | INTRAVENOUS | Status: DC | PRN
  Administered 2024-10-22 (×2): 160 ug via INTRAVENOUS

## 2024-10-22 MED ORDER — LACTATED RINGERS IV SOLN: INTRAVENOUS | Status: DC | PRN

## 2024-10-22 MED ORDER — PROPOFOL 10 MG/ML IV BOLUS
INTRAVENOUS | Status: DC | PRN
  Administered 2024-10-22: 140 mg via INTRAVENOUS

## 2024-10-22 MED ORDER — ACETAMINOPHEN 160 MG/5ML PO SOLN: 650.0000 mg | ORAL | Status: DC | PRN

## 2024-10-22 MED ORDER — ERYTHROMYCIN 5 MG/GM OP OINT
1.0000 | TOPICAL_OINTMENT | Freq: Three times a day (TID) | OPHTHALMIC | Status: DC
  Administered 2024-10-22 – 2024-10-24 (×6): 1 via OPHTHALMIC
  Filled 2024-10-22 (×2): qty 1

## 2024-10-22 MED ORDER — FENTANYL CITRATE (PF) 100 MCG/2ML IJ SOLN
INTRAMUSCULAR | Status: AC
  Filled 2024-10-22: qty 2

## 2024-10-22 MED ORDER — ROCURONIUM BROMIDE 10 MG/ML (PF) SYRINGE
PREFILLED_SYRINGE | INTRAVENOUS | Status: DC | PRN
  Administered 2024-10-22: 80 mg via INTRAVENOUS

## 2024-10-22 MED ORDER — IOHEXOL 350 MG/ML SOLN
75.0000 mL | Freq: Once | INTRAVENOUS | Status: AC | PRN
  Administered 2024-10-22: 75 mL via INTRAVENOUS

## 2024-10-22 MED ORDER — TENECTEPLASE 25 MG IV KIT
0.2500 mg/kg | PACK | Freq: Once | INTRAVENOUS | Status: AC
  Administered 2024-10-22: 24 mg via INTRAVENOUS
  Filled 2024-10-22: qty 5

## 2024-10-22 MED ORDER — ROSUVASTATIN CALCIUM 20 MG PO TABS
20.0000 mg | ORAL_TABLET | Freq: Every day | ORAL | Status: DC
  Administered 2024-10-23 – 2024-10-24 (×2): 20 mg via ORAL
  Filled 2024-10-22 (×2): qty 1

## 2024-10-22 NOTE — Progress Notes (Signed)
"  CTA is being performed at this time. "

## 2024-10-22 NOTE — Progress Notes (Signed)
 PHARMACIST CODE STROKE RESPONSE  Notified to mix TNK at 0644 by Dr. Michaela TNK preparation completed at 0646  TNK dose = 24 mg IV over 5 seconds.   Issues/delays encountered (if applicable):   Lynwood Poplar, PharmD, BCPS Clinical Pharmacist 10/22/2024 6:46 AM

## 2024-10-22 NOTE — Anesthesia Postprocedure Evaluation (Signed)
"   Anesthesia Post Note  Patient: Steve [name]  Procedure(s) Performed: RADIOLOGY WITH ANESTHESIA     Patient location during evaluation: PACU Anesthesia Type: General Level of consciousness: awake and patient cooperative Pain management: pain level controlled Vital Signs Assessment: post-procedure vital signs reviewed and stable Respiratory status: spontaneous breathing, nonlabored ventilation and respiratory function stable Cardiovascular status: blood pressure returned to baseline and stable Postop Assessment: no apparent nausea or vomiting Anesthetic complications: no   There were no known notable events for this encounter.  Last Vitals:  Vitals:   10/22/24 1200 10/22/24 1230  BP: (!) 158/98 136/85  Pulse: 92 84  Resp: 20 13  Temp:    SpO2: 94% 93%    Last Pain:  Vitals:   10/22/24 1157  TempSrc: Axillary  PainSc:                  Debria Broecker      "

## 2024-10-22 NOTE — Progress Notes (Addendum)
 Same day note   Steve [name] is a 74 y.o. 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  He received TNK at 0647 this morning.  He had a blepharoplasty and ptosis repair on 1/6.  He did hold his aspirin  prior to that but has since resumed it.  At 8 AM he developed severe aphasia with NIH worsening from 3 to 8.  Stat head CT and CTA with perfusion completed due to LVO symptoms.  He was found to have a left M3 occlusion and was subsequently taken to IR for an emergent thrombectomy.  TICI2C revascularization of Left M3. Returned to 4N16 post intervention. Of note he does have bleeding from both eyes due to internal sutures. Surgeon contacted and she requested erythromycin  ointment which has been ordered.    Neuro: post intervention  Mental Status: Patient is awake, alert, oriented to person, place, month, year, and situation. Patient is able to give a clear and coherent history. No signs of neglect. Slight word finding difficulty.  Cranial Nerves: II: Visual Fields are full. Pupils are equal, round, and reactive to light.   III,IV, VI: EOMI without ptosis or diploplia.  V: Right facial asymmetry baseline VII: Facial movement is symmetric resting and smiling VIII: Hearing is intact to voice X: Palate elevates symmetrically XI: Shoulder shrug is symmetric. XII: Tongue protrudes midline without atrophy or fasciculations.  Motor: Tone is normal. Bulk is normal. 5/5 strength was present in all four extremities.  Sensory: Sensation is symmetric to light touch and temperature in the arms and legs. No extinction to DSS present. Cerebellar: FNF and HKS are intact bilaterally  1a Level of Conscious.: 0 1b LOC Questions: 0 1c LOC Commands: 0 2 Best Gaze: 0 3 Visual: 0 4 Facial Palsy: 1 5a Motor Arm - left: 0 5b Motor Arm - Right: 0 6a Motor Leg - Left: 0 6b  Motor Leg - Right: 0 7 Limb Ataxia: 0 8 Sensory: 0 9 Best Language: 0 10 Dysarthria: 0 11 Extinct. and Inatten.: 0 TOTAL: 1  Acute Ischemic Infarct: Suspect left frontal lobe infarct due to left M3 occlusion s/p mechanical thrombectomy and TNK with TICI 2c revascularization Etiology: Likely embolic from ulcerated plaque and moderate proximal left carotid stenosis code Stroke CT head No acute abnormality. ASPECTS 10.    CTA head & neck moderate calcific and soft plaque in the left carotid bulb with questionable intraluminal thrombus of the left ICA Repeat CT head-no acute intracranial abnormality CT perfusion new focal occlusion of M3 branch.  8 mL core, 23 mL penumbra MRI pending 2D Echo EF 60 to 65% LDL 77 HgbA1c 6.2 VTE prophylaxis -SCDs aspirin  325 mg daily prior to admission, now on no antithrombotic until 24 hours post TNK Therapy recommendations:  Pending Disposition:  pending   Hx of Stroke/TIA 2022- Dr. Sheliah at [facility] - Hx L MCA infarct s/p PFO closure. Subsequently he had MCA infarction that appeared cryptogenic as he had no significant carotid artery stenosis however plaque protocol showed that he had soft plaque in the left carotid bulb. Recommended ASA 325mg  daily, crestor  20mg    Hyperlipidemia Home meds:  Crestor  20mg , resumed in hospital LDL 77, goal < 70 Increase to crestor  40mg  Continue statin at discharge  Other Stroke Risk Factors ETOH use, alcohol level <15, advised to drink no more than 2 drink(s) a day  Other  Active Problems S.p bilateral blepharoplasty and right ptosis repair  Bleeding from eyes due to internal sutures Erythromycin  ordered per Dr. Geofm Mel[employer] day # 0   Patient seen and examined by NP/APP with MD. MD to update note as needed.   Jorene Last, DNP, FNP-BC [facility] Pager: [phone]  I have personally obtained history,examined this patient, reviewed notes, independently viewed imaging studies,  participated in medical decision making and plan of care.ROS completed by me personally and pertinent positives fully documented  I have made any additions or clarifications directly to the above note. Agree with note above.  Patient presented with aphasia and right facial weakness.  Treated with IV TNK initially improved and subsequently during a.m. rounds had sudden onset of muteness and worsening aphasia.  Repeat CTA and CT perfusion showed left M3 occlusion with small PTT of 40 infarct and 25 cc penumbra.  After discussion of risk benefits and alternatives with the patient and his wife son he underwent him mechanical thrombectomy TICI 2c revascularization of the occluded left M2 branch.  Patient aphasia has improved significantly.  Continue close neurological observation and strict blood pressure control as per post TNK and fundectomy protocol.  Check MRI scan of the brain will likely need dual antiplatelet therapy after 24 hours post TNK.  Mobilize out of bed.  Therapy consults.  Continue ongoing stroke workup.  Since patient has had her second episode of stroke from this nonobstructive plaque in the left carotid we discussed with neurointerventionist whether he need left carotid stenting to be done electively to reduce recurrent stroke risk. This patient is critically ill and at significant risk of neurological worsening, death and care requires constant monitoring of vital signs, hemodynamics,respiratory and cardiac monitoring, extensive review of multiple databases, frequent neurological assessment, discussion with family, other specialists and medical decision making of high complexity.I have made any additions or clarifications directly to the above note.This critical care time does not reflect procedure time, or teaching time or supervisory time of PA/NP/Med Resident etc but could involve care discussion time.  I spent 50 minutes of neurocritical care time  in the care of  this patient.     Eather Popp, MD Medical Director [facility] Pager: [phone] 10/22/2024 3:21 PM  To contact Stroke Continuity provider, please refer to [email or web address]. After hours, contact General Neurology

## 2024-10-22 NOTE — Anesthesia Preprocedure Evaluation (Signed)
 "                                  Anesthesia Evaluation  Patient identified by MRN, date of birth, ID band Patient awake    Reviewed: Allergy & Precautions, NPO status , Patient's Chart, lab work & pertinent test results  History of Anesthesia Complications Negative for: history of anesthetic complications  Airway Mallampati: III  TM Distance: >3 FB Neck ROM: Full    Dental  (+) Teeth Intact, Dental Advisory Given   Pulmonary neg shortness of breath, sleep apnea , neg COPD, neg recent URI, former smoker   breath sounds clear to auscultation       Cardiovascular + dysrhythmias  Rhythm:Regular Rate:Tachycardia  1. Left ventricular ejection fraction, by estimation, is 60 to 65%. The  left ventricle has normal function. The left ventricle has no regional  wall motion abnormalities.   2. Right ventricular systolic function is normal. The right ventricular  size is normal.   3. No left atrial/left atrial appendage thrombus was detected.   4. There is an Amplatzer closure device in place with no evidence of  atrial shunting or thrombus. Agitated saline contrast bubble study was  negative, with no evidence of any interatrial shunt.   5. The mitral valve is normal in structure. Trivial mitral valve  regurgitation. No evidence of mitral stenosis.   6. The aortic valve is tricuspid. Aortic valve regurgitation is not  visualized. No aortic stenosis is present.   7. The inferior vena cava is normal in size with greater than 50%  respiratory variability, suggesting right atrial pressure of 3 mmHg.      Neuro/Psych TIA Neuromuscular disease CVA, Residual Symptoms    GI/Hepatic Neg liver ROS, PUD,GERD  ,,  Endo/Other  negative endocrine ROS    Renal/GU negative Renal ROSLab Results      Component                Value               Date                      NA                       139                 10/22/2024                K                        4.4                  10/22/2024                CO2                      25                  10/22/2024                GLUCOSE                  128 (H)             10/22/2024  BUN                      16                  10/22/2024                CREATININE               1.00                10/22/2024                CALCIUM                   9.2                 10/22/2024                GFR                      69.20               05/26/2024                EGFR                     82                  08/02/2021                GFRNONAA                 >60                 10/22/2024                Musculoskeletal  (+) Arthritis ,    Abdominal   Peds  Hematology negative hematology ROS (+)   Anesthesia Other Findings   Reproductive/Obstetrics                              Anesthesia Physical Anesthesia Plan  ASA: 3 and emergent  Anesthesia Plan: General   Post-op Pain Management: Minimal or no pain anticipated   Induction: Intravenous  PONV Risk Score and Plan: 2 and Ondansetron  and Dexamethasone   Airway Management Planned: Oral ETT  Additional Equipment: None  Intra-op Plan:   Post-operative Plan: Extubation in OR  Informed Consent: I have reviewed the patients History and Physical, chart, labs and discussed the procedure including the risks, benefits and alternatives for the proposed anesthesia with the patient or authorized representative who has indicated his/her understanding and acceptance.     Dental advisory given  Plan Discussed with: CRNA  Anesthesia Plan Comments:          Anesthesia Quick Evaluation  "

## 2024-10-22 NOTE — Progress Notes (Signed)
 Pt is bleeding from both eyes. Dr. Rosemarie notified. Will continue to monitor.

## 2024-10-22 NOTE — Consult Note (Addendum)
 He is 74 years old and right-handed.  He was admitted this morning with some right facial weakness, mild right arm weakness and mild aphasia.  The aphasia has been waxing and waning, at times mute.  I reviewed his CT and CTA.  The CTA shows a large M3 branch occlusion in the frontal lobe.  The CT perfusion suggest only an 8 mL core.  The patient is no longer mute, but still having significant expressive aphasia.  Reception seems grossly normal.  While now supine, with a blood pressure of 160, he is less symptomatic.  I had a discussion with the patient himself, his son and wife.  I have explained that this stroke will not be fatal.  It is likely to cause some language disability.  That may improve with time and rehabilitation.  I have explained the option of a thrombectomy.  The risk of a thrombectomy in smaller vessels is a little higher than in larger vessels, and there is a chance that he could have a complication from the procedure such as a hemorrhage which could make it significantly worse.  The advantage of the thrombectomy is that it may well limit the size of the stroke and he may have a better outcome in terms of his language.  Having reviewed the images, I do think a thrombectomy is feasible.  They have considered the above and have elected to proceed with a thrombectomy.

## 2024-10-22 NOTE — Progress Notes (Signed)
 Patient in IR suite from 0911 to 1050. All NIH charting and vascular check completed when patient returned to 4[facility]

## 2024-10-22 NOTE — Transfer of Care (Signed)
 Immediate Anesthesia Transfer of Care Note  Patient: Steve [name]  Procedure(s) Performed: RADIOLOGY WITH ANESTHESIA  Patient Location: PACU  Anesthesia Type:General  Level of Consciousness: oriented, drowsy, and patient cooperative  Airway & Oxygen Therapy: Patient Spontanous Breathing  Post-op Assessment: Report given to RN, Post -op Vital signs reviewed and stable, and Patient moving all extremities X 4  Post vital signs: Reviewed and stable  Last Vitals:  Vitals Value Taken Time  BP 165/87 10/22/24 10:09  Temp    Pulse 90 10/22/24 10:11  Resp 15 10/22/24 10:11  SpO2 96 % 10/22/24 10:11  Vitals shown include unfiled device data.  Last Pain:  Vitals:   10/22/24 0753  TempSrc:   PainSc: 0-No pain         Complications: There were no known notable events for this encounter.

## 2024-10-22 NOTE — Progress Notes (Signed)
"  Pharmcist preparing TNKase  24mg  after Dr. Michaela informed pt of benefits and risk of medication "

## 2024-10-22 NOTE — Brief Op Note (Signed)
" °  NEUROSURGERY BRIEF THROMBECTOMY NOTE   PREOP DX: Left M3 embolus  POSTOP DX: Same  PROCEDURE: embolectomy  SURGEON: Nancyann LULLA Burns   ANESTHESIA: GETA  EBL: Minimal  Number of Passes: 1  Technique: ASPIRATION  Final TICI score: 2C  Post OP blood pressure goal: SBP<160  Arterial Angioplasty or Stent: No   Anti-Platelet Therapy: No   COMPLICATIONS: No   CONDITION: Stable to recovery  FINDINGS (Full report in [facility]): 1. Left M3 frontal lobe embolus, TICI 2c post   Nancyann LULLA Burns  @today @ 9:44 AM  "

## 2024-10-22 NOTE — Progress Notes (Addendum)
 Shortly after arrival to [facility] (0808), Pt began having difficulty speaking.NIH from 3- 8. Dr Rosemarie notified. Pt returned to CT for imaging. (08:20).Per Dr Rosemarie, pt has LVO. Dr Lester to bedside, code IR activated. [phone]) Pt taken to PM,(9088) report handoff to [facility].

## 2024-10-22 NOTE — H&P (Addendum)
 NEUROLOGY H&P NOTE   Date of service: October 22, 2024 Patient Name: Steve [name] MRN:  [id number] DOB:  20-Jul-1951 Chief Complaint: right hand weakness  History of Present Illness  Steve [name] is a 74 y.o. male with hx of multiple previous strokes who presents with right hand and face numbness and weakness.  He has a history of right hemifacial spasm and gets Botox injections, but thinks that his right facial weakness is worse than typical, and he also notices significant difficulty using his right hand and therefore presented to the emergency department as a code stroke with prenotification and I was waiting for him on arrival.  He was last definitely normal when he got up to go to the bathroom at 3:30 AM and then noticed symptoms on awakening subsequently.  He does have a history of previous strokes, at least one of which was considered cryptogenic and it was noted that he did have some soft plaque in the left carotid bulb.  He reports he was told 35%, though the report does not mention a specific percentage.  Last known well: 3:30 AM Modified rankin score: 1-No significant post stroke disability and can perform usual duties with stroke symptoms IV Thrombolysis: Yes Thrombectomy: No, no LVO  NIHSS components Score: Comment  1a Level of Conscious 0[x]  1[]  2[]  3[]      1b LOC Questions 0[x]  1[]  2[]       1c LOC Commands 0[x]  1[]  2[]       2 Best Gaze 0[x]  1[]  2[]       3 Visual 0[x]  1[]  2[]  3[]      4 Facial Palsy 0[]  1[]  2[x]  3[]      5a Motor Arm - left 0[x]  1[]  2[]  3[]  4[]  UN[]    5b Motor Arm - Right 0[]  1[x]  2[]  3[]  4[]  UN[]    6a Motor Leg - Left 0[x]  1[]  2[]  3[]  4[]  UN[]    6b Motor Leg - Right 0[x]  1[]  2[]  3[]  4[]  UN[]    7 Limb Ataxia 0[x]  1[]  2[]  UN[]      8 Sensory 0[]  1[x]  2[]  UN[]      9 Best Language 0[x]  1[]  2[]  3[]      10 Dysarthria 0[]  1[x]  2[]  UN[]      11 Extinct. 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   Sleep apnea    Resolved   Squamous cell skin cancer    Dr.King [place]   Stricture esophagus 2013    dilation X 1   Stroke (HCC) 2016   left semiovale stroke    TIA (transient ischemic attack) 01/2015   Ulcerative esophagitis 2013   Past Surgical History:  Procedure Laterality Date   BUBBLE STUDY  08/11/2021   Procedure: BUBBLE STUDY;  Surgeon: Raford Riggs, MD;  Location: [facility] ENDOSCOPY;  Service: Cardiovascular;;   COLONOSCOPY W/ POLYPECTOMY  2013, 2018 last done   tubular adenoma   COLONOSCOPY W/ POLYPECTOMY  2008   hyperplastic   CYSTOSCOPY N/A 09/19/2020   Procedure: CYSTOSCOPY FLEXIBLE;  Surgeon: Matilda Senior, MD;  Location: [facility]  Service: Urology;  Laterality: N/A;   EP IMPLANTABLE DEVICE N/A 01/03/2016   Procedure: Loop Recorder Insertion;  Surgeon: Jerel Balding, MD;  Location: [facility] INVASIVE CV LAB;  Service: Cardiovascular;  Laterality: N/A;   MOHS SURGERY     x 3   NASAL SINUS SURGERY  15 yrs ago   PATENT FORAMEN OVALE(PFO) CLOSURE N/A 03/27/2018   Procedure: PATENT FORAMEN OVALE (PFO) CLOSURE;  Surgeon: Wonda Sharper, MD;  Location: [facility] INVASIVE CV LAB;  Service: Cardiovascular;  Laterality: N/A;   PROSTATE BIOPSY  2021   RADIOACTIVE SEED IMPLANT N/A 09/19/2020   Procedure: RADIOACTIVE SEED IMPLANT/BRACHYTHERAPY IMPLANT;  Surgeon: Matilda Senior, MD;  Location: [facility];  Service: Urology;  Laterality: N/A;  90 MINS   REVERSE  SHOULDER ARTHROPLASTY Right 03/10/2019   Procedure: REVERSE SHOULDER ARTHROPLASTY;  Surgeon: Josefina Chew, MD;  Location: [facility] ORS;  Service: Orthopedics;  Laterality: Right;   ROTATOR CUFF REPAIR Left yrs ago   SPACE OAR INSTILLATION N/A 09/19/2020   Procedure: SPACE OAR INSTILLATION;  Surgeon: Matilda Senior, MD;  Location: [facility];  Service: Urology;  Laterality: N/A;   TEE WITHOUT CARDIOVERSION N/A 01/06/2018   Procedure: TRANSESOPHAGEAL ECHOCARDIOGRAM (TEE);  Surgeon: Balding Jerel, MD;  Location: [facility] ENDOSCOPY;  Service: Cardiovascular;  Laterality: N/A;   TEE WITHOUT CARDIOVERSION N/A 08/11/2021   Procedure: TRANSESOPHAGEAL ECHOCARDIOGRAM (TEE);  Surgeon: Raford Riggs, MD;  Location: [facility] ENDOSCOPY;  Service: Cardiovascular;  Laterality: N/A;   torn biceps tendon Left yrs ago   surgical repair   uvulectomy & tonsillectomy  2008   Dr Mable   Family History  Problem Relation Age of Onset   Colon polyps Mother    Colon cancer Mother    Uterine cancer Mother    Breast cancer Mother        Deceased, 63   Stroke Father        > 62   Prostate cancer Father        Deceased, 48   Healthy Brother    Stroke Paternal Uncle        > 55   Heart attack Maternal Grandfather         >55/mid-60s   Diabetes Daughter        type 1   Hyperlipidemia Son    Rectal cancer Neg Hx    Stomach cancer Neg Hx    Esophageal cancer Neg Hx    Social History   Socioeconomic History   Marital status: Married    Spouse name: Not on file   Number of children: Not on file   Years of education: Not on file   Highest education level: Bachelor's degree (e.g., BA, AB, BS)  Occupational History   Occupation: PRESIDENT OF [employer]  Tobacco Use   Smoking status: Former    Current packs/day: 0.00    Average packs/day: 0.5 packs/day for 10.0 years (5.0 ttl pk-yrs)    Types: Cigarettes    Start date: 54    Quit date: 1983    Years since quitting: 43.0   Smokeless  tobacco: Never   Tobacco comments:    smoked 1972-1982, up to 1/2 ppd  Vaping Use   Vaping status: Never Used  Substance and Sexual Activity   Alcohol use: Yes    Alcohol/week: 5.0 - 6.0 standard drinks of alcohol    Types: 5 - 6 Standard drinks or equivalent per week    Comment:  2 -3 drinks per week   Drug use: No   Sexual activity: Not Currently  Other Topics Concern   Not on file  Social History Narrative   GETS REG EXERCISE 4x weekly   NO DIET   Lives with wife in a 2 story home.  Has 2 children.     Works as a special educational needs teacher.     Education: college   Right Handed   Drinks Caffeine    Lives in a two story home   Social Drivers of Health   Tobacco Use: Medium Risk (08/20/2024)   Received from [facility]   Patient History    Smoking Tobacco Use: Former    Smokeless Tobacco Use: Never    Passive Exposure: Not on file  Financial Resource Strain: Low Risk (05/25/2024)   Overall Financial Resource Strain (CARDIA)    Difficulty of Paying Living Expenses: Not hard at all  Food Insecurity: No Food Insecurity (05/25/2024)   Epic    Worried About Programme Researcher, Broadcasting/film/video in the Last Year: Never true    Ran Out of Food in the Last Year: Never true  Transportation Needs: No Transportation Needs (05/25/2024)   Epic    Lack of Transportation (Medical): No    Lack of Transportation (Non-Medical): No  Physical Activity: Sufficiently Active (05/25/2024)   Exercise Vital Sign    Days of Exercise per Week: 6 days    Minutes of Exercise per Session: 50 min  Stress: No Stress Concern Present (05/25/2024)   [employer] of Occupational Health - Occupational Stress Questionnaire    Feeling of Stress: Not at all  Social Connections: Socially Integrated (05/25/2024)   Social Connection and Isolation Panel    Frequency of Communication with Friends and Family: More than three times a week    Frequency of Social Gatherings with Friends and Family: More than three times a week    Attends  Religious Services: More than 4 times per year    Active Member of Clubs or Organizations: Yes    Attends Banker Meetings: More than 4 times per year    Marital Status: Married  Depression (PHQ2-9): Low Risk (05/26/2024)   Depression (PHQ2-9)    PHQ-2 Score: 0  Alcohol Screen: Low Risk (05/25/2024)   Alcohol Screen    Last Alcohol Screening Score (AUDIT): 3  Housing: Unknown (05/25/2024)   Epic    Unable to Pay for Housing in the Last Year: No    Number of Times Moved in the Last Year: Not on file    Homeless in the Last Year: No  Utilities: Not At Risk (04/26/2024)   Received [facility]    In the past 12 months has the electric, gas, oil, or water  company threatened to shut off services in your home?: No  Health Literacy: Adequate Health Literacy (05/25/2024)   B1300 Health Literacy    Frequency of need for help with medical instructions: Never   Allergies[1]  Medications  Current Medications[2]   Vitals   Vitals:   10/22/24 0600 10/22/24 0650  BP:  (!) 163/97  Pulse:  91  Resp:  18  Weight: 95 kg   Height: 6' 3 (1.905 m)      Body mass index  is 26.19 kg/m.  Physical Exam   Constitutional: Appears well-developed and well-nourished.  Psych: Affect appropriate to situation.  Eyes: No scleral injection.  HENT: No OP obstruction.  Head: Normocephalic.  Cardiovascular: Normal rate and regular rhythm.  Respiratory: Effort normal, non-labored breathing.  GI: Soft.  No distension. There is no tenderness.  Skin: WDI.   Neurologic Examination    Neuro: Mental Status: Patient is awake, alert, oriented to person, place, month, year, and situation. Patient is able to give a clear and coherent history. No signs of aphasia or neglect Cranial Nerves: II: Visual Fields are full. Pupils are equal, round, and reactive to light.   III,IV, VI: EOMI without ptosis or diploplia.  V: Facial sensation is diminished in the right face VII: Facial  movement is weak on the right VIII: hearing is intact to voice X: Uvula elevates symmetrically XII: tongue is midline without atrophy or fasciculations.  Motor: He has very mild drift of the right upper extremity, but has significant impairment of his right hand and is unable to oppose individual fingers.  He has significant wrist weakness as well, good strength in the right leg and entire left side Sensory: Sensation is diminished in the right arm but not leg Cerebellar: No clear ataxia       Labs   CBC:  Recent Labs  Lab 10/22/24 0633 10/22/24 0636  WBC  --  5.9  NEUTROABS  --  2.9  HGB 15.6 15.6  HCT 46.0 44.8  MCV  --  92.2  PLT  --  186   Basic Metabolic Panel:  Lab Results  Component Value Date   NA 141 10/22/2024   K 4.2 10/22/2024   CO2 29 05/26/2024   GLUCOSE 127 (H) 10/22/2024   BUN 17 10/22/2024   CREATININE 1.00 10/22/2024   CALCIUM  9.5 05/26/2024   GFRNONAA >60 07/28/2021   GFRAA >60 03/11/2019   Lipid Panel:  Lab Results  Component Value Date   LDLCALC 77 08/25/2024   HgbA1c:  Lab Results  Component Value Date   HGBA1C 6.2 05/26/2024    INR  Lab Results  Component Value Date   INR 1.0 10/22/2024   APTT  Lab Results  Component Value Date   APTT 34 10/22/2024     CT Head without contrast(Personally reviewed): Negative  CT angio Head and Neck with contrast(Personally reviewed): Negative for LVO, but he does have significant plaque in the carotid bulb  Assessment   Steve [name] is a 74 y.o. male with right hand weakness in the setting of holding aspirin  for surgery.  He does have significant appearing stenosis of the left carotid, and I do wonder if this is the etiology.  Primary Diagnosis:  Cerebral infarction due to embolism of  left middle cerebral artery.    Recommendations  - Maintain BP less than 180/105 - Admit to ICU for post TNK monitoring - HgbA1c, fasting lipid panel - MRI of the brain without contrast -  Frequent neuro checks - Echocardiogram - Prophylactic therapy-none for 24 hours - Risk factor modification - Telemetry monitoring - PT consult, OT consult, Speech consult - Stroke team to follow  ______________________________________________________________________   Signed, Aisha Seals, MD [facility]  Risks, benefits and alternatives of IVT discussed with patient and/or family and they agreed. This includes the risk of bleeding and even possibl eimpairment of vision due to bleeding in the surgical site, but given relatively minor nature of the surgery, don't feel this is an absolute  contraindication.   CTH personally reviewed prior to TNK administration  This patient is critically ill and at significant risk of neurological worsening, death and care requires constant monitoring of vital signs, hemodynamics,respiratory and cardiac monitoring, neurological assessment, discussion with family, other specialists and medical decision making of high complexity. I spent 50 minutes of neurocritical care time  in the care of  this patient. This was time spent independent of any time provided by nurse practitioner or PA.  Aisha Seals, MD [facility]   If 7pm- 7am, please page neurology on call as listed in AMION. 10/22/2024  7:15 AM      [1]  Allergies Allergen Reactions   Bee Pollen Itching    Eyes itch and water    Pollen Extract Itching    Eyes itch and water   [2]  Current Facility-Administered Medications:     stroke: early stages of recovery book, , Does not apply, Once, Seals Aisha SQUIBB, MD   0.9 %  sodium chloride  infusion, , Intravenous, Continuous, Quinci Gavidia, Aisha SQUIBB, MD   acetaminophen  (TYLENOL ) tablet 650 mg, 650 mg, Oral, Q4H PRN **OR** acetaminophen  (TYLENOL ) 160 MG/5ML solution 650 mg, 650 mg, Per Tube, Q4H PRN **OR** acetaminophen  (TYLENOL ) suppository 650 mg, 650 mg, Rectal, Q4H PRN, Seals Aisha SQUIBB, MD    labetalol  (NORMODYNE ) injection 10 mg, 10 mg, Intravenous, Once PRN **AND** nicardipine  (CARDENE ) 20mg  in 0.86% saline IV infusion (0.1 mg/ml), 0-15 mg/hr, Intravenous, Continuous PRN, Seals Aisha SQUIBB, MD   lidocaine -EPINEPHrine  (XYLOCAINE  W/EPI) 2 %-1:100000 (with pres) injection 10 mL, 10 mL, Intradermal, Once, Croitoru, Mihai, MD   pantoprazole  (PROTONIX ) injection 40 mg, 40 mg, Intravenous, QHS, Taijah Macrae, Aisha SQUIBB, MD   rosuvastatin  (CRESTOR ) tablet 20 mg, 20 mg, Oral, Daily, Kolten Ryback, Aisha SQUIBB, MD   sodium chloride  flush (NS) 0.9 % injection 3 mL, 3 mL, Intravenous, Once, Raford Lenis, MD   tenecteplase  (TNKASE ) injection for stroke 24 mg, 0.25 mg/kg, Intravenous, Once, Seals Aisha SQUIBB, MD  Current Outpatient Medications:    aspirin  325 MG tablet, Take 325 mg by mouth daily. 1 Tablet Daily in The Morning, Disp: , Rfl:    botulinum toxin Type A (BOTOX) 100 units SOLR injection, Inject 100 Units into the muscle every 3 (three) months., Disp: , Rfl:    cholecalciferol (VITAMIN D3) 25 MCG (1000 UNIT) tablet, Take 1,000 Units by mouth daily., Disp: , Rfl:    fluticasone  (FLONASE ) 50 MCG/ACT nasal spray, ONE SPRAY IN EACH NOSTRIL TWICE DAILY ASNEEDED -USE CROSSOVER TECHNIQUE DISCUSSED, Disp: 16 g, Rfl: 0   Multiple Vitamins-Minerals (PRESERVISION AREDS PO), Take 1 capsule by mouth 2 (two) times daily., Disp: , Rfl:    pantoprazole  (PROTONIX ) 40 MG tablet, TAKE ONE TABLET BY MOUTH EVERY DAY, Disp: 90 tablet, Rfl: 3   Polyethyl Glycol-Propyl Glycol (SYSTANE) 0.4-0.3 % GEL ophthalmic gel, Place 1 application into both eyes at bedtime., Disp: , Rfl:    rosuvastatin  (CRESTOR ) 20 MG tablet, TAKE ONE TABLET DAILY, Disp: 90 tablet, Rfl: 3   sildenafil (REVATIO) 20 MG tablet, Take 40 mg by mouth as needed. (Patient taking differently: Take 40 mg by mouth as needed. Taking 10 mg), Disp: , Rfl:    silodosin (RAPAFLO) 8 MG CAPS capsule, Take 8 mg by mouth daily with breakfast., Disp:  , Rfl:    SOLIFENACIN SUCCINATE PO, Take 10 mg by mouth daily., Disp: , Rfl:    vardenafil (LEVITRA) 20 MG tablet, Take 20 mg by mouth daily as needed for erectile dysfunction., Disp: , Rfl:  vitamin B-12 (CYANOCOBALAMIN ) 1000 MCG tablet, Take 1,000 mcg by mouth daily., Disp: , Rfl:    vitamin C (ASCORBIC ACID) 500 MG tablet, Take 500 mg by mouth daily., Disp: , Rfl:

## 2024-10-22 NOTE — Progress Notes (Signed)
 PT Cancellation Note  Patient Details Name: Steve [name] MRN: [id number] DOB: [birth year]   Cancelled Treatment:    Reason Eval/Treat Not Completed: Active bedrest order (Pt s/p TNK with active bedrest order. Will follow and attempt PT eval when medically appropriate.)  Adalin Vanderploeg W, PT, DPT Secure Chat Preferred  Rehab Office [phone]   Kate BRAVO Wendolyn 10/22/2024, 8:00 AM

## 2024-10-22 NOTE — Code Documentation (Addendum)
 Stroke Response Nurse Documentation Code Documentation  Steve [name] is a 74 y.o. 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 Code stroke was activated by EMS.   Patient from home where he was LKW at 0330 and now complaining of right sided weakness.   Stroke team at the bedside on patient arrival. Labs drawn and patient cleared for CT by Dr. Raford. Patient to CT with team. NIHSS 5, see documentation for details and code stroke times. Patient with right facial droop and right arm weakness, sensory change on right and dysartria on exam. The following imaging was completed:  CT Head and CTA. Patient is a candidate for IV Thrombolytic due to fixed neurological deficit. Patient is not a candidate for IR due to No LVO.   Care Plan: TNKase .    Bedside handoff with ED RN Ozell.    Griselda Alm ORN  Rapid Response RN

## 2024-10-22 NOTE — Progress Notes (Signed)
 At 0800 assessment with SRN NIH went from a 3-8. Stat CT done with changes noted. Patient taken to IR with this RN and SRN.

## 2024-10-22 NOTE — ED Provider Notes (Signed)
 " [place] EMERGENCY DEPARTMENT AT [facility] Provider Note   CSN: [id number] Arrival date & time: 10/22/24  9373     Patient presents with: Chief complaint: Code stroke   Steve [name] is a 74 y.o. male.   The history is provided by the patient.   He has history of hyperlipidemia, stroke, prostate cancer, right hemifacial spasm treated with Botox and comes in by ambulance as a code stroke.  He woke up at 3:30 AM and was at his baseline.  He woke up shortly before coming to the emergency department and noted that he was having difficulty using his right arm.  There has been no speech difficulty.  He did not notice any weakness in his right leg.  He denies any headache.  Of note, he is 9 days postop surgery on both eyelids, 6 weeks postop left shoulder arthroplasty.    Prior to Admission medications  Medication Sig Start Date End Date Taking? Authorizing Provider  aspirin  325 MG tablet Take 325 mg by mouth daily. 1 Tablet Daily in The Morning    [provider]  botulinum toxin Type A (BOTOX) 100 units SOLR injection Inject 100 Units into the muscle every 3 (three) months.    [provider]  cholecalciferol (VITAMIN D3) 25 MCG (1000 UNIT) tablet Take 1,000 Units by mouth daily.    [provider]  fluticasone  (FLONASE ) 50 MCG/ACT nasal spray ONE SPRAY IN EACH NOSTRIL TWICE DAILY ASNEEDED -USE CROSSOVER TECHNIQUE DISCUSSED 06/23/21   Geofm Glade PARAS, MD  Multiple Vitamins-Minerals (PRESERVISION AREDS PO) Take 1 capsule by mouth 2 (two) times daily. 05/02/20   [provider]  pantoprazole  (PROTONIX ) 40 MG tablet TAKE ONE TABLET BY MOUTH EVERY DAY 01/01/24   Geofm Glade PARAS, MD  Polyethyl Glycol-Propyl Glycol (SYSTANE) 0.4-0.3 % GEL ophthalmic gel Place 1 application into both eyes at bedtime.    [provider]  rosuvastatin  (CRESTOR ) 20 MG tablet TAKE ONE TABLET DAILY 09/09/24   Geofm Glade PARAS, MD  sildenafil (REVATIO) 20 MG tablet  Take 40 mg by mouth as needed. Patient taking differently: Take 40 mg by mouth as needed. Taking 10 mg    [provider]  silodosin (RAPAFLO) 8 MG CAPS capsule Take 8 mg by mouth daily with breakfast. 09/17/23   [provider]  SOLIFENACIN SUCCINATE PO Take 10 mg by mouth daily.    [provider]  vardenafil (LEVITRA) 20 MG tablet Take 20 mg by mouth daily as needed for erectile dysfunction.    [provider]  vitamin B-12 (CYANOCOBALAMIN ) 1000 MCG tablet Take 1,000 mcg by mouth daily.    [provider]  vitamin C (ASCORBIC ACID) 500 MG tablet Take 500 mg by mouth daily.    [provider]    Allergies: Bee pollen and Pollen extract    Review of Systems  All other systems reviewed and are negative.   Updated Vital Signs BP (!) 145/79 (BP Location: Right Arm)   Pulse 93   Temp 97.8 F (36.6 C) (Oral)   Resp 18   Ht 6' 3 (1.905 m)   Wt 95 kg   SpO2 93%   BMI 26.19 kg/m   Physical Exam Vitals and nursing note reviewed.   74 year old male, resting comfortably and in no acute distress. Vital signs are significant for elevated blood pressure. Oxygen saturation is 93%, which is normal. Head is normocephalic and atraumatic. PERRLA, EOMI. Oropharynx is clear.  Ecchymosis  is noted in the left periorbital area. Neck is nontender and supple without adenopathy.  There are no carotid bruits.. Lungs are clear without rales, wheezes, or rhonchi. Chest is nontender. Heart has regular rate and rhythm without murmur. Abdomen is soft, flat, nontender. Extremities have no cyanosis or edema, full range of motion is present. Skin is warm and dry without rash. Neurologic: Awake and alert and oriented, speech is normal.  There is right sided facial droop, tongue protrudes in the midline.  There is mild to moderate right hemiparesis.  Strength in right arm is 4/5, strength in right leg is 4+/5.  (all labs ordered are listed, but only abnormal  results are displayed) Labs Reviewed  I-STAT CHEM 8, ED - Abnormal; Notable for the following components:      Result Value   Glucose, Bld 127 (*)    Calcium , Ion 1.12 (*)    All other components within normal limits  CBG MONITORING, ED - Abnormal; Notable for the following components:   Glucose-Capillary 128 (*)    All other components within normal limits  PROTIME-INR  APTT  CBC  DIFFERENTIAL  COMPREHENSIVE METABOLIC PANEL WITH GFR  ETHANOL    EKG: None  Radiology: CT HEAD CODE STROKE WO CONTRAST Result Date: 10/22/2024 EXAM: CT HEAD WITHOUT CONTRAST 10/22/2024 06:37:00 AM TECHNIQUE: CT of the head was performed without the administration of intravenous contrast. Automated exposure control, iterative reconstruction, and/or weight based adjustment of the mA/kV was utilized to reduce the radiation dose to as low as reasonably achievable. COMPARISON: CT of the head dated 07/27/2021. CLINICAL HISTORY: Neuro deficit, acute, stroke suspected. FINDINGS: BRAIN AND VENTRICLES: No acute hemorrhage. No evidence of acute infarct. Focal encephalomalacia changes are noted within the left posterior frontal lobe from remote cortical infarct. No hydrocephalus. No extra-axial collection. No mass effect or midline shift. The findings were communicated to Doctor Michaela at 6:41 AM 10/22/2024. [place] Stroke Program Early CT Score (ASPECTS): Ganglionic (caudate, IC, lentiform nucleus, insula, M1-M3): 7 Supraganglionic (M4-M6): 3 Total: 10 ORBITS: No acute abnormality. SINUSES: No acute abnormality. SOFT TISSUES AND SKULL: No acute soft tissue abnormality. No skull fracture. IMPRESSION: 1. No acute intracranial abnormality in the setting of acute neurologic deficit/stroke suspected. 2. Focal encephalomalacia changes in the left posterior frontal lobe from remote cortical infarct. 3. ASPECTS: 10. Electronically signed by: Evalene Coho MD 10/22/2024 06:44 AM EST RP Workstation: [id number]     Procedures    Medications Ordered in the ED  sodium chloride  flush (NS) 0.9 % injection 3 mL (has no administration in time range)   stroke: early stages of recovery book (has no administration in time range)  0.9 %  sodium chloride  infusion (has no administration in time range)  acetaminophen  (TYLENOL ) tablet 650 mg (has no administration in time range)    Or  acetaminophen  (TYLENOL ) 160 MG/5ML solution 650 mg (has no administration in time range)    Or  acetaminophen  (TYLENOL ) suppository 650 mg (has no administration in time range)  labetalol  (NORMODYNE ) injection 10 mg (has no administration in time range)    And  nicardipine  (CARDENE ) 20mg  in 0.86% saline IV infusion (0.1 mg/ml) (has no administration in time range)  pantoprazole  (PROTONIX ) injection 40 mg (has no administration in time range)  tenecteplase  (TNKASE ) injection for stroke 24 mg (has no administration in time range)  rosuvastatin  (CRESTOR ) tablet 20 mg (has no administration in time range)  iohexol  (OMNIPAQUE ) 350 MG/ML injection 75 mL (75 mLs Intravenous Contrast Given 10/22/24 0639)  Medical Decision Making Amount and/or Complexity of Data Reviewed Labs: ordered. Radiology: ordered.  Risk Decision regarding hospitalization.   Stroke.  Right cranial nerve findings are superimposed on baseline Botox, but per patient his facial droop is more pronounced than his baseline.  He has arrived within the treatment window for thrombolytic therapy.  He is taken for emergent CT of head which shows no intracranial hemorrhage.  Discussion was held with the regarding risks and benefits of thrombolytic therapy and he has opted to proceed with thrombolytic therapy.  Patient was seen in conjunction with Dr. Michaela of neurology service who will assume his care at this point.  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  CRITICAL  CARE Performed by: Alm Lias Total critical care time: 35 minutes Critical care time was exclusive of separately billable procedures and treating other patients. Critical care was necessary to treat or prevent imminent or life-threatening deterioration. Critical care was time spent personally by me on the following activities: development of treatment plan with patient and/or surrogate as well as nursing, discussions with consultants, evaluation of patient's response to treatment, examination of patient, obtaining history from patient or surrogate, ordering and performing treatments and interventions, ordering and review of laboratory studies, ordering and review of radiographic studies, pulse oximetry and re-evaluation of patient's condition.     Final diagnoses:  Cerebrovascular accident (CVA), unspecified mechanism (HCC)  Elevated blood pressure reading without diagnosis of hypertension    ED Discharge Orders     None          Lias Alm, MD 10/22/24 2245  "

## 2024-10-22 NOTE — Plan of Care (Signed)
" °  Problem: Education: Goal: Knowledge of disease or condition will improve 10/22/2024 1328 by Orlando Aleck ORN, RN Outcome: Progressing 10/22/2024 0738 by Orlando Aleck ORN, RN Outcome: Progressing   Problem: Education: Goal: Knowledge of secondary prevention will improve (MUST DOCUMENT ALL) 10/22/2024 1328 by Orlando Aleck ORN, RN Outcome: Progressing 10/22/2024 0738 by Orlando Aleck ORN, RN Outcome: Progressing   Problem: Coping: Goal: Will verbalize positive feelings about self 10/22/2024 1328 by Orlando Aleck ORN, RN Outcome: Progressing 10/22/2024 0738 by Orlando Aleck ORN, RN Outcome: Progressing   Problem: Coping: Goal: Will identify appropriate support needs 10/22/2024 1328 by Orlando Aleck ORN, RN Outcome: Progressing 10/22/2024 0738 by Orlando Aleck ORN, RN Outcome: Progressing   Problem: Health Behavior/Discharge Planning: Goal: Goals will be collaboratively established with patient/family 10/22/2024 1328 by Orlando Aleck ORN, RN Outcome: Progressing 10/22/2024 0738 by Orlando Aleck ORN, RN Outcome: Progressing   Problem: Self-Care: Goal: Ability to participate in self-care as condition permits will improve 10/22/2024 1328 by Orlando Aleck ORN, RN Outcome: Progressing 10/22/2024 0738 by Orlando Aleck ORN, RN Outcome: Progressing   Problem: Self-Care: Goal: Verbalization of feelings and concerns over difficulty with self-care will improve 10/22/2024 1328 by Orlando Aleck ORN, RN Outcome: Progressing 10/22/2024 0738 by Orlando Aleck ORN, RN Outcome: Progressing   Problem: Nutrition: Goal: Risk of aspiration will decrease 10/22/2024 1328 by Orlando Aleck ORN, RN Outcome: Progressing 10/22/2024 0738 by Orlando Aleck ORN, RN Outcome: Progressing   Problem: Nutrition: Goal: Dietary intake will improve 10/22/2024 1328 by Orlando Aleck ORN, RN Outcome: Progressing 10/22/2024 0738 by Orlando Aleck ORN, RN Outcome:  Progressing   "

## 2024-10-22 NOTE — Anesthesia Procedure Notes (Signed)
 Procedure Name: Intubation Date/Time: 10/22/2024 9:17 AM  Performed by: Christopher Comings, CRNAPre-anesthesia Checklist: Patient identified, Emergency Drugs available, Suction available and Patient being monitored Patient Re-evaluated:Patient Re-evaluated prior to induction Oxygen Delivery Method: Circle system utilized Preoxygenation: Pre-oxygenation with 100% oxygen Induction Type: IV induction Ventilation: Mask ventilation without difficulty Laryngoscope Size: Mac and 4 Grade View: Grade III Tube type: Oral Tube size: 7.5 mm Number of attempts: 1 Airway Equipment and Method: Stylet and Oral airway Placement Confirmation: ETT inserted through vocal cords under direct vision, positive ETCO2 and breath sounds checked- equal and bilateral Secured at: 22 cm Tube secured with: Tape Dental Injury: Teeth and Oropharynx as per pre-operative assessment

## 2024-10-22 NOTE — Progress Notes (Signed)
 Dr. Lester notified of oozing at femoral site. He said to be expected since patient got TNK. Will continue to monitor, patient is resting comfortably.  Patient has eye surgery one week ago with notable swelling bilaterally. Jorene Last, NP called surgeon who performed procedure and got orders for antibiotic ointment. Family got the OK to bring in home eye ice packs. Ice packs placed in unit freezer with sticker on them.

## 2024-10-23 ENCOUNTER — Inpatient Hospital Stay (HOSPITAL_COMMUNITY)

## 2024-10-23 ENCOUNTER — Other Ambulatory Visit: Payer: Self-pay | Admitting: Neuroradiology

## 2024-10-23 ENCOUNTER — Telehealth: Payer: Self-pay | Admitting: Neurology

## 2024-10-23 ENCOUNTER — Encounter (HOSPITAL_COMMUNITY): Payer: Self-pay | Admitting: Radiology

## 2024-10-23 DIAGNOSIS — R29701 NIHSS score 1: Secondary | ICD-10-CM

## 2024-10-23 DIAGNOSIS — I10 Essential (primary) hypertension: Secondary | ICD-10-CM

## 2024-10-23 DIAGNOSIS — I63132 Cerebral infarction due to embolism of left carotid artery: Secondary | ICD-10-CM | POA: Diagnosis not present

## 2024-10-23 DIAGNOSIS — I6522 Occlusion and stenosis of left carotid artery: Secondary | ICD-10-CM

## 2024-10-23 DIAGNOSIS — I669 Occlusion and stenosis of unspecified cerebral artery: Secondary | ICD-10-CM

## 2024-10-23 DIAGNOSIS — H1130 Conjunctival hemorrhage, unspecified eye: Secondary | ICD-10-CM

## 2024-10-23 DIAGNOSIS — I6523 Occlusion and stenosis of bilateral carotid arteries: Secondary | ICD-10-CM | POA: Diagnosis not present

## 2024-10-23 DIAGNOSIS — E785 Hyperlipidemia, unspecified: Secondary | ICD-10-CM | POA: Diagnosis not present

## 2024-10-23 LAB — LIPID PANEL
Cholesterol: 133 mg/dL (ref 0–200)
HDL: 58 mg/dL
LDL Cholesterol: 62 mg/dL (ref 0–99)
Total CHOL/HDL Ratio: 2.3 ratio
Triglycerides: 64 mg/dL
VLDL: 13 mg/dL (ref 0–40)

## 2024-10-23 MED ORDER — ASPIRIN 81 MG PO CHEW
81.0000 mg | CHEWABLE_TABLET | Freq: Every day | ORAL | Status: DC
  Filled 2024-10-23: qty 1

## 2024-10-23 MED ORDER — CLOPIDOGREL BISULFATE 75 MG PO TABS
75.0000 mg | ORAL_TABLET | Freq: Every day | ORAL | Status: DC
  Administered 2024-10-23 – 2024-10-24 (×2): 75 mg via ORAL
  Filled 2024-10-23 (×2): qty 1

## 2024-10-23 MED ORDER — CHLORHEXIDINE GLUCONATE CLOTH 2 % EX PADS
6.0000 | MEDICATED_PAD | Freq: Every day | CUTANEOUS | Status: DC
  Administered 2024-10-23: 6 via TOPICAL

## 2024-10-23 MED ORDER — STUDY - LIBREXIA-STROKE - MILVEXIAN 25 MG OR PLACEBO TABLET (PI-SETHI)
1.0000 | ORAL_TABLET | Freq: Two times a day (BID) | ORAL | Status: DC
  Administered 2024-10-23 – 2024-10-24 (×3): 1 via ORAL
  Filled 2024-10-23 (×4): qty 1

## 2024-10-23 MED ORDER — ASPIRIN 81 MG PO CHEW
81.0000 mg | CHEWABLE_TABLET | Freq: Every day | ORAL | Status: DC
  Administered 2024-10-23 – 2024-10-24 (×2): 81 mg via ORAL
  Filled 2024-10-23: qty 1

## 2024-10-23 MED ORDER — PANTOPRAZOLE SODIUM 40 MG PO TBEC
40.0000 mg | DELAYED_RELEASE_TABLET | Freq: Every day | ORAL | Status: DC
  Administered 2024-10-23: 40 mg via ORAL
  Filled 2024-10-23: qty 1

## 2024-10-23 NOTE — Progress Notes (Signed)
 [facility] New Study Start: LIBREXIA-STROKE   SUMMARY For more information refer to: [email or web address] Study Identifier: [id number] A Phase 3, Randomized, Double-Blind, Parallel-Group, Placebo-Controlled Study to Demonstrate the Efficacy and Safety of Milvexian, an Oral Factor XIa Inhibitor, for Stroke Prevention after an Acute Ischemic Stroke or High-Risk Transient Ischemic Attack  Brief Summary This study will evaluate the efficacy and safety of milvexian in participants after an acute ischemic stroke or high-risk TIA who are receiving antiplatelet therapy standard-of-care.  Design Phase 3, Randomized, Double-Blind, Interventional, Event-Driven  Intervention [id number] (Milvexian) 25 mg or Placebo     Concomitant Therapy Participants will receive SAPT or DAPT. The SAPT or DAPT may be started prior to randomization and the type of antiplatelet agent(s), and duration of treatment will be at the discretion of the investigator. If ASA is used, it will be limited to low dose (75 to 100 mg/day) NSAID (except ASA) may be used concomitantly on a temporary basis but should be avoided for chronic use more than 4 weeks of consecutive therapy)  Prohibited Therapy Chronic (>4 weeks of consecutive use) use of ASA >100 mg per day Current or planned use of isoniazid Concomitant use of omeprazole  or esomeprazole with clopidogrel  is prohibited. Other use of PPI is allowed and encouraged Additional anticoagulants (e.g., vitamin k antagonists, factor IIa or FXa inhibitors) Use of a combined P-gp and strong CYP3A4 inhibitor (e.g., atazanavir, clarithromycin, itraconazole, ketoconazole, ritonavir, saquinavir) within 7 days of receiving study intervention and during the study is prohibited Use of a combined P-gp and strong CYP3A4 inducer (e.g., carbamazepine, phenytoin, rifampin) within 7 days of receiving study intervention and during the study is prohibited * Prohibited therapies  may be administered on a temporary basis, and if administered, the investigator should discontinue the study intervention. Study intervention may be restarted after the prohibited therapy has been discontinued and after the completion of a suitable washout period at the investigator's discretion  Anticoagulation Prophylaxis The use of anticoagulants for post-stroke DVT prophylaxis after the 3-day window is prohibited and non-pharmacological prophylaxis (e.g., intermittent pneumatic compression) is recommended.   Potential Drug-Drug Interactions Milvexian metabolism: Substrate of CYP3A4; use caution with coadministration of strong CYP3A4 inducers and inhibitors  Administration  Take 1 tablet by mouth twice daily without regards to food intake, at approximately the same time each day. For participants unable to swallow medication, the tablet can be dispersed in water  and given via NG tube or in applesauce.  Missed dose If a dose of medication is missed, the dose should be taken as soon as possible. If the missed dose cannot be taken at regular time, next dose should not be doubled. Resume at the next scheduled dose      Plan: Start [Milvexian 25 mg tablets or placebo] 10/23/24. Study medication must be picked up from pharmacy, medication can not be tubed.    Please contact [facility] if any questions or concerns regarding the study medication.    Edd Reppert,  PharmD, BCPS Investigational Drug Service Pharmacist  [phone]

## 2024-10-23 NOTE — Progress Notes (Signed)
 LIBREXIA RESEARCH STROKE STUDY NOTE   Patient is participating in   Librexia stroke prevention study ( standard of care antiplatelet therapy plus Milvexian-factor 11 inhibitor versus standard of care antiplatelet therapy plus placebo ).  Patient was given study material to review and informed consent form at 09:50 AM.  Patient was advised to take as much time as she wanted to review the consent form and encouraged to ask questions which were answered.  Patient's wife and diaughter were also present at the bedside and were also encouraged to review the material and ask questions.  It was made clear to the patient that study participation is voluntary and patient will still get the same excellent standard of care irrespective of whether she chooses to participate in the study or not.  The benefits of participation in the study as well as the risk involved including but not limited to bleeding as well as alternatives to not participating in the study were clearly discussed with the patient and family who expressed understanding.  The patient was clearly informed that patient has no obligation to stay in the study for the entire duration and is free to discontinue study medication or study participation if she is dissatisfied at any time in the future.  The research study office visit scheduled was explained to the patient and study obligations were clearly explained as well.  Patient voiced understanding and willingness to participate in the study.  The study inclusion exclusion criteria reviewed by me personally as well as study coordinator and verified that patient met all criteria.  Patient signed informed consent  in my presence  and wife and daughter at 23 38 pm.  The hospital research pharmacy was notified in advance about potential patient participation and after patient randomization and patient received first dose of medication before discharge and she was discharged home with the study medication bottle and  instructed to call [facility] research office with any questions.  No study specific procedure was done prior to patient signing informed consent form.  A copy of informed consent form and patient Zell of Rights signed by the patient was given to her.  Eather Popp, MD

## 2024-10-23 NOTE — Consult Note (Signed)
 Ophthalmology Consult Note  HPI: Patient is a 74 y.o. male currently admitted to ICU s/p mechanical thrombectomy and TNK yesterday after acute ischemic infarct. Patient is s/p blepharoplasty with Dr. Melba in [place] 10 days ago. Patient and family note that after the interventions yesterday, he has significant periocular edema and bruising as well as bleeding on the surface of both eyes. Patient notes no pain, vision is blurry (has been using ointment on eyes) though no significant noted vision changes. Notes it feels difficult to open his eyelids ou. He has another appt with Dr. Melba next week. Does not have any glasses with him.   Eye exam   VA on near card 20/200 ou.  CVF grossly full.  EOM appears full however impeded by suconjunctival hemorrhage esp OS IOP 23 OD, 25 OS  L/L periorbital edema and bruising of both upper and lower eyelids ou, incision at upper lids are clear and closed with no signs of infection  C/S 360 subconjuntival hemorrhage ou with some protuberance OS though well covered by lid at rest  AC deep and quiet  Lens clear   Assessment and Plan: Periorbital ecchymosis and subconjunctival hemorrhage both eyes - expected in the setting of anticoagulation and recent surgery.  - no ocular intervention. May use ice packs for comfort.  - keep follow up with his oculoplastic surgeon next week    Olam Repress  [facility]

## 2024-10-23 NOTE — Plan of Care (Signed)
" °  Problem: Education: Goal: Knowledge of disease or condition will improve Outcome: Progressing Goal: Knowledge of secondary prevention will improve (MUST DOCUMENT ALL) Outcome: Progressing Goal: Knowledge of patient specific risk factors will improve (DELETE if not current risk factor) Outcome: Progressing   Problem: Ischemic Stroke/TIA Tissue Perfusion: Goal: Complications of ischemic stroke/TIA will be minimized Outcome: Progressing   Problem: Coping: Goal: Will verbalize positive feelings about self Outcome: Progressing   Problem: Self-Care: Goal: Ability to participate in self-care as condition permits will improve Outcome: Progressing Goal: Verbalization of feelings and concerns over difficulty with self-care will improve Outcome: Progressing   Problem: Nutrition: Goal: Risk of aspiration will decrease Outcome: Progressing   "

## 2024-10-23 NOTE — Discharge Summary (Shared)
 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  Secondary diagnosis History of stroke Hypertension Hyperlipidemia OSA Recorder in 2019 Prostate cancer status post seeds Hemifacial spasm   Allergies as of 10/24/2024       Reactions   Bee Pollen Itching   Eyes itch and water    Pollen Extract Itching   Eyes itch and water         Medication List     STOP taking these medications    aspirin  325 MG tablet Replaced by: aspirin  81 MG chewable tablet   baclofen  10 MG tablet Commonly known as: LIORESAL    celecoxib 200 MG capsule Commonly known as: CELEBREX   GNP Senna Lax 8.6 MG tablet Generic drug: senna   meloxicam 15 MG tablet Commonly known as: MOBIC   ondansetron  4 MG disintegrating tablet Commonly known as: ZOFRAN -ODT   oxyCODONE  5 MG immediate release tablet Commonly known as: Oxy IR/ROXICODONE        TAKE these medications    ascorbic acid 500 MG tablet Commonly known as: VITAMIN C Take 500 mg by mouth daily.   aspirin  81 MG chewable tablet Chew 1 tablet (81 mg total) by mouth daily. Start taking on: October 25, 2024 Replaces: aspirin  325 MG tablet   Botox 100 units Solr injection Generic drug: botulinum toxin Type A Inject 100 Units into the muscle every 3 (three) months.   cholecalciferol 25 MCG (1000 UNIT) tablet Commonly known as: VITAMIN D3 Take 1,000 Units by mouth daily.   clopidogrel  75 MG tablet Commonly known as: PLAVIX  Take 1 tablet (75 mg total) by mouth daily. Start taking on: October 25, 2024   cyanocobalamin  1000 MCG  tablet Commonly known as: VITAMIN B12 Take 1,000 mcg by mouth daily.   fluticasone  50 MCG/ACT nasal spray Commonly known as: FLONASE  ONE SPRAY IN EACH NOSTRIL TWICE DAILY ASNEEDED -USE CROSSOVER TECHNIQUE DISCUSSED   LIBREXIA-STROKE milvexian or placebo 25 mg tablet Take 1 tablet by mouth 2 (two) times daily. For Investigational Use Only. For questions please contact [facility]. Bring back all bottles to research visit.   Myrbetriq 50 MG Tb24 tablet Generic drug: mirabegron ER Take 50 mg by mouth daily.   neomycin-polymyxin b-dexamethasone  3.5-10000-0.1 Oint Commonly known as: MAXITROL Place 1 Application into both eyes 3 (three) times daily. What changed: Another medication with the same name was removed. Continue taking this medication, and follow the directions you see here.   pantoprazole  40 MG tablet Commonly known as: PROTONIX  TAKE ONE TABLET BY MOUTH EVERY DAY   PRESERVISION AREDS PO Take 1 capsule by mouth 2 (two) times daily.   rosuvastatin  20 MG tablet Commonly known as: CRESTOR  TAKE ONE TABLET DAILY   sildenafil 100 MG tablet Commonly known as: VIAGRA Take 100 mg by mouth as needed for erectile dysfunction.   silodosin 8 MG Caps capsule Commonly known as: RAPAFLO Take 8 mg by mouth every evening.   Systane 0.4-0.3 % Gel ophthalmic gel Generic drug: Polyethyl Glycol-Propyl Glycol Place 1 application into both eyes at bedtime.   traMADol 50 MG  tablet Commonly known as: ULTRAM Take 50 mg by mouth 4 (four) times daily as needed.   traZODone 50 MG tablet Commonly known as: DESYREL Take 25-50 mg by mouth at bedtime as needed for sleep.   vardenafil 20 MG tablet Commonly known as: LEVITRA Take 20 mg by mouth daily as needed for erectile dysfunction.        LABORATORY STUDIES CBC    Component Value Date/Time   WBC 9.5 10/24/2024 0607   RBC 4.35 10/24/2024 0607   HGB 14.0 10/24/2024 0607   HGB 16.2 08/02/2021 0857   HCT 39.7  10/24/2024 0607   HCT 47.4 08/02/2021 0857   PLT 173 10/24/2024 0607   PLT 190 08/02/2021 0857   MCV 91.3 10/24/2024 0607   MCV 92 08/02/2021 0857   MCH 32.2 10/24/2024 0607   MCHC 35.3 10/24/2024 0607   RDW 12.4 10/24/2024 0607   RDW 12.1 08/02/2021 0857   LYMPHSABS 2.1 10/22/2024 0636   LYMPHSABS 1.6 03/19/2018 0901   MONOABS 0.7 10/22/2024 0636   EOSABS 0.2 10/22/2024 0636   EOSABS 0.1 03/19/2018 0901   BASOSABS 0.1 10/22/2024 0636   BASOSABS 0.0 03/19/2018 0901   CMP    Component Value Date/Time   NA 137 10/24/2024 0607   NA 140 08/02/2021 0857   K 4.0 10/24/2024 0607   CL 103 10/24/2024 0607   CO2 22 10/24/2024 0607   GLUCOSE 100 (H) 10/24/2024 0607   BUN 15 10/24/2024 0607   BUN 14 08/02/2021 0857   CREATININE 0.85 10/24/2024 0607   CALCIUM  8.8 (L) 10/24/2024 0607   PROT 6.9 10/22/2024 0636   ALBUMIN 4.3 10/22/2024 0636   AST 24 10/22/2024 0636   ALT 23 10/22/2024 0636   ALKPHOS 89 10/22/2024 0636   BILITOT 0.3 10/22/2024 0636   GFRNONAA >60 10/24/2024 0607   GFRAA >60 03/11/2019 0339   COAGS Lab Results  Component Value Date   INR 1.0 10/22/2024   INR 1.0 07/27/2021   INR 1.0 09/16/2020   Lipid Panel    Component Value Date/Time   CHOL 133 10/23/2024 0526   CHOL 144 08/25/2024 0813   CHOL 183 02/07/2015 1535   TRIG 64 10/23/2024 0526   TRIG 81 02/07/2015 1535   HDL 58 10/23/2024 0526   HDL 48 08/25/2024 0813   HDL 63 02/07/2015 1535   CHOLHDL 2.3 10/23/2024 0526   VLDL 13 10/23/2024 0526   LDLCALC 62 10/23/2024 0526   LDLCALC 77 08/25/2024 0813   LDLCALC 104 (H) 02/07/2015 1535   HgbA1C  Lab Results  Component Value Date   HGBA1C 5.8 (H) 10/24/2024   Alcohol Level    Component Value Date/Time   [facility] <15 10/22/2024 0636     SIGNIFICANT DIAGNOSTIC STUDIES VAS US  CAROTID Result Date: 10/23/2024 Carotid Arterial Duplex Study Patient Name:  Steve [name]  Date of Exam:   10/23/2024 Medical Rec #: [id number]           Accession #:     [id number] Date of Birth: [birth year]            Patient Gender: M Patient Age:   74 years Exam Location:  [facility] Procedure:      VAS US  CAROTID Referring Phys: NANCYANN HECK --------------------------------------------------------------------------------  Indications:       Numbness, Weakness and Carotid stenosis. Risk Factors:      Hyperlipidemia, past history of smoking, prior CVA. Comparison Study:  No prior exam. Performing Technologist: Edilia Elden Appl  Examination  Guidelines: A complete evaluation includes B-mode imaging, spectral Doppler, color Doppler, and power Doppler as needed of all accessible portions of each vessel. Bilateral testing is considered an integral part of a complete examination. Limited examinations for reoccurring indications may be performed as noted.  Right Carotid Findings: +----------+--------+--------+--------+-------------------------+--------+           PSV cm/sEDV cm/sStenosisPlaque Description       Comments +----------+--------+--------+--------+-------------------------+--------+ CCA Prox  137     23                                                +----------+--------+--------+--------+-------------------------+--------+ CCA Distal107     20              heterogenous and calcific         +----------+--------+--------+--------+-------------------------+--------+ ICA Prox  116     32      1-39%   heterogenous and calcific         +----------+--------+--------+--------+-------------------------+--------+ ICA Mid   122     31      1-39%                                     +----------+--------+--------+--------+-------------------------+--------+ ICA Distal103     27                                                +----------+--------+--------+--------+-------------------------+--------+ ECA       178     14                                                 +----------+--------+--------+--------+-------------------------+--------+ +----------+--------+-------+----------------+-------------------+           PSV cm/sEDV cmsDescribe        Arm Pressure (mmHG) +----------+--------+-------+----------------+-------------------+ Subclavian127     19     Multiphasic, WNL                    +----------+--------+-------+----------------+-------------------+ +---------+--------+--+--------+-+---------+ VertebralPSV cm/s47EDV cm/s9Antegrade +---------+--------+--+--------+-+---------+ Elevated velocities noted in the proximal carotid artery with no obvious signs of stenosis. Left Carotid Findings: +----------+--------+--------+--------+-------------------------+--------+           PSV cm/sEDV cm/sStenosisPlaque Description       Comments +----------+--------+--------+--------+-------------------------+--------+ CCA Prox  216     26                                                +----------+--------+--------+--------+-------------------------+--------+ CCA Distal108     23              heterogenous and calcific         +----------+--------+--------+--------+-------------------------+--------+ ICA Prox  107     28              heterogenous and calcific         +----------+--------+--------+--------+-------------------------+--------+ ICA Mid   116     36      1-39%                                     +----------+--------+--------+--------+-------------------------+--------+  ICA Distal92      28                                                +----------+--------+--------+--------+-------------------------+--------+ ECA       139     18                                                +----------+--------+--------+--------+-------------------------+--------+ +----------+--------+--------+----------------+-------------------+           PSV cm/sEDV cm/sDescribe        Arm Pressure (mmHG)  +----------+--------+--------+----------------+-------------------+ [id number]     17      Multiphasic, WNL                    +----------+--------+--------+----------------+-------------------+ +---------+--------+--+--------+--+---------+ VertebralPSV cm/s63EDV cm/s13Antegrade +---------+--------+--+--------+--+---------+ Elevated velocities noted in the proximal carotid artery with no obvious signs of stenosis.  Summary: Right Carotid: Velocities in the right ICA are consistent with a 1-39% stenosis. Left Carotid: Velocities in the left ICA are consistent with a 1-39% stenosis. Vertebrals:  Bilateral vertebral arteries demonstrate antegrade flow. Subclavians: Normal flow hemodynamics were seen in bilateral subclavian              arteries. *See table(s) above for measurements and observations.  Electronically signed by Gaile New MD on 10/23/2024 at 10:33:37 PM.    Final    MR BRAIN WO CONTRAST Result Date: 10/23/2024 EXAM: MRI BRAIN WITHOUT CONTRAST 10/23/2024 06:06:00 AM TECHNIQUE: Multiplanar multisequence MRI of the head/brain was performed without the administration of intravenous contrast. COMPARISON: Head CT and CTA 10/22/2024 and MRI 01/25/2022. CLINICAL HISTORY: Stroke, follow up. FINDINGS: BRAIN AND VENTRICLES: Scattered small acute cortical and subcortical infarcts are present in the left frontal and parietal lobes as well as in the left periatrial white matter (left MCA territory and ACA/MCA and MCA/PCA border zones). There is associated mild cytotoxic edema without mass effect or hemorrhage. Preexisting small chronic left frontoparietal infarcts are again noted. No hydrocephalus or extra-axial fluid collection is evident. A chronic microhemorrhage is noted in the left external capsule. T2 hyperintensities elsewhere in the cerebral white matter bilaterally are similar to the prior MRI and are nonspecific but compatible with mild chronic small vessel ischemic disease. Cerebral  volume is within normal limits for age. Major intracranial vascular flow voids are preserved. ORBITS: No significant abnormality. SINUSES AND MASTOIDS: No significant abnormality. BONES AND SOFT TISSUES: Normal marrow signal. No soft tissue abnormality. IMPRESSION: 1. Scattered small acute left cerebral infarcts. 2. Mild chronic small vessel ischemic disease. Electronically signed by: Dasie Hamburg MD 10/23/2024 08:16 AM EST RP Workstation: [id number]   ECHOCARDIOGRAM COMPLETE Result Date: 10/22/2024    ECHOCARDIOGRAM REPORT   Patient Name:   Steve [name] Date of Exam: 10/22/2024 Medical Rec #:  [id number]          Height:       75.0 in Accession #:    [id number]         Weight:       209.5 lb Date of Birth:  06/12/1951           BSA:          2.238 m Patient Age:    71 years  BP:           137/81 mmHg Patient Gender: M                  HR:           88 bpm. Exam Location:  Inpatient Procedure: 2D Echo, Cardiac Doppler, Color Doppler and Intracardiac            Opacification Agent (Both Spectral and Color Flow Doppler were            utilized during procedure). Indications:    Stroke  History:        Patient has prior history of Echocardiogram examinations. Risk                 Factors:Dyslipidemia.  Sonographer:    Sherlean Dubin Referring Phys: [phone] MCNEILL P KIRKPATRICK IMPRESSIONS  1. Amplatzer closure device noted with no obvious shunt.  2. Left ventricular ejection fraction, by estimation, is 60 to 65%. The left ventricle has normal function. The left ventricle has no regional wall motion abnormalities. There is mild left ventricular hypertrophy. Left ventricular diastolic parameters were normal.  3. Right ventricular systolic function is normal. The right ventricular size is normal.  4. The mitral valve is normal in structure. No evidence of mitral valve regurgitation. No evidence of mitral stenosis.  5. The aortic valve is tricuspid. Aortic valve regurgitation is not visualized. No aortic  stenosis is present.  6. The inferior vena cava is normal in size with greater than 50% respiratory variability, suggesting right atrial pressure of 3 mmHg. FINDINGS  Left Ventricle: Left ventricular ejection fraction, by estimation, is 60 to 65%. The left ventricle has normal function. The left ventricle has no regional wall motion abnormalities. Definity  contrast agent was given IV to delineate the left ventricular  endocardial borders. The left ventricular internal cavity size was normal in size. There is mild left ventricular hypertrophy. Left ventricular diastolic parameters were normal. Right Ventricle: The right ventricular size is normal. Right ventricular systolic function is normal. Left Atrium: Left atrial size was normal in size. Right Atrium: Right atrial size was normal in size. Pericardium: There is no evidence of pericardial effusion. Mitral Valve: The mitral valve is normal in structure. No evidence of mitral valve regurgitation. No evidence of mitral valve stenosis. MV peak gradient, 6.1 mmHg. The mean mitral valve gradient is 2.0 mmHg. Tricuspid Valve: The tricuspid valve is normal in structure. Tricuspid valve regurgitation is trivial. No evidence of tricuspid stenosis. Aortic Valve: The aortic valve is tricuspid. Aortic valve regurgitation is not visualized. No aortic stenosis is present. Aortic valve peak gradient measures 3.3 mmHg. Pulmonic Valve: The pulmonic valve was normal in structure. Pulmonic valve regurgitation is not visualized. No evidence of pulmonic stenosis. Aorta: The aortic root is normal in size and structure. Venous: The inferior vena cava is normal in size with greater than 50% respiratory variability, suggesting right atrial pressure of 3 mmHg. IAS/Shunts: No atrial level shunt detected by color flow Doppler. Additional Comments: Amplatzer closure device noted with no obvious shunt.  LEFT VENTRICLE PLAX 2D LVIDd:         4.80 cm   Diastology LVIDs:         3.10 cm   LV e'  medial:    17.70 cm/s LV PW:         1.10 cm   LV E/e' medial:  6.7 LV IVS:        1.20 cm   LV  e' lateral:   14.40 cm/s LVOT diam:     2.00 cm   LV E/e' lateral: 8.3 LV SV:         43 LV SV Index:   19 LVOT Area:     3.14 cm  RIGHT VENTRICLE            IVC RV S prime:     8.49 cm/s  IVC diam: 1.90 cm TAPSE (M-mode): 1.6 cm LEFT ATRIUM             Index        RIGHT ATRIUM           Index LA diam:        4.00 cm 1.79 cm/m   RA Area:     14.50 cm LA Vol (A2C):   43.8 ml 19.57 ml/m  RA Volume:   34.60 ml  15.46 ml/m LA Vol (A4C):   39.5 ml 17.65 ml/m LA Biplane Vol: 41.8 ml 18.68 ml/m  AORTIC VALVE AV Area (Vmax): 2.78 cm AV Vmax:        90.60 cm/s AV Peak Grad:   3.3 mmHg LVOT Vmax:      80.15 cm/s LVOT Vmean:     52.850 cm/s LVOT VTI:       0.138 m  AORTA Ao Root diam: 3.40 cm Ao Asc diam:  3.40 cm MITRAL VALVE MV Area (PHT): 4.39 cm     SHUNTS MV Area VTI:   1.86 cm     Systemic VTI:  0.14 m MV Peak grad:  6.1 mmHg     Systemic Diam: 2.00 cm MV Mean grad:  2.0 mmHg MV Vmax:       1.23 m/s MV Vmean:      69.2 cm/s MV Decel Time: 173 msec MV E velocity: 119.00 cm/s Redell Shallow MD Electronically signed by Redell Shallow MD Signature Date/Time: 10/22/2024/1:10:07 PM    Final    IR PERCUTANEOUS ART THROMBECTOMY/INFUSION INTRACRANIAL INC DIAG ANGIO Result Date: 10/22/2024 INDICATION: Acute Large Vessel occlusion, left M3 EXAM: Mechanical thrombectomy, MEDICATIONS: No additional ANESTHESIA/SEDATION: General CONTRAST:  40mL OMNIPAQUE  IOHEXOL  300 MG/ML  SOLN FLUOROSCOPY: Radiation Exposure Index (as provided by the fluoroscopic device): 210 MGy reference air Kerma COMPLICATIONS: None immediate. PRE-PROCEDURE: Maximal Sterile Barrier Technique was utilized including caps, mask, sterile gowns, sterile gloves, sterile drape, hand hygiene and skin antiseptic. A timeout was performed prior to the initiation of the procedure. PROCEDURE: Femoral access was obtained with ultrasound using direct visualization of the  needle puncture into the artery. A 90 cm neuron max sheath was placed. The neuron max was advanced over a Simmons 2 select catheter into the left internal carotid artery. The Vecta 46 catheter was advanced over a Trevo Trak 21 and Aristotle wire to the level of the occlusion in the left M3 and connected to suction. The catheter was then withdrawn under aspiration. The follow-up arteriogram demonstrated recanalization of the vessel proximally. There was a small distal embolus. FINDINGS: The left carotid bifurcation has some plaque but no stenosis. The intracranial images demonstrate a M3 branch occlusion in the left frontal lobe. The other vessels are patent in the MCA territory. The anterior cerebral artery is normal. Following the thrombectomy, there is a very distal branch occlusion in the anterior left frontal lobe. The proximal branches are all patent. There is no evidence of hemorrhage. IMPRESSION: Left frontal lobe M3 branch embolus, TICI 2C after embolectomy Codes: 38354, 23062 Electronically Signed   By: Nancyann  Heck M.D.   On: 10/22/2024 10:21   IR US  Guide Vasc Access Right Result Date: 10/22/2024 INDICATION: Acute Large Vessel occlusion, left M3 EXAM: Mechanical thrombectomy, MEDICATIONS: No additional ANESTHESIA/SEDATION: General CONTRAST:  40mL OMNIPAQUE  IOHEXOL  300 MG/ML  SOLN FLUOROSCOPY: Radiation Exposure Index (as provided by the fluoroscopic device): 210 MGy reference air Kerma COMPLICATIONS: None immediate. PRE-PROCEDURE: Maximal Sterile Barrier Technique was utilized including caps, mask, sterile gowns, sterile gloves, sterile drape, hand hygiene and skin antiseptic. A timeout was performed prior to the initiation of the procedure. PROCEDURE: Femoral access was obtained with ultrasound using direct visualization of the needle puncture into the artery. A 90 cm neuron max sheath was placed. The neuron max was advanced over a Simmons 2 select catheter into the left internal carotid artery. The  Vecta 46 catheter was advanced over a Trevo Trak 21 and Aristotle wire to the level of the occlusion in the left M3 and connected to suction. The catheter was then withdrawn under aspiration. The follow-up arteriogram demonstrated recanalization of the vessel proximally. There was a small distal embolus. FINDINGS: The left carotid bifurcation has some plaque but no stenosis. The intracranial images demonstrate a M3 branch occlusion in the left frontal lobe. The other vessels are patent in the MCA territory. The anterior cerebral artery is normal. Following the thrombectomy, there is a very distal branch occlusion in the anterior left frontal lobe. The proximal branches are all patent. There is no evidence of hemorrhage. IMPRESSION: Left frontal lobe M3 branch embolus, TICI 2C after embolectomy Codes: 38354, 23062 Electronically Signed   By: Nancyann Burns M.D.   On: 10/22/2024 10:21   CT ANGIO HEAD NECK W WO CM W PERF Result Date: 10/22/2024 EXAM: CTA Head and Neck with Perfusion 10/22/2024 08:30:48 AM TECHNIQUE: CTA of the head and neck was performed without and with the administration of 100 mL of intravenous iohexol  (OMNIPAQUE ) 350 MG/ML injection. 3D postprocessing with multiplanar reconstructions and MIPs was performed to evaluate the vascular anatomy. Cerebral perfusion analysis using computed tomography with contrast administration, including post-processing of parametric maps with determination of cerebral blood flow, cerebral blood volume, mean transit time and time-to-maximum. Automated exposure control, iterative reconstruction, and/or weight based adjustment of the mA/kV was utilized to reduce the radiation dose to as low as reasonably achievable. COMPARISON: CT angiogram of the head and neck dated 10/22/2024. CLINICAL HISTORY: Neuro deficit, acute, stroke suspected. FINDINGS: CTA NECK: AORTIC ARCH AND ARCH VESSELS: No dissection or arterial injury. No significant stenosis of the brachiocephalic or  subclavian arteries. CERVICAL CAROTID ARTERIES: Left carotid bulb demonstrates calcific and noncalcific plaque. There has been an interval change in its appearance as there is no longer a focal intraluminal filling defect, suggesting the previous intraluminal thrombus has embolized in the interim. There is moderate calcific plaque again demonstrated within the origin of the left internal carotid artery. The right carotid system is patent without significant stenosis or dissection. No other dissection or arterial injury is identified in the cervical carotid arteries. CERVICAL VERTEBRAL ARTERIES: No dissection, arterial injury, or significant stenosis. LUNGS AND MEDIASTINUM: Unremarkable. SOFT TISSUES: No acute abnormality. BONES: No acute abnormality. CTA HEAD: ANTERIOR CIRCULATION: No significant stenosis of the internal carotid arteries. No significant stenosis of the anterior cerebral arteries. There is a new focal occlusion of the M3 branch of the left middle cerebral artery seen on image 76 of series 6. The right middle cerebral artery is patent proximally without significant stenosis. No aneurysm. POSTERIOR CIRCULATION: No significant stenosis of  the posterior cerebral arteries. No significant stenosis of the basilar artery. No significant stenosis of the vertebral arteries. No aneurysm. OTHER: No dural venous sinus thrombosis on this non-dedicated study. CT PERFUSION: EXAM QUALITY: Exam quality is adequate with diagnostic perfusion maps. No significant motion artifact. Appropriate arterial inflow and venous outflow curves. CORE INFARCT (CBF<30% volume): 8 mL TOTAL HYPOPERFUSION (Tmax>6s volume): 23 mL PENUMBRA: Mismatch volume: 15 mL Mismatch ratio: 2.9 Location: Left MCA distribution IMPRESSION: 1. New focal occlusion of an M3 branch of the left middle cerebral artery. 2. CT perfusion demonstrates cerebral blood flow less than 30% measuring 8 mL with a mismatch volume of 15 mL and a mismatch ratio of 2.9 in  the left MCA distribution. 3. Interval change in the appearance of calcific and noncalcific plaque within the left carotid bulb, with no longer a focal intraluminal filling defect, suggesting the previous intraluminal thrombus has embolized in the interim. 4. Moderate calcific plaque within the origin of the left internal carotid artery. 5. The above findings were communicated to Dr. Vanessa at 8:55 AM 10/22/24. Electronically signed by: Evalene Coho MD 10/22/2024 08:56 AM EST RP Workstation: [id number]   CT HEAD WO CONTRAST ( ) Result Date: 10/22/2024 EXAM: CT HEAD WITHOUT CONTRAST 10/22/2024 08:20:33 AM TECHNIQUE: CT of the head was performed without the administration of intravenous contrast. Automated exposure control, iterative reconstruction, and/or weight based adjustment of the mA/kV was utilized to reduce the radiation dose to as low as reasonably achievable. COMPARISON: CT of the head dated 10/22/2024. CLINICAL HISTORY: Stroke, follow up. FINDINGS: BRAIN AND VENTRICLES: Focal encephalomalacia changes again demonstrated within the left posterior frontal lobe. There is no convincing evidence of acute infarction. No acute hemorrhage. No hydrocephalus. No extra-axial collection. No mass effect or midline shift. ORBITS: No acute abnormality. SINUSES: No acute abnormality. SOFT TISSUES AND SKULL: No acute soft tissue abnormality. No skull fracture. IMPRESSION: 1. No acute intracranial abnormality. No convincing CT evidence of acute infarction. 2. Focal encephalomalacia in the left posterior frontal lobe, stable compared to CT dated 10/22/24. Electronically signed by: Evalene Coho MD 10/22/2024 08:31 AM EST RP Workstation: [id number]   CT ANGIO HEAD NECK W WO CM (CODE STROKE) Result Date: 10/22/2024 EXAM: CTA HEAD AND NECK WITH AND WITHOUT 10/22/2024 06:54:00 AM TECHNIQUE: CTA of the head and neck was performed with and without the administration of 75 mL of intravenous iohexol  (OMNIPAQUE ) 350  MG/ML injection. Multiplanar 2D and/or 3D reformatted images are provided for review. Automated exposure control, iterative reconstruction, and/or weight based adjustment of the mA/kV was utilized to reduce the radiation dose to as low as reasonably achievable. Stenosis of the internal carotid arteries measured using NASCET criteria. COMPARISON: CT of the head dated 10/22/2024. CLINICAL HISTORY: Neuro deficit, acute, stroke suspected. FINDINGS: CTA NECK: AORTIC ARCH AND ARCH VESSELS: Mild calcific plaque within the aortic arch. No dissection or arterial injury. No significant stenosis of the brachiocephalic or subclavian arteries. CERVICAL CAROTID ARTERIES: Moderate calcific and soft plaque is present within the left carotid bulb, with questionable intraluminal thrombus. There is moderate calcific plaque within the origin of the left internal carotid artery, resulting in approximately 50% luminal stenosis. There is mild-to-moderate calcific plaque within the origin of the right internal carotid artery, with less than 30% luminal stenosis. No dissection or arterial injury. CERVICAL VERTEBRAL ARTERIES: The vertebral arteries are codominant and normal in caliber throughout their respective courses. No dissection, arterial injury, or significant stenosis. LUNGS AND MEDIASTINUM: Unremarkable. SOFT TISSUES: No acute abnormality. BONES: No acute  abnormality. CTA HEAD: ANTERIOR CIRCULATION: The intracranial internal carotid arteries appear patent without significant stenosis. The anterior cerebral arteries and middle cerebral arteries appear normal in caliber. No aneurysm. POSTERIOR CIRCULATION: The posterior cerebral arteries and basilar artery appear normal in caliber. The intracranial vertebral arteries appear patent without significant stenosis. No aneurysm. OTHER: No dural venous sinus thrombosis on this non-dedicated study. There is no evidence of emergent large vessel occlusion. IMPRESSION: 1. Moderate calcific and  soft plaque in the left carotid bulb with questionable intraluminal thrombus and moderate calcific plaque in the origin of the left internal carotid artery, resulting in approximately 50% luminal stenosis; differential considerations include ulcerated atherosclerotic plaque with superimposed thrombus (favored), less likely carotid dissection or flow-related/artifactual filling defect. Recommend clinical correlation and vascular surgery consultation; consider confirmatory carotid duplex ultrasound and/or short-interval repeat CTA/MRA to assess the suspected intraluminal thrombus and interval change. 2. Mild-to-moderate calcific plaque in the origin of the right internal carotid artery, resulting in less than 30% luminal stenosis. 3. No evidence of emergent large vessel occlusion. 4. Findings were communicated to Dr. Michaela at 07:03 AM on 10/22/2024. Electronically signed by: Evalene Coho MD 10/22/2024 07:12 AM EST RP Workstation: [id number]   CT HEAD CODE STROKE WO CONTRAST Result Date: 10/22/2024 EXAM: CT HEAD WITHOUT CONTRAST 10/22/2024 06:37:00 AM TECHNIQUE: CT of the head was performed without the administration of intravenous contrast. Automated exposure control, iterative reconstruction, and/or weight based adjustment of the mA/kV was utilized to reduce the radiation dose to as low as reasonably achievable. COMPARISON: CT of the head dated 07/27/2021. CLINICAL HISTORY: Neuro deficit, acute, stroke suspected. FINDINGS: BRAIN AND VENTRICLES: No acute hemorrhage. No evidence of acute infarct. Focal encephalomalacia changes are noted within the left posterior frontal lobe from remote cortical infarct. No hydrocephalus. No extra-axial collection. No mass effect or midline shift. The findings were communicated to Doctor Michaela at 6:41 AM 10/22/2024. [place] Stroke Program Early CT Score (ASPECTS): Ganglionic (caudate, IC, lentiform nucleus, insula, M1-M3): 7 Supraganglionic (M4-M6): 3 Total: 10  ORBITS: No acute abnormality. SINUSES: No acute abnormality. SOFT TISSUES AND SKULL: No acute soft tissue abnormality. No skull fracture. IMPRESSION: 1. No acute intracranial abnormality in the setting of acute neurologic deficit/stroke suspected. 2. Focal encephalomalacia changes in the left posterior frontal lobe from remote cortical infarct. 3. ASPECTS: 10. Electronically signed by: Evalene Coho MD 10/22/2024 06:44 AM EST RP Workstation: [id number]       HISTORY OF PRESENT ILLNESS 74 y.o. patient with history of multiple previous strokes, hyperlipidemia, prostate cancer and chronic right sided hemifacial spasm was admitted with acute onset facial droop and right arm weakness and numbness  HOSPITAL COURSE Patient received TNK and initially appeared to improve but then worsened, developing aphasia.  He was seen on initial CT angiogram to have possible intraluminal thrombus in the left ICA.  Repeat CT angiogram demonstrated left M3 occlusion and absence of intraluminal thrombus in left ICA, and patient was taken for mechanical thrombectomy.  MRI demonstrates small scattered acute left MCA territory infarcts.  He was evaluated by Dr. Lester and will likely undergo left carotid stenting as an outpatient.  Of note, patient underwent blepharoplasty on 1/6 and after TNK was found to have subconjunctival hemorrhage as well as periorbital bruising but was evaluated by ophthalmology and directed to follow-up with his surgeon on an outpatient basis.  He enrolled in the Librexia trial and will go home with study medication.  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  ASPECTS 10.    CTA head & neck moderate calcific and soft plaque in the left carotid bulb with questionable intraluminal thrombus resulting in approximately 50% luminal stenosis, no emergent  LVO Repeat CTA with perfusion new occlusion of M3 branch in the left MCA, 8 mm core infarct with 15 mL penumbra in left MCA territory, interval change in the appearance of calcific and noncalcific plaque within left carotid bulb with no longer focal intraluminal filling defect suggesting that previous intraluminal thrombus has embolized MRI scattered small acute left MCA territory infarcts 2D Echo Amplatzer closure device in place with no obvious shunt, EF 60 to 65%, mild LVH, normal left atrial size,, no atrial level shunt LDL 62 HgbA1c 6.2 VTE prophylaxis -SCDs aspirin  325 mg daily prior to admission, now on aspirin  81 mg daily and clopidogrel  75 mg daily for 12 weeks and then Plavix  alone.  Erolled into Librexia stroke trial. Therapy recommendations:  No follow up needed  Disposition: home today   Hx of Stroke/TIA 01/2014 right SO infarct, EF normal, discharged on aspirin  81 and Zocor  40 Loop recorder placed between 2017 and 2020, no A-fib underwent PFO closure in 2019 07/2021 admitted for left frontal scattered infarcts.  MRA head and neck negative.  LDL 104, A1c 5.6.  EF 60 to 60%.  TCD bubble study negative.  Head CT negative.  Zio patch negative for A-fib.  Discharged on DAPT and Lipitor 40.   Left ICA stenosis Approximate 50% stenosis of left ICA seen on CT angiogram On initial CTA, intraluminal thrombus was seen in left ICA, which had disappeared on follow-up CTA, suggesting the thrombus had embolized Discussed carotid stenting with Dr. Lester, who will likely perform this on an outpatient basis   Hypertension Home meds: None Stable Monitor BP goal normotensive   Hyperlipidemia Home meds: Rosuvastatin  20 mg daily, resumed in hospital LDL 62, goal < 70 Continue statin at discharge   Other Stroke Risk Factors Advanced age OSA    Other Active Problems Subconjunctival hemorrhage and periorbital bruising after recent blepharoplasty, ophthalmology consulted and continue  erythromycin  ophthalmic ointment History of hemifacial spasm with Botox treatment follow-up with Dr. Tobie Prostate cancer status post seeds   DISCHARGE EXAM  PHYSICAL EXAM General:  Alert, well-nourished, well-developed patient in no acute distress Psych:  Mood and affect appropriate for situation CV: Regular rate and rhythm on monitor Respiratory:  Regular, unlabored respirations on room air Reddened sclera noted  NEURO:  Mental Status: AA&Ox3  Speech/Language: speech is without dysarthria or aphasia.    Cranial Nerves:  II: PERRL. Visual fields full.  III, IV, VI: EOMI. Eyelids elevate symmetrically.  V: Sensation is intact to light touch and symmetrical to face.  VII: Subtle right facial droop VIII: hearing intact to voice. IX, X: Palate elevates symmetrically. Phonation is normal.  KP:Dynloizm shrug 5/5. XII: tongue is midline without fasciculations. Motor: 5/5 strength to all muscle groups tested.  Tone: is normal and bulk is normal Sensation- Intact to light touch bilaterally. Extinction absent to light touch to DSS. Coordination: FTN intact bilaterally Gait- deferred  1a Level of Conscious.: 0 1b LOC Questions: 0 1c LOC Commands: 0 2 Best Gaze: 0 3 Visual: 0 4 Facial Palsy: 1 5a Motor Arm - left: 0 5b Motor Arm - Right: 0 6a Motor Leg - Left: 0 6b Motor Leg - Right: 0 7 Limb Ataxia: 0 8  Sensory: 0 9 Best Language: 0 10 Dysarthria: 0 11 Extinct. and Inatten.: 0 TOTAL: 1   Discharge Diet       Diet   Diet regular Room service appropriate? Yes; Fluid consistency: Thin   liquids  DISCHARGE PLAN Disposition: Home aspirin  81 mg daily and clopidogrel  75 mg daily for secondary stroke prevention for 3 months then clopidogrel  75 mg daily alone. Follow-up in research clinic for Librexia study Ongoing stroke risk factor control by Primary Care Physician at time of discharge Follow-up PCP Geofm, Glade PARAS, MD in 2 weeks. Follow up with Dr. Lester for left carotid  stenting Follow-up with Dr. Melba regarding blepharoplasty recovery Follow-up in [facility] with Dr. Tobie neurology, office to schedule an appointment.   35 minutes were spent preparing discharge.  Jaydian Santana E Everitt Clint Kill , MSN, AGACNP-BC [facility] See Amion for schedule and pager information 10/24/2024 11:36 AM  ATTENDING NOTE: I reviewed above note and agree with the assessment and plan.   No acute event overnight.  Patient doing well clinically.  On DAPT and statin.  On Librexia stroke trial medication.  Medically ready for discharge.  Follow-up with Dr. Tobie at [facility]. follow-up with Dr. Lester as outpatient.  For detailed assessment and plan, please refer to above as I have made changes wherever appropriate.   Ary Cummins, MD PhD Stroke Neurology 10/24/2024 7:35 PM

## 2024-10-23 NOTE — Progress Notes (Signed)
 Physical Therapy Brief Evaluation and Discharge Note Patient Details Name: Steve [name] MRN: [id number] DOB: 1951-04-26 Today's Date: 10/23/2024   History of Present Illness  Pt is a 74 y/o male presenting 1/15 with R hand and face numbness and weakness.  MRI shows scattered small acute left infarcts.  Pt s/p TNK and embolectomy.  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 There are no further acute PT needs, but OPPT would be great to help address R UE deficits, dynamic balance issues and mild weaknesses.   Will sign off at this time.        PT Assessment    Assistance Needed at Discharge       Equipment Recommendations None recommended by PT  Recommendations for Other Services       Precautions/Restrictions Precautions Precautions: Fall        Mobility  Bed Mobility          Transfers Overall transfer level: Modified independent                 General transfer comment: First time up was generally steady, but mild swaying. afterward, STS at Mod I    Ambulation/Gait Ambulation/Gait assistance: Supervision Gait Distance (Feet): 300 Feet Assistive device: None Gait Pattern/deviations: Step-through pattern   General Gait Details: Episodes of drift R and steppage right to regain balance, but lower risk of falls even 1st time OOB.  Home Activity Instructions    Stairs Stairs: Yes Stairs assistance: Supervision, Contact guard assist Stair Management: One rail Right, Alternating pattern, Forwards Number of Stairs: 12 General stair comments: safe with rails and carrying nothing even 1st day OOB  Modified Rankin (Stroke Patients Only) Modified Rankin (Stroke Patients Only) Pre-Morbid Rankin Score: No symptoms Modified Rankin: Moderate disability      Balance    Sitting-balance support: No upper extremity supported Sitting balance-Leahy Scale: Good     Standing balance support: No upper extremity supported, During functional activity Standing balance-Leahy Scale: Fair            Pertinent Vitals/Pain   Pain Assessment Pain Assessment: No/denies pain     Home Living   Living Arrangements: Spouse/significant other       Home Equipment: Cane - single point        Prior Function        UE/LE Assessment               Communication   Communication Communication: No apparent difficulties     Cognition         General Comments General comments (skin integrity, edema, etc.): vss immediately after return to room    Exercises     Assessment/Plan    PT Problem List Decreased strength;Decreased activity tolerance;Decreased balance;Decreased mobility       PT Visit Diagnosis Unsteadiness on feet (R26.81);Other symptoms and signs involving the nervous system (R29.898)    No Skilled PT     Co-evaluation                AMPAC 6 Clicks Help needed turning from your back to your side while in a flat bed without using bedrails?: None Help needed moving from lying on your back to sitting on the side of a flat bed without using bedrails?: None Help needed moving  to and from a bed to a chair (including a wheelchair)?: None Help needed standing up from a chair using your arms (e.g., wheelchair or bedside chair)?: A Little Help needed to walk in hospital room?: A Little Help needed climbing 3-5 steps with a railing? : A Little 6 Click Score: 21      End of Session   Activity Tolerance: Patient tolerated treatment well Patient left: in bed;with call bell/phone within reach;with family/visitor present Nurse Communication: Mobility status PT Visit Diagnosis: Unsteadiness on feet (R26.81);Other symptoms and signs involving the nervous system (R29.898)     Time: 1341-1420 PT Time Calculation (min) (ACUTE  ONLY): 39 min  Charges:   PT Evaluation $PT Eval Moderate Complexity: 1 Mod PT Treatments $Gait Training: 8-22 mins $Neuromuscular Re-education: 8-22 mins    10/23/2024  Steve [name]., PT Acute Rehabilitation Services [phone]  (office)  Steve [name]  10/23/2024, 6:59 PM

## 2024-10-23 NOTE — Plan of Care (Signed)
  Problem: Education: Goal: Knowledge of disease or condition will improve Outcome: Progressing Goal: Knowledge of secondary prevention will improve (MUST DOCUMENT ALL) Outcome: Progressing Goal: Knowledge of patient specific risk factors will improve (DELETE if not current risk factor) Outcome: Progressing   Problem: Ischemic Stroke/TIA Tissue Perfusion: Goal: Complications of ischemic stroke/TIA will be minimized Outcome: Progressing   Problem: Coping: Goal: Will verbalize positive feelings about self Outcome: Progressing Goal: Will identify appropriate support needs Outcome: Progressing   Problem: Health Behavior/Discharge Planning: Goal: Ability to manage health-related needs will improve Outcome: Progressing Goal: Goals will be collaboratively established with patient/family Outcome: Progressing   Problem: Self-Care: Goal: Ability to participate in self-care as condition permits will improve Outcome: Progressing Goal: Verbalization of feelings and concerns over difficulty with self-care will improve Outcome: Progressing Goal: Ability to communicate needs accurately will improve Outcome: Progressing   Problem: Nutrition: Goal: Risk of aspiration will decrease Outcome: Progressing Goal: Dietary intake will improve Outcome: Progressing   Problem: Education: Goal: Knowledge of General Education information will improve Description: Including pain rating scale, medication(s)/side effects and non-pharmacologic comfort measures Outcome: Progressing   Problem: Health Behavior/Discharge Planning: Goal: Ability to manage health-related needs will improve Outcome: Progressing   Problem: Clinical Measurements: Goal: Ability to maintain clinical measurements within normal limits will improve Outcome: Progressing Goal: Will remain free from infection Outcome: Progressing Goal: Diagnostic test results will improve Outcome: Progressing Goal: Respiratory complications will  improve Outcome: Progressing Goal: Cardiovascular complication will be avoided Outcome: Progressing   Problem: Activity: Goal: Risk for activity intolerance will decrease Outcome: Progressing   Problem: Nutrition: Goal: Adequate nutrition will be maintained Outcome: Progressing   Problem: Coping: Goal: Level of anxiety will decrease Outcome: Progressing   Problem: Elimination: Goal: Will not experience complications related to bowel motility Outcome: Progressing Goal: Will not experience complications related to urinary retention Outcome: Progressing   Problem: Pain Managment: Goal: General experience of comfort will improve and/or be controlled Outcome: Progressing   Problem: Safety: Goal: Ability to remain free from injury will improve Outcome: Progressing   Problem: Skin Integrity: Goal: Risk for impaired skin integrity will decrease Outcome: Progressing   Problem: Education: Goal: Understanding of CV disease, CV risk reduction, and recovery process will improve Outcome: Progressing Goal: Individualized Educational Video(s) Outcome: Progressing   Problem: Activity: Goal: Ability to return to baseline activity level will improve Outcome: Progressing   Problem: Cardiovascular: Goal: Ability to achieve and maintain adequate cardiovascular perfusion will improve Outcome: Progressing Goal: Vascular access site(s) Level 0-1 will be maintained Outcome: Progressing   Problem: Health Behavior/Discharge Planning: Goal: Ability to safely manage health-related needs after discharge will improve Outcome: Progressing

## 2024-10-23 NOTE — TOC CM/SW Note (Signed)
 Transition of Care [facility]) - Inpatient Brief Assessment   Patient Details  Name: Steve [name] MRN: [id number] Date of Birth: 1950/12/15  [facility] CM/SW Contact:    Bita Cartwright M, RN Phone Number: 10/23/2024, 5:16 PM   Clinical Narrative:  Patient is a 74 y.o. male currently admitted to ICU s/p mechanical thrombectomy and TNK yesterday after acute ischemic infarct.  Awaiting PT/OT evaluations; likely dc home tomorrow with family.  Inpatient Care Management will follow for home needs.    Transition of Care Asessment: Insurance and Status: Insurance coverage has been reviewed Patient has primary care physician:  Dorma Burns) Home environment has been reviewed: Lives with spouse Prior level of function:: Independent Prior/Current Home Services: No current home services Social Drivers of Health Review: SDOH reviewed no interventions necessary Readmission risk has been reviewed: Yes Transition of care needs: no transition of care needs at this time  Mliss MICAEL Fass, RN, BSN  Trauma/Neuro ICU Case Manager [phone]

## 2024-10-23 NOTE — Progress Notes (Addendum)
 STROKE TEAM PROGRESS NOTE    SIGNIFICANT HOSPITAL EVENTS 1/15: Patient admitted with right hand and face numbness and weakness, received TNK and underwent mechanical thrombectomy  INTERIM HISTORY/SUBJECTIVE  Patient remains hemodynamically stable and afebrile overnight.  He has significant periorbital bruising with subconjunctival hemorrhage bilaterally.  Will consult ophthalmology.  Given that plaque in left ICA is likely origin of stroke and this being his third stroke , discussed possible elective stenting with Dr. Lester.Speech has returned back to baseline but right lower face weakness persists.  OBJECTIVE  CBC    Component Value Date/Time   WBC 5.9 10/22/2024 0636   RBC 4.86 10/22/2024 0636   HGB 15.6 10/22/2024 0636   HGB 16.2 08/02/2021 0857   HCT 44.8 10/22/2024 0636   HCT 47.4 08/02/2021 0857   PLT 186 10/22/2024 0636   PLT 190 08/02/2021 0857   MCV 92.2 10/22/2024 0636   MCV 92 08/02/2021 0857   MCH 32.1 10/22/2024 0636   MCHC 34.8 10/22/2024 0636   RDW 12.2 10/22/2024 0636   RDW 12.1 08/02/2021 0857   LYMPHSABS 2.1 10/22/2024 0636   LYMPHSABS 1.6 03/19/2018 0901   MONOABS 0.7 10/22/2024 0636   EOSABS 0.2 10/22/2024 0636   EOSABS 0.1 03/19/2018 0901   BASOSABS 0.1 10/22/2024 0636   BASOSABS 0.0 03/19/2018 0901    BMET    Component Value Date/Time   NA 139 10/22/2024 0636   NA 140 08/02/2021 0857   K 4.4 10/22/2024 0636   CL 104 10/22/2024 0636   CO2 25 10/22/2024 0636   GLUCOSE 128 (H) 10/22/2024 0636   BUN 16 10/22/2024 0636   BUN 14 08/02/2021 0857   CREATININE 1.00 10/22/2024 0636   CALCIUM  9.2 10/22/2024 0636   EGFR 82 08/02/2021 0857   GFRNONAA >60 10/22/2024 0636    IMAGING past 24 hours MR BRAIN WO CONTRAST Result Date: 10/23/2024 EXAM: MRI BRAIN WITHOUT CONTRAST 10/23/2024 06:06:00 AM TECHNIQUE: Multiplanar multisequence MRI of the head/brain was performed without the administration of intravenous contrast. COMPARISON: Head CT and CTA  10/22/2024 and MRI 01/25/2022. CLINICAL HISTORY: Stroke, follow up. FINDINGS: BRAIN AND VENTRICLES: Scattered small acute cortical and subcortical infarcts are present in the left frontal and parietal lobes as well as in the left periatrial white matter (left MCA territory and ACA/MCA and MCA/PCA border zones). There is associated mild cytotoxic edema without mass effect or hemorrhage. Preexisting small chronic left frontoparietal infarcts are again noted. No hydrocephalus or extra-axial fluid collection is evident. A chronic microhemorrhage is noted in the left external capsule. T2 hyperintensities elsewhere in the cerebral white matter bilaterally are similar to the prior MRI and are nonspecific but compatible with mild chronic small vessel ischemic disease. Cerebral volume is within normal limits for age. Major intracranial vascular flow voids are preserved. ORBITS: No significant abnormality. SINUSES AND MASTOIDS: No significant abnormality. BONES AND SOFT TISSUES: Normal marrow signal. No soft tissue abnormality. IMPRESSION: 1. Scattered small acute left cerebral infarcts. 2. Mild chronic small vessel ischemic disease. Electronically signed by: Dasie Hamburg MD 10/23/2024 08:16 AM EST RP Workstation: [id number]   ECHOCARDIOGRAM COMPLETE Result Date: 10/22/2024    ECHOCARDIOGRAM REPORT   Patient Name:   Steve [name] Date of Exam: 10/22/2024 Medical Rec #:  [id number]          Height:       75.0 in Accession #:    [id number]         Weight:       209.5 lb Date of  Birth:  1951-07-23           BSA:          2.238 m Patient Age:    74 years           BP:           137/81 mmHg Patient Gender: M                  HR:           88 bpm. Exam Location:  Inpatient Procedure: 2D Echo, Cardiac Doppler, Color Doppler and Intracardiac            Opacification Agent (Both Spectral and Color Flow Doppler were            utilized during procedure). Indications:    Stroke  History:        Patient has prior history of  Echocardiogram examinations. Risk                 Factors:Dyslipidemia.  Sonographer:    Sherlean Dubin Referring Phys: [phone] MCNEILL P KIRKPATRICK IMPRESSIONS  1. Amplatzer closure device noted with no obvious shunt.  2. Left ventricular ejection fraction, by estimation, is 60 to 65%. The left ventricle has normal function. The left ventricle has no regional wall motion abnormalities. There is mild left ventricular hypertrophy. Left ventricular diastolic parameters were normal.  3. Right ventricular systolic function is normal. The right ventricular size is normal.  4. The mitral valve is normal in structure. No evidence of mitral valve regurgitation. No evidence of mitral stenosis.  5. The aortic valve is tricuspid. Aortic valve regurgitation is not visualized. No aortic stenosis is present.  6. The inferior vena cava is normal in size with greater than 50% respiratory variability, suggesting right atrial pressure of 3 mmHg. FINDINGS  Left Ventricle: Left ventricular ejection fraction, by estimation, is 60 to 65%. The left ventricle has normal function. The left ventricle has no regional wall motion abnormalities. Definity  contrast agent was given IV to delineate the left ventricular  endocardial borders. The left ventricular internal cavity size was normal in size. There is mild left ventricular hypertrophy. Left ventricular diastolic parameters were normal. Right Ventricle: The right ventricular size is normal. Right ventricular systolic function is normal. Left Atrium: Left atrial size was normal in size. Right Atrium: Right atrial size was normal in size. Pericardium: There is no evidence of pericardial effusion. Mitral Valve: The mitral valve is normal in structure. No evidence of mitral valve regurgitation. No evidence of mitral valve stenosis. MV peak gradient, 6.1 mmHg. The mean mitral valve gradient is 2.0 mmHg. Tricuspid Valve: The tricuspid valve is normal in structure. Tricuspid valve regurgitation is  trivial. No evidence of tricuspid stenosis. Aortic Valve: The aortic valve is tricuspid. Aortic valve regurgitation is not visualized. No aortic stenosis is present. Aortic valve peak gradient measures 3.3 mmHg. Pulmonic Valve: The pulmonic valve was normal in structure. Pulmonic valve regurgitation is not visualized. No evidence of pulmonic stenosis. Aorta: The aortic root is normal in size and structure. Venous: The inferior vena cava is normal in size with greater than 50% respiratory variability, suggesting right atrial pressure of 3 mmHg. IAS/Shunts: No atrial level shunt detected by color flow Doppler. Additional Comments: Amplatzer closure device noted with no obvious shunt.  LEFT VENTRICLE PLAX 2D LVIDd:         4.80 cm   Diastology LVIDs:         3.10 cm  LV e' medial:    17.70 cm/s LV PW:         1.10 cm   LV E/e' medial:  6.7 LV IVS:        1.20 cm   LV e' lateral:   14.40 cm/s LVOT diam:     2.00 cm   LV E/e' lateral: 8.3 LV SV:         43 LV SV Index:   19 LVOT Area:     3.14 cm  RIGHT VENTRICLE            IVC RV S prime:     8.49 cm/s  IVC diam: 1.90 cm TAPSE (M-mode): 1.6 cm LEFT ATRIUM             Index        RIGHT ATRIUM           Index LA diam:        4.00 cm 1.79 cm/m   RA Area:     14.50 cm LA Vol (A2C):   43.8 ml 19.57 ml/m  RA Volume:   34.60 ml  15.46 ml/m LA Vol (A4C):   39.5 ml 17.65 ml/m LA Biplane Vol: 41.8 ml 18.68 ml/m  AORTIC VALVE AV Area (Vmax): 2.78 cm AV Vmax:        90.60 cm/s AV Peak Grad:   3.3 mmHg LVOT Vmax:      80.15 cm/s LVOT Vmean:     52.850 cm/s LVOT VTI:       0.138 m  AORTA Ao Root diam: 3.40 cm Ao Asc diam:  3.40 cm MITRAL VALVE MV Area (PHT): 4.39 cm     SHUNTS MV Area VTI:   1.86 cm     Systemic VTI:  0.14 m MV Peak grad:  6.1 mmHg     Systemic Diam: 2.00 cm MV Mean grad:  2.0 mmHg MV Vmax:       1.23 m/s MV Vmean:      69.2 cm/s MV Decel Time: 173 msec MV E velocity: 119.00 cm/s Redell Shallow MD Electronically signed by Redell Shallow MD Signature  Date/Time: 10/22/2024/1:10:07 PM    Final     Vitals:   10/23/24 0800 10/23/24 0900 10/23/24 1000 10/23/24 1100  BP: (!) 149/96 (!) 154/96 (!) 155/82 (!) 144/90  Pulse: 77 87 90 84  Resp: 18 19 (!) 24 16  Temp: 98.5 F (36.9 C)     TempSrc: Oral     SpO2: 99% 95% 96% 96%  Weight:      Height:         PHYSICAL EXAM General:  Alert, well-nourished, well-developed patient in no acute distress Psych:  Mood and affect appropriate for situation CV: Regular rate and rhythm on monitor Respiratory:  Regular, unlabored respirations on room air  Significant periorbital bruising and subconjunctival hemorrhage bilaterally  NEURO:  Mental Status: AA&Ox3, patient is able to give clear and coherent history Speech/Language: speech is without dysarthria or aphasia.    Cranial Nerves:  II: PERRL. Visual fields full.  III, IV, VI: EOMI. Eyelids elevate symmetrically.  V: Sensation is intact to light touch and symmetrical to face.  VII: Subtle left facial droop VIII: hearing intact to voice. IX, X: Palate elevates symmetrically. Phonation is normal.  KP:Dynloizm shrug 5/5. XII: tongue is midline without fasciculations. Motor: 5/5 strength to all muscle groups tested.  Tone: is normal and bulk is normal Sensation- Intact to light touch bilaterally.  Coordination: FTN intact bilaterally  Gait- deferred  Most Recent NIH  1a Level of Conscious.: 0 1b LOC Questions: 0 1c LOC Commands: 0 2 Best Gaze: 0 3 Visual: 0 4 Facial Palsy: 1 5a Motor Arm - left: 0 5b Motor Arm - Right: 0 6a Motor Leg - Left: 0 6b Motor Leg - Right: 0 7 Limb Ataxia: 0 8 Sensory: 0 9 Best Language: 0 10 Dysarthria: 0 11 Extinct. and Inatten.: 0 TOTAL: 1  Premorbid MRS 0  ASSESSMENT/PLAN  [name] is a 74 y.o. male with history of multiple previous strokes, hyperlipidemia, prostate cancer and chronic right-sided hemifacial spasm admitted for acute onset right facial droop and right arm weakness  with numbness.  Patient received TNK and initially appeared to improve but then worsened, developing aphasia.  Repeat CT angiogram demonstrated left M3 occlusion, and patient was taken for mechanical thrombectomy, which was successful.  He did have a recent bilateral blepharoplasty and after TNK administration, he did have some subconjunctival hemorrhage as well as periorbital bruising.  NIH on Admission 5  Acute Ischemic Infarct: Small, scattered left MCA territory infarcts s/p mechanical thrombectomy and TNK Etiology: Atheroembolic in the setting of left ICA plaque with moderate symptomatic left ICA stenosis Code Stroke CT head No acute abnormality.  ASPECTS 10.    CTA head & neck moderate calcific and soft plaque in the left carotid bulb with questionable intraluminal thrombus resulting in approximately 50% luminal stenosis, no emergent LVO Repeat CTA with perfusion new occlusion of M3 branch in the left MCA, 8 mm core infarct with 15 mL penumbra in left MCA territory, interval change in the appearance of calcific and noncalcific plaque within left carotid bulb with no longer focal intraluminal filling defect suggesting that previous intraluminal thrombus has embolized MRI scattered small acute left MCA territory infarcts 2D Echo Amplatzer closure device in place with no obvious shunt, EF 60 to 65%, mild LVH, normal left atrial size,, no atrial level shunt LDL 62 HgbA1c 6.2 VTE prophylaxis -SCDs aspirin  325 mg daily prior to admission, now on aspirin  81 mg daily and clopidogrel  75 mg daily for 12 weeks and then Plavix  alone. Therapy recommendations:  No follow up needed  Disposition: Pending, likely home  Hx of Stroke/TIA Patient has history of left centrum semiovale stroke in 2016, underwent PFO closure in 2019 after the stroke Patient had embolic strokes to the left hemisphere in 2022, no shunt seen on TEE and A-fib not seen on Zio patch monitor  Left ICA stenosis Approximate 50% stenosis  of left ICA seen on CT angiogram On initial CTA, intraluminal thrombus was seen in left ICA, which had disappeared on follow-up CTA, suggesting the thrombus had embolized Discussed carotid stenting with Dr. Lester, who will likely perform this on an outpatient basis  Hypertension Home meds: None Stable Blood Pressure Goal: SBP 120-160 for first 24 hours then less than 180   Hyperlipidemia Home meds: Rosuvastatin  20 mg daily, resumed in hospital LDL 62, goal < 70 Continue statin at discharge  Other Stroke Risk Factors PFO-closure performed in 2019, no shunt seen on recent echo   Other Active Problems Subconjunctival hemorrhage and periorbital bruising after recent blepharoplasty, ophthalmology consulted and continue erythromycin  ophthalmic ointment  Hospital day # 1  Patient seen by NP with MD, MD to edit note as needed. Cortney E Everitt Clint Kill , MSN, AGACNP-BC [facility] See Amion for schedule and pager information 10/23/2024 11:50 AM   I have personally obtained history,examined this patient, reviewed notes,  independently viewed imaging studies, participated in medical decision making and plan of care.ROS completed by me personally and pertinent positives fully documented  I have made any additions or clarifications directly to the above note. Agree with note above.   I personally spent a total of 50 minutes in the care of the patient today including getting/reviewing separately obtained history, performing a medically appropriate exam/evaluation, counseling and educating, placing orders, referring and communicating with other health care professionals, documenting clinical information in the EHR, independently interpreting results, and coordinating care.         Eather Popp, MD Medical Director [facility] Pager: [phone] 10/23/2024 2:06 PM   To contact Stroke Continuity provider, please refer to [email or web address]. After hours, contact General Neurology

## 2024-10-23 NOTE — Telephone Encounter (Signed)
 Returned called to patient.  He updated me of his recent stroke presentation and hospital stay.  LICA revascularization is being considered.  He is scheduled to see me for follow-up on 1/26.  All questions answered.

## 2024-10-23 NOTE — Progress Notes (Signed)
 Steve [name] is doing much better today.  He is alert and oriented and his language is normal.  There is no right-sided weakness involving the arm, hand or leg.  His sensation is grossly normal.  He does have a slight right facial droop.  In reviewing his history, he has had 2 previous infarcts on the left, both with a pattern suggestive of carotid artery embolization.  He has had an extensive evaluation for atrial fibrillation, which was negative, and had closure of a small PFO.  I reviewed his MRI and there are multiple small embolic strokes in the left hemisphere, a pattern that is highly correlated with embolization from atherosclerotic disease.  Despite the relatively mild degree of his stenosis, at most 50%, I think the left carotid artery is the source of embolization.  I have talked to him about the diagnosis.  In the previous episodes, he was treated with aspirin  and clopidogrel  for about a month, and then just aspirin  alone, which she was taking prior to this admission.  My recommendation is that he have carotid artery revascularization.  I talked to him about the options of carotid endarterectomy and revascularization with carotid artery stent procedures.  Given the nature of the lesion, I think carotid stenting is a good option.  I also think stenting with a mesh covered stent may offer some advantage over a bare-metal stent in this type of situation.  My recommendation is dual antiplatelet therapy.  I will follow-up with him in the office in 1 month to discuss carotid revascularization in further detail.

## 2024-10-23 NOTE — Progress Notes (Signed)
 The patient is transferred from 4 N ICU to 3 W 30. He's A & O x 4.  The patient and his wife oriented to the staff and went over the plan of care for the night. They verbalized understanding. Will continue to monitor.

## 2024-10-23 NOTE — Telephone Encounter (Signed)
 Pt called in this morning and he would like a return call back to discuss he had a stroke yesterday and he is in the hospital at this time. Pt has some questions so he wants a call. Thanks

## 2024-10-23 NOTE — Progress Notes (Signed)
 OT Cancellation Note  Patient Details Name: Steve [name] MRN: [id number] DOB: 10-18-1950   Cancelled Treatment:    Reason Eval/Treat Not Completed: Active bedrest order  Ely Molt 10/23/2024, 5:54 AM

## 2024-10-23 NOTE — Progress Notes (Signed)
 Carotid artery doppler has been completed.  Results can be found in chart review under CV Proc.  10/23/2024 4:43 PM  Jailyne Chieffo Elden Appl, RVT.

## 2024-10-24 ENCOUNTER — Other Ambulatory Visit: Payer: Self-pay | Admitting: Neurology

## 2024-10-24 DIAGNOSIS — I639 Cerebral infarction, unspecified: Secondary | ICD-10-CM

## 2024-10-24 DIAGNOSIS — R29701 NIHSS score 1: Secondary | ICD-10-CM | POA: Diagnosis not present

## 2024-10-24 DIAGNOSIS — I63232 Cerebral infarction due to unspecified occlusion or stenosis of left carotid arteries: Secondary | ICD-10-CM

## 2024-10-24 LAB — BASIC METABOLIC PANEL WITH GFR
Anion gap: 12 (ref 5–15)
BUN: 15 mg/dL (ref 8–23)
CO2: 22 mmol/L (ref 22–32)
Calcium: 8.8 mg/dL — ABNORMAL LOW (ref 8.9–10.3)
Chloride: 103 mmol/L (ref 98–111)
Creatinine, Ser: 0.85 mg/dL (ref 0.61–1.24)
GFR, Estimated: >60 mL/min
Glucose, Bld: 100 mg/dL — ABNORMAL HIGH (ref 70–99)
Potassium: 4 mmol/L (ref 3.5–5.1)
Sodium: 137 mmol/L (ref 135–145)

## 2024-10-24 LAB — CBC
HCT: 39.7 % (ref 39.0–52.0)
Hemoglobin: 14 g/dL (ref 13.0–17.0)
MCH: 32.2 pg (ref 26.0–34.0)
MCHC: 35.3 g/dL (ref 30.0–36.0)
MCV: 91.3 fL (ref 80.0–100.0)
Platelets: 173 K/uL (ref 150–400)
RBC: 4.35 MIL/uL (ref 4.22–5.81)
RDW: 12.4 % (ref 11.5–15.5)
WBC: 9.5 K/uL (ref 4.0–10.5)
nRBC: 0 % (ref 0.0–0.2)

## 2024-10-24 LAB — HEMOGLOBIN A1C
Hgb A1c MFr Bld: 5.8 % — ABNORMAL HIGH (ref 4.8–5.6)
Mean Plasma Glucose: 119.76 mg/dL

## 2024-10-24 MED ORDER — STUDY - LIBREXIA-STROKE - MILVEXIAN 25 MG OR PLACEBO TABLET (PI-SETHI): 1.0000 | ORAL_TABLET | Freq: Two times a day (BID) | ORAL | 0 refills | Status: AC

## 2024-10-24 MED ORDER — ASPIRIN 81 MG PO CHEW: 81.0000 mg | CHEWABLE_TABLET | Freq: Every day | ORAL | 3 refills | Status: AC

## 2024-10-24 MED ORDER — CLOPIDOGREL BISULFATE 75 MG PO TABS: 75.0000 mg | ORAL_TABLET | Freq: Every day | ORAL | 2 refills | Status: AC

## 2024-10-24 NOTE — Evaluation (Signed)
 Occupational Therapy Evaluation Patient Details Name: Steve [name] MRN: [id number] DOB: January 16, 1951 Today's Date: 10/24/2024   History of Present Illness   Pt is a 74 y/o male presenting 1/15 with R hand and face numbness and weakness.  MRI shows scattered small acute left infarcts.  Pt s/p TNK and embolectomy.  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 Admitted for above and presents with problem list below, including mildly decreased FMC of R UE. Noted L UE recent shoulder replacement with limited shoulder range and lifting restriction per pt, and recent eye surgery with edema/bruising/bleeding but only having blurry vision. Pt completing mobility and transfers with modified independence, ADLs with modified independence.  Pt educated on [facility] exercises to R UE and provided handout.  He does have some difficulty sequencing but self correcting errors when dual tasking. Encouraged pt to have spouse assist with med mgmts, IADLs and driving at dc- pt and spouse verbalize understanding and agreeable.  Will follow acutely, do not anticipate OT needs after dc home.      If plan is discharge home, recommend the following:   Assistance with cooking/housework;Assist for transportation;Help with stairs or ramp for entrance;Direct supervision/assist for medications management;Direct supervision/assist for financial management     Functional Status Assessment   Patient has had a recent decline in their functional status and demonstrates the ability to make significant improvements in function in a reasonable and predictable amount of time.     Equipment Recommendations   None recommended by OT     Recommendations for Other Services         Precautions/Restrictions   Precautions Precautions: Fall Recall of Precautions/Restrictions: Intact Restrictions Weight Bearing  Restrictions Per Provider Order: No     Mobility Bed Mobility Overal bed mobility: Modified Independent                  Transfers Overall transfer level: Modified independent                        Balance Overall balance assessment: Needs assistance Sitting-balance support: No upper extremity supported Sitting balance-Leahy Scale: Good     Standing balance support: No upper extremity supported, During functional activity Standing balance-Leahy Scale: Good                             ADL either performed or assessed with clinical judgement   ADL Overall ADL's : Modified independent                                             Vision   Additional Comments: recent eye surgery with TNK causing bleeding in eyes per pt/spouse.  With brusing/bleeding red eyes and reports blurry vision but otherwise functional     Perception         Praxis         Pertinent Vitals/Pain Pain Assessment Pain Assessment: No/denies pain     Extremity/Trunk Assessment Upper Extremity Assessment Upper Extremity Assessment: RUE deficits/detail;LUE deficits/detail;Right hand dominant RUE Deficits / Details: mild weakness noted, decreased FMC but functional RUE Coordination: decreased fine motor LUE Deficits / Details: hx of recent L shoulder replacement, reports lifiting restriction and able to [facility]  to 90*. Pt has been seeing therapy for this. LUE Coordination: decreased gross motor   Lower Extremity Assessment Lower Extremity Assessment: Defer to PT evaluation   Cervical / Trunk Assessment Cervical / Trunk Assessment: Normal   Communication Communication Communication: No apparent difficulties   Cognition Arousal: Alert Behavior During Therapy: WFL for tasks assessed/performed Cognition: Cognition impaired           Executive functioning impairment (select all impairments): Sequencing OT - Cognition Comments: slow processing. dual  tasking with increased time and self correcting errors with sequencing months/counting in reverse                 Following commands: Intact       Cueing  General Comments   Cueing Techniques: Verbal cues  VSS   Exercises     Shoulder Instructions      Home Living Family/patient expects to be discharged to:: Private residence Living Arrangements: Spouse/significant other Available Help at Discharge: Family;Available 24 hours/day;Available PRN/intermittently Type of Home: House Home Access: Stairs to enter [employer] of Steps: 2 Entrance Stairs-Rails: Right;Left Home Layout: Multi-level Alternate Level Stairs-Number of Steps: flight Alternate Level Stairs-Rails: Right;Left Bathroom Shower/Tub: Producer, Television/film/video: Standard     Home Equipment: Cane - single point;Shower seat - built in          Prior Functioning/Environment Prior Level of Function : Independent/Modified Independent;Driving (just retired)                    OT Problem List: Decreased strength;Decreased knowledge of precautions;Decreased knowledge of use of DME or AE;Decreased coordination   OT Treatment/Interventions: Self-care/ADL training;Neuromuscular education;DME and/or AE instruction;Therapeutic activities;Patient/family education;Balance training      OT Goals(Current goals can be found in the care plan section)   Acute Rehab OT Goals Patient Stated Goal: home OT Goal Formulation: With patient Time For Goal Achievement: 11/07/24 Potential to Achieve Goals: Good   OT Frequency:  Min 2X/week    Co-evaluation              AM-PAC OT 6 Clicks Daily Activity     Outcome Measure Help from another person eating meals?: None Help from another person taking care of personal grooming?: None Help from another person toileting, which includes using toliet, bedpan, or urinal?: None Help from another person bathing (including washing, rinsing,  drying)?: None Help from another person to put on and taking off regular upper body clothing?: None Help from another person to put on and taking off regular lower body clothing?: None 6 Click Score: 24   End of Session Nurse Communication: Mobility status  Activity Tolerance: Patient tolerated treatment well Patient left: in chair;with call bell/phone within reach;with family/visitor present  OT Visit Diagnosis: Other abnormalities of gait and mobility (R26.89);Muscle weakness (generalized) (M62.81)                Time: 9199-9161 OT Time Calculation (min): 38 min Charges:  OT General Charges $OT Visit: 1 Visit OT Evaluation $OT Eval Moderate Complexity: 1 Mod OT Treatments $Self Care/Home Management : 23-37 mins  Steve [name], OT Acute Rehabilitation Services Office [phone] Secure Chat Preferred    Steve [name] [place] 10/24/2024, 9:52 AM

## 2024-10-24 NOTE — Plan of Care (Signed)
" °  Problem: Education: Goal: Knowledge of disease or condition will improve Outcome: Progressing   Problem: Coping: Goal: Will verbalize positive feelings about self Outcome: Progressing Goal: Will identify appropriate support needs Outcome: Progressing   Problem: Health Behavior/Discharge Planning: Goal: Goals will be collaboratively established with patient/family Outcome: Progressing   Problem: Self-Care: Goal: Ability to participate in self-care as condition permits will improve Outcome: Progressing Goal: Ability to communicate needs accurately will improve Outcome: Progressing   Problem: Education: Goal: Knowledge of General Education information will improve Description: Including pain rating scale, medication(s)/side effects and non-pharmacologic comfort measures Outcome: Progressing   Problem: Clinical Measurements: Goal: Ability to maintain clinical measurements within normal limits will improve Outcome: Progressing Goal: Will remain free from infection Outcome: Progressing Goal: Respiratory complications will improve Outcome: Progressing Goal: Cardiovascular complication will be avoided Outcome: Progressing   "

## 2024-10-24 NOTE — Progress Notes (Signed)
 Reviewed AVS, patient expressed understanding of medications, MD follow up reviewed.   Removed IV, Site clean, dry and intact.  CCMD contacted and informed patients is being discharged.  Patient states all belongings brought to the hospital at time of admission are accounted for and packed to take home.  Patient informed and expressed understanding where to pick up discharge medications.  Nursing staff contacted to transport patient to entrance A, where family member was waiting in vehicle to transport home.

## 2024-10-24 NOTE — Discharge Instructions (Addendum)
 [name], you were admitted with right sided facial droop and right sided weakness.  Your symptoms initially improved but then worsened, and you developed aphasia.  You were given TNK to treat your stroke, and a mechanical thrombectomy was performed to remove the blood clot causing your stroke.  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 Lester for stenting of this artery.  You enrolled in the Librexia trial and will go home with study medication.  You will need to take aspirin  and Plavix  daily for three months and then Plavix  alone.  Please be seen in the stroke clinic 8 weeks after discharge and follow up with your ophthalmologist.

## 2024-10-24 NOTE — Plan of Care (Signed)
 Ambulating with therapy. Spouse at bedside. Ice pack to bilateral eyes.  Foods and fluids well tolerated.    Problem: Education: Goal: Knowledge of disease or condition will improve Outcome: Progressing   Problem: Coping: Goal: Will verbalize positive feelings about self Outcome: Progressing   Problem: Pain Managment: Goal: General experience of comfort will improve and/or be controlled Outcome: Progressing   Problem: Safety: Goal: Ability to remain free from injury will improve Outcome: Progressing   Problem: Activity: Goal: Ability to return to baseline activity level will improve Outcome: Progressing

## 2024-10-27 ENCOUNTER — Encounter: Payer: Self-pay | Admitting: Internal Medicine

## 2024-10-27 NOTE — Progress Notes (Unsigned)
 "     Subjective:    Patient ID: Steve [name], male    DOB: 1951/03/28, 74 y.o.   MRN: [id number]     HPI Steve [name] is here for follow up from the hospital  1-15 - 1/17 - admitted for acute stroke  He has a history of previous TIA/stroke  - presented to ED with right hand and face numbness and weakness worse than typical.  He also had significant difficulty using his right hand .    Ct head w/o - negative.  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  Found to have subconjunctival hemorrhage ( had blepharoplasty on 1/6) and periorbital bruising - evaluated by ophthalmology - no concerning findings. Clot seen and neurosurgery was going to do a thrombectomy and his symptoms started to get worse-aphasia Had thrombectomy and all symptoms improved  To have L carotid stenting as outpatient  Enrolled in Librexia trial - go home with study medication ASA 81 mg and plavix  75 mg daily x 3 weeks then plavix  alone Echo done  Continued on crestor  20 mg daily, LDL at goal  Discharge to home w/o therapy    Discussed the use of AI scribe software for clinical note transcription with the patient, who gave verbal consent to proceed.  History of Present Illness Steve [name] is a 74 year old male with a history of stroke and TIA who presents with recent stroke symptoms.  He experienced numbness in his right arm upon waking at 5:30 AM, initially attributing it to sleeping on it. The numbness persisted, prompting a call to emergency services. He was transported to the hospital and received clot-busting medication around 6:20 AM. Despite initial treatment, his condition worsened, leading to a surgical intervention to remove a clot.  He has a history  of a TIA in 2017 and a stroke in 2020. The recent stroke occurred in 2026, nearly five years after the previous one. Past evaluations at the [facility] suggested a possible plaque issue in the carotid artery, but no definitive cause was identified at that time. The patient reports that he was told during his recent hospitalization that his left carotid artery has unstable plaque.  Post-surgery, he experienced some weakness in his hand, particularly with tasks requiring dexterity, such as unscrewing bottle caps. He notes improvement with exercises but mentions that his hand is not as strong as it needs to be. He also feels exhausted and has tightness in his neck and back, which he attributes to stress and hospital bed discomfort.  He has a history of sleep apnea, previously addressed with surgery, but has noticed snoring when sleeping on his back. He is currently on double anticoagulation therapy and has been monitoring his glucose levels, which have improved since last fall. His cholesterol levels have been managed with rosuvastatin , but he experienced cramps when an additional medication, Zetia , was added, leading to its discontinuation.  He lives six minutes from the hospital and engages in Pilates, yoga, and gym activities regularly, but has paused these until February due to recent health events.     Medications and allergies reviewed with patient and updated if appropriate.  Medications Ordered Prior to Encounter[1]   Review of Systems  Constitutional:  Positive for fatigue. Negative  for fever.  Eyes:  Negative for visual disturbance.  Respiratory:  Negative for cough, shortness of breath and wheezing.   Cardiovascular:  Negative for chest pain, palpitations and leg swelling.  Musculoskeletal:  Positive for back pain.  Neurological:  Positive for weakness (right hand). Negative for light-headedness and headaches.       Objective:   Vitals:   10/28/24 0909  BP: 132/74  Pulse: 70   Temp: 98 F (36.7 C)  SpO2: 98%   BP Readings from Last 3 Encounters:  10/28/24 132/74  10/24/24 (!) 106/58  05/26/24 130/80   Wt Readings from Last 3 Encounters:  10/28/24 200 lb (90.7 kg)  10/22/24 209 lb 8 oz (95 kg)  05/26/24 194 lb (88 kg)   Body mass index is 25 kg/m.    Physical Exam Constitutional:      General: He is not in acute distress.    Appearance: Normal appearance. He is not ill-appearing.  HENT:     Head: Normocephalic and atraumatic.  Eyes:     Conjunctiva/sclera: Conjunctivae normal.  Cardiovascular:     Rate and Rhythm: Normal rate and regular rhythm.     Heart sounds: Normal heart sounds.  Pulmonary:     Effort: Pulmonary effort is normal. No respiratory distress.     Breath sounds: Normal breath sounds. No wheezing or rales.  Musculoskeletal:     Right lower leg: No edema.     Left lower leg: No edema.  Skin:    General: Skin is warm and dry.     Findings: No rash.  Neurological:     Mental Status: He is alert. Mental status is at baseline.  Psychiatric:        Mood and Affect: Mood normal.        Lab Results  Component Value Date   WBC 9.5 10/24/2024   HGB 14.0 10/24/2024   HCT 39.7 10/24/2024   PLT 173 10/24/2024   GLUCOSE 100 (H) 10/24/2024   CHOL 133 10/23/2024   TRIG 64 10/23/2024   HDL 58 10/23/2024   LDLDIRECT 172.0 07/24/2007   LDLCALC 62 10/23/2024   ALT 23 10/22/2024   AST 24 10/22/2024   NA 137 10/24/2024   K 4.0 10/24/2024   CL 103 10/24/2024   CREATININE 0.85 10/24/2024   BUN 15 10/24/2024   CO2 22 10/24/2024   TSH 2.86 05/26/2024   PSA 2.67 06/14/2010   INR 1.0 10/22/2024   HGBA1C 5.8 (H) 10/24/2024   VAS US  CAROTID Carotid Arterial Duplex Study  Patient Name:  Steve [name]  Date of Exam:   10/23/2024 Medical Rec #: [id number]           Accession #:    [id number] Date of Birth: August 18, 1951            Patient Gender: M Patient Age:   58 years Exam Location:  [facility] Procedure:      VAS US   CAROTID Referring Phys: NANCYANN HECK  --------------------------------------------------------------------------------   Indications:       Numbness, Weakness and Carotid stenosis. Risk Factors:      Hyperlipidemia, past history of smoking, prior CVA. Comparison Study:  No prior exam.  Performing Technologist: Edilia Elden Appl    Examination Guidelines: A complete evaluation includes B-mode imaging, spectral Doppler, color Doppler, and power Doppler as needed of all accessible portions of each vessel. Bilateral testing is considered an integral part of a complete examination. Limited examinations for reoccurring indications may be  performed as noted.    Right Carotid Findings: +----------+--------+--------+--------+-------------------------+--------+           PSV cm/sEDV cm/sStenosisPlaque Description       Comments +----------+--------+--------+--------+-------------------------+--------+ CCA Prox  137     23                                                +----------+--------+--------+--------+-------------------------+--------+ CCA Distal107     20              heterogenous and calcific         +----------+--------+--------+--------+-------------------------+--------+ ICA Prox  116     32      1-39%   heterogenous and calcific         +----------+--------+--------+--------+-------------------------+--------+ ICA Mid   122     31      1-39%                                     +----------+--------+--------+--------+-------------------------+--------+ ICA Distal103     27                                                +----------+--------+--------+--------+-------------------------+--------+ ECA       178     14                                                +----------+--------+--------+--------+-------------------------+--------+  +----------+--------+-------+----------------+-------------------+           PSV cm/sEDV  cmsDescribe        Arm Pressure (mmHG) +----------+--------+-------+----------------+-------------------+ Subclavian127     19     Multiphasic, WNL                    +----------+--------+-------+----------------+-------------------+  +---------+--------+--+--------+-+---------+ VertebralPSV cm/s47EDV cm/s9Antegrade +---------+--------+--+--------+-+---------+  Elevated velocities noted in the proximal carotid artery with no obvious signs of stenosis.  Left Carotid Findings: +----------+--------+--------+--------+-------------------------+--------+           PSV cm/sEDV cm/sStenosisPlaque Description       Comments +----------+--------+--------+--------+-------------------------+--------+ CCA Prox  216     26                                                +----------+--------+--------+--------+-------------------------+--------+ CCA Distal108     23              heterogenous and calcific         +----------+--------+--------+--------+-------------------------+--------+ ICA Prox  107     28              heterogenous and calcific         +----------+--------+--------+--------+-------------------------+--------+ ICA Mid   116     36      1-39%                                     +----------+--------+--------+--------+-------------------------+--------+  ICA Distal92      28                                                +----------+--------+--------+--------+-------------------------+--------+ ECA       139     18                                                +----------+--------+--------+--------+-------------------------+--------+  +----------+--------+--------+----------------+-------------------+           PSV cm/sEDV cm/sDescribe        Arm Pressure (mmHG) +----------+--------+--------+----------------+-------------------+ [id number]     17      Multiphasic, WNL                     +----------+--------+--------+----------------+-------------------+  +---------+--------+--+--------+--+---------+ VertebralPSV cm/s63EDV cm/s13Antegrade +---------+--------+--+--------+--+---------+  Elevated velocities noted in the proximal carotid artery with no obvious signs of stenosis.     Summary: Right Carotid: Velocities in the right ICA are consistent with a 1-39% stenosis.  Left Carotid: Velocities in the left ICA are consistent with a 1-39% stenosis.  Vertebrals:  Bilateral vertebral arteries demonstrate antegrade flow. Subclavians: Normal flow hemodynamics were seen in bilateral subclavian              arteries.  *See table(s) above for measurements and observations.    Electronically signed by Gaile New MD on 10/23/2024 at 10:33:37 PM.      Final   MR BRAIN WO CONTRAST EXAM: MRI BRAIN WITHOUT CONTRAST 10/23/2024 06:06:00 AM  TECHNIQUE: Multiplanar multisequence MRI of the head/brain was performed without the administration of intravenous contrast.  COMPARISON: Head CT and CTA 10/22/2024 and MRI 01/25/2022.  CLINICAL HISTORY: Stroke, follow up.  FINDINGS:  BRAIN AND VENTRICLES: Scattered small acute cortical and subcortical infarcts are present in the left frontal and parietal lobes as well as in the left periatrial white matter (left MCA territory and ACA/MCA and MCA/PCA border zones). There is associated mild cytotoxic edema without mass effect or hemorrhage. Preexisting small chronic left frontoparietal infarcts are again noted. No hydrocephalus or extra-axial fluid collection is evident. A chronic microhemorrhage is noted in the left external capsule. T2 hyperintensities elsewhere in the cerebral white matter bilaterally are similar to the prior MRI and are nonspecific but compatible with mild chronic small vessel ischemic disease. Cerebral volume is within normal limits for age. Major intracranial vascular flow voids are  preserved.  ORBITS: No significant abnormality.  SINUSES AND MASTOIDS: No significant abnormality.  BONES AND SOFT TISSUES: Normal marrow signal. No soft tissue abnormality.  IMPRESSION: 1. Scattered small acute left cerebral infarcts. 2. Mild chronic small vessel ischemic disease.  Electronically signed by: Dasie Hamburg MD 10/23/2024 08:16 AM EST RP Workstation: [id number]    Assessment & Plan:    See Problem List for Assessment and Plan of chronic medical problems.   Follow-up annually    [1]  Current Outpatient Medications on File Prior to Visit  Medication Sig Dispense Refill   aspirin  81 MG chewable tablet Chew 1 tablet (81 mg total) by mouth daily. 30 tablet 3   botulinum toxin Type A (BOTOX) 100 units SOLR injection Inject 100 Units into the muscle every 3 (three) months.     cholecalciferol (VITAMIN D3)  25 MCG (1000 UNIT) tablet Take 1,000 Units by mouth daily.     clopidogrel  (PLAVIX ) 75 MG tablet Take 1 tablet (75 mg total) by mouth daily. 30 tablet 2   fluticasone  (FLONASE ) 50 MCG/ACT nasal spray ONE SPRAY IN EACH NOSTRIL TWICE DAILY ASNEEDED -USE CROSSOVER TECHNIQUE DISCUSSED 16 g 0   Multiple Vitamins-Minerals (PRESERVISION AREDS PO) Take 1 capsule by mouth 2 (two) times daily.     MYRBETRIQ 50 MG TB24 tablet Take 50 mg by mouth daily.     neomycin-polymyxin b-dexamethasone  (MAXITROL) 3.5-10000-0.1 OINT Place 1 Application into both eyes 3 (three) times daily.     pantoprazole  (PROTONIX ) 40 MG tablet TAKE ONE TABLET BY MOUTH EVERY DAY 90 tablet 3   Polyethyl Glycol-Propyl Glycol (SYSTANE) 0.4-0.3 % GEL ophthalmic gel Place 1 application into both eyes at bedtime.     rosuvastatin  (CRESTOR ) 20 MG tablet TAKE ONE TABLET DAILY 90 tablet 3   sildenafil (VIAGRA) 100 MG tablet Take 100 mg by mouth as needed for erectile dysfunction.     silodosin (RAPAFLO) 8 MG CAPS capsule Take 8 mg by mouth every evening.     Study - LIBREXIA-STROKE - milvexian 25 mg or placebo  tablet (PI-Sethi) Take 1 tablet by mouth 2 (two) times daily. For Investigational Use Only. For questions please contact [facility]. Bring back all bottles to research visit. 70 tablet 0   traMADol (ULTRAM) 50 MG tablet Take 50 mg by mouth 4 (four) times daily as needed.     vardenafil (LEVITRA) 20 MG tablet Take 20 mg by mouth daily as needed for erectile dysfunction.     vitamin B-12 (CYANOCOBALAMIN ) 1000 MCG tablet Take 1,000 mcg by mouth daily.     vitamin C (ASCORBIC ACID) 500 MG tablet Take 500 mg by mouth daily.     No current facility-administered medications on file prior to visit.   "

## 2024-10-28 ENCOUNTER — Ambulatory Visit: Admitting: Internal Medicine

## 2024-10-28 VITALS — BP 132/74 | HR 70 | Temp 98.0°F | Ht 75.0 in | Wt 200.0 lb

## 2024-10-28 DIAGNOSIS — E782 Mixed hyperlipidemia: Secondary | ICD-10-CM

## 2024-10-28 DIAGNOSIS — E538 Deficiency of other specified B group vitamins: Secondary | ICD-10-CM

## 2024-10-28 DIAGNOSIS — K219 Gastro-esophageal reflux disease without esophagitis: Secondary | ICD-10-CM

## 2024-10-28 DIAGNOSIS — R7303 Prediabetes: Secondary | ICD-10-CM | POA: Diagnosis not present

## 2024-10-28 DIAGNOSIS — I6930 Unspecified sequelae of cerebral infarction: Secondary | ICD-10-CM | POA: Diagnosis not present

## 2024-10-28 NOTE — Assessment & Plan Note (Signed)
 Chronic Lab Results  Component Value Date   LDLCALC 62 10/23/2024    Continue crestor  20 mg  Sees cardiology soon-May need to consider Repatha

## 2024-10-28 NOTE — Assessment & Plan Note (Signed)
 History of CVA Deficit in fine motor skills and right hand-started with stroke in 2020 and worsened after recent stroke ( 2026) Continue Crestor  20 mg, aspirin  81 mg daily plus Plavix  75 mg-after 3 months will do Plavix  75 mg alone On stroke study-librexia with Dr. Rosemarie Has follow-up with neurology, cardiology and neurosurgery Will likely have carotid stent Will continue to do hand PT at home

## 2024-10-28 NOTE — Assessment & Plan Note (Signed)
Chronic Continue B12 supplementation 

## 2024-10-28 NOTE — Patient Instructions (Signed)
        Medications changes include :   None

## 2024-10-28 NOTE — Assessment & Plan Note (Signed)
Chronic GERD controlled Continue pantoprazole 40 mg daily given history of esophageal stricture

## 2024-10-28 NOTE — Assessment & Plan Note (Signed)
 Chronic Lab Results  Component Value Date   HGBA1C 5.8 (H) 10/24/2024   A1c checked during this hospitalization and improved compared to last fall Continue low sugar / carb diet Continue regular exercise

## 2024-11-02 ENCOUNTER — Ambulatory Visit: Admitting: Neurology

## 2024-11-04 NOTE — Progress Notes (Unsigned)
 "  Patient ID: Steve [name], male   DOB: [birth year], 74 y.o.   MRN: [id number]      Cardiology Office Note    Date:  11/05/2024   ID:  Steve [name], DOB [birth year], MRN [id number]  PCP:  Geofm Glade PARAS, MD  Cardiologist:   Jerel Balding, MD   Chief Complaint  Patient presents with   Cerebrovascular Accident    History of Present Illness:  Steve [name] is a 74 y.o. male with history of previous stroke, s/p PFO closure (large PFO with atrial septal aneurysm Amplatzer 25 mm, Dr. Wonda,  in June 2019) , with subsequent recurrent left MCA stroke 2022 (mild aphasia, resolved) apparently due to unstable left internal carotid artery plaque and another similar recent ischemic stroke on 10/22/2024 (aphasia, right facial droop, right upper extremity weakness, doing temper interruption of antiplatelet therapy for eye surgery).  His previous implantable loop recorder monitoring has not shown evidence of atrial fibrillation during 3 years of monitoring (ILR extracted at end of service).    Extensive evaluation including a visit to the [facility] after his stroke in 2022 suggested that his stroke was due to a unstable plaque in his left carotid artery, with a mild degree of stenosis.  Similar findings, with some progression of stenosis, after his most recent stroke in January 2026.  He was hospitalized 10/22/2024 with a stroke (right hand weakness and right facial weakness, followed by transient aphasia, MRI confirmed scattered small acute left cerebral infarcts mostly in the MCA territory) after having interrupted antiplatelet therapy to undergo eyelid surgery.  He received TNK and underwent interventional radiology procedure which showed the presence of a possible intraluminal thrombus in the left internal carotid artery followed by mechanical thrombectomy.  His neurological deficits improved dramatically.  There is a plan for outpatient left carotid stenting (he has an appointment with Dr.  Lester scheduled 11/11/2024).  He enrolled in the [facility] trial.  He is currently taking aspirin  81 mg daily, clopidogrel  75 mg daily for 12 weeks, to then be followed by clopidogrel  monotherapy.  His aphasia has resolved completely.  He has very subtle residual fine motor skill deficits in his right hand, such as punching the wrong keys when using a computer keyboard.  Also has very subtle weakness in that right hand, such as when trying to unscrew a jar.  He has not had any interval new neurological complaints.  He is getting ready to start exercising again.  He would typically exercise for an hour a day 3 days a week, roughly 50% on the elliptical and 50% light weights.  The patient specifically denies any chest pain at rest or with exertion, dyspnea at rest or with exertion, orthopnea, paroxysmal nocturnal dyspnea, syncope, palpitations, new focal neurological deficits, intermittent claudication, lower extremity edema, unexplained weight gain, cough, hemoptysis or wheezing.   Lipid panel performed during his recent hospitalization showed total cholesterol 133, HDL 58, LDL 62, normal triglycerides.  His hemoglobin A1c was 5.8%, the lowest it has been since 2022.  The echocardiogram during that hospitalization showed LVEF 60 to 65%, a well-seated Amplatzer atrial septal occluder without visible shunt, no serious valve abnormalities.  A March 2025 coronary calcium  score showed average atherosclerotic plaque with calcium  score placing him in the 57th percentile.      Past Medical History:  Diagnosis Date   Arthritis    Right shoulder   Diverticulosis 2013   Moderate    First degree AV block 12/09/2018  noted on EKG   GERD (gastroesophageal reflux disease)    Grade I diastolic dysfunction 09/25/2018   Noted on ECHO    Hemifacial spasm    History of loop recorder    Hx of colonic polyp 2008 & 2013   X2   Hyperlipidemia    Internal hemorrhoids 2013   PFO (patent foramen ovale)    History  of    Prostate cancer (HCC)    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   Sleep apnea    Resolved   Squamous cell skin cancer    Dr.King [place]   Stricture esophagus 2013    dilation X 1   Stroke (HCC) 2016   left semiovale stroke    TIA (transient ischemic attack) 01/2015   Ulcerative esophagitis 2013    Past Surgical History:  Procedure Laterality Date   BUBBLE STUDY  08/11/2021   Procedure: BUBBLE STUDY;  Surgeon: Raford Riggs, MD;  Location: [facility] ENDOSCOPY;  Service: Cardiovascular;;   COLONOSCOPY W/ POLYPECTOMY  2013, 2018 last done   tubular adenoma   COLONOSCOPY W/ POLYPECTOMY  2008   hyperplastic   CYSTOSCOPY N/A 09/19/2020   Procedure: CYSTOSCOPY FLEXIBLE;  Surgeon: Matilda Senior, MD;  Location: [facility];  Service: Urology;  Laterality: N/A;   EP IMPLANTABLE DEVICE N/A 01/03/2016   Procedure: Loop Recorder Insertion;  Surgeon: Jerel Balding, MD;  Location: [facility] INVASIVE CV LAB;  Service: Cardiovascular;  Laterality: N/A;   IR PERCUTANEOUS ART THROMBECTOMY/INFUSION INTRACRANIAL INC DIAG ANGIO  10/22/2024   IR US  GUIDE VASC ACCESS RIGHT  10/22/2024   MOHS SURGERY     x 3   NASAL SINUS SURGERY  15 yrs ago   PATENT FORAMEN OVALE(PFO) CLOSURE N/A 03/27/2018   Procedure: PATENT FORAMEN OVALE (PFO) CLOSURE;  Surgeon: Wonda Sharper, MD;  Location: [facility] INVASIVE CV LAB;  Service: Cardiovascular;  Laterality: N/A;   PROSTATE BIOPSY  2021   RADIOACTIVE SEED IMPLANT N/A 09/19/2020   Procedure: RADIOACTIVE SEED IMPLANT/BRACHYTHERAPY IMPLANT;  Surgeon: Matilda Senior, MD;  Location: [facility];  Service: Urology;  Laterality: N/A;  90 MINS   RADIOLOGY WITH ANESTHESIA N/A 10/22/2024   Procedure: RADIOLOGY WITH ANESTHESIA;  Surgeon: Radiologist, Medication, MD;  Location: [facility] OR;  Service: Radiology;  Laterality: N/A;   REVERSE SHOULDER ARTHROPLASTY Right 03/10/2019   Procedure: REVERSE SHOULDER ARTHROPLASTY;  Surgeon: Josefina Chew, MD;   Location: [facility] ORS;  Service: Orthopedics;  Laterality: Right;   ROTATOR CUFF REPAIR Left yrs ago   SPACE OAR INSTILLATION N/A 09/19/2020   Procedure: SPACE OAR INSTILLATION;  Surgeon: Matilda Senior, MD;  Location: [facility];  Service: Urology;  Laterality: N/A;   TEE WITHOUT CARDIOVERSION N/A 01/06/2018   Procedure: TRANSESOPHAGEAL ECHOCARDIOGRAM (TEE);  Surgeon: Balding Jerel, MD;  Location: [facility] ENDOSCOPY;  Service: Cardiovascular;  Laterality: N/A;   TEE WITHOUT CARDIOVERSION N/A 08/11/2021   Procedure: TRANSESOPHAGEAL ECHOCARDIOGRAM (TEE);  Surgeon: Raford Riggs, MD;  Location: [facility] ENDOSCOPY;  Service: Cardiovascular;  Laterality: N/A;   torn biceps tendon Left yrs ago   surgical repair   uvulectomy & tonsillectomy  2008   Dr Mable    Outpatient Medications Prior to Visit  Medication Sig Dispense Refill   aspirin  81 MG chewable tablet Chew 1 tablet (81 mg total) by mouth daily. 30 tablet 3   botulinum toxin Type A (BOTOX) 100 units SOLR injection Inject 100 Units into the muscle every 3 (three) months.     cholecalciferol (VITAMIN D3) 25 MCG (  1000 UNIT) tablet Take 1,000 Units by mouth daily.     clopidogrel  (PLAVIX ) 75 MG tablet Take 1 tablet (75 mg total) by mouth daily. 30 tablet 2   Multiple Vitamins-Minerals (PRESERVISION AREDS PO) Take 1 capsule by mouth 2 (two) times daily.     MYRBETRIQ 50 MG TB24 tablet Take 50 mg by mouth daily.     pantoprazole  (PROTONIX ) 40 MG tablet TAKE ONE TABLET BY MOUTH EVERY DAY 90 tablet 3   Polyethyl Glycol-Propyl Glycol (SYSTANE) 0.4-0.3 % GEL ophthalmic gel Place 1 application into both eyes at bedtime.     rosuvastatin  (CRESTOR ) 20 MG tablet TAKE ONE TABLET DAILY 90 tablet 3   silodosin (RAPAFLO) 8 MG CAPS capsule Take 8 mg by mouth every evening.     Study - LIBREXIA-STROKE - milvexian 25 mg or placebo tablet (PI-Sethi) Take 1 tablet by mouth 2 (two) times daily. For Investigational Use Only. For questions please contact  [facility]. Bring back all bottles to research visit. 70 tablet 0   vitamin B-12 (CYANOCOBALAMIN ) 1000 MCG tablet Take 1,000 mcg by mouth daily.     vitamin C (ASCORBIC ACID) 500 MG tablet Take 500 mg by mouth daily.     fluticasone  (FLONASE ) 50 MCG/ACT nasal spray ONE SPRAY IN EACH NOSTRIL TWICE DAILY ASNEEDED -USE CROSSOVER TECHNIQUE DISCUSSED (Patient not taking: Reported on 11/05/2024) 16 g 0   sildenafil (VIAGRA) 100 MG tablet Take 100 mg by mouth as needed for erectile dysfunction. (Patient not taking: Reported on 11/05/2024)     traMADol (ULTRAM) 50 MG tablet Take 50 mg by mouth 4 (four) times daily as needed. (Patient not taking: Reported on 11/05/2024)     vardenafil (LEVITRA) 20 MG tablet Take 20 mg by mouth daily as needed for erectile dysfunction.     neomycin-polymyxin b-dexamethasone  (MAXITROL) 3.5-10000-0.1 OINT Place 1 Application into both eyes 3 (three) times daily.     No facility-administered medications prior to visit.     Allergies:   Bee pollen and Pollen extract       Family History:  The patient's family history includes Breast cancer in his mother; Colon cancer in his mother; Colon polyps in his mother; Diabetes in his daughter; Healthy in his brother; Heart attack in his maternal grandfather; Hyperlipidemia in his son; Prostate cancer in his father; Stroke in his father and paternal uncle; Uterine cancer in his mother.   ROS:   Please see the history of present illness.    ROS All other systems are reviewed and are negative   PHYSICAL EXAM:   VS:  BP 131/87   Pulse 82   Ht 6' 3 (1.905 m)   Wt 207 lb (93.9 kg)   SpO2 95%   BMI 25.87 kg/m       General: Alert, oriented x3, no distress Head: Periorbital ecchymosis have almost completely resolved.  No evidence of trauma, PERRL, EOMI, no exophtalmos or lid lag, no myxedema, no xanthelasma; normal ears, nose and oropharynx Neck: normal jugular venous pulsations and no hepatojugular reflux;  brisk carotid pulses without delay and no carotid bruits Chest: clear to auscultation, no signs of consolidation by percussion or palpation, normal fremitus, symmetrical and full respiratory excursions Cardiovascular: normal position and quality of the apical impulse, regular rhythm, normal first and second heart sounds, no murmurs, rubs or gallops Abdomen: no tenderness or distention, no masses by palpation, no abnormal pulsatility or arterial bruits, normal bowel sounds, no hepatosplenomegaly Extremities: no clubbing, cyanosis or edema; 2+  radial, ulnar and brachial pulses bilaterally; 2+ right femoral, posterior tibial and dorsalis pedis pulses; 2+ left femoral, posterior tibial and dorsalis pedis pulses; no subclavian or femoral bruits Neurological: Mild right facial droop, otherwise grossly nonfocal Psych: Normal mood and affect     Wt Readings from Last 3 Encounters:  11/05/24 207 lb (93.9 kg)  10/28/24 200 lb (90.7 kg)  10/22/24 209 lb 8 oz (95 kg)      Studies/Labs Reviewed:   TEE 08/11/2021:  1. Left ventricular ejection fraction, by estimation, is 60 to 65%. The  left ventricle has normal function. The left ventricle has no regional  wall motion abnormalities.   2. Right ventricular systolic function is normal. The right ventricular  size is normal.   3. No left atrial/left atrial appendage thrombus was detected.   4. There is an Amplatzer closure device in place with no evidence of  atrial shunting or thrombus. Agitated saline contrast bubble study was  negative, with no evidence of any interatrial shunt.   5. The mitral valve is normal in structure. Trivial mitral valve  regurgitation. No evidence of mitral stenosis.   6. The aortic valve is tricuspid. Aortic valve regurgitation is not  visualized. No aortic stenosis is present.   7. The inferior vena cava is normal in size with greater than 50%  respiratory variability, suggesting right atrial pressure of 3 mmHg.    Conclusion(s)/Recommendation(s): Normal biventricular function without  evidence of hemodynamically significant valvular heart disease.   Transthoracic echo 10/22/2024  1. Amplatzer closure device noted with no obvious shunt.   2. Left ventricular ejection fraction, by estimation, is 60 to 65%. The  left ventricle has normal function. The left ventricle has no regional  wall motion abnormalities. There is mild left ventricular hypertrophy.  Left ventricular diastolic parameters  were normal.   3. Right ventricular systolic function is normal. The right ventricular  size is normal.   4. The mitral valve is normal in structure. No evidence of mitral valve  regurgitation. No evidence of mitral stenosis.   5. The aortic valve is tricuspid. Aortic valve regurgitation is not  visualized. No aortic stenosis is present.   6. The inferior vena cava is normal in size with greater than 50%  respiratory variability, suggesting right atrial pressure of 3 mmHg.   EKG:    EKG Interpretation Date/Time:  Thursday November 05 2024 09:46:30 EST Ventricular Rate:  82 PR Interval:  250 QRS Duration:  88 QT Interval:  370 QTC Calculation: 432 R Axis:   -44  Text Interpretation: Sinus rhythm with 1st degree A-V block Left axis deviation When compared with ECG of 08-Apr-2024 09:08, No significant change was found Confirmed by Tashanti Dalporto ([id number]) on 11/05/2024 5:14:06 PM        Recent Labs: 05/26/2024: TSH 2.86 10/22/2024: ALT 23 10/24/2024: BUN 15; Creatinine, Ser 0.85; Hemoglobin 14.0; Platelets 173; Potassium 4.0; Sodium 137   Lipid Panel    Component Value Date/Time   CHOL 133 10/23/2024 0526   CHOL 144 08/25/2024 0813   CHOL 183 02/07/2015 1535   TRIG 64 10/23/2024 0526   TRIG 81 02/07/2015 1535   HDL 58 10/23/2024 0526   HDL 48 08/25/2024 0813   HDL 63 02/07/2015 1535   CHOLHDL 2.3 10/23/2024 0526   VLDL 13 10/23/2024 0526   LDLCALC 62 10/23/2024 0526   LDLCALC 77 08/25/2024 0813    LDLCALC 104 (H) 02/07/2015 1535   LDLDIRECT 172.0 07/24/2007 0000    ASSESSMENT:  1. Left acute arterial ischemic stroke, MCA (middle cerebral artery) (HCC)   2. Left carotid artery stenosis   3. History of surgical closure of patent foramen ovale (PFO)   4. Hypercholesterolemia   5. Prediabetes        PLAN:  In order of problems listed above:   Recent stroke/left carotid artery stenosis: Recent atheroembolic stroke 10/22/2024.  Plan to meet with Dr. Lester to discuss carotid stenting (covered stent?) next week.  On aspirin  plus clopidogrel , with a plan to discontinue the aspirin  after 12 weeks.  Also enrolled in a double-blind clinical trial of milvexian (factor XIa inhibitor). PFO s/p closure 2019: Recent echo again confirmed that this is a well sealed PFO.  MRI safe device.  Does not require endocarditis prophylaxis anymore. HLP/prediabetes: All lipid parameters are in target range and glucose levels have improved, barely in prediabetes level now.  Because of his recurrent thromboembolic events, we are trying to get his LDL cholesterol down to less than 55, but have been reluctant to increase his rosuvastatin  to avoid worsening glycemic control.  He may have had some muscular side effects on taking Zetia  years ago, but he is willing to give it another try.  Will start Zetia  10 mg daily and repeat a lipid profile in 2-3 months.      Medication Adjustments/Labs and Tests Ordered: Current medicines are reviewed at length with the patient today.  Concerns regarding medicines are outlined above.  Medication changes, Labs and Tests ordered today are listed in the Patient Instructions below. Patient Instructions  Medication Instructions:  Start Ezetimibe  (Zetia ) 10 mg daily *If you need a refill on your cardiac medications before your next appointment, please call your pharmacy*  Lab Work: Fasting Lipid panel- 2-3 months If you have labs (blood work) drawn today and your tests are  completely normal, you will receive your results only by: MyChart Message (if you have MyChart) OR A paper copy in the mail If you have any lab test that is abnormal or we need to change your treatment, we will call you to review the results.  Testing/Procedures: None ordered  Follow-Up: At [facility], you and your health needs are our priority.  As part of our continuing mission to provide you with exceptional heart care, our providers are all part of one team.  This team includes your primary Cardiologist (physician) and Advanced Practice Providers or APPs (Physician Assistants and Nurse Practitioners) who all work together to provide you with the care you need, when you need it.  Your next appointment:    3-4 months  Provider:   Jerel Balding, MD    We recommend signing up for the patient portal called MyChart.  Sign up information is provided on this After Visit Summary.  MyChart is used to connect with patients for Virtual Visits (Telemedicine).  Patients are able to view lab/test results, encounter notes, upcoming appointments, etc.  Non-urgent messages can be sent to your provider as well.   To learn more about what you can do with MyChart, go to [email or web address].            Signed, Jerel Balding, MD  11/05/2024 5:21 PM    [facility] [address] [place], [place], [place]  [address] Phone: [phone]; Fax: [phone]    "

## 2024-11-05 ENCOUNTER — Encounter: Payer: Self-pay | Admitting: Cardiovascular Disease

## 2024-11-05 ENCOUNTER — Ambulatory Visit: Attending: Cardiovascular Disease | Admitting: Cardiovascular Disease

## 2024-11-05 VITALS — BP 131/87 | HR 82 | Ht 75.0 in | Wt 207.0 lb

## 2024-11-05 DIAGNOSIS — R7303 Prediabetes: Secondary | ICD-10-CM | POA: Insufficient documentation

## 2024-11-05 DIAGNOSIS — I63512 Cerebral infarction due to unspecified occlusion or stenosis of left middle cerebral artery: Secondary | ICD-10-CM | POA: Insufficient documentation

## 2024-11-05 DIAGNOSIS — Z8774 Personal history of (corrected) congenital malformations of heart and circulatory system: Secondary | ICD-10-CM | POA: Insufficient documentation

## 2024-11-05 DIAGNOSIS — I6522 Occlusion and stenosis of left carotid artery: Secondary | ICD-10-CM | POA: Diagnosis present

## 2024-11-05 DIAGNOSIS — E7800 Pure hypercholesterolemia, unspecified: Secondary | ICD-10-CM | POA: Diagnosis present

## 2024-11-05 MED ORDER — EZETIMIBE 10 MG PO TABS: 10.0000 mg | ORAL_TABLET | Freq: Every day | ORAL | 3 refills | Status: DC

## 2024-11-05 NOTE — Patient Instructions (Signed)
 Medication Instructions:  Start Ezetimibe  (Zetia ) 10 mg daily *If you need a refill on your cardiac medications before your next appointment, please call your pharmacy*  Lab Work: Fasting Lipid panel- 2-3 months If you have labs (blood work) drawn today and your tests are completely normal, you will receive your results only by: MyChart Message (if you have MyChart) OR A paper copy in the mail If you have any lab test that is abnormal or we need to change your treatment, we will call you to review the results.  Testing/Procedures: None ordered  Follow-Up: At [facility], you and your health needs are our priority.  As part of our continuing mission to provide you with exceptional heart care, our providers are all part of one team.  This team includes your primary Cardiologist (physician) and Advanced Practice Providers or APPs (Physician Assistants and Nurse Practitioners) who all work together to provide you with the care you need, when you need it.  Your next appointment:    3-4 months  Provider:   Jerel Balding, MD    We recommend signing up for the patient portal called MyChart.  Sign up information is provided on this After Visit Summary.  MyChart is used to connect with patients for Virtual Visits (Telemedicine).  Patients are able to view lab/test results, encounter notes, upcoming appointments, etc.  Non-urgent messages can be sent to your provider as well.   To learn more about what you can do with MyChart, go to [email or web address].

## 2024-11-10 ENCOUNTER — Telehealth: Payer: Self-pay | Admitting: Neuroradiology

## 2024-11-10 NOTE — Telephone Encounter (Signed)
 Left patient VM to change the time of his appt.

## 2024-11-11 ENCOUNTER — Encounter: Payer: Self-pay | Admitting: Neuroradiology

## 2024-11-11 ENCOUNTER — Ambulatory Visit (HOSPITAL_COMMUNITY)
Admission: RE | Admit: 2024-11-11 | Discharge: 2024-11-11 | Disposition: A | Source: Ambulatory Visit | Attending: Neuroradiology | Admitting: Neuroradiology

## 2024-11-11 ENCOUNTER — Encounter: Payer: Self-pay | Admitting: Neurology

## 2024-11-11 ENCOUNTER — Ambulatory Visit: Admitting: Neuroradiology

## 2024-11-11 ENCOUNTER — Ambulatory Visit (INDEPENDENT_AMBULATORY_CARE_PROVIDER_SITE_OTHER): Admitting: Neurology

## 2024-11-11 VITALS — BP 135/83 | HR 73 | Temp 98.0°F | Ht 75.0 in | Wt 205.0 lb

## 2024-11-11 VITALS — BP 113/72 | HR 102 | Ht 75.0 in | Wt 204.0 lb

## 2024-11-11 DIAGNOSIS — I639 Cerebral infarction, unspecified: Secondary | ICD-10-CM | POA: Diagnosis not present

## 2024-11-11 DIAGNOSIS — I6522 Occlusion and stenosis of left carotid artery: Secondary | ICD-10-CM

## 2024-11-11 DIAGNOSIS — I63132 Cerebral infarction due to embolism of left carotid artery: Secondary | ICD-10-CM

## 2024-11-11 DIAGNOSIS — I779 Disorder of arteries and arterioles, unspecified: Secondary | ICD-10-CM | POA: Diagnosis not present

## 2024-11-11 NOTE — Progress Notes (Signed)
 I had the pleasure of meeting with Steve [name] and his wife in the office today.  He most recently suffered a left hemispheric stroke on 10/22/2024 and was treated with thrombolysis and embolectomy of a left M3 branch occlusion.  His imaging shows soft plaque in his carotid bifurcation with about 50% stenosis.  This was actually his third stroke with a carotid embolic pattern on MRI.  His carotid ultrasound today shows soft plaque in the carotid bifurcation with no intraluminal thrombus.  He has been thoroughly evaluated for atrial fibrillation which he does not have, and had a PFO closed for secondary stroke prevention.  The above events have occurred on antiplatelet therapy.  He has had no additional events since discharge from the hospital on aspirin  and clopidogrel .  His other medications include rosuvastatin  20 mg and Zetia  10 mg.  Blood pressure today is 135/83.  His heart rate is 70 and regular.  Lungs are clear. Heart sounds are normal. His language function is normal.  No dysarthria. He has a right facial droop. No drift of the arm or leg.  No sensory loss or inattention.  No ataxia.  Assessment:  Recurrent left hemispheric infarcts due to soft plaque embolism from the left carotid artery.  Recommendation:  I discussed with him again the options of continuing with medical therapy, carotid endarterectomy, and carotid stenting using both the approaches of percutaneous access and cervical access with flow reversal.  I do not recommend continuing medical therapy as he has had multiple events.  I have discussed the advantages and disadvantages of all of the surgical options, and also the potential advantage of using a stent with a porous fabric coating to lessen the chance of embolism during the procedure or afterwards.  He understands the above options and would like to proceed with carotid artery stenting.  We will schedule this in the near future.  He will continue with dual  antiplatelet therapy.

## 2024-11-11 NOTE — Progress Notes (Signed)
 "  Follow-up Visit   Date: 11/11/24    Steve [name] MRN: [id number] DOB: [birth year]   Interim History: Steve [name] is a 74 y.o. 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  History of present illness: On April 23rd 2015, he woke up with right arm heaviness/weakness and right sided paresthesias. There was no weakness of the right lower extremity or the face.  Symptoms resolved within about 6 hours.  He has noticed that his signature and hand writing is showing subtle differences.  He stopped taking aspirin  several years ago and restarted aspirin  81mg  when these symptoms started.  MRI brain showed small left centrum semiovale stroke.  There is no evidence of large vessel occlusion. Echocardiogram is normal except moderate right-to-left shunt without atrial septal aneurysm. He had PFO closure on 03/27/18.  He has seen Dr. Maurice in the late 1990s for hemifacial spasm and gets botox injected.  In 2019, he began having left sided tightness of the thigh . Sometimes, he has difficulty raising the left leg, such as with wearing pants.  There is some associated low back pain.  He has seen [facility] in the past and had MRI lumbar spine which showed degenerative changes at L4-5.  UPDATE 09/13/2021:  He was admitted to [facility] in October with acute onset of word-finding difficulty, which he noticed especially when trying to type emails.  He went to the ER where MRI confirmed scattered left hemisphere embolic stroke.  TEE was stable, no shunt.  Zio patch monitor did not detect atrial fibrillation.  MRA did not show any large vessel stenosis/occlusion.  He had self discontinued aspirin , but was planning on starting it that day.  Since discharge, he has completed 3 weeks of ASA 81mg  +  plavix  75mg  and now on aspirin  81mg  daily.  Statin therapy was also changed to crestor  20mg  which has improved his LDL from 104 down to 61.  He has noticed some improvement, but is not back to baseline. He did seek second opinion at [facility] and was seen by Dr. Sheliah who recommended repeat MRA neck with plaque protocol due to concern of possible plaque at the left carotid bifurcation.  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 He was suggested to increase aspirin  to 325mg  and continue crestor .   UPDATE 09/12/2022:  He is here for follow-up visit.  In April, he had one day of dizziness and unsteadiness.  He had MRI brain which did not show acute changes.  He also had repeat MRA neck plaque protocol at [facility] which showed stable intraplaque hemorrhage.  He was suggested to have repeat scan in 6-7 months.  No interval TIA or stroke.   Over the past several months, he has noticed greater difficulty with writing because of right hand tremor.  Tremor mild but it is bothersome.  No family history of tremors.    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 Because of this pain, he is unable to enjoy outdoor activities, such as golf.  UPDATE 03/18/2023:  He is here for 6 month follow-up visit.  He is doing well and denies any new neurological symptoms.  At his last visit, I started propranolol  20mg  BID for tremor, however, the medication made him tired and wife said he was depressed, so he stopped it.  He continues to have intermittent tremor of the right hand which is bothersome when he is trying to drink coffee or place a golf ball on the tee, but symptoms are not all the time.  His blood pressure is elevated today 147/80 and he tells me that the  last time he saw his cardiology it was elevated too.  He was told to monitor BP for 5 days, but admits that he has not done this.  He is scheduled to have follow-up MRA neck at [facility] next week.  UPDATE 10/21/2023:  He is here for 6 month visit.  He is doing well.  Repeat imaging of the neck at [facility] showed stable left common carotid artery.  Fortunately, he has not had any new neurological symptoms.  His tremor has also become less bothersome and had writing is better than before.  No new complaints.   UPDATE 04/28/2024:  He is here for 6 month visit.  No new complaints today.  He is scheduled to have repeat MRA neck tomorrow and has follow-up with Dr. Sheliah on Thursday.  He is doing well.  He continues to have right hand tremor which mostly bothers him when writing.  He stopped propranolol  because of worsening mood.   UPDATE 11/11/2024: Discussed the use of AI scribe software for clinical note transcription with the patient, who gave verbal consent to proceed.  History of Present Illness He was hospitalized on October 22, 2024 for acute left MCA territory cerebral infarcts, presenting with right hand weakness, numbness, and right facial droop. TKN was administered, followed by development of aphasia and worsening right facial droop, prompting urgent mechanical thrombectomy for a left M3 occlusion. Imaging revealed left M3 occlusion and absence of intraluminal thrombus in the left ICA. He was started on dual antiplatelet therapy and enrolled in a clinical trial. 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 He underwent eyelid surgery on October 13, 2024 for ptosis secondary to long-term Botox use, with postoperative bilateral periorbital ecchymosis and conjunctival injection that have since resolved.   He is approximately three weeks post-acute left MCA embolic stroke, which manifested as right hand weakness, numbness, and right facial droop. He  has experienced substantial neurological recovery, with resolution of right arm and leg weakness and numbness. However, he continues to have mild impairment in fine motor control and dexterity of the right hand, particularly during tasks such as typing and writing, which require increased concentration. He is able to perform daily activities and has resumed exercise routines, including yoga and gym workouts, though overall stamina has not returned to baseline.  Mild right facial droop persists and is perceived as slightly more pronounced than prior to the recent stroke. He denies ongoing numbness, tingling, or weakness in the right arm, face, or leg. No new neurological symptoms have occurred since discharge.  He notes decreased visual acuity in the right eye. He has a history of macular degeneration and a retinal wrinkle. He is scheduled to follow up with ophthalmology and his Botox provider. He currently takes aspirin  81 mg daily (reduced from 325 mg) and clopidogrel  75 mg daily, and is enrolled in the Librexia stroke trial with planned follow-up in the stroke clinic. He is scheduled for carotid  ultrasound and evaluation for left carotid stenting this afternoon with Dr. Lester.   Medications:  Current Outpatient Medications on File Prior to Visit  Medication Sig Dispense Refill   aspirin  81 MG chewable tablet Chew 1 tablet (81 mg total) by mouth daily. 30 tablet 3   botulinum toxin Type A (BOTOX) 100 units SOLR injection Inject 100 Units into the muscle every 3 (three) months.     cholecalciferol (VITAMIN D3) 25 MCG (1000 UNIT) tablet Take 1,000 Units by mouth daily.     clopidogrel  (PLAVIX ) 75 MG tablet Take 1 tablet (75 mg total) by mouth daily. 30 tablet 2   fluticasone  (FLONASE ) 50 MCG/ACT nasal spray ONE SPRAY IN EACH NOSTRIL TWICE DAILY ASNEEDED -USE CROSSOVER TECHNIQUE DISCUSSED 16 g 0   Multiple Vitamins-Minerals (PRESERVISION AREDS PO) Take 1 capsule by mouth 2 (two) times daily.     MYRBETRIQ  50 MG TB24 tablet Take 50 mg by mouth daily.     pantoprazole  (PROTONIX ) 40 MG tablet TAKE ONE TABLET BY MOUTH EVERY DAY 90 tablet 3   Polyethyl Glycol-Propyl Glycol (SYSTANE) 0.4-0.3 % GEL ophthalmic gel Place 1 application into both eyes at bedtime.     rosuvastatin  (CRESTOR ) 20 MG tablet TAKE ONE TABLET DAILY 90 tablet 3   sildenafil (VIAGRA) 100 MG tablet Take 100 mg by mouth as needed for erectile dysfunction.     Study - LIBREXIA-STROKE - milvexian 25 mg or placebo tablet (PI-Sethi) Take 1 tablet by mouth 2 (two) times daily. For Investigational Use Only. For questions please contact [facility]. Bring back all bottles to research visit. 70 tablet 0   traMADol (ULTRAM) 50 MG tablet Take 50 mg by mouth 4 (four) times daily as needed.     vardenafil (LEVITRA) 20 MG tablet Take 20 mg by mouth daily as needed for erectile dysfunction.     vitamin B-12 (CYANOCOBALAMIN ) 1000 MCG tablet Take 1,000 mcg by mouth daily.     vitamin C (ASCORBIC ACID) 500 MG tablet Take 500 mg by mouth daily.     No current facility-administered medications on file prior to visit.    Allergies:  Allergies  Allergen Reactions   Bee Pollen Itching    Eyes itch and water   bee pollen   Pollen Extract Itching    Eyes itch and water      Vital Signs:  BP 113/72   Pulse (!) 102   Ht 6' 3 (1.905 m)   Wt 204 lb (92.5 kg)   SpO2 94%   BMI 25.50 kg/m   Neurological Exam: MENTAL STATUS including orientation to time, place, person, recent and remote memory, attention span and concentration, language, and fund of knowledge is normal.  No dysarthria.   CRANIAL NERVES:   Normal conjugate, extra-ocular eye movements in all directions of gaze. Right facial asymmetry involving the forehead and cheek, asymmetrical smile on the right (due to botox administration for hemifacial spasm).  Moderate right ptosis.  Intermittent right hemifacial spasm .  MOTOR:  Motor strength is 5/5 in all extremities. Fine  tremor of the right hand when outstretched.  No rest tremor.    No pronator drift.  Tone is normal.    SENSORY: Intact vibration throughout  REFLEX: Reflexes are 2+/4 throughout  COORDINATION/GAIT:  Normal finger-to- nose-finger.  Intact rapid alternating movements bilaterally.  Gait narrow based and stable.  Data:  Lab Results  Component Value Date   CHOL 133 10/23/2024   HDL 58 10/23/2024   LDLCALC 62 10/23/2024  LDLDIRECT 172.0 07/24/2007   TRIG 64 10/23/2024   CHOLHDL 2.3 10/23/2024   Lab Results  Component Value Date   HGBA1C 5.8 (H) 10/24/2024   Lab Results  Component Value Date   TSH 2.86 05/26/2024    MRI brain wo contrast 01/25/2022: 1. No recent insult or specific cause for symptoms. 2. Remote left cerebral infarcts.  MRI brain wo contrast 10/23/2024: 1. Scattered small acute left cerebral infarcts. 2. Mild chronic small vessel ischemic disease.  Cerebral angiogram 10/22/2024:   Left frontal lobe M3 branch embolus, TICI 2C after embolectomy   CTA head and neck 10/22/2024: 1. New focal occlusion of an M3 branch of the left middle cerebral artery. 2. CT perfusion demonstrates cerebral blood flow less than 30% measuring 8 mL with a mismatch volume of 15 mL and a mismatch ratio of 2.9 in the left MCA distribution. 3. Interval change in the appearance of calcific and noncalcific plaque within the left carotid bulb, with no longer a focal intraluminal filling defect, suggesting the previous intraluminal thrombus has embolized in the interim. 4. Moderate calcific plaque within the origin of the left internal carotid artery. 5. The above findings were communicated to Dr. Vanessa at 8:55 AM 10/22/24.  IMPRESSION/PLAN: 1.  Acute left MCA territory embolic stroke manifesting with right facial droop, right arm weakness, numbness, and aphasia.  He received TKN and also underwent mechanical thrombectomy for left M3 occlusion with clot felt to be from unstable left ICA  plaque.   He has prior history of left hemispheric stroke (2016) and previously had PFO closure, however, the last two strokes (2022, 2026) are felt to be from left ICA disease.   - Continued aspirin  81 mg daily and clopidogrel  75 mg daily for 12 weeks, then transition to antiplatelet monotherapy per stroke trial protocol. - Continue crestor  20mg   - Encouraged ongoing home-based fine motor and dexterity exercises. - Advised gradual resumption of physical and occupational activities as tolerated. - Follow-up with Dr. Rosemarie at stroke clinic for ongoing management and trial participation. - He is scheduled to see Dr. Lester this afternoon to discuss stenting of left carotid disease  2.  Essential tremor of the right hand, trace.  Previously tried propranolol  (depressed mood).  If symptoms get worse, consider primidone.   3.  Right hemifacial spasm, followed by Dr. Tandy Riding in [place], [place]  Return to clinic in 6 months  Total time spent reviewing records, interview, history/exam, documentation, and coordination of care on day of encounter:  40 minutes    Thank you for allowing me to participate in patient's care.  If I can answer any additional questions, I would be pleased to do so.    Sincerely,    Dink Creps K. Tobie, DO "

## 2025-03-05 ENCOUNTER — Ambulatory Visit: Admitting: Cardiovascular Disease

## 2025-04-27 ENCOUNTER — Ambulatory Visit: Payer: Self-pay | Admitting: Neurology
